# Patient Record
Sex: Male | Born: 1940 | Race: White | Hispanic: No | Marital: Married | Smoking: Current every day smoker
Health system: Southern US, Community
[De-identification: ages and names within clinical notes are randomized; demographics above are authoritative.]

## PROBLEM LIST (undated history)

## (undated) DIAGNOSIS — S2223XA Sternal manubrial dissociation, initial encounter for closed fracture: Secondary | ICD-10-CM

## (undated) DIAGNOSIS — L97519 Non-pressure chronic ulcer of other part of right foot with unspecified severity: Secondary | ICD-10-CM

## (undated) DIAGNOSIS — G629 Polyneuropathy, unspecified: Secondary | ICD-10-CM

## (undated) DIAGNOSIS — E119 Type 2 diabetes mellitus without complications: Secondary | ICD-10-CM

## (undated) DIAGNOSIS — F1020 Alcohol dependence, uncomplicated: Secondary | ICD-10-CM

## (undated) DIAGNOSIS — D709 Neutropenia, unspecified: Secondary | ICD-10-CM

## (undated) DIAGNOSIS — D649 Anemia, unspecified: Secondary | ICD-10-CM

## (undated) DIAGNOSIS — K859 Acute pancreatitis without necrosis or infection, unspecified: Secondary | ICD-10-CM

## (undated) DIAGNOSIS — I739 Peripheral vascular disease, unspecified: Secondary | ICD-10-CM

## (undated) DIAGNOSIS — E785 Hyperlipidemia, unspecified: Secondary | ICD-10-CM

## (undated) DIAGNOSIS — K219 Gastro-esophageal reflux disease without esophagitis: Secondary | ICD-10-CM

## (undated) DIAGNOSIS — K5790 Diverticulosis of intestine, part unspecified, without perforation or abscess without bleeding: Secondary | ICD-10-CM

## (undated) DIAGNOSIS — N189 Chronic kidney disease, unspecified: Secondary | ICD-10-CM

## (undated) DIAGNOSIS — Q359 Cleft palate, unspecified: Secondary | ICD-10-CM

## (undated) DIAGNOSIS — M1990 Unspecified osteoarthritis, unspecified site: Secondary | ICD-10-CM

## (undated) DIAGNOSIS — Z8719 Personal history of other diseases of the digestive system: Secondary | ICD-10-CM

## (undated) DIAGNOSIS — K279 Peptic ulcer, site unspecified, unspecified as acute or chronic, without hemorrhage or perforation: Secondary | ICD-10-CM

## (undated) DIAGNOSIS — J329 Chronic sinusitis, unspecified: Secondary | ICD-10-CM

## (undated) DIAGNOSIS — J189 Pneumonia, unspecified organism: Secondary | ICD-10-CM

## (undated) DIAGNOSIS — J449 Chronic obstructive pulmonary disease, unspecified: Secondary | ICD-10-CM

## (undated) DIAGNOSIS — I2510 Atherosclerotic heart disease of native coronary artery without angina pectoris: Secondary | ICD-10-CM

## (undated) DIAGNOSIS — I10 Essential (primary) hypertension: Secondary | ICD-10-CM

## (undated) DIAGNOSIS — I255 Ischemic cardiomyopathy: Secondary | ICD-10-CM

## (undated) HISTORY — DX: Chronic kidney disease, unspecified: N18.9

## (undated) HISTORY — PX: TYMPANOPLASTY: SHX33

## (undated) HISTORY — PX: CATARACT EXTRACTION W/ INTRAOCULAR LENS IMPLANT: SHX1309

## (undated) HISTORY — DX: Diverticulosis of intestine, part unspecified, without perforation or abscess without bleeding: K57.90

## (undated) HISTORY — PX: APPENDECTOMY: SHX54

## (undated) HISTORY — DX: Chronic sinusitis, unspecified: J32.9

## (undated) HISTORY — DX: Non-pressure chronic ulcer of other part of right foot with unspecified severity: L97.519

## (undated) HISTORY — PX: CHOLECYSTECTOMY: SHX55

## (undated) HISTORY — PX: INGUINAL HERNIA REPAIR: SUR1180

---

## 2001-11-04 HISTORY — PX: ESOPHAGOGASTRODUODENOSCOPY ENDOSCOPY: SHX5814

## 2002-01-19 ENCOUNTER — Inpatient Hospital Stay (HOSPITAL_COMMUNITY): Admission: AD | Admit: 2002-01-19 | Discharge: 2002-01-21 | Payer: Self-pay | Admitting: Cardiology

## 2002-01-19 ENCOUNTER — Encounter: Payer: Self-pay | Admitting: Cardiology

## 2002-01-21 ENCOUNTER — Encounter: Payer: Self-pay | Admitting: Cardiology

## 2002-03-24 ENCOUNTER — Ambulatory Visit (HOSPITAL_COMMUNITY): Admission: RE | Admit: 2002-03-24 | Discharge: 2002-03-24 | Payer: Self-pay | Admitting: Unknown Physician Specialty

## 2002-03-24 ENCOUNTER — Encounter: Payer: Self-pay | Admitting: Unknown Physician Specialty

## 2002-05-04 HISTORY — PX: COLONOSCOPY: SHX174

## 2002-05-12 ENCOUNTER — Ambulatory Visit (HOSPITAL_COMMUNITY): Admission: RE | Admit: 2002-05-12 | Discharge: 2002-05-12 | Payer: Self-pay | Admitting: Internal Medicine

## 2002-05-12 HISTORY — PX: COLONOSCOPY: SHX174

## 2002-05-14 ENCOUNTER — Ambulatory Visit (HOSPITAL_COMMUNITY): Admission: RE | Admit: 2002-05-14 | Discharge: 2002-05-14 | Payer: Self-pay | Admitting: Internal Medicine

## 2002-05-14 ENCOUNTER — Encounter: Payer: Self-pay | Admitting: Internal Medicine

## 2002-12-16 ENCOUNTER — Ambulatory Visit (HOSPITAL_COMMUNITY): Admission: RE | Admit: 2002-12-16 | Discharge: 2002-12-16 | Payer: Self-pay | Admitting: Unknown Physician Specialty

## 2002-12-16 ENCOUNTER — Encounter: Payer: Self-pay | Admitting: Unknown Physician Specialty

## 2003-01-10 ENCOUNTER — Emergency Department (HOSPITAL_COMMUNITY): Admission: EM | Admit: 2003-01-10 | Discharge: 2003-01-10 | Payer: Self-pay | Admitting: Emergency Medicine

## 2003-01-20 ENCOUNTER — Ambulatory Visit (HOSPITAL_COMMUNITY): Admission: RE | Admit: 2003-01-20 | Discharge: 2003-01-20 | Payer: Self-pay | Admitting: Unknown Physician Specialty

## 2003-01-20 ENCOUNTER — Encounter: Payer: Self-pay | Admitting: Unknown Physician Specialty

## 2003-01-27 ENCOUNTER — Ambulatory Visit (HOSPITAL_COMMUNITY): Admission: RE | Admit: 2003-01-27 | Discharge: 2003-01-27 | Payer: Self-pay | Admitting: Unknown Physician Specialty

## 2003-01-27 ENCOUNTER — Encounter: Payer: Self-pay | Admitting: Unknown Physician Specialty

## 2003-03-04 ENCOUNTER — Encounter: Payer: Self-pay | Admitting: Orthopaedic Surgery

## 2003-03-04 ENCOUNTER — Ambulatory Visit (HOSPITAL_COMMUNITY): Admission: RE | Admit: 2003-03-04 | Discharge: 2003-03-04 | Payer: Self-pay | Admitting: Orthopaedic Surgery

## 2003-07-08 ENCOUNTER — Emergency Department (HOSPITAL_COMMUNITY): Admission: EM | Admit: 2003-07-08 | Discharge: 2003-07-08 | Payer: Self-pay | Admitting: Emergency Medicine

## 2003-10-09 ENCOUNTER — Emergency Department (HOSPITAL_COMMUNITY): Admission: EM | Admit: 2003-10-09 | Discharge: 2003-10-09 | Payer: Self-pay | Admitting: Emergency Medicine

## 2003-10-11 ENCOUNTER — Inpatient Hospital Stay (HOSPITAL_COMMUNITY): Admission: EM | Admit: 2003-10-11 | Discharge: 2003-10-15 | Payer: Self-pay | Admitting: Emergency Medicine

## 2004-10-09 ENCOUNTER — Ambulatory Visit: Payer: Self-pay | Admitting: Family Medicine

## 2005-06-24 ENCOUNTER — Emergency Department (HOSPITAL_COMMUNITY): Admission: EM | Admit: 2005-06-24 | Discharge: 2005-06-24 | Payer: Self-pay | Admitting: Emergency Medicine

## 2005-12-16 ENCOUNTER — Ambulatory Visit: Payer: Self-pay | Admitting: Cardiology

## 2005-12-18 ENCOUNTER — Inpatient Hospital Stay (HOSPITAL_BASED_OUTPATIENT_CLINIC_OR_DEPARTMENT_OTHER): Admission: RE | Admit: 2005-12-18 | Discharge: 2005-12-18 | Payer: Self-pay | Admitting: Cardiology

## 2005-12-18 ENCOUNTER — Ambulatory Visit: Payer: Self-pay | Admitting: Cardiology

## 2005-12-25 ENCOUNTER — Ambulatory Visit (HOSPITAL_COMMUNITY): Admission: RE | Admit: 2005-12-25 | Discharge: 2005-12-26 | Payer: Self-pay | Admitting: Cardiology

## 2005-12-25 ENCOUNTER — Ambulatory Visit: Payer: Self-pay | Admitting: Cardiology

## 2006-01-01 ENCOUNTER — Ambulatory Visit: Payer: Self-pay | Admitting: Cardiology

## 2006-07-16 ENCOUNTER — Ambulatory Visit (HOSPITAL_COMMUNITY): Admission: RE | Admit: 2006-07-16 | Discharge: 2006-07-16 | Payer: Self-pay | Admitting: Ophthalmology

## 2006-09-10 ENCOUNTER — Ambulatory Visit (HOSPITAL_COMMUNITY): Admission: RE | Admit: 2006-09-10 | Discharge: 2006-09-10 | Payer: Self-pay | Admitting: Ophthalmology

## 2006-10-02 ENCOUNTER — Ambulatory Visit: Payer: Self-pay | Admitting: Cardiology

## 2006-10-30 ENCOUNTER — Ambulatory Visit: Payer: Self-pay | Admitting: Cardiology

## 2006-12-01 ENCOUNTER — Ambulatory Visit: Payer: Self-pay | Admitting: Cardiology

## 2007-01-22 ENCOUNTER — Encounter: Payer: Self-pay | Admitting: Cardiology

## 2007-05-27 ENCOUNTER — Ambulatory Visit: Payer: Self-pay | Admitting: Cardiology

## 2008-12-14 ENCOUNTER — Encounter: Payer: Self-pay | Admitting: Cardiology

## 2008-12-22 ENCOUNTER — Encounter: Payer: Self-pay | Admitting: Cardiology

## 2009-01-05 ENCOUNTER — Encounter: Payer: Self-pay | Admitting: Cardiology

## 2009-03-16 ENCOUNTER — Encounter: Payer: Self-pay | Admitting: Cardiology

## 2009-06-09 ENCOUNTER — Encounter: Payer: Self-pay | Admitting: Cardiology

## 2009-08-08 ENCOUNTER — Encounter: Payer: Self-pay | Admitting: Cardiology

## 2009-08-12 ENCOUNTER — Encounter: Payer: Self-pay | Admitting: Cardiology

## 2009-09-20 ENCOUNTER — Ambulatory Visit: Payer: Self-pay | Admitting: Cardiology

## 2009-09-21 ENCOUNTER — Inpatient Hospital Stay (HOSPITAL_COMMUNITY): Admission: EM | Admit: 2009-09-21 | Discharge: 2009-09-22 | Payer: Self-pay | Admitting: Cardiovascular Disease

## 2009-09-21 ENCOUNTER — Ambulatory Visit: Payer: Self-pay | Admitting: Cardiovascular Disease

## 2009-09-22 ENCOUNTER — Encounter: Payer: Self-pay | Admitting: Cardiovascular Disease

## 2009-10-20 ENCOUNTER — Ambulatory Visit: Payer: Self-pay | Admitting: Cardiology

## 2009-10-20 DIAGNOSIS — I714 Abdominal aortic aneurysm, without rupture, unspecified: Secondary | ICD-10-CM | POA: Insufficient documentation

## 2009-10-20 DIAGNOSIS — I718 Aortic aneurysm of unspecified site, ruptured: Secondary | ICD-10-CM

## 2009-10-20 DIAGNOSIS — Z9889 Other specified postprocedural states: Secondary | ICD-10-CM

## 2009-10-20 DIAGNOSIS — I259 Chronic ischemic heart disease, unspecified: Secondary | ICD-10-CM

## 2009-10-20 DIAGNOSIS — E1059 Type 1 diabetes mellitus with other circulatory complications: Secondary | ICD-10-CM

## 2009-10-20 DIAGNOSIS — I70219 Atherosclerosis of native arteries of extremities with intermittent claudication, unspecified extremity: Secondary | ICD-10-CM

## 2009-10-30 ENCOUNTER — Encounter: Payer: Self-pay | Admitting: Cardiology

## 2009-11-16 ENCOUNTER — Encounter: Payer: Self-pay | Admitting: Cardiology

## 2009-11-17 ENCOUNTER — Encounter (INDEPENDENT_AMBULATORY_CARE_PROVIDER_SITE_OTHER): Payer: Self-pay | Admitting: *Deleted

## 2009-12-05 ENCOUNTER — Telehealth (INDEPENDENT_AMBULATORY_CARE_PROVIDER_SITE_OTHER): Payer: Self-pay | Admitting: *Deleted

## 2009-12-12 ENCOUNTER — Encounter: Payer: Self-pay | Admitting: Cardiology

## 2010-01-18 ENCOUNTER — Encounter: Payer: Self-pay | Admitting: Cardiology

## 2010-01-18 DIAGNOSIS — E875 Hyperkalemia: Secondary | ICD-10-CM

## 2010-02-05 ENCOUNTER — Encounter: Payer: Self-pay | Admitting: Cardiology

## 2010-02-16 ENCOUNTER — Encounter (INDEPENDENT_AMBULATORY_CARE_PROVIDER_SITE_OTHER): Payer: Self-pay | Admitting: *Deleted

## 2010-04-04 HISTORY — PX: CORONARY ARTERY BYPASS GRAFT: SHX141

## 2010-04-11 ENCOUNTER — Ambulatory Visit: Payer: Self-pay | Admitting: Cardiology

## 2010-04-11 DIAGNOSIS — I739 Peripheral vascular disease, unspecified: Secondary | ICD-10-CM | POA: Insufficient documentation

## 2010-04-13 ENCOUNTER — Encounter: Payer: Self-pay | Admitting: Cardiology

## 2010-04-16 ENCOUNTER — Encounter: Payer: Self-pay | Admitting: Cardiology

## 2010-04-17 ENCOUNTER — Ambulatory Visit: Payer: Self-pay | Admitting: Cardiology

## 2010-04-17 ENCOUNTER — Inpatient Hospital Stay (HOSPITAL_COMMUNITY): Admission: EM | Admit: 2010-04-17 | Discharge: 2010-04-28 | Payer: Self-pay | Admitting: Cardiology

## 2010-04-19 ENCOUNTER — Encounter: Payer: Self-pay | Admitting: Cardiology

## 2010-04-19 ENCOUNTER — Ambulatory Visit: Payer: Self-pay | Admitting: Cardiothoracic Surgery

## 2010-04-19 ENCOUNTER — Encounter (INDEPENDENT_AMBULATORY_CARE_PROVIDER_SITE_OTHER): Payer: Self-pay | Admitting: *Deleted

## 2010-04-19 ENCOUNTER — Encounter: Payer: Self-pay | Admitting: Cardiothoracic Surgery

## 2010-04-20 ENCOUNTER — Encounter: Payer: Self-pay | Admitting: Cardiology

## 2010-04-24 ENCOUNTER — Encounter: Payer: Self-pay | Admitting: Cardiology

## 2010-05-05 ENCOUNTER — Emergency Department (HOSPITAL_COMMUNITY): Admission: EM | Admit: 2010-05-05 | Discharge: 2010-05-05 | Payer: Self-pay | Admitting: Emergency Medicine

## 2010-05-16 ENCOUNTER — Encounter: Payer: Self-pay | Admitting: Cardiology

## 2010-05-16 ENCOUNTER — Encounter: Admission: RE | Admit: 2010-05-16 | Discharge: 2010-05-16 | Payer: Self-pay | Admitting: Cardiothoracic Surgery

## 2010-05-16 ENCOUNTER — Ambulatory Visit: Payer: Self-pay | Admitting: Cardiothoracic Surgery

## 2010-05-18 ENCOUNTER — Ambulatory Visit: Payer: Self-pay | Admitting: Cardiology

## 2010-05-18 DIAGNOSIS — I2589 Other forms of chronic ischemic heart disease: Secondary | ICD-10-CM

## 2010-05-18 DIAGNOSIS — Z951 Presence of aortocoronary bypass graft: Secondary | ICD-10-CM | POA: Insufficient documentation

## 2010-05-18 DIAGNOSIS — J449 Chronic obstructive pulmonary disease, unspecified: Secondary | ICD-10-CM

## 2010-05-25 ENCOUNTER — Encounter: Payer: Self-pay | Admitting: Cardiology

## 2010-05-29 ENCOUNTER — Encounter (INDEPENDENT_AMBULATORY_CARE_PROVIDER_SITE_OTHER): Payer: Self-pay | Admitting: *Deleted

## 2010-05-30 ENCOUNTER — Ambulatory Visit: Payer: Self-pay | Admitting: Cardiothoracic Surgery

## 2010-05-30 ENCOUNTER — Encounter: Payer: Self-pay | Admitting: Cardiology

## 2010-07-04 ENCOUNTER — Telehealth (INDEPENDENT_AMBULATORY_CARE_PROVIDER_SITE_OTHER): Payer: Self-pay | Admitting: *Deleted

## 2010-08-17 ENCOUNTER — Ambulatory Visit: Payer: Self-pay | Admitting: Cardiology

## 2010-08-17 DIAGNOSIS — N62 Hypertrophy of breast: Secondary | ICD-10-CM | POA: Insufficient documentation

## 2010-08-21 ENCOUNTER — Encounter: Payer: Self-pay | Admitting: Cardiology

## 2010-12-04 NOTE — Letter (Signed)
Summary: Engineer, materials at [facility]  [address]   [place], [place] [address]   Phone: [phone]  Fax: [phone]        May 29, 2010 MRN: [id number]   Stephen [name] [address] [place], [place]  [address]   Dear [name],  Your test ordered by Selena Batten has been reviewed by your physician (or physician assistant) and was found to be normal or stable. Your physician (or physician assistant) felt no changes were needed at this time.  ____ Echocardiogram  ____ Cardiac Stress Test  __X_ Lab Work  ____ Peripheral vascular study of arms, legs or neck  ____ CT scan or X-ray  ____ Lung or Breathing test  ____ Other:   Thank you.   Hoover Brunette, LPN    Duane Boston, M.D., F.A.C.C. Thressa Sheller, M.D., F.A.C.C. Oneal Grout, M.D., F.A.C.C. Cheree Ditto, M.D., F.A.C.C. Daiva Nakayama, M.D., F.A.C.C. Kenney Houseman, M.D., F.A.C.C. Jeanne Ivan, PA-C

## 2010-12-04 NOTE — Letter (Signed)
Summary: Engineer, materials at [facility]  [address]   [place], [place] [address]   Phone: [phone]  Fax: [phone]        November 17, 2009 MRN: [id number]   KALMEN LOLLAR [address] [place], [place]  [address]   Dear Mr. Hobart,  Your test ordered by Selena Batten has been reviewed by your physician (or physician assistant) and was found to be normal or stable. Your physician (or physician assistant) felt no changes were needed at this time.  ____ Echocardiogram  ____ Cardiac Stress Test  ____ Lab Work  ____ Peripheral vascular study of arms, legs or neck  ____ CT scan or X-ray  ____ Lung or Breathing test  _X__ Other:  Abdominal Ultrasound - NO evidence of aortic aneurysm   Thank you.   Hoover Brunette, LPN    Duane Boston, M.D., F.A.C.C. Thressa Sheller, M.D., F.A.C.C. Oneal Grout, M.D., F.A.C.C. Cheree Ditto, M.D., F.A.C.C. Daiva Nakayama, M.D., F.A.C.C. Kenney Houseman, M.D., F.A.C.C. Jeanne Ivan, PA-C

## 2010-12-04 NOTE — Letter (Signed)
Summary: Generic Engineer, agricultural at [facility] [address]   [place], [place] [address]   Phone: [phone]  Fax: [phone]    04/19/2010  BRAYLYN KALTER [address] [place], [place]  [address]  Dear Mr. Bowditch,  According to our records we had you schedule for a CT scan for Thursday, April 19, 2010.  We received word from [facility] that you did not show for your appointment. We will be happy to re-schedule if you like.         Sincerely,   Zachary George Patient Care Coordinator [phone] ext [phone]

## 2010-12-04 NOTE — Progress Notes (Signed)
Summary: Breast Swelling  Phone Note Call from Patient Call back at Home Phone [phone]   Summary of Call: [name] called stating pt has swelling in his (L) breast where he had open heart surgery. [name] notified to call [facility] to discuss this problem. She verbalized understanding. Initial call taken by: Cyril Loosen, RN, BSN,  July 04, 2010 11:12 AM

## 2010-12-04 NOTE — Letter (Signed)
Summary: External Correspondence/ OFFICE VISIT [facility]  External Correspondence/ OFFICE VISIT [facility]   Imported By: Dorise Hiss 05/22/2010 15:24:28  _____________________________________________________________________  External Attachment:    Type:   Image     Comment:   External Document

## 2010-12-04 NOTE — Miscellaneous (Signed)
Summary: Orders Update - labs  Clinical Lists Changes  Problems: Added new problem of HYPERCALCEMIA (ICD-275.42) Added new problem of HYPERKALEMIA (ICD-276.7) Orders: Added new Test order of T- * Misc. Laboratory test [phone]) - Signed

## 2010-12-04 NOTE — Progress Notes (Signed)
Summary: cancelled appt. for 2/3  ---- Converted from flag ---- ---- 12/05/2009 8:40 AM, Claudette Laws wrote: PATIENT cancelled appt and refused to have lab work.York Spaniel that he does not need treatment at this time ------------------------------   I would f/u with patient and tell him severe PVD by dopplers. If he does not want to see [place] 2/3, he should still keep a f/u appointment in 6 months from now.  Lewayne Bunting, MD, [facility]  December 06, 2009 5:14 AM   Pt notified as per Dr. Earnestine Leys and verbalized understanding. Cancelled appt for tomorrow and put reminder in IDX for August. Cyril Loosen, RN, BSN  December 06, 2009 2:24 PM

## 2010-12-04 NOTE — Miscellaneous (Signed)
Summary: Orders Update  Clinical Lists Changes  Problems: Added new problem of ENCOUNTER FOR LONG-TERM USE OF OTHER MEDICATIONS (ICD-V58.69) Orders: Added new Test order of T-Creatine [phone]) - Signed Added new Test order of T-BUN [phone]) - Signed

## 2010-12-04 NOTE — Letter (Signed)
Summary: Engineer, materials at [facility]  [address]   [place], [place] [address]   Phone: [phone]  Fax: [phone]        February 16, 2010 MRN: [id number]   Stephen [name] [address] [place], [place]  [address]   Dear [name],  Your test ordered by Selena Batten has been reviewed by your physician (or physician assistant) and was found to be normal or stable. Your physician (or physician assistant) felt no changes were needed at this time.  ____ Echocardiogram  ____ Cardiac Stress Test  __X__ Lab Work  ____ Peripheral vascular study of arms, legs or neck  ____ CT scan or X-ray  ____ Lung or Breathing test  ____ Other:   Thank you.   Hoover Brunette, LPN    Duane Boston, M.D., F.A.C.C. Thressa Sheller, M.D., F.A.C.C. Oneal Grout, M.D., F.A.C.C. Cheree Ditto, M.D., F.A.C.C. Daiva Nakayama, M.D., F.A.C.C. Kenney Houseman, M.D., F.A.C.C. Jeanne Ivan, PA-C

## 2010-12-04 NOTE — Assessment & Plan Note (Signed)
Summary: 3 month fu rec reminder.   Visit Type:  Follow-up Primary Provider:  Prudy Feeler  CC:  (L) Breast pain and swelling.  History of Present Illness: the patient is a 70 year old male with a history of recent coronary bypass grafting several months ago. The patient also significant peripheral vascular disease. He has an ejection fraction of 30% with apical and inferior akinesis. He reports no recurrent chest pain. Patient also has COPD and dyslipidemia. Has claudication with ABIs of 0.59 right leg and 0.78 and the left leg. However his claudication is improved and is somewhat asymptomatic.  From cardiovascular standpoint is doing well. He denies any chest pain shortness of breath orthopnea or PND. Unfortunately he continues to smoke. He also reports painful gynecomastia in the left breast and there is associated swelling.he would like to have his Toprol switch to once a day dosing  Preventive Screening-Counseling & Management  Alcohol-Tobacco     Smoking Status: current     Packs/Day: 1.0     Year Started: 1949  Current Medications (verified): 1)  Metoprolol Succinate 50 Mg Xr24h-Tab (Metoprolol Succinate) .... Take 1 Tablet By Mouth Once A Day 2)  Nitrostat 0.4 Mg Subl (Nitroglycerin) .... Use As Directed 3)  Aspirin 325 Mg Tabs (Aspirin) .... Take 1 Tablet By Mouth Once A Day 4)  Combivent 103-18 Mcg/act Aero (Ipratropium-Albuterol) .... 2 Puffs Inhaled Two Times A Day 5)  Glucophage 1000 Mg Tabs (Metformin Hcl) .... Take 1 Tablet By Mouth Two Times A Day 6)  Glucotrol Xl 2.5 Mg Xr24h-Tab (Glipizide) .... Take 1 Tablet By Mouth Once A Day 7)  Hydrocodone-Acetaminophen 5-500 Mg Tabs (Hydrocodone-Acetaminophen) .... Take 1 Tablet By Mouth Three Times A Day As Needed 8)  Lantus 100 Unit/ml Soln (Insulin Glargine) .... Inject 30 Units Subcutaneously Once Daily As Directed 9)  Fenofibrate 160 Mg Tabs (Fenofibrate) .... Take 1 Tablet By Mouth Once A Day 10)  Lisinopril 20 Mg Tabs  (Lisinopril) .... Take 1 Tablet By Mouth Once A Day 11)  Aldactone 25 Mg Tabs (Spironolactone) .... Take 1 Tablet By Mouth Once A Day 12)  Pravachol 20 Mg Tabs (Pravastatin Sodium) .... Take 1 Tab By Mouth At Bedtime  Allergies: No Known Drug Allergies  Comments:  Nurse/Medical Assistant: The patient's medications were reviewed with the patient and were updated in the Medication List. Pt states he only takes his medications in the morning. He has only been taking metoprolol tartrate in the morning. He states it is hard to remember to take at night.  Cyril Loosen, RN, BSN (August 17, 2010 2:35 PM)  Past History:  Past Medical History: Last updated: 05/18/2010 PRIMARY FINAL DISCHARGE DIAGNOSIS:  Non-ST segment elevation myocardial infarction. Coronary bypass grafting June of 2011 LV dysfunction ejection fraction 30%   SECONDARY DIAGNOSES: 1. Chronic obstructive pulmonary disease/emphysema. 2. Ongoing tobacco use. 3. Diabetes. 4. Dyslipidemia with a total cholesterol 95, triglycerides 169, HDL     43, LDL 18 this admission. 5. Peripheral arterial disease. 6. Status post open cholecystectomy in 2004 for acute gangrenous     cholecystitis. 7. Status post percutaneous transluminal coronary angioplasty and     stent to the left anterior descending at [facility] in     1999. 8. Percutaneous transluminal coronary angioplasty and stent to the     left anterior descending  and percutaneous transluminal coronary     angioplasty and stent to the right coronary artery with high-speed     rotational atherectomy to the left  anterior descending in 2007. 9. Remote history of ethyl alcohol abuse, quit 1985. 10.Family history of coronary artery disease in his father who died at     81. Status post PCI and stent Rotablator to the LAD and distal right coronary artery. Status post PTCA with cutting balloon for in-stent restenosis in the LAD in 2 drug-eluting stent placement in the  right coronary artery. [2010]  Family History: Last updated: 10/20/2009 noncontributory  Social History: Last updated: 10/20/2009 Retired  Tobacco Use - Yes.   Risk Factors: Smoking Status: current (08/17/2010) Packs/Day: 1.0 (08/17/2010)  Social History: Packs/Day:  1.0  Review of Systems       The patient complains of shortness of breath and prolonged cough.  The patient denies fatigue, malaise, fever, weight gain/loss, vision loss, decreased hearing, hoarseness, chest pain, palpitations, wheezing, sleep apnea, coughing up blood, abdominal pain, blood in stool, nausea, vomiting, diarrhea, heartburn, incontinence, blood in urine, muscle weakness, joint pain, leg swelling, rash, skin lesions, headache, fainting, dizziness, depression, anxiety, enlarged lymph nodes, easy bruising or bleeding, and environmental allergies.    Vital Signs:  Patient profile:   70 year old male Height:      70 inches Weight:      190.50 pounds BMI:     27.43 Pulse rate:   90 / minute BP sitting:   152 / 88  (left arm) Cuff size:   regular  Vitals Entered By: Cyril Loosen, RN, BSN (August 17, 2010 2:28 PM)  Nutrition Counseling: Patient's BMI is greater than 25 and therefore counseled on weight management options. CC: (L) Breast pain and swelling Comments Follow up office visit. Pt c/o swelling and pain to his (L) breast.   Physical Exam  Additional Exam:  General: Well-developed, well-nourished in no distress head: Normocephalic and atraumatic eyes PERRLA/EOMI intact, conjunctiva and lids normal nose: No deformity or lesions mouth normal dentition, normal posterior pharynx neck: Supple, no JVD.  No masses, thyromegaly or abnormal cervical nodes lungs: Normal breath sounds bilaterally without wheezing.  Normal percussion Chest: Well-healed midline scar. heart: regular rate and rhythm with normal S1 and S2, no S3 or S4.  PMI is normal.  No pathological murmurs abdomen: Normal bowel  sounds, abdomen is soft and nontender without masses, organomegaly or hernias noted.  No hepatosplenomegaly musculoskeletal: Back normal, normal gait muscle strength and tone normal pulsus:unable to palpate pulses in the lower extremities. Extremities: No peripheral pitting edema neurologic: Alert and oriented x 3 skin: Intact without lesions or rashes cervical nodes: No significant adenopathy psychologic: Normal affect     Impression & Recommendations:  Problem # 1:  CARDIOMYOPATHY, ISCHEMIC (ICD-414.8) ejection fraction 30% but no heart failure symptoms. We will check an echocardiogram in 6 months. The following medications were removed from the medication list:    Furosemide 40 Mg Tabs (Furosemide) .[name]... Take 1 tablet by mouth two times a day as needed His updated medication list for this problem includes:    Metoprolol Succinate 50 Mg Xr24h-tab (Metoprolol succinate) .[name]... Take 1 tablet by mouth once a day    Nitrostat 0.4 Mg Subl (Nitroglycerin) ..... Use as directed    Aspirin 325 Mg Tabs (Aspirin) .[name]... Take 1 tablet by mouth once a day    Lisinopril 20 Mg Tabs (Lisinopril) .[name]... Take 1 tablet by mouth once a day    Aldactone 25 Mg Tabs (Spironolactone) .[name]... Take 1 tablet by mouth once a day  Problem # 2:  GYNECOMASTIA, UNILATERAL (ICD-611.1)  the patient has painful  gynecomastia left breast. This could be related to spironolactone and is somewhat unusual because it is unilateral. We will obtain a mammogram to make sure that there is no breast cancer.if the patient has a breast mass he will be referred for biopsy if negative then we will stop spironolactone and to eplerenone.  Orders: Misc. Referral (Misc. Ref)  Problem # 3:  CORONARY ARTERY BYPASS GRAFT, HX OF (ICD-V45.81) patient may also still have incisional chest pain from sternotomy no ischemia.  Problem # 4:  CLAUDICATION (ICD-443.9) abnormal ABIs clinical symptoms stable. Pravachol 20 mg p.o. q.h.s. will be  started.  Other Orders: EKG w/ Interpretation (93000)  Patient Instructions: 1)  Change Metoprolol to Metoprolol ER 50mg  daily 2)  Mammogram left breast - if no mass, stop Spironolactone 3)  Echo in 6 months 4)  Begin Pravachol 20mg  at bedtime  5)  Follow up in  6 months Prescriptions: PRAVACHOL 20 MG TABS (PRAVASTATIN SODIUM) Take 1 tab by mouth at bedtime  #30 x 6   Entered by:   Hoover Brunette, LPN   Authorized by:   Lewayne Bunting, MD, [facility]   Signed by:   Hoover Brunette, LPN on 16/08/9603   Method used:   Electronically to        [facility]* (retail)       [address]       [place], [place]  [address]       Ph: [phone]       Fax: [phone]   RxID:   [id number] METOPROLOL SUCCINATE 50 MG XR24H-TAB (METOPROLOL SUCCINATE) Take 1 tablet by mouth once a day  #30 x 6   Entered by:   Hoover Brunette, LPN   Authorized by:   Lewayne Bunting, MD, [facility]   Signed by:   Hoover Brunette, LPN on 08/31/2535   Method used:   Electronically to        [facility]* (retail)       [address]       [place], [place]  [address]       Ph: [phone]       Fax: [phone]   RxID:   [id number]

## 2010-12-04 NOTE — Miscellaneous (Signed)
Summary: Orders Update - bmet  Clinical Lists Changes  Orders: Added new Test order of T-Basic Metabolic Panel ([id number]) - Signed 

## 2010-12-04 NOTE — Progress Notes (Signed)
Summary: Office Visit/ TCT OFFICE VISIT  Office Visit/ TCT OFFICE VISIT   Imported By: Dorise Hiss 08/17/2010 14:13:48  _____________________________________________________________________  External Attachment:    Type:   Image     Comment:   External Document

## 2010-12-04 NOTE — Assessment & Plan Note (Signed)
Summary: EPH-POST CABBG   Visit Type:  hospital follow-up cabbg Primary Provider:  Prudy Feeler   History of Present Illness: the patient is a 70 year old male which according our disease status post recent coronary bypass grafting. The patient also has peripheral vascular disease. He has no dysfunction with an ejection fracture of 30%. There is apical and inferior akinesis. The patient presents for followup after bypass surgery. He has done well. He reports no recurrent chest pain. Of note the patient earlier this year had a cutting balloon angioplasty of in-stent restenosis in the mid LAD an attempt at percutaneous coronary intervention of a severe stenosis in the stented segment of the midright coronary artery which was unsuccessful. The patient has COPD and dyslipidemia. He also has history of claudication with ABIs of 0.59 and her right leg and .78 in the left leg. His claudication however has improved.  The patient denies orthopnea PND palpitations or syncope. He is currently not on optimal treatment for his ischemic cardiomyopathy.  Preventive Screening-Counseling & Management  Alcohol-Tobacco     Smoking Status: current     Smoking Cessation Counseling: yes     Packs/Day: 1/2 PPD  Current Medications (verified): 1)  Metoprolol Tartrate 25 Mg Tabs (Metoprolol Tartrate) .... Take 1 1/2 Tabs (37.5mg ) Two Times A Day 2)  Nitrostat 0.4 Mg Subl (Nitroglycerin) .... Use As Directed 3)  Aspirin 325 Mg Tabs (Aspirin) .... Take 1 Tablet By Mouth Once A Day 4)  Combivent 103-18 Mcg/act Aero (Ipratropium-Albuterol) .... 2 Puffs Inhaled Two Times A Day 5)  Glucophage 1000 Mg Tabs (Metformin Hcl) .... Take 1 Tablet By Mouth Two Times A Day 6)  Glucotrol Xl 2.5 Mg Xr24h-Tab (Glipizide) .... Take 1 Tablet By Mouth Once A Day 7)  Hydrocodone-Acetaminophen 7.5-750 Mg Tabs (Hydrocodone-Acetaminophen) .... Take 1 Tablet By Mouth Four Times A Day As Needed 8)  Lantus 100 Unit/ml Soln (Insulin Glargine)  .... Inject 30 Units Subcutaneously Once Daily As Directed 9)  Fenofibrate 160 Mg Tabs (Fenofibrate) .... Take 1 Tablet By Mouth Once A Day 10)  Lisinopril 20 Mg Tabs (Lisinopril) .... Take 1 Tablet By Mouth Once A Day 11)  Furosemide 40 Mg Tabs (Furosemide) .... Take 1 Tablet By Mouth Two Times A Day As Needed 12)  Aldactone 25 Mg Tabs (Spironolactone) .... Take 1 Tablet By Mouth Once A Day  Allergies (verified): No Known Drug Allergies  Comments:  Nurse/Medical Assistant: The patient's medication bottles and allergies were reviewed with the patient and were updated in the Medication and Allergy Lists.  Past History:  Past Medical History: PRIMARY FINAL DISCHARGE DIAGNOSIS:  Non-ST segment elevation myocardial infarction. Coronary bypass grafting June of 2011 LV dysfunction ejection fraction 30%   SECONDARY DIAGNOSES: 1. Chronic obstructive pulmonary disease/emphysema. 2. Ongoing tobacco use. 3. Diabetes. 4. Dyslipidemia with a total cholesterol 95, triglycerides 169, HDL     43, LDL 18 this admission. 5. Peripheral arterial disease. 6. Status post open cholecystectomy in 2004 for acute gangrenous     cholecystitis. 7. Status post percutaneous transluminal coronary angioplasty and     stent to the left anterior descending at [facility] in     1999. 8. Percutaneous transluminal coronary angioplasty and stent to the     left anterior descending  and percutaneous transluminal coronary     angioplasty and stent to the right coronary artery with high-speed     rotational atherectomy to the left anterior descending in 2007. 9. Remote history of ethyl alcohol abuse,  quit 1985. 10.Family history of coronary artery disease in his father who died at     32. Status post PCI and stent Rotablator to the LAD and distal right coronary artery. Status post PTCA with cutting balloon for in-stent restenosis in the LAD in 2 drug-eluting stent placement in the right coronary artery.  [2010]  Social History: Packs/Day:  1/2 PPD  Review of Systems       The patient complains of shortness of breath, prolonged cough, and muscle weakness.  The patient denies fatigue, malaise, fever, weight gain/loss, vision loss, decreased hearing, hoarseness, chest pain, palpitations, wheezing, sleep apnea, coughing up blood, abdominal pain, blood in stool, nausea, vomiting, diarrhea, heartburn, incontinence, blood in urine, joint pain, leg swelling, rash, skin lesions, headache, fainting, dizziness, depression, anxiety, enlarged lymph nodes, easy bruising or bleeding, and environmental allergies.    Vital Signs:  Patient profile:   70 year old male Height:      70 inches Weight:      188 pounds Pulse rate:   101 / minute BP sitting:   133 / 79  (left arm) Cuff size:   regular  Vitals Entered By: Carlye Grippe (May 18, 2010 10:13 AM)  Physical Exam  Additional Exam:  General: Well-developed, well-nourished in no distress head: Normocephalic and atraumatic eyes PERRLA/EOMI intact, conjunctiva and lids normal nose: No deformity or lesions mouth normal dentition, normal posterior pharynx neck: Supple, no JVD.  No masses, thyromegaly or abnormal cervical nodes lungs: Normal breath sounds bilaterally without wheezing.  Normal percussion Chest: Well-healed midline scar. heart: regular rate and rhythm with normal S1 and S2, no S3 or S4.  PMI is normal.  No pathological murmurs abdomen: Normal bowel sounds, abdomen is soft and nontender without masses, organomegaly or hernias noted.  No hepatosplenomegaly musculoskeletal: Back normal, normal gait muscle strength and tone normal pulsus:unable to palpate pulses in the lower extremities. Extremities: No peripheral pitting edema neurologic: Alert and oriented x 3 skin: Intact without lesions or rashes cervical nodes: No significant adenopathy psychologic: Normal affect     EKG  Procedure date:  05/18/2010  Findings:      normal  sinus rhythm. Left axis deviation cannot rule out inferior ischemia. Heart rate 93 beats per minute  Impression & Recommendations:  Problem # 1:  CLAUDICATION (ICD-443.9) no change in medical therapy will continue to follow this. The patient's claudication symptoms have actually improved.  Problem # 2:  CORONARY ARTERY BYPASS GRAFT, HX OF (ICD-V45.81) patient status post CABG. He is doing well. He has no recurrent chest pain. Continue risk factor modification Future Orders: T-Basic Metabolic Panel [id number]) ... 05/25/2010  Problem # 3:  CARDIOMYOPATHY, ISCHEMIC (ICD-414.8) the patient will be started on Aldactone 25 mg p.o. q. daily, lisinopril will be increased to 20 mg a day, Toprol needs to be increased to 37.5 mg p.o. b.i.d. and echocardiogram will also be obtained in the months for reassessment of LV function The following medications were removed from the medication list:    Plavix 75 Mg Tabs (Clopidogrel bisulfate) .[name]... Take 1 tablet by mouth once a day    Hydrochlorothiazide 25 Mg Tabs (Hydrochlorothiazide) .[name]... Take 1 tablet by mouth once daily    Imdur 30 Mg Xr24h-tab (Isosorbide mononitrate) .[name]... Take 1 tablet by mouth once a day His updated medication list for this problem includes:    Metoprolol Tartrate 25 Mg Tabs (Metoprolol tartrate) .[name]... Take 1 1/2 tabs (37.5mg ) two times a day    Nitrostat 0.4 Mg  Subl (Nitroglycerin) ..... Use as directed    Aspirin 325 Mg Tabs (Aspirin) .[name]... Take 1 tablet by mouth once a day    Lisinopril 20 Mg Tabs (Lisinopril) .[name]... Take 1 tablet by mouth once a day    Furosemide 40 Mg Tabs (Furosemide) .[name]... Take 1 tablet by mouth two times a day as needed    Aldactone 25 Mg Tabs (Spironolactone) .[name]... Take 1 tablet by mouth once a day  Problem # 4:  COPD (ICD-496) past the patient takes Combivent inhaler. His updated medication list for this problem includes:    Combivent 103-18 Mcg/act Aero (Ipratropium-albuterol) .[name]... 2 puffs inhaled  two times a day  Other Orders: EKG w/ Interpretation (93000)  Patient Instructions: 1)  Echo in 3 months before next office visit 2)  Aldactone 25mg  daily 3)  Increase Lisinopril to 20mg  daily 4)  Increase Metoprolol to 37.5mg  two times a day  5)  Need to use Combivent daily 6)  Labs in one week:  BMET 7)  Follow up in  3 months Prescriptions: ALDACTONE 25 MG TABS (SPIRONOLACTONE) Take 1 tablet by mouth once a day  #30 x 6   Entered by:   Hoover Brunette, LPN   Authorized by:   Lewayne Bunting, MD, [facility]   Signed by:   Hoover Brunette, LPN on 16/08/9603   Method used:   Electronically to        [facility]* (retail)       [address]       [place], [place]  [address]       Ph: [phone]       Fax: [phone]   RxID:   [id number] LISINOPRIL 20 MG TABS (LISINOPRIL) Take 1 tablet by mouth once a day  #30 x 6   Entered by:   Hoover Brunette, LPN   Authorized by:   Lewayne Bunting, MD, [facility]   Signed by:   Hoover Brunette, LPN on 08/31/2535   Method used:   Electronically to        [facility]* (retail)       [address]       [place], [place]  [address]       Ph: [phone]       Fax: [phone]   RxID:   [id number] METOPROLOL TARTRATE 25 MG TABS (METOPROLOL TARTRATE) take 1 1/2 tabs (37.5mg ) two times a day  #90 x 6   Entered by:   Hoover Brunette, LPN   Authorized by:   Lewayne Bunting, MD, [facility]   Signed by:   Hoover Brunette, LPN on 32/35/5732   Method used:   Electronically to        [facility]* (retail)       [address]       [place], [place]  [address]       Ph: [phone]       Fax: [phone]   RxID:   [id number]

## 2010-12-04 NOTE — Letter (Signed)
Summary: [facility] Office Visit   [facility] Office Visit   Imported By: Roderic Ovens 07/20/2010 16:15:50  _____________________________________________________________________  External Attachment:    Type:   Image     Comment:   External Document

## 2010-12-04 NOTE — Assessment & Plan Note (Signed)
Summary: 6 MONTH FU-RECV LETTER VS   Visit Type:  Follow-up Primary Provider:  Prudy Feeler   History of Present Illness: the patient is a 70 year old male with history of extensive coronary artery disease. He had a non-ST elevation microinfarction last year. He has undergone multiple prior percutaneous coronary interventions. He has diabetes mellitus and continues to smoke. The patient also severe COPD with significant wheezing on exam and he continues to smoke. He is now down to one pack a day. Of note is also that he complains of claudication in both lower extremities. Prior ABIs demonstrated an ABI of 0.59 on the right and 0.78 on the left. The patient does report occasional chest pain with a somewhat atypical although there is some exertional component. This pattern is however stable. The patient states that he cannot walk more than once a block because of significant claudication  Preventive Screening-Counseling & Management  Alcohol-Tobacco     Smoking Status: current     Packs/Day: 1.0  Comments: Decreased from 3ppd to "1 and a piece" pks per day. He smoked for about 60 yrs   Current Medications (verified): 1)  Metoprolol Succinate 50 Mg Xr24h-Tab (Metoprolol Succinate) .... Take 1 Tablet By Mouth Once A Day 2)  Nitrostat 0.4 Mg Subl (Nitroglycerin) .... Use As Directed 3)  Aspirin 325 Mg Tabs (Aspirin) .... Take 1 Tablet By Mouth Once A Day 4)  Combivent 103-18 Mcg/act Aero (Ipratropium-Albuterol) .... 2 Puffs Inhaled Two Times A Day 5)  Glucophage 1000 Mg Tabs (Metformin Hcl) .... Take 1 Tablet By Mouth Two Times A Day 6)  Glucotrol Xl 2.5 Mg Xr24h-Tab (Glipizide) .... Take 1 Tablet By Mouth Once A Day 7)  Hydrocodone-Acetaminophen 5-500 Mg Tabs (Hydrocodone-Acetaminophen) .... Take One By Mouth Three Times A Day As Needed 8)  Lantus 100 Unit/ml Soln (Insulin Glargine) .... Inject 30 Units Subcutaneously Once Daily As Directed 9)  Plavix 75 Mg Tabs (Clopidogrel Bisulfate) ....  Take 1 Tablet By Mouth Once A Day 10)  Fenofibrate 160 Mg Tabs (Fenofibrate) .... Take 1 Tablet By Mouth Once A Day 11)  Lisinopril 10 Mg Tabs (Lisinopril) .... Take 1 Tablet By Mouth Once A Day 12)  Hydrochlorothiazide 25 Mg Tabs (Hydrochlorothiazide) .... Take 1 Tablet By Mouth Once Daily 13)  Simcor 500-40 Mg Xr24h-Tab (Niacin-Simvastatin) .... Take 1 Tab By Mouth At Bedtime 14)  Imdur 30 Mg Xr24h-Tab (Isosorbide Mononitrate) .... Take 1 Tablet By Mouth Once A Day  Allergies: No Known Drug Allergies  Comments:  Nurse/Medical Assistant: The patient's medications were reviewed with the patient and were updated in the Medication List. Pt brought medication bottles to office visit.  Cyril Loosen, RN, BSN (April 11, 2010 2:39 PM)  Past History:  Family History: Last updated: 10/20/2009 noncontributory  Social History: Last updated: 10/20/2009 Retired  Tobacco Use - Yes.   Risk Factors: Smoking Status: current (04/11/2010) Packs/Day: 1.0 (04/11/2010)  Past Medical History: PRIMARY FINAL DISCHARGE DIAGNOSIS:  Non-ST segment elevation myocardial infarction.   SECONDARY DIAGNOSES: 1. Chronic obstructive pulmonary disease/emphysema. 2. Ongoing tobacco use. 3. Diabetes. 4. Dyslipidemia with a total cholesterol 95, triglycerides 169, HDL     43, LDL 18 this admission. 5. Peripheral arterial disease. 6. Status post open cholecystectomy in 2004 for acute gangrenous     cholecystitis. 7. Status post percutaneous transluminal coronary angioplasty and     stent to the left anterior descending at [facility] in     1999. 8. Percutaneous transluminal coronary angioplasty and stent  to the     left anterior descending  and percutaneous transluminal coronary     angioplasty and stent to the right coronary artery with high-speed     rotational atherectomy to the left anterior descending in 2007. 9. Remote history of ethyl alcohol abuse, quit 1985. 10.Family history of  coronary artery disease in his father who died at     50. Status post PCI and stent Rotablator to the LAD and distal right coronary artery. Status post PTCA with cutting balloon for in-stent restenosis in the LAD in 2 drug-eluting stent placement in the right coronary artery. [2010]  Social History: Packs/Day:  1.0  Review of Systems       The patient complains of fatigue, hoarseness, chest pain, shortness of breath, and prolonged cough.  The patient denies malaise, fever, weight gain/loss, vision loss, decreased hearing, palpitations, wheezing, sleep apnea, coughing up blood, abdominal pain, blood in stool, nausea, vomiting, diarrhea, heartburn, incontinence, blood in urine, muscle weakness, joint pain, leg swelling, rash, skin lesions, headache, fainting, dizziness, depression, anxiety, enlarged lymph nodes, easy bruising or bleeding, and environmental allergies.    Vital Signs:  Patient profile:   70 year old male Height:      70 inches Weight:      197.50 pounds Pulse rate:   88 / minute BP sitting:   133 / 84  (left arm) Cuff size:   regular  Vitals Entered By: Cyril Loosen, RN, BSN (April 11, 2010 2:31 PM) Comments Follow up visit. Pt states he is allergic to one of his medications. He states when he takes the meds he goes to itching. He states he ran out of some of them for about 3-4 days and didn't itch. When he restarted taking them he went back to itching. He states he used a "smoking patch" for 24 hrs about 3 wks ago. He states his chest went to hurting. He states he's been having chest pain ever since then.    Physical Exam  Additional Exam:  General: Well-developed, well-nourished in no distress head: Normocephalic and atraumatic eyes PERRLA/EOMI intact, conjunctiva and lids normal nose: No deformity or lesions mouth normal dentition, normal posterior pharynx neck: Supple, no JVD.  No masses, thyromegaly or abnormal cervical nodes lungs: Normal breath sounds bilaterally  without wheezing.  Normal percussion heart: regular rate and rhythm with normal S1 and S2, no S3 or S4.  PMI is normal.  No pathological murmurs abdomen: Normal bowel sounds, abdomen is soft and nontender without masses, organomegaly or hernias noted.  No hepatosplenomegaly musculoskeletal: Back normal, normal gait muscle strength and tone normal pulsus:unable to palpate pulses in the lower extremities. Extremities: No peripheral pitting edema neurologic: Alert and oriented x 3 skin: Intact without lesions or rashes cervical nodes: No significant adenopathy psychologic: Normal affect     EKG  Procedure date:  04/11/2010  Findings:      normal sinus rhythm. Nonspecific ST-T wave changes.  Impression & Recommendations:  Problem # 1:  CLAUDICATION (ICD-443.9) patient has known significant peripheral vascular disease by ABI. He appears to have lesions in the right femoral popliteal system as well as the tibial vessels. On the left there appears to be severe femoral popliteal disease. We will proceed with a CT angiogram of the pelvis and lower extremities to define his anatomy.  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  Problem # 3:  CORONARY ARTERY DISEASE, S/P PTCA (ICD-414.9) the patient appears have chronic stable angina. I will add Imdur 30 mg p.o. q. day to his medical regimen also a total can be increased to 50 mg p.o. q. daily. His updated medication list for this problem includes:    Metoprolol Succinate 50 Mg Xr24h-tab (Metoprolol succinate) .[name]... Take 1 tablet by mouth once a day    Nitrostat 0.4 Mg Subl (Nitroglycerin) ..... Use as directed    Aspirin 325 Mg Tabs (Aspirin) .[name]... Take 1 tablet by mouth once a day    Plavix 75 Mg Tabs (Clopidogrel bisulfate) .[name]... Take 1 tablet by mouth once a day     Lisinopril 10 Mg Tabs (Lisinopril) .[name]... Take 1 tablet by mouth once a day    Imdur 30 Mg Xr24h-tab (Isosorbide mononitrate) .[name]... Take 1 tablet by mouth once a day  Orders: EKG w/ Interpretation (93000)  Other Orders: CT Scan (CT Scan)  Patient Instructions: 1)  Imdur 30mg  daily 2)  Increase Metoprolol to 50mg  daily 3)  CTA of pelvis and lower extremities 4)  Follow up in  6 months Prescriptions: NITROSTAT 0.4 MG SUBL (NITROGLYCERIN) use as directed  #25 x 3   Entered by:   Hoover Brunette, LPN   Authorized by:   Lewayne Bunting, MD, [facility]   Signed by:   Hoover Brunette, LPN on 60/45/4098   Method used:   Electronically to        [facility]* (retail)       [address]       [place], [place]  [address]       Ph: [phone]       Fax: [phone]   RxID:   [id number] IMDUR 30 MG XR24H-TAB (ISOSORBIDE MONONITRATE) Take 1 tablet by mouth once a day  #30 x 6   Entered by:   Hoover Brunette, LPN   Authorized by:   Lewayne Bunting, MD, [facility]   Signed by:   Hoover Brunette, LPN on 53/66/4403   Method used:   Electronically to        [facility]* (retail)       [address]       [place], [place]  [address]       Ph: [phone]       Fax: [phone]   RxID:   [id number] METOPROLOL SUCCINATE 50 MG XR24H-TAB (METOPROLOL SUCCINATE) Take 1 tablet by mouth once a day  #30 x 6   Entered by:   Hoover Brunette, LPN   Authorized by:   Lewayne Bunting, MD, [facility]   Signed by:   Hoover Brunette, LPN on 73/22/0254   Method used:   Electronically to        [facility]* (retail)       [address]       [place], [place]  [address]       Ph: [phone]       Fax: [phone]   RxID:   [id number]

## 2010-12-04 NOTE — Consult Note (Signed)
[facility]: [facility]   Imported By: Roderic Ovens 05/16/2010 16:48:02  _____________________________________________________________________  External Attachment:    Type:   Image     Comment:   External Document

## 2011-01-20 LAB — URINALYSIS, ROUTINE W REFLEX MICROSCOPIC
Bilirubin Urine: NEGATIVE
Glucose, UA: NEGATIVE mg/dL
Glucose, UA: >1000 mg/dL — AB
Hgb urine dipstick: NEGATIVE
Ketones, ur: NEGATIVE mg/dL
Ketones, ur: NEGATIVE mg/dL
Leukocytes, UA: NEGATIVE
Nitrite: NEGATIVE
Protein, ur: NEGATIVE mg/dL
Specific Gravity, Urine: 1.011 (ref 1.005–1.030)
Urobilinogen, UA: 0.2 mg/dL (ref 0.0–1.0)
pH: 6.5 (ref 5.0–8.0)
pH: 7 (ref 5.0–8.0)

## 2011-01-20 LAB — CBC
HCT: 29 % — ABNORMAL LOW (ref 39.0–52.0)
HCT: 29.6 % — ABNORMAL LOW (ref 39.0–52.0)
HCT: 30 % — ABNORMAL LOW (ref 39.0–52.0)
HCT: 30.9 % — ABNORMAL LOW (ref 39.0–52.0)
HCT: 31.2 % — ABNORMAL LOW (ref 39.0–52.0)
HCT: 31.4 % — ABNORMAL LOW (ref 39.0–52.0)
HCT: 32.2 % — ABNORMAL LOW (ref 39.0–52.0)
HCT: 32.2 % — ABNORMAL LOW (ref 39.0–52.0)
HCT: 32.6 % — ABNORMAL LOW (ref 39.0–52.0)
HCT: 33.4 % — ABNORMAL LOW (ref 39.0–52.0)
Hemoglobin: 10 g/dL — ABNORMAL LOW (ref 13.0–17.0)
Hemoglobin: 10.4 g/dL — ABNORMAL LOW (ref 13.0–17.0)
Hemoglobin: 10.6 g/dL — ABNORMAL LOW (ref 13.0–17.0)
Hemoglobin: 10.8 g/dL — ABNORMAL LOW (ref 13.0–17.0)
Hemoglobin: 10.8 g/dL — ABNORMAL LOW (ref 13.0–17.0)
Hemoglobin: 10.9 g/dL — ABNORMAL LOW (ref 13.0–17.0)
Hemoglobin: 11.5 g/dL — ABNORMAL LOW (ref 13.0–17.0)
Hemoglobin: 9.9 g/dL — ABNORMAL LOW (ref 13.0–17.0)
MCHC: 33.1 g/dL (ref 30.0–36.0)
MCHC: 33.5 g/dL (ref 30.0–36.0)
MCHC: 33.7 g/dL (ref 30.0–36.0)
MCHC: 33.7 g/dL (ref 30.0–36.0)
MCHC: 33.8 g/dL (ref 30.0–36.0)
MCHC: 34.1 g/dL (ref 30.0–36.0)
MCHC: 34.2 g/dL (ref 30.0–36.0)
MCHC: 34.3 g/dL (ref 30.0–36.0)
MCHC: 34.3 g/dL (ref 30.0–36.0)
MCHC: 34.6 g/dL (ref 30.0–36.0)
MCV: 91.3 fL (ref 78.0–100.0)
MCV: 91.6 fL (ref 78.0–100.0)
MCV: 91.7 fL (ref 78.0–100.0)
MCV: 91.8 fL (ref 78.0–100.0)
MCV: 92 fL (ref 78.0–100.0)
MCV: 92.3 fL (ref 78.0–100.0)
MCV: 92.6 fL (ref 78.0–100.0)
MCV: 92.8 fL (ref 78.0–100.0)
MCV: 92.9 fL (ref 78.0–100.0)
MCV: 92.9 fL (ref 78.0–100.0)
MCV: 93.2 fL (ref 78.0–100.0)
Platelets: 176 10*3/uL (ref 150–400)
Platelets: 179 10*3/uL (ref 150–400)
Platelets: 179 10*3/uL (ref 150–400)
Platelets: 180 10*3/uL (ref 150–400)
Platelets: 202 10*3/uL (ref 150–400)
Platelets: 227 10*3/uL (ref 150–400)
Platelets: 230 10*3/uL (ref 150–400)
Platelets: 238 10*3/uL (ref 150–400)
Platelets: 238 10*3/uL (ref 150–400)
Platelets: 241 10*3/uL (ref 150–400)
RBC: 3.14 MIL/uL — ABNORMAL LOW (ref 4.22–5.81)
RBC: 3.22 MIL/uL — ABNORMAL LOW (ref 4.22–5.81)
RBC: 3.23 MIL/uL — ABNORMAL LOW (ref 4.22–5.81)
RBC: 3.37 MIL/uL — ABNORMAL LOW (ref 4.22–5.81)
RBC: 3.38 MIL/uL — ABNORMAL LOW (ref 4.22–5.81)
RBC: 3.39 MIL/uL — ABNORMAL LOW (ref 4.22–5.81)
RBC: 3.48 MIL/uL — ABNORMAL LOW (ref 4.22–5.81)
RBC: 3.5 MIL/uL — ABNORMAL LOW (ref 4.22–5.81)
RBC: 3.66 MIL/uL — ABNORMAL LOW (ref 4.22–5.81)
RDW: 14.6 % (ref 11.5–15.5)
RDW: 15.1 % (ref 11.5–15.5)
RDW: 15.3 % (ref 11.5–15.5)
RDW: 15.6 % — ABNORMAL HIGH (ref 11.5–15.5)
RDW: 15.9 % — ABNORMAL HIGH (ref 11.5–15.5)
RDW: 15.9 % — ABNORMAL HIGH (ref 11.5–15.5)
RDW: 16.1 % — ABNORMAL HIGH (ref 11.5–15.5)
RDW: 16.6 % — ABNORMAL HIGH (ref 11.5–15.5)
WBC: 10.4 10*3/uL (ref 4.0–10.5)
WBC: 4.9 10*3/uL (ref 4.0–10.5)
WBC: 5.6 10*3/uL (ref 4.0–10.5)
WBC: 6.1 10*3/uL (ref 4.0–10.5)
WBC: 6.3 10*3/uL (ref 4.0–10.5)
WBC: 6.4 10*3/uL (ref 4.0–10.5)
WBC: 6.8 10*3/uL (ref 4.0–10.5)
WBC: 7.3 10*3/uL (ref 4.0–10.5)
WBC: 8 10*3/uL (ref 4.0–10.5)
WBC: 8.1 10*3/uL (ref 4.0–10.5)
WBC: 8.4 10*3/uL (ref 4.0–10.5)
WBC: 8.5 10*3/uL (ref 4.0–10.5)

## 2011-01-20 LAB — CROSSMATCH
ABO/RH(D): A POS
Antibody Screen: NEGATIVE

## 2011-01-20 LAB — POCT I-STAT, CHEM 8
BUN: 9 mg/dL (ref 6–23)
BUN: 5 mg/dL — ABNORMAL LOW (ref 6–23)
BUN: 9 mg/dL (ref 6–23)
Calcium, Ion: 1.2 mmol/L (ref 1.12–1.32)
Calcium, Ion: 1.23 mmol/L (ref 1.12–1.32)
Chloride: 89 mEq/L — ABNORMAL LOW (ref 96–112)
Chloride: 108 mEq/L (ref 96–112)
Creatinine, Ser: 1.6 mg/dL — ABNORMAL HIGH (ref 0.4–1.5)
Creatinine, Ser: 1 mg/dL (ref 0.4–1.5)
Hemoglobin: 11.6 g/dL — ABNORMAL LOW (ref 13.0–17.0)
Potassium: 4.5 mEq/L (ref 3.5–5.1)
Sodium: 129 mEq/L — ABNORMAL LOW (ref 135–145)
Sodium: 138 mEq/L (ref 135–145)
TCO2: 23 mmol/L (ref 0–100)

## 2011-01-20 LAB — COMPREHENSIVE METABOLIC PANEL
AST: 22 U/L (ref 0–37)
Albumin: 3.7 g/dL (ref 3.5–5.2)
BUN: 12 mg/dL (ref 6–23)
Calcium: 9.7 mg/dL (ref 8.4–10.5)
Creatinine, Ser: 1.05 mg/dL (ref 0.4–1.5)
GFR calc Af Amer: >60 mL/min (ref >60)
Total Bilirubin: 0.2 mg/dL — ABNORMAL LOW (ref 0.3–1.2)
Total Protein: 6.5 g/dL (ref 6.0–8.3)

## 2011-01-20 LAB — PROTIME-INR
INR: 1.03 (ref 0.00–1.49)
INR: 1.22 (ref 0.00–1.49)
Prothrombin Time: 13.4 seconds (ref 11.6–15.2)
Prothrombin Time: 15.3 seconds — ABNORMAL HIGH (ref 11.6–15.2)

## 2011-01-20 LAB — BASIC METABOLIC PANEL
BUN: 10 mg/dL (ref 6–23)
BUN: 10 mg/dL (ref 6–23)
BUN: 11 mg/dL (ref 6–23)
BUN: 12 mg/dL (ref 6–23)
BUN: 6 mg/dL (ref 6–23)
BUN: 7 mg/dL (ref 6–23)
BUN: 7 mg/dL (ref 6–23)
CO2: 24 mEq/L (ref 19–32)
CO2: 25 mEq/L (ref 19–32)
CO2: 27 mEq/L (ref 19–32)
CO2: 29 mEq/L (ref 19–32)
CO2: 29 mEq/L (ref 19–32)
CO2: 30 mEq/L (ref 19–32)
CO2: 31 mEq/L (ref 19–32)
Calcium: 8.4 mg/dL (ref 8.4–10.5)
Calcium: 8.5 mg/dL (ref 8.4–10.5)
Calcium: 8.7 mg/dL (ref 8.4–10.5)
Calcium: 8.9 mg/dL (ref 8.4–10.5)
Calcium: 9.2 mg/dL (ref 8.4–10.5)
Calcium: 9.3 mg/dL (ref 8.4–10.5)
Chloride: 100 mEq/L (ref 96–112)
Chloride: 101 mEq/L (ref 96–112)
Chloride: 102 mEq/L (ref 96–112)
Chloride: 103 mEq/L (ref 96–112)
Chloride: 107 mEq/L (ref 96–112)
Chloride: 108 mEq/L (ref 96–112)
Chloride: 94 mEq/L — ABNORMAL LOW (ref 96–112)
Creatinine, Ser: 0.93 mg/dL (ref 0.4–1.5)
Creatinine, Ser: 1.01 mg/dL (ref 0.4–1.5)
Creatinine, Ser: 1.02 mg/dL (ref 0.4–1.5)
Creatinine, Ser: 1.02 mg/dL (ref 0.4–1.5)
Creatinine, Ser: 1.04 mg/dL (ref 0.4–1.5)
Creatinine, Ser: 1.1 mg/dL (ref 0.4–1.5)
Creatinine, Ser: 1.2 mg/dL (ref 0.4–1.5)
GFR calc Af Amer: >60 mL/min (ref >60)
GFR calc Af Amer: >60 mL/min (ref >60)
GFR calc Af Amer: >60 mL/min (ref >60)
GFR calc Af Amer: >60 mL/min (ref >60)
GFR calc Af Amer: >60 mL/min (ref >60)
GFR calc Af Amer: >60 mL/min (ref >60)
GFR calc Af Amer: >60 mL/min (ref >60)
GFR calc non Af Amer: >60 mL/min (ref >60)
GFR calc non Af Amer: >60 mL/min (ref >60)
GFR calc non Af Amer: >60 mL/min (ref >60)
GFR calc non Af Amer: >60 mL/min (ref >60)
GFR calc non Af Amer: >60 mL/min (ref >60)
GFR calc non Af Amer: >60 mL/min (ref >60)
Glucose, Bld: 100 mg/dL — ABNORMAL HIGH (ref 70–99)
Glucose, Bld: 110 mg/dL — ABNORMAL HIGH (ref 70–99)
Glucose, Bld: 114 mg/dL — ABNORMAL HIGH (ref 70–99)
Glucose, Bld: 129 mg/dL — ABNORMAL HIGH (ref 70–99)
Glucose, Bld: 130 mg/dL — ABNORMAL HIGH (ref 70–99)
Glucose, Bld: 153 mg/dL — ABNORMAL HIGH (ref 70–99)
Potassium: 3.8 mEq/L (ref 3.5–5.1)
Potassium: 3.9 mEq/L (ref 3.5–5.1)
Potassium: 3.9 mEq/L (ref 3.5–5.1)
Potassium: 4.1 mEq/L (ref 3.5–5.1)
Potassium: 4.1 mEq/L (ref 3.5–5.1)
Potassium: 4.2 mEq/L (ref 3.5–5.1)
Potassium: 4.3 mEq/L (ref 3.5–5.1)
Sodium: 134 mEq/L — ABNORMAL LOW (ref 135–145)
Sodium: 135 mEq/L (ref 135–145)
Sodium: 135 mEq/L (ref 135–145)
Sodium: 139 mEq/L (ref 135–145)
Sodium: 141 mEq/L (ref 135–145)

## 2011-01-20 LAB — BLOOD GAS, ARTERIAL
Acid-Base Excess: 2.4 mmol/L — ABNORMAL HIGH (ref 0.0–2.0)
Bicarbonate: 26.6 mEq/L — ABNORMAL HIGH (ref 20.0–24.0)
FIO2: 0.21 %
O2 Saturation: 93.1 %
Patient temperature: 98.6
TCO2: 27.9 mmol/L (ref 0–100)
pCO2 arterial: 42.2 mmHg (ref 35.0–45.0)
pH, Arterial: 7.415 (ref 7.350–7.450)
pO2, Arterial: 64.6 mmHg — ABNORMAL LOW (ref 80.0–100.0)

## 2011-01-20 LAB — POCT I-STAT 3, ART BLOOD GAS (G3+)
Acid-base deficit: 2 mmol/L (ref 0.0–2.0)
Acid-base deficit: 2 mmol/L (ref 0.0–2.0)
Acid-base deficit: 2 mmol/L (ref 0.0–2.0)
Bicarbonate: 24 mEq/L (ref 20.0–24.0)
Bicarbonate: 24.5 mEq/L — ABNORMAL HIGH (ref 20.0–24.0)
Bicarbonate: 24.6 mEq/L — ABNORMAL HIGH (ref 20.0–24.0)
O2 Saturation: 100 %
O2 Saturation: 93 %
pCO2 arterial: 43.9 mmHg (ref 35.0–45.0)
pCO2 arterial: 47.7 mmHg — ABNORMAL HIGH (ref 35.0–45.0)
pCO2 arterial: 50 mmHg — ABNORMAL HIGH (ref 35.0–45.0)
pCO2 arterial: 50.4 mmHg — ABNORMAL HIGH (ref 35.0–45.0)
pH, Arterial: 7.299 — ABNORMAL LOW (ref 7.350–7.450)
pH, Arterial: 7.312 — ABNORMAL LOW (ref 7.350–7.450)
pH, Arterial: 7.322 — ABNORMAL LOW (ref 7.350–7.450)
pO2, Arterial: 179 mmHg — ABNORMAL HIGH (ref 80.0–100.0)
pO2, Arterial: 393 mmHg — ABNORMAL HIGH (ref 80.0–100.0)
pO2, Arterial: 67 mmHg — ABNORMAL LOW (ref 80.0–100.0)
pO2, Arterial: 72 mmHg — ABNORMAL LOW (ref 80.0–100.0)

## 2011-01-20 LAB — MAGNESIUM
Magnesium: 1.8 mg/dL (ref 1.5–2.5)
Magnesium: 1.8 mg/dL (ref 1.5–2.5)
Magnesium: 2.2 mg/dL (ref 1.5–2.5)
Magnesium: 2.4 mg/dL (ref 1.5–2.5)
Magnesium: 2.8 mg/dL — ABNORMAL HIGH (ref 1.5–2.5)

## 2011-01-20 LAB — GLUCOSE, CAPILLARY
Glucose-Capillary: 115 mg/dL — ABNORMAL HIGH (ref 70–99)
Glucose-Capillary: 116 mg/dL — ABNORMAL HIGH (ref 70–99)
Glucose-Capillary: 118 mg/dL — ABNORMAL HIGH (ref 70–99)
Glucose-Capillary: 118 mg/dL — ABNORMAL HIGH (ref 70–99)
Glucose-Capillary: 119 mg/dL — ABNORMAL HIGH (ref 70–99)
Glucose-Capillary: 126 mg/dL — ABNORMAL HIGH (ref 70–99)
Glucose-Capillary: 131 mg/dL — ABNORMAL HIGH (ref 70–99)
Glucose-Capillary: 133 mg/dL — ABNORMAL HIGH (ref 70–99)
Glucose-Capillary: 135 mg/dL — ABNORMAL HIGH (ref 70–99)
Glucose-Capillary: 168 mg/dL — ABNORMAL HIGH (ref 70–99)
Glucose-Capillary: 171 mg/dL — ABNORMAL HIGH (ref 70–99)
Glucose-Capillary: 182 mg/dL — ABNORMAL HIGH (ref 70–99)
Glucose-Capillary: 186 mg/dL — ABNORMAL HIGH (ref 70–99)
Glucose-Capillary: 206 mg/dL — ABNORMAL HIGH (ref 70–99)
Glucose-Capillary: 211 mg/dL — ABNORMAL HIGH (ref 70–99)
Glucose-Capillary: 213 mg/dL — ABNORMAL HIGH (ref 70–99)
Glucose-Capillary: 252 mg/dL — ABNORMAL HIGH (ref 70–99)
Glucose-Capillary: 62 mg/dL — ABNORMAL LOW (ref 70–99)
Glucose-Capillary: 65 mg/dL — ABNORMAL LOW (ref 70–99)
Glucose-Capillary: 65 mg/dL — ABNORMAL LOW (ref 70–99)
Glucose-Capillary: 75 mg/dL (ref 70–99)
Glucose-Capillary: 79 mg/dL (ref 70–99)
Glucose-Capillary: 85 mg/dL (ref 70–99)
Glucose-Capillary: 85 mg/dL (ref 70–99)
Glucose-Capillary: 94 mg/dL (ref 70–99)
Glucose-Capillary: 98 mg/dL (ref 70–99)
Glucose-Capillary: 98 mg/dL (ref 70–99)

## 2011-01-20 LAB — CARDIAC PANEL(CRET KIN+CKTOT+MB+TROPI)
CK, MB: 10.2 ng/mL (ref 0.3–4.0)
CK, MB: 7.6 ng/mL (ref 0.3–4.0)
Relative Index: 5.5 — ABNORMAL HIGH (ref 0.0–2.5)
Relative Index: 5.9 — ABNORMAL HIGH (ref 0.0–2.5)
Total CK: 109 U/L (ref 7–232)
Total CK: 137 U/L (ref 7–232)
Troponin I: 1.06 ng/mL (ref 0.00–0.06)
Troponin I: 1.34 ng/mL (ref 0.00–0.06)

## 2011-01-20 LAB — URINE MICROSCOPIC-ADD ON

## 2011-01-20 LAB — APTT
aPTT: 35 seconds (ref 24–37)
aPTT: 99 seconds — ABNORMAL HIGH (ref 24–37)

## 2011-01-20 LAB — POCT I-STAT 4, (NA,K, GLUC, HGB,HCT)
Glucose, Bld: 118 mg/dL — ABNORMAL HIGH (ref 70–99)
Glucose, Bld: 131 mg/dL — ABNORMAL HIGH (ref 70–99)
Glucose, Bld: 135 mg/dL — ABNORMAL HIGH (ref 70–99)
Glucose, Bld: 89 mg/dL (ref 70–99)
HCT: 26 % — ABNORMAL LOW (ref 39.0–52.0)
HCT: 27 % — ABNORMAL LOW (ref 39.0–52.0)
HCT: 34 % — ABNORMAL LOW (ref 39.0–52.0)
Hemoglobin: 10.2 g/dL — ABNORMAL LOW (ref 13.0–17.0)
Hemoglobin: 11.6 g/dL — ABNORMAL LOW (ref 13.0–17.0)
Hemoglobin: 9.2 g/dL — ABNORMAL LOW (ref 13.0–17.0)
Potassium: 4 mEq/L (ref 3.5–5.1)
Potassium: 4.8 mEq/L (ref 3.5–5.1)
Sodium: 137 mEq/L (ref 135–145)
Sodium: 141 mEq/L (ref 135–145)

## 2011-01-20 LAB — LIPID PANEL
Cholesterol: 145 mg/dL (ref 0–200)
HDL: 57 mg/dL (ref >39)
LDL Cholesterol: 43 mg/dL (ref 0–99)
Total CHOL/HDL Ratio: 2.5 RATIO

## 2011-01-20 LAB — HEMOGLOBIN A1C: Mean Plasma Glucose: 180 mg/dL — ABNORMAL HIGH (ref <117)

## 2011-01-20 LAB — MRSA PCR SCREENING: MRSA by PCR: NEGATIVE

## 2011-01-20 LAB — HEPARIN LEVEL (UNFRACTIONATED)
Heparin Unfractionated: 0.14 IU/mL — ABNORMAL LOW (ref 0.30–0.70)
Heparin Unfractionated: 0.26 IU/mL — ABNORMAL LOW (ref 0.30–0.70)
Heparin Unfractionated: 0.43 IU/mL (ref 0.30–0.70)
Heparin Unfractionated: 0.43 IU/mL (ref 0.30–0.70)

## 2011-01-20 LAB — CREATININE, SERUM
Creatinine, Ser: 0.84 mg/dL (ref 0.4–1.5)
GFR calc Af Amer: >60 mL/min (ref >60)
GFR calc non Af Amer: >60 mL/min (ref >60)

## 2011-01-20 LAB — ABO/RH: ABO/RH(D): A POS

## 2011-01-20 LAB — HEMOGLOBIN AND HEMATOCRIT, BLOOD: HCT: 29 % — ABNORMAL LOW (ref 39.0–52.0)

## 2011-01-20 LAB — URINE CULTURE
Colony Count: NO GROWTH
Culture: NO GROWTH

## 2011-01-30 ENCOUNTER — Emergency Department (HOSPITAL_COMMUNITY)
Admission: EM | Admit: 2011-01-30 | Discharge: 2011-01-30 | Disposition: A | Discharge status: Home / Self Care | Payer: Medicare Other | Attending: Emergency Medicine | Admitting: Emergency Medicine

## 2011-01-30 ENCOUNTER — Emergency Department (HOSPITAL_COMMUNITY): Payer: Medicare Other

## 2011-01-30 DIAGNOSIS — J4489 Other specified chronic obstructive pulmonary disease: Secondary | ICD-10-CM | POA: Insufficient documentation

## 2011-01-30 DIAGNOSIS — Z79899 Other long term (current) drug therapy: Secondary | ICD-10-CM | POA: Insufficient documentation

## 2011-01-30 DIAGNOSIS — I10 Essential (primary) hypertension: Secondary | ICD-10-CM | POA: Insufficient documentation

## 2011-01-30 DIAGNOSIS — E119 Type 2 diabetes mellitus without complications: Secondary | ICD-10-CM | POA: Insufficient documentation

## 2011-01-30 DIAGNOSIS — R109 Unspecified abdominal pain: Secondary | ICD-10-CM | POA: Insufficient documentation

## 2011-01-30 DIAGNOSIS — K859 Acute pancreatitis without necrosis or infection, unspecified: Secondary | ICD-10-CM | POA: Insufficient documentation

## 2011-01-30 DIAGNOSIS — I2510 Atherosclerotic heart disease of native coronary artery without angina pectoris: Secondary | ICD-10-CM | POA: Insufficient documentation

## 2011-01-30 DIAGNOSIS — J449 Chronic obstructive pulmonary disease, unspecified: Secondary | ICD-10-CM | POA: Insufficient documentation

## 2011-01-30 LAB — COMPREHENSIVE METABOLIC PANEL
ALT: 10 U/L (ref 0–53)
AST: 13 U/L (ref 0–37)
Alkaline Phosphatase: 42 U/L (ref 39–117)
CO2: 25 mEq/L (ref 19–32)
Calcium: 10.3 mg/dL (ref 8.4–10.5)
GFR calc Af Amer: >60 mL/min (ref >60)
GFR calc non Af Amer: >60 mL/min (ref >60)
Glucose, Bld: 160 mg/dL — ABNORMAL HIGH (ref 70–99)
Potassium: 4.2 mEq/L (ref 3.5–5.1)
Sodium: 131 mEq/L — ABNORMAL LOW (ref 135–145)
Total Protein: 7.1 g/dL (ref 6.0–8.3)

## 2011-01-30 LAB — URINALYSIS, ROUTINE W REFLEX MICROSCOPIC
Bilirubin Urine: NEGATIVE
Glucose, UA: NEGATIVE mg/dL
Ketones, ur: NEGATIVE mg/dL
Protein, ur: 30 mg/dL — AB
pH: 5 (ref 5.0–8.0)

## 2011-01-30 LAB — DIFFERENTIAL
Basophils Absolute: 0 10*3/uL (ref 0.0–0.1)
Basophils Relative: 0 % (ref 0–1)
Lymphocytes Relative: 14 % (ref 12–46)
Monocytes Absolute: 1 10*3/uL (ref 0.1–1.0)
Neutro Abs: 7.1 10*3/uL (ref 1.7–7.7)
Neutrophils Relative %: 74 % (ref 43–77)

## 2011-01-30 LAB — CBC
HCT: 41.9 % (ref 39.0–52.0)
Hemoglobin: 14.3 g/dL (ref 13.0–17.0)
MCHC: 34.1 g/dL (ref 30.0–36.0)
WBC: 9.6 10*3/uL (ref 4.0–10.5)

## 2011-01-30 LAB — LIPASE, BLOOD: Lipase: 1089 U/L — ABNORMAL HIGH (ref 11–59)

## 2011-01-30 LAB — URINE MICROSCOPIC-ADD ON

## 2011-02-05 ENCOUNTER — Other Ambulatory Visit: Payer: Self-pay | Admitting: Cardiothoracic Surgery

## 2011-02-05 DIAGNOSIS — I2510 Atherosclerotic heart disease of native coronary artery without angina pectoris: Secondary | ICD-10-CM

## 2011-02-06 ENCOUNTER — Ambulatory Visit
Admission: RE | Admit: 2011-02-06 | Discharge: 2011-02-06 | Disposition: A | Discharge status: Home / Self Care | Payer: Medicare Other | Source: Ambulatory Visit | Attending: Cardiothoracic Surgery | Admitting: Cardiothoracic Surgery

## 2011-02-06 ENCOUNTER — Ambulatory Visit (INDEPENDENT_AMBULATORY_CARE_PROVIDER_SITE_OTHER): Payer: Medicare Other | Admitting: Cardiothoracic Surgery

## 2011-02-06 DIAGNOSIS — I2510 Atherosclerotic heart disease of native coronary artery without angina pectoris: Secondary | ICD-10-CM

## 2011-02-06 DIAGNOSIS — K439 Ventral hernia without obstruction or gangrene: Secondary | ICD-10-CM

## 2011-02-06 LAB — GLUCOSE, CAPILLARY: Glucose-Capillary: 145 mg/dL — ABNORMAL HIGH (ref 70–99)

## 2011-02-06 LAB — URINALYSIS, ROUTINE W REFLEX MICROSCOPIC
Ketones, ur: NEGATIVE mg/dL
Nitrite: NEGATIVE
Protein, ur: NEGATIVE mg/dL
Urobilinogen, UA: 0.2 mg/dL (ref 0.0–1.0)

## 2011-02-06 LAB — CBC
Hemoglobin: 10.8 g/dL — ABNORMAL LOW (ref 13.0–17.0)
MCHC: 33.8 g/dL (ref 30.0–36.0)
MCV: 91.5 fL (ref 78.0–100.0)
RDW: 13.6 % (ref 11.5–15.5)

## 2011-02-06 LAB — BASIC METABOLIC PANEL
Calcium: 9.2 mg/dL (ref 8.4–10.5)
Creatinine, Ser: 0.93 mg/dL (ref 0.4–1.5)
GFR calc Af Amer: >60 mL/min (ref >60)
GFR calc non Af Amer: >60 mL/min (ref >60)
Sodium: 135 mEq/L (ref 135–145)

## 2011-02-06 LAB — CARDIAC PANEL(CRET KIN+CKTOT+MB+TROPI)
Relative Index: INVALID (ref 0.0–2.5)
Total CK: 75 U/L (ref 7–232)

## 2011-02-06 LAB — LIPID PANEL
LDL Cholesterol: 18 mg/dL (ref 0–99)
VLDL: 34 mg/dL (ref 0–40)

## 2011-02-06 LAB — HEPARIN LEVEL (UNFRACTIONATED): Heparin Unfractionated: 0.42 IU/mL (ref 0.30–0.70)

## 2011-02-07 NOTE — Assessment & Plan Note (Addendum)
OFFICE VISIT  Stephen [name], Stephen [name] DOB:  1940/11/24                                        February 06, 2011 CHART #:  [id number]  CURRENT PROBLEMS: 1. Status post coronary artery bypass grafting x3 in June 2011, for     subendocardial myocardial infarction. 2. Diabetes. 3. Active smoking.  PRESENT ILLNESS:  The patient is a 70 year old gentleman who returns to office due to concern over a bulge in the lower aspect of his sternal incision.  He had the above multivessel bypass grafting and had a noneventful recovery in mid 2011.  He states the bulge in his lower incision does not hurt and has not progressed.  He presents for an evaluation.  CURRENT MEDICATIONS:  He is unsure on the last visit he was on multiple medications including glipizide, Glucophage, Lantus insulin, lisinopril, Neurontin, TriCor and Vytorin.  PHYSICAL EXAMINATION:  Vital Signs:  Blood pressure 130/70, pulse of 90 and regular, saturation 94%.  General:  He is alert and pleasant. Lungs:  Breath sounds are clear and equal.  The sternum is stable although.  Abdomen:  In the subxiphoid epigastrium, there is a small incisional hernia which admits almost two fingers.  It is nontender and easily reduced and not impressive.  IMAGING:  His chest x-ray shows some chronic bronchitic changes but no pleural effusions, sternal wire is intact.  The cardiac silhouette is stable.  IMPRESSION AND PLAN:  The patient has a small ventral hernia which probably came on from coughing related to his chronic obstructive pulmonary disease.  I reassured him that this should not cause any major problem and we will follow up with a followup exam in approximately 6 months.  If the area becomes painful or changes in size, he will let us know in the interim.  No prescriptions were provided this office visit. He was encouraged to completely stop smoking at this visit.  Kerin Perna, M.D. Electronically  Signed  PV/MEDQ  D:  02/06/2011  T:  02/07/2011  Job:  [id number]  cc:   Prudy Feeler, PA-C Learta Codding, MD,FACC

## 2011-03-19 NOTE — Assessment & Plan Note (Signed)
OFFICE VISIT   Stephen, [name]  DOB:  1941/08/15                                        May 16, 2010  CHART #:  [id number]   CURRENT PROBLEMS:  1. Status post coronary artery bypass graft x3, April 23, 2010, for      unstable angina and subendocardial myocardial infarction.  2. Diabetes mellitus.  3. Chronic obstructive pulmonary disease with active smoking.  4. Previous percutaneous coronary intervention with a left anterior      descending stent having high-grade stenosis.   PRESENT ILLNESS:  The patient returns to the office for his first postop  visit following multivessel bypass grafting.  He presented with unstable  angina positive for cardiac enzymes.  The cardiac catheterization  demonstrated in-stent stenosis of the LAD with three-vessel disease.  He  underwent left IMA grafting to his LAD and vein grafts to the ramus  intermediate and posterolateral branch of the right coronary which were  small diabetic vessels.  His EF was 30%.  He had an uneventful recovery  and was discharged home on aspirin, metoprolol 25 b.i.d., Combivent  inhaler, glipizide, Glucophage, Lantus, lisinopril 10 mg, Neurontin,  Symbicort, TriCor, Vytorin, and pain medication.  He has had no  recurrent angina.  The surgical incisions are healing fairly well for  his diabetes.  He has had no symptoms of CHF or swelling.  He continues  to smoke at least 5 cigarettes a day.   PHYSICAL EXAMINATION:  Blood pressure 108/60, pulse 110 and sinus tach,  respirations 18, saturation 94% on room air.  He is alert and  comfortable.  Breath sounds are diminished but clear.  His sternum is  stable and healing.  There is no evidence of cellulitis.  One of the  chest tube sites is debrided of a necrotic eschar and a Neosporin Band-  Aid dressing is applied.  His heart rhythm is regular.  There is no  murmur, rub, or gallop.  The leg incision is healing, and he has no  significant pedal  edema.   DIAGNOSTIC TESTS:  A PA and lateral chest x-ray shows COPD, no  significant pleural effusion or pulmonary edema.  The sternal wires are  intact and well aligned.   IMPRESSION AND PLAN:  The patient has done well now 3 weeks following  surgery.  He is told he could resume driving and light activity.  We  will arrange for him to be seen in the [place] office in [place].  The  patient mentioned that he is having bladder outlet obstruction problems  and urologist at [facility], Dr. Jerre Simon, is  scheduling a possible transurethral prostatectomy.  I would recommend  that the patient wait another month and have this procedure performed in  September unless it becomes an extreme urgent situation.  Because of his  tachyrhythmia, I have increased his beta-blocker to Lopressor 25 t.i.d.  I have also given a refill for Vicodin pain medication.  Plan on seeing  him back in 2 weeks to review the situation.   Kerin Perna, M.D.  Electronically Signed   PV/MEDQ  D:  05/16/2010  T:  05/17/2010  Job:  [id number]   cc:   [facility], [facility].  Ky Barban, M.D.

## 2011-03-19 NOTE — Assessment & Plan Note (Signed)
OFFICE VISIT   Stephen, [name]  DOB:  1941/01/28                                        May 30, 2010  CHART #:  [id number]   CURRENT PROBLEMS:  1. Status post coronary artery bypass graft x3, April 23, 2010, for non-      ST elevation myocardial infarction.  2. Active smoking.  3. Diabetes, insulin dependent.  4. Peripheral vascular disease with ankle-brachial indices of 0.6-0.7.   PRESENT ILLNESS:  The patient is now almost 1 month after multivessel  bypass grafting.  He returns for his final surgical visit.  He is still  having some musculoskeletal pain over his chest, relieved by Vicodin.  He is walking much better now and with less shortness of breath  following his bypass surgery.  The surgical incisions are now healing.  At the last visit, his chest tube site had a mild cellulitis and this  has responded to topical wound care and Neosporin.  Unfortunately, he  still smokes at least five cigarettes a day.   PHYSICAL EXAMINATION:  Blood pressure 120/70, pulse 90 and regular,  respirations 18, saturation 96%.  He appears to be very well today  without complaint.  Breath sounds with scattered rhonchi but equal  bilaterally.  The sternum is stable and well healed.  He has some mild  tenderness over the left anterior chest wall.  Cardiac rhythm is regular  without S3, gallop, murmur, or rub.  Leg incision is healed and there is  no peripheral edema.   IMPRESSION AND PLAN:  The patient is doing well and is told he could  carry normal activities with exception of heavy lifting more than 20  pounds, which will need to wait until mid September.  He was given  another prescription for Vicodin for musculoskeletal pain as well as a  backup for tramadol for less severe pain.  He will return to the care of  his cardiologist in [place] and return here as needed.   Kerin Perna, M.D.  Electronically Signed   PV/MEDQ  D:  05/30/2010  T:  05/31/2010  Job:   [id number]   cc:   Learta Codding, MD,FACC

## 2011-03-19 NOTE — Assessment & Plan Note (Signed)
[facility]                          [facility] OFFICE NOTE   KAVAUGHN, FAUCETT                        MRN:          [id number]  DATE:05/27/2007                            DOB:          03-14-1941    REFERRING PHYSICIAN:  Prudy Feeler, M.D.   HISTORY OF PRESENT ILLNESS:  The patient is a 70 year old male with a  history of coronary artery disease status post PCI and stent with  Rotablator therapy to the LAD and distal right coronary artery. The  patient has been doing well. He reports a recurrence of old chest pain.  He continues to wheeze, but has cut back on his smoking and he is now  down to one pack per day. He denies any shortness of breath at rest. He  has no palpitations or syncope. The patient states that he uses an  inhaler. Please note that it is unclear on which inhaler he exactly  uses. He is apparently compliant with the remainder of his medications.  He was recently hospitalized for pneumonia and also had a staph  infection of the feet. The patient did have prior arterial Dopplers on  which showed no significant abnormalities of the ABIs bilaterally.   MEDICATIONS:  1. Glipizide ER 2.5 mg daily.  2. Isosorbide mononitrate 30 mg daily.  3. Lopressor 25 mg daily.  4. Plavix 75 mg daily.  5. Phenofibrate.  6. Niaspan.  7. Neurontin.  8. Metformin.  9. Lantus insulin.   PHYSICAL EXAMINATION:  VITAL SIGNS:  Blood pressure 157/85, heart rate  80 beats-per-minute, weight 194 pounds.  NECK:  Normal carotid upstrokes. No carotid bruits.  LUNGS:  Diminished breath sound bilaterally with wheezes.  HEART:  Regular rate and rhythm, normal S1, S2, no murmurs, rubs, or  gallops.  ABDOMEN:  Soft and nontender.  EXTREMITIES:  No cyanosis, clubbing, or edema.  NEUROLOGIC:  The patient is alert, oriented, and grossly non-focal.   PROBLEM LIST:  1. Dyspnea with wheezing secondary to chronic obstructive pulmonary      disease.  2. Tobacco  abuse.  3. Coronary artery disease.      a.     Status post PCI and stent to the LAD and ostial right       coronary artery.      b.     Mild ischemia in the apex and inferior wall.  4. No recurrence of substernal chest pain.  5. Normal LV function.  6. Diabetes mellitus.  7. Rule out full neuropathy.  8. Negative ABI.   PLAN:  1. The patient continues to have wheezing and dyspnea. We will add      Advair discus to his medical regimen, but I want him to see the      pulmonologist, Dr. Orson Aloe, in follow up.  2. From a cardiovascular perspective, the patient is stable and no      further cardiovascular testing is needed. The patient had a      Cardiolite study in December 2007.     Learta Codding, MD,FACC  Electronically Signed    GED/MedQ  DD: 05/27/2007  DT: 05/28/2007  Job #: [id number]   cc:   Prudy Feeler, M.D.

## 2011-03-22 NOTE — Consult Note (Signed)
NAME:  Stephen [name], Stephen [name] NO.:  [id number]   MEDICAL RECORD NO.:  [id number]                   PATIENT TYPE:  EMS   LOCATION:  ED                                   FACILITY:  [facility]   PHYSICIAN:  Nicholas Lose, N.P.           DATE OF BIRTH:  Oct 09, 1941   DATE OF CONSULTATION:  10/11/2003  DATE OF DISCHARGE:                                   CONSULTATION   CHIEF COMPLAINT:  Pancreatitis.   HISTORY OF PRESENT ILLNESS:  Stephen [name] is a 70 year old Caucasian male  who presents to our office  with four days of persistent epigastric pain  that radiates to his back.  Stephen [name] has been followed by R. Roetta Sessions,  M.D. 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  Last visit was with Dr. Jena Gauss on August 22, 2003.  At that  point in time, Stephen [name] appeared to be doing clinically well, although he  had partaken in further alcohol.  Today, he denies any recent alcohol use.  He is also having nausea as well as emesis, including three to four episodes  last night.  He currently is not on any pain medication.  He was seen in the  emergency department on October 09, 2003.  At that point in time, he was  found to have slight leukocytosis with WBC's of 12.6, glucose in his urine  as well as hyperglycemia with glucose of 318.  LFT's, amylase and lipase  were all normal.  A two view chest x-ray was obtained which revealed  bronchitic changes, a benign gas pattern and chronic calcific pancreatitis.  Stephen [name] stopped his pancreatic enzymes with limitation in October as well.  Also, he was noted to have an elevated CA19-9 and was scheduled for EUF at  [facility].  He reportedly became frustrated due to significant wait time.  He was  to have an MRI and MRA.  However, apparently, he did not follow up with  these studies as well.   CURRENT MEDICATIONS:  1. Glucovance 5/500 mg two tablets in the a.m., one in the p.m.  2. Crestor 2 mg  daily.   ALLERGIES:  No known drug allergies.   PHYSICAL EXAMINATION:  GENERAL:  Stephen [name] is a 53 year old Caucasian  male, who appears thin, dry and slightly icteric.  Weight 158 pounds.  VITAL SIGNS:  Temperature 97.0, blood pressure 110/80, pulse 136.  HEENT:  Sclerae clear, slightly icteric.  Oropharynx pink and dry with  brownish discoloration of dorsum of tongue.  NECK:  Supple without any mass or thyromegaly.  CHEST:  Tachycardia with heart rate in 130's.  LUNGS:  Emphysematous changes throughout.  ABDOMEN:  Positive bowel sounds x4, soft.  There is a significant amount of  guarding.  No palpable hepatosplenomegaly or mass.  EXTREMITIES:  2+ pedal pulses bilaterally.  No edema.   ASSESSMENT:  Mr.  Stephen [name] is a 39 year old Caucasian male with what  appears to be exacerbation of pancreatitis; given his boring epigastric pain  as well as leukocytosis and hyperglycemia.  He is also having nausea and  vomiting which is making it difficult for him to take his oral  hyperglycemics as well.  I have discussed this case with Dr. Jena Gauss who feels  that Stephen [name] needs to be admitted to the hospital for fluid rehydration,  pain control and further workup.  Dr. Jena Gauss has asked me to contact  emergency room admitting physicians, which I have done.  Stephen [name] will be  evaluated in the emergency room and possibly admitted by a Hospitalist or we  can consult Mr. Fikes.   RECOMMENDATIONS:  1. Immediately to the emergency room.  2. Further interventions pending workup.      ___________________________________________                                            Nicholas Lose, N.P.   KC/MEDQ  D:  10/11/2003  T:  10/11/2003  Job:  [id number]   cc:   Colon Flattery, MD  [address]  [place]  [place] [address]  Fax: [phone]

## 2011-03-22 NOTE — Discharge Summary (Signed)
NAME:  Stephen [name], Stephen [name]                 ACCOUNT NO.:  [id number]   MEDICAL RECORD NO.:  [id number]          PATIENT TYPE:  OIB   LOCATION:  6527                         FACILITY:  [facility]   PHYSICIAN:  Charlies Constable, M.D. [facility] DATE OF BIRTH:  1940/12/14   DATE OF ADMISSION:  12/25/2005  DATE OF DISCHARGE:  12/26/2005                                 DISCHARGE SUMMARY   PROCEDURES PERFORMED DURING THIS HOSPITALIZATION:  Include a high-speed  rotational atherectomy plus stent to the left anterior descending and stent  to the ostium of the right coronary artery on December 25, 2005.   DISCHARGE DIAGNOSIS:  Coronary artery disease.   SECONDARY DIAGNOSES:  1.  Coronary artery disease status post stenting of left anterior descending      in 1999 with a bare-metal stent.  2.  Hypertension.  3.  Hyperlipidemia.  4.  Type 2 diabetes mellitus.  5.  Ongoing tobacco abuse.  6.  Remote alcoholism. Patient was a heavy drinker previously and quit on      January 08, 1984.   BRIEF HISTORY OF PRESENT ILLNESS:  [name] is a 70 year old Caucasian male  with a previous history of coronary artery disease who had presented for a  PCI after being seen by Dr. Andee Lineman on February 12 secondary to episodes of  left chest and shoulder burning and pressure. The patient had undergone a  cardiac catheterization on February 14 that revealed a significant LAD and  ostial RCA lesion. The patient had presented on February 21 for PCI of his  LAD and possible ostial RCA.   HOSPITAL COURSE:  The patient was admitted on December 25, 2005. Dr. Juanda Chance  performed a cardiac catheterization for PCI and stenting of his LAD,  stenting of his ostium of his right coronary artery with 2 drug alluding  stents. The patient was seen on December 26, 2005 and had no complaints of  chest pain, groin without bruit or hematoma. Dr. Juanda Chance decided patient was  stable to be discharged home, will be on life-long Plavix. The patient was  also seen  for smoking consult during his hospitalization.   Laboratories show a white blood cell count of 5.1, hemoglobin of 15.1 and  hematocrit of 44.8, a platelet count of 233. His sodium was 137, potassium  4.3, glucose of 341, BUN of 6, creatinine of 0.9. Of note, Troponin slightly  elevated at 0.21 status post jugular venous stent placement.   The patient will be discharged on the following medications:  1.  Aspirin 325 mg q.d.  2.  Zetia 10 mg q.d.  3.  Crestor 10 mg q.d.  4.  Lopressor 25 mg b.i.d.  5.  Captopril 6.25 mg q.8h.  6.  Plavix 75 mg q.d.  7.  Actos 45 mg q.d.  8.  Metformin. Patient has to wait 3 days to restart.   He will follow up with Dr. Andee Lineman on January 01, 2006 at 9 o'clock as  previously scheduled. The patient is to follow a low cholesterol, low fat  diet. Also was given a wound sheet, to call  if he has any additional  problems. The duration of DC encounter was less than 30 minutes PA and MD  time.     ______________________________  April Humphrey, NP    ______________________________  Charlies Constable, M.D. [facility]    AH/MEDQ  D:  12/26/2005  T:  12/27/2005  Job:  [id number]   cc:   Learta Codding, M.D. [facility]  [address]  [address]  [place]  [place] [address]

## 2011-03-22 NOTE — Cardiovascular Report (Signed)
NAME:  Stephen [name], Stephen [name] NO.:  [id number]   MEDICAL RECORD NO.:  [id number]          PATIENT TYPE:  OIB   LOCATION:  1966                         FACILITY:  [facility]   PHYSICIAN:  Charlies Constable, M.D. [facility] DATE OF BIRTH:  1941/06/02   DATE OF PROCEDURE:  12/18/2005  DATE OF DISCHARGE:  12/18/2005                              CARDIAC CATHETERIZATION   CLINICAL HISTORY:  Mr. Ternes is 70 years old and had stenting of the LAD at  [facility] in 1999.  He had catheterization by Dr. Riley Kill in 2003,  which showed only nonobstructive disease.  He had a Cardiolite in 2005 which  had a moderate inferior defect but was felt to be low risk and no  catheterization was done.  Over the last three weeks, he developed recurrent  exertional chest pain and was seen by Dr. Andee Lineman who arranged for him to  come in the hospital for evaluation and angiography.  His other problems  include diabetes and continued tobacco use.   PROCEDURE:  The procedure was performed via the right femoral arteries and  arterial sheath and 4 French preformed coronary catheters.  A front wall  arterial puncture was performed and Omnipaque contrast was used.  We had  difficulty selectively engaging the right coronary artery due to damping of  the catheter and tried several catheters but had to settle for a  subselective injection.  Patient tolerated the procedure well and left the  laboratory in satisfactory condition.   RESULTS:  The aortic pressure was 115/55 with a mean of 78 and a left  ventricular pressure of 115/9.   Left main coronary artery:  The left main coronary artery was free of  significant disease.   Left anterior descending coronary artery:  The left anterior descending  coronary artery gave rise to a large diagonal branch, two septal  perforators, a small diagonal branch and another small septal perforator.  There was 80 to 90% stenosis in the proximal LAD before a stent in the mid  LAD.   This lesion had moderately heavy calcification.  There was 30%  narrowing within the stent in the mid LAD which was diffuse narrowing.   Circumflex coronary artery:  The circumflex coronary artery gave rise to a  ramus branch and AV branch which terminated into two posterolateral  branches.  There was 70% narrowing in the mid circumflex artery.   Right coronary artery:  The right coronary artery is a moderately large  vessel that gave rise to two right ventricular branches, a posterior  descending branch and three posterolateral branches.  This vessel was  injected subselectively because of damping of the diagnostic catheter.  On  the subselective injection it appeared there was a 90% stenosis  just near  the ostium.  There was moderate calcification.   Left ventriculogram:  The left ventriculogram performed in the RAO  projection showed good wall motion with no areas of hypokinesis.  The  estimated ejection fraction was 70%.   CONCLUSION:  Coronary artery disease status post prior stenting of the mid  left anterior descending  with 80 to 90% stenosis in the proximal left  anterior descending, 30% narrowing within the stent in the mid left anterior  descending, 70% narrowing in the mid circumflex artery and questionable 90%  stenosis in the proximal right coronary artery with normal left ventricular  function.   RECOMMENDATIONS:  The patient has recent onset angina.  Has significant two-  vessel coronary artery disease.  Will plan to bring the patient back next  week for percutaneous coronary intervention on the LAD.  We may want to use  rotational atherectomy because of the heavy calcification.  Will take some  further diagnostic pictures of the right coronary artery and this will  probably also require intervention.           ______________________________  Charlies Constable, M.D. [facility]     BB/MEDQ  D:  12/18/2005  T:  12/18/2005  Job:  [id number]   cc:   Learta Codding, M.D. [facility]  [address]  [address]  [place]  [place] [address]   Cardiopulmonary Lab

## 2011-03-22 NOTE — Cardiovascular Report (Signed)
NAME:  Stephen [name], Stephen [name] NO.:  [id number]   MEDICAL RECORD NO.:  [id number]          PATIENT TYPE:  OIB   LOCATION:  6527                         FACILITY:  [facility]   PHYSICIAN:  Charlies Constable, M.D. [facility] DATE OF BIRTH:  1941-04-22   DATE OF PROCEDURE:  DATE OF DISCHARGE:  12/26/2005                              CARDIAC CATHETERIZATION   PAST MEDICAL HISTORY:  Mr. Lamm is 70 years old and had stenting of the LAD  at [facility] in 1999 and had a catheterization done here by Dr.  Riley Kill in 2003 which showed nonobstructive disease. He recently developed  symptoms of exertional chest pain and was seen by Dr. Andee Lineman and I performed  a catheterization in the outpatient laboratory which showed a 90% stenosis  of the proximal LAD with heavy calcification and 90% ostial stenosis in the  right coronary artery with 70% stenosis in the circumflex artery and good LV  function. We brought him back today for intervention on the LAD and right  coronary arteries.   PROCEDURE:  The procedure was performed of the right femoral artery using an  arterial sheath and 7 French Q4 guiding catheter with side holes. The  patient was given Angiomax bolus and infusion and was enrolled in a Lawrence  trial and was randomized to either have Plavix load of 600 mg or Champion  study drug. We first passed a Rotafloppy wire down the LAD without  difficulty. We then used a 1.75 bur and performed 4 runs at approximately  155,000 rpm for approximately 10 seconds each. We then deployed a 2.5 x 23  mm Cypher stent, employing this with 1 inflation at 14 atmospheres for 30  seconds. We post dilated with 2.75 x 20 mm Quantum Maverick performing 2  inflations up to 6.   We used a JR-4 #6 [place] guiding catheter with side holes and a ProWater  wire and crossed the ostial lesion of the right coronary with the wire  without difficulty. We predilated with a 2.5 x 12 mm Maverick performing 2  inflations up to  10 atmospheres for 30 seconds. We then employed a 3.0 x 8  mm Cypher stent being careful to place the proximal edge of the stent just  outside the ostium. We employed this with 1 inflation of 15 atmospheres for  30 seconds. We then post dilated with a 3.5 x 8 mm Quantum Maverick and  performed 2 inflations up to 16 atmospheres for 30 seconds. Final diagnostic  studies were then performed through the guiding catheter. The patient  tolerated the procedure well and left the laboratory in satisfactory  condition.   RESULTS:  The lesion of the left anterior descending was initially 90% and  was long and segmental and heavily calcified. Following stenting this  improved to 10%.   The lesion in the right coronary was initially 90% at the ostium of the  right coronary artery. Following stenting this improved to 10%.   CONCLUSION:  1.  Successful PCI of the calcified lesion of proximal left anterior      descending using  rotational atherectomy and a Cypher drug-eluting stent      with improvement in the central narrowing from 90% to 10%.  2.  Successful PCI of the ostial lesion in the right coronary artery using a      Cypher drug-eluting stent with improvement in the central narrowing from      90% to 10%.   DISPOSITION:  The patient returned to __________ for further observation.   I felt it was important to use drug-eluting stents in this situation because  of the high risk of recurrence with these 2 locations and with the size and  length of the lesion in the LAD. We will need to make strong efforts to keep  the patient on Plavix for at least a year.           ______________________________  Charlies Constable, M.D. [facility]     BB/MEDQ  D:  12/25/2005  T:  12/26/2005  Job:  [id number]   cc:   Learta Codding, M.D. [facility]  [address]  [address]  [place]  [place] [address]   Jonelle Sidle, M.D. [facility]  [address]   [place]  [place] [address]

## 2011-03-22 NOTE — H&P (Signed)
NAME:  Stephen [name], Stephen [name]                 ACCOUNT NO.:  [id number]   MEDICAL RECORD NO.:  [id number]          PATIENT TYPE:  OIB   LOCATION:  2899                         FACILITY:  [facility]   PHYSICIAN:  Charlies Constable, M.D. [facility] DATE OF BIRTH:  08/26/1941   DATE OF ADMISSION:  12/25/2005  DATE OF DISCHARGE:                                HISTORY & PHYSICAL   PRIMARY CARDIOLOGIST:  Dr. Lewayne Bunting in [place].   PATIENT PROFILE:  A 70 year old white male with prior history of CAD who  presents for PCI.   PROBLEM LIST:  1.  CAD.      1.  Status post stenting of the LAD in 1999 with a bare metal stent.      2.  Repeat catheterization 2003 showing moderate nonobstructive coronary          disease.      3.  Cardiolite study performed March 2005 showing moderate inferior          defect and continued on medical therapy.      4.  December 18, 2005, cardiac catheterization showing 80-90% stenosis          in the proximal LAD with a 30% stenosis in previously-placed LAD          stent; 70% stenosis in the mid left circumflex; and an ostial 90%          calcified stenosis in the RCA. EF was 70%.  2.  Hypertension.  3.  Hyperlipidemia.  4.  Type 2 diabetes mellitus.  5.  Ongoing tobacco abuse. The patient has smoked 57 years, between two and      three packs a day and is currently smoking two-and-a-half packs a day.  6.  Remote alcoholism. The patient previously was a heavy drinker, quitting      January 08, 1984.   HISTORY OF PRESENT ILLNESS:  A 69 year old white male with the above problem  list who was recently seen in clinic by Dr. Andee Lineman February 12 secondary to  episodes of left chest and shoulder burning and pressure that were worse  with exertion. Underwent cardiac catheterization February 14 revealing  significant LAD and ostial RCA disease. He represents today for PCI of the  LAD and possibly of the ostial RCA. Since his catheterization on February 14  he has had several episodes of left chest  pressure and shoulder aching  occurring at rest and resolving spontaneously or with nitroglycerin.   ALLERGIES:  No known drug allergies.   CURRENT MEDICATIONS:  1.  Aspirin 81 mg daily.  2.  Actos 45 mg daily.  3.  Zetia 10 mg daily.  4.  Crestor 10 mg daily.  5.  Metformin b.i.d. - dose unknown.  6.  Hydrochlorothiazide b.i.d. - dose not known.  7.  Plavix 75 mg daily.   FAMILY HISTORY:  His father died of an MI at age 69. Mother died of an MI at  age 96. He had seven brothers, three of which are deceased - one from liver  CA, one from cirrhosis of the liver, he is  not sure about the other one. He  has three sisters who are alive and well. There is no CAD in his siblings.   SOCIAL HISTORY:  He lives in [place] with his wife and two sons. He retired  early secondary to health concerns and previously worked as a Architect. He smoked for 57 years, anywhere between two and three  packs per day and is currently smoking two-and-a-half packs per day. He  previously used heavy alcohol but quit in March 1985.   REVIEW OF SYSTEMS:  Positive for chest pressure, polyuria and urgency. All  other systems reviewed and negative.   PHYSICAL EXAMINATION:  VITAL SIGNS:  Temperature 97.8, heart rate 92,  respirations 18, blood pressure 174/79, pulse oximetry 92% on room air. He  is 5 feet 11 inches and weighs 188 pounds.  GENERAL:  Pleasant white male in no acute distress. Awake, alert, and  oriented x3.  NECK:  Normal carotid upstrokes. No bruits or JVD.  LUNGS:  Respirations regular and unlabored with scattered rhonchi and  diminished breath sounds throughout, as well as some anterior wheezing.  CARDIAC:  Regular, S1, S2. He has distant heart sounds. There is no S3, S4,  or murmurs.  ABDOMEN:  Round, soft, nondistended, nontender. Bowel sounds present x4.  EXTREMITIES:  Warm, dry, pink. No clubbing, cyanosis, or edema. Dorsalis  pedis and posterior tibial pulses 1+ and  equal bilaterally. The right groin  site which was used for catheterization is clear of bleeding, bruising,  hematoma.   ACCESSORY CLINICAL FINDINGS:  None.   ASSESSMENT AND PLAN:  1.  Coronary artery disease. The patient presents today for PCI and stenting      of the LAD and possibly of the RCA. He will remain on aspirin, statin,      Plavix therapy.  2.  Hypertension. Blood pressure is not well controlled. He has listed that      he is on hydrochlorothiazide 100 mg b.i.d.; however, I will ask if this      is accurate as that seems like an unusually high dose. Given his      diabetes, he would likely benefit from addition of an ACE inhibitor.  3.  Hyperlipidemia. Continue Crestor and Zetia therapy.  4.  Type 2 diabetes mellitus. His oral agents are currently on hold. He will      need sliding scale insulin while he is in-house.  5.  Tobacco abuse. Smoking cessation strongly advised.      Ok Anis, NP    ______________________________  Charlies Constable, M.D. [facility]    CRB/MEDQ  D:  12/25/2005  T:  12/25/2005  Job:  [id number]

## 2011-03-22 NOTE — Op Note (Signed)
NAME:  Stephen [name], Stephen [name]                 ACCOUNT NO.:  [id number]   MEDICAL RECORD NO.:  [id number]          PATIENT TYPE:  AMB   LOCATION:  DAY                           FACILITY:  [facility]   PHYSICIAN:  Susanne Greenhouse, MD       DATE OF BIRTH:  [birth year]   DATE OF PROCEDURE:  07/16/2006  DATE OF DISCHARGE:  07/16/2006                                 OPERATIVE REPORT   PREOPERATIVE DIAGNOSIS:  Nuclear and posterior polar cataracts, right eye.   POSTOPERATIVE DIAGNOSIS:  Nuclear and posterior polar cataracts, right eye.   PROCEDURE:  Phacoemulsification, posterior chamber intraocular lens  implantation, right eye.   SURGEON:  Bonne Dolores. Alto Denver, M.D.   ANESTHESIA:  Topical with monitored anesthesia care.   DESCRIPTION OF PROCEDURE:  In the preoperative holding area, dilating drops  and 2% viscous lidocaine were placed into the right eye.  The patient was  then brought to the operating room where the eye was prepped and draped in  the usual sterile manner.   A Supersharp blade was used to make a paracentesis port at the surgeon's 2  o'clock position.  The anterior chamber was filled with 1% nonpreserved  lidocaine solution followed by Amvisc Plus.  A 2.85-mm keratome blade was  used to make a clear corneal incision at the superotemporal limbus.  A  cystotome needle and Utrata forceps were used to create a continuous tear  capsulotomy.  Hydrodissection was performed using balanced salt solution on  a fine cannula.  The lens nucleus was then removed using phacoemulsification  with a quadrant cracking technique.  Residual cortex was removed with  irrigation and aspiration.  The capsular bag and anterior chamber were  refilled with Amvisc Plus.  The posterior chamber intraocular lens was  placed into the capsular bag using a Monarch lens injecting system.  The  Amvisc Plus was then removed from the capsular bag and anterior chamber with  irrigation and aspiration.  Stromal hydration of the main  incision and  paracentesis ports were performed with balanced salt solution on a fine  cannula.  The wound was tested for leak which was negative.  No sutures were  placed.  There were no operative complications.   The patient tolerated the procedure well and was returned to the recovery  area in satisfactory condition.   PROSTHETIC DEVICE:  Alcon AcrySof posterior chamber intraocular lens, model  #SN60WF, power of 27.0, serial #[id number].          ______________________________  Susanne Greenhouse, MD    KEH/MEDQ  D:  07/18/2006  T:  07/19/2006  Job:  [id number]

## 2011-03-22 NOTE — Op Note (Signed)
NAME:  Stephen [name], Stephen [name]                           ACCOUNT NO.:  [id number]   MEDICAL RECORD NO.:  [id number]                   PATIENT TYPE:  INP   LOCATION:  A216                                 FACILITY:  [facility]   PHYSICIAN:  Barbaraann Barthel, M.D.              DATE OF BIRTH:  [birth year]   DATE OF PROCEDURE:  10/13/2003  DATE OF DISCHARGE:                                 OPERATIVE REPORT   PREOPERATIVE DIAGNOSIS:  Acute cholecystitis.   POSTOPERATIVE DIAGNOSIS:  Acute gangrenous cholecystitis.   PROCEDURE:  Attempted laparoscopic cholecystectomy with conversion to open  cholecystectomy.   SURGEON:  Marlane Hatcher, M.D.   SPECIMEN:  Gallbladder.   NOTE:  This is a 70 year old male who had long-term complaints of right  upper quadrant discomfort.  He was seen in the emergency room for these  findings.  He was found to have on sonography, changes consistent with acute  cholecystitis.  He was taken to surgery after medical consultation as this  patient had a significant history of coronary artery disease, COPD, as well  as type 2 diabetes.  After his medical status was stabilized he was taken to  surgery.  We had discussed with him and his son, preoperatively, the  complications of the surgery, not limited to but including:  Bleeding,  infection, damage to bile ducts, perforation of organs and transitory  diarrhea; as well as the possibility that this surgery may be converted to  an open cholecystectomy.  All these points were addressed and all questions  answered.  Informed consent was obtained.   GROSS OPERATIVE FINDINGS:  Those consistent with acute gangrenous  cholecystitis. The patient had a small cystic duct, very friable.  No  attempt was made at a cholangiogram due to the inflammatory process.  The  right upper quadrant, otherwise appeared to be normal except for obvious,  very marked pericholecystic inflammation.   TECHNIQUE:  The patient was placed in the supine  position and after the  adequate administration of general anesthesia by endotracheal intubation and  the placement of a Foley catheter aseptically; the patient was prepped with  Betadine solution and draped in the usual manner.  A periumbilical incision  was carried out over the superior aspect of the umbilicus and then we  grasped the fascia and elevated this and inserted a Veress needle which was  confirmed in position with a saline drop test and then insufflated the  abdomen with approximately 3.5 liters of CO2.  We then placed an 11-mm [place]  cannula using the Vis-A-Port technique in the umbilicus and then under  direct vision placed three other cannulas; an 11-mm cannula in the  epigastrium, and two 5-mm cannulas in right upper quadrant laterally.  I  attempted to take down the adhesions about the gallbladder and this was  clearly not going to be possible and after 8 minutes we converted to an open  cholecystectomy.   We then made a subcostal incision from the midline to below the subcostal  margins.  This was a generous incision to allow plenty of exposure due to  the inflammation that we knew was present.  We divided the rectus muscle  with the cautery device.  We grasped the gallbladder.  We had to take 60 cc  out of it with a puncture and then with careful dissection from the fundus  down towards the cystic duct we dissected the gallbladder free ligating the  cystic duct triply and clipping it close to the gallbladder side and  removing the gallbladder.  No attempt at a cholangiogram was made because of  the friability of the tissues.  The wound was then irrigated copiously with  normal saline solution and the liver bed was cauterized with the cautery  device and I elected to leave a Jackson-Pratt drain in the liver bed.   We then closed the peritoneum with a running #0 Polysorb suture and the  fascia with figure-of-eight interrupted sutures and closed the skin with a  stapling  device.  The other areas where the cannulas were placed were  likewise closed with the stapling device using #0 Polysorb to close the area  where the camera was placed.   All skin incisions were closed with a stapling device and we used one of the  5 mm cannula sites laterally to exit the Jackson-Pratt drain and this was  sutured in place with 3-0 Nylon.  Prior to closure, all sponge, needle, and  instrument counts were found to be correct.  Estimated blood loss was  minimal.  The patient received a liter of crystalloids intraoperatively.  There were no complications.      ___________________________________________                                            Barbaraann Barthel, M.D.   WB/MEDQ  D:  10/13/2003  T:  10/13/2003  Job:  [id number]   cc:   R. Roetta Sessions, M.D.  [address]  [place]  [place] [address]  Fax: [phone]   Vania Rea, M.D.   [facility]

## 2011-03-22 NOTE — Discharge Summary (Signed)
NAME:  Stephen [name], Stephen [name]                           ACCOUNT NO.:  [id number]   MEDICAL RECORD NO.:  [id number]                   PATIENT TYPE:  INP   LOCATION:  A216                                 FACILITY:  [facility]   PHYSICIAN:  Barbaraann Barthel, M.D.              DATE OF BIRTH:  Jul 20, 1941   DATE OF ADMISSION:  10/11/2003  DATE OF DISCHARGE:  10/15/2003                                 DISCHARGE SUMMARY   FINAL DIAGNOSIS:  1. Acute gangrenous cholecystitis.   SECONDARY DIAGNOSES:  1. Diabetes type 2.  2. History of chronic pancreatitis.  3. Coronary artery disease.  4. Chronic obstructive pulmonary disease.  5. Hypertension.   PROCEDURE:  On October 13, 2003, attempted laparoscopic cholecystectomy with  conversion to open cholecystectomy.   HISTORY:  This is a 70 year old man who had long-term complaints of right  upper quadrant discomfort.  He was seen in the emergency room and he was  found to have on sonography, changes consistent with acute cholecystitis.  He was taken to surgery after medical consultation, as this patient had a  significant history of coronary artery disease, COPD, as well as type 2  diabetes mellitus.  After his medical status was stabilized, he was taken to  surgery and we found acute gangrenous cholecystitis.  The patient had a  small cystic duct that was very friable.  No cholangiogram was performed.  The patient's right upper quadrant, otherwise, intraoperatively appeared  normal except for the obvious pericholecystic inflammation.  Surgery took  place on October 13, 2003, despite the fact that we were unable to perform a  laparoscopic procedure.  He otherwise, did well and was discharged on the  second postoperative day.  At the time of his discharge, he was tolerating  liquids and soft diet well.  His wound was clean without evidence of  infection.  His blood sugar had been controlled as well as his hypertension  and he developed no breathing problems.   At the time, his wound was clean,  he had no dysuria and no shortness of breath.   LABORATORY DATA:  Sonogram showed signs consistent with cholecystitis.  On  October 11, 2003, his liver function studies were grossly within normal  limits.  Amylase was not elevated.  Metabolic-7 was grossly within normal  limits, mild hyponatremia.  BUN 11, creatinine 1.1.  He was admitted with a  white count of 17.8 on October 11, 2003 and on October 14, 2003, his white  count was down to 9.9 with an H&H of 11.2 and 34.0.   For his status via his coronary artery disease, and hypertension, please  consult the medical consultants note.  The patient also had a CT scan with  contrast that was done on October 11, 2003 which again, showed signs  consistent with acute cholecystitis and chronic calcific pancreatitis.  His  chest x-ray showed no active lung disease and his  sonogram as mentioned  above.  His electrocardiogram showed sinus tachycardia, left anterior  fascicular block.  He developed no undue shortness of breath or chest pain  postoperatively.   DISPOSITION:  As he was doing well and his hospital course was uneventful,  he was discharged on October 15, 2003.   DISCHARGE INSTRUCTIONS:  Discharge instructions include that he was excused  from work.  He was discharged on a full liquid and soft diet.  He is  permitted to shower, walk up and down the stairs.  He is told to do no  sexual activity, no driving, no heavy lifting immediate postoperative.  He  is told to clean his wound with alcohol three times a day.   DISCHARGE MEDICATIONS:  1. Metoprolol 25 mg p.o. b.i.d.  2. Altace 2.5 mg daily.  3. Nicotine patch 21 mcg patch daily.  4. Darvocet-N 100 one tablet q.4h. for pain.  5. Cipro 500 mg one tablet q.12h.  6. Told to resume his diabetic medicines and other medications as     preoperatively.  He is told to hold his aspirin products immediate     postoperative.   FOLLOWUP:  We will follow  him postoperatively, at which time he is told to  return to his regular cardiologist for followup and follow up with Dr. Jena Gauss  who sees him apparently for his chronic pancreatitis problems.     ___________________________________________                                         Barbaraann Barthel, M.D.   WB/MEDQ  D:  11/07/2003  T:  11/07/2003  Job:  [id number]   cc:   R. Roetta Sessions, M.D.  [address]  [place]  [place] [address]  Fax: [phone]   Colon Flattery, MD  [address]  [place]  [place] [address]  Fax: [phone]   [facility]

## 2011-03-22 NOTE — Assessment & Plan Note (Signed)
[facility]                          [facility] OFFICE NOTE   Stephen [name], Stephen [name]                        MRN:          [id number]  DATE:10/02/2006                            DOB:          1940/11/06    REFERRING PHYSICIAN:  Prudy Feeler, P.A.   HISTORY OF PRESENT ILLNESS:  The patient is a 70 year old male with a  history of coronary artery disease status post PCI and stent with  Rotablator therapy to the LAD and ostial right coronary artery stent.  The patient has been doing well. He has had no recurrence of chest pain.  He is short of breath on moderate exertion and unfortunately continues  to smoke. He actually relates to me that he smokes 14 cartons of  cigarettes a month. Nevertheless, he states that he is doing quite well,  and he feels like he can still jump over a bridge. He denies any  palpitations or syncope. He has been screened for peripheral vascular  disease with ABIs which are essentially within normal limits but due  point towards some small vessel disease. Next, the patient states that  on occasion he has some wheezing.   MEDICATIONS:  1. Crestor 10 mg a day.  2. Glipizide ER 2.5 mg a day.  3. Isosorbide mononitrate 30 mg a day.  4. Lopressor 25 daily.  5. Metformin 1000 mg p.o. b.i.d.  6. Plavix 75 mg a day.  7. Fenofibrate 160 mg p.o. daily.  8. Neurontin 300 mg p.o. b.i.d.  9. Niaspan 500 two tablets every night.  10.Lantus insulin 15 units daily.   PHYSICAL EXAMINATION:  VITAL SIGNS:  Blood pressure 133/80, heart rate  77 beats per minute.  NECK:  Normal carotid upstrokes. No carotid bruits.  LUNGS:  Bilateral wheezes with prolonged experium  HEART:  Regular rate and rhythm. Normal S1 and S2.  ABDOMEN:  Soft.  EXTREMITIES:  No edema.   PROBLEM LIST:  1. Dyspnea secondary to presumed underlying lung disease.      a.     Wheezing on exam today.  2. Coronary artery disease. Status post percutaneous coronary  intervention with stents to the proximal left anterior descending      artery and ostial right coronary artery.      a.     Normal left ventricular function.      b.     History of stent to the left anterior descending artery in       1999.  3. Tobacco use.  4. Diabetes mellitus.  5. 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  6. No definite claudication with negative ABIs.   PLAN:  1. The patient can continue on aspirin and Plavix.  2. I have made no change in the patient's antianginal regimen as this      appears to be stable. We will need to do a stress test, and the      patient wants to have this delayed until after Christmas.  3. I counseled the patient extensively about his tobacco use, and he      states that he  will try to cut back down on smoking.  4. I will plan to see him back in 6 months.     Learta Codding, MD,FACC  Electronically Signed    GED/MedQ  DD: 10/02/2006  DT: 10/03/2006  Job #: [id number]   cc:   Prudy Feeler, P.A.

## 2011-03-22 NOTE — Consult Note (Signed)
NAME:  Stephen [name], Stephen [name] NO.:  [id number]   MEDICAL RECORD NO.:  [id number]                   PATIENT TYPE:  INP   LOCATION:  A216                                 FACILITY:  [facility]   PHYSICIAN:  Vania Rea, M.D.              DATE OF BIRTH:  06-16-1941   DATE OF CONSULTATION:  10/12/2003  DATE OF DISCHARGE:                                   CONSULTATION   PRIMARY CARE PHYSICIAN:  Dr. Colon Flattery.   REFERRING PHYSICIAN:  Dr. Barbaraann Barthel.   CONSULTING PHYSICIAN:  Dr. Vania Rea.   REASON FOR CONSULT:  Elderly man with acute cholecystitis and multiple  medical problems.  Hospitalist service asked to assist with management.   HISTORY OF PRESENT ILLNESS:  This is a 70 year old Caucasian gentleman with  a history of ETOH abuse and tobacco abuse who has been having abdominal pain  and nausea for the past four days, progressing to severe vomiting over the  past 24 hours.  The patient was referred to the emergency room by Dr. Jena Gauss  where a CT scan of the abdomen and an ultrasound of the abdomen confirmed  acalculous cholecystitis.  The patient is admitted with presumed date for  surgery on Thursday, October 13, 2003.   PAST MEDICAL HISTORY:  1. COPD.  2. Tobacco abuse.  3. Coronary artery disease status post stent at [facility] in 1999 and     repeat stent of the RCA at [facility] last year.  4. GERD.  5. Diabetes mellitus.   MEDICATIONS AT HOME:  1. Nexium 40 mg daily.  2. Glucovance 5/500 two tablets in the morning, one tablet in the evening.  3. Advair one puff daily.  4. Another inhaler p.r.n.  5. Crestor 20 mg daily.  6. Aspirin 81 mg daily.  7. Lactulose daily at bedtime - last used on Saturday.  8. Nitroglycerin sublingual - last used two to three months ago.   ALLERGIES:  No known drug allergies.   SOCIAL AND FAMILY HISTORY:  The patient has been married for 39 years, five  children.  He has six brothers and three  sisters.  His father died of an MI  at age 68.  His mother died of an MI at age 89.  Two sisters and one brother  have hypertension and diabetes.  One of the diabetic sisters also has  confirmed coronary artery disease.  One of his sons has bronchitis.  The  patient smokes two packs of cigarettes per day for the past 53 years and  used to be a heavy drinker of moonshine until 13 years ago when he gave it  up.  The last alcohol use one-and-a-half months ago.  He denies use of  illicit drugs.   REVIEW OF SYMPTOMS:  Significant for occasional cough productive of a white  sputum, orthopnea but no PND, an alleged ability to walk one  to five miles  without stopping and apparently without chest pain although he does get  short of breath.  The patient says he last walked one mile yesterday without  chest pain.  However, he does occasionally get chest pain going up hills  relieved by using sublingual nitroglycerin; last nitroglycerin use two to  three months ago.  Occasionally has swelling of his feet.  Has never been  diagnosed with respiratory failure requiring intubation.  Does not use  nebulization at home, uses only inhalers.  Denies fever.   Does not see the ophthalmologist regularly.  Does not see a podiatrist  regularly.  Has been diagnosed with peripheral neuropathy secondary to his  diabetes but is on no treatment for it currently.   PHYSICAL EXAMINATION:  GENERAL:  This is a thin, pleasant, elderly Caucasian  man with marked respiratory effort.  VITAL SIGNS:  Temperature 98.1, pulse 122, respirations 20, blood pressure  119/71.  HEENT:  He is pink, anicteric, mildly dehydrated.  His pupils are equal and  reactive.  CHEST:  Thin barreled chest, prolonged expiration, diffuse wheezing.  CARDIOVASCULAR:  Regular, tachycardic, no murmurs.  ABDOMEN:  Tender in the right upper quadrant but normal abdominal bowel  sounds, no masses felt.  EXTREMITIES:  There is no edema.  His dorsalis  pedis is 3+ on the right and  2+ on the left.  NEUROLOGIC:  He is alert and oriented x2.  There is no lateralizing deficit  but there is impaired sensation in the feet.   LABORATORY DATA:  His hemoglobin is 14.5, hematocrit 43.3, his white count  is 17.8 with 89% neutrophils.  Sodium is 132, his potassium 5.2 earlier  today, chloride 92, CO2 26, BUN 11, creatinine 1.1, glucose 399, calcium  9.7.  His liver function tests are normal with a total protein of 7.5 and an  albumin of 3.7.  His INR is 1.0, his PTT is 33.  Urinalysis shows glucose  over 1000, ketones 80, specific gravity 1.010 - all else negative.  Amylase  and lipase are normal.  His abdominal ultrasound shows a distended,  thickened gallbladder with area of cholecystic fluid.  Abdominal CT shows  chronic calcifications in the pancreas.  The chest x-ray shows no acute lung  disease, bronchitic changes.  The EKG shows an anterior infarct.   ASSESSMENT:  1. Chronic obstructive pulmonary disease with significant airway     obstruction.  2. Tobacco abuse.  3. Diabetes mellitus, uncontrolled.  4. History of hyperlipidemia.  5. 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  6. Chronic pancreatitis.  7. Acute cholecystitis.   RECOMMENDATIONS:  1. ABG on room air to assess for CO2 retention or significant hypoxia.  2. Nicoderm patch.  3. Agree with discontinuing metformin and starting a sliding scale insulin     for tight glucose control.  Would also get a hemoglobin A1c.  4. Consider a beta-lactamase inhibitor as an antibiotic for his     cholecystitis.  5. Agree with cardiology consult.   Thank you for allowing [place] to participate in the care of this gentleman.      ___________________________________________                                            Vania Rea, M.D.  LC/MEDQ  D:  10/12/2003  T:  10/12/2003  Job:  [id number]

## 2011-03-22 NOTE — Assessment & Plan Note (Signed)
[facility]                          [facility] OFFICE NOTE   Stephen [name], Stephen [name]                        MRN:          [id number]  DATE:12/01/2006                            DOB:          May 19, 1941    REFERRING PHYSICIAN:  Prudy Feeler, Dr.   HISTORY OF PRESENT ILLNESS:  The patient is a 70 year old male with a  history of coronary artery disease status post PCI and stent with  Rotablator therapy to the LAD and ostial right coronary artery stent.  Patient has been doing well.  He reported a single episode one month ago  after a stress test of substernal chest pain, this lasted a few seconds  when bending forward.  However, he has had no exertional chest pain or  dyspnea.  Unfortunately, he continues to smoke heavily.  A Cardiolite  stress study was performed and this showed an ejection fraction of 61%  with mild apical and inferior defects with partial reversibility,  consistent with scar and felt only to have a very mild period of peri-  infarct ischemia.  The patient denies currently any nausea or vomiting.  He has no orthopnea or PND, no palpitations or syncope.   MEDICATIONS:  1. Crestor 10 mg p.o. daily.  2. Glipizide ER 2.5 mg p.o. daily.  3. Isosorbide mononitrate 30 mg p.o. daily.  4. Lopressor 25 mg p.o. daily.  5. Increase Imdur to 60.  6. Lopressor 25 mg p.o. daily.  7. Plavix 75 mg p.o. daily.  8. Fenofibrate 160 mg p.o. daily.  9. Neurontin 300 mg p.o. b.i.d.  10.Niaspan 500 mg, two tablets q.h.s.  11.Lantus insulin.  12.Metformin 1000 mg p.o. daily.   PHYSICAL EXAMINATION:  VITAL SIGNS:  Blood pressure 158/88, heart rate  84 b.p.m.  GENERAL:  A well-nourished white male in no apparent distress.  HEENT:  Pupils, eyes equal, conjunctiva clear.  NECK:  Supple with normal carotid upstroke, no carotid bruits.  LUNGS:  Clear breath sounds bilaterally.  HEART:  Regular rate and rhythm with normal S1, S2.  No murmur, rubs or  gallops.  ABDOMEN:  Soft.  EXTREMITY:  No cyanosis, clubbing or edema.  NEURO:  The patient is alert, oriented, grossly nonfocal.   PROBLEM LIST:  1. Dyspnea but resolved wheezing.  2. Coronary artery disease.      a.     Status post percutaneous coronary intervention and stent to       the left anterior descending and ostial right coronary artery.      b.     Mild ischemia in the apex and inferior wall.  3. No recurrent chest pain.  4. Normal left ventricular function.  5. Tobacco use.  6. Diabetes mellitus.  7. Questionable peripheral neuropathy.  8. Negative ABI.   PLAN:  1. The patient had a single episode of substernal chest pain which was      very atypical.  Nevertheless, given his increasing blood pressure I      have increased his Imdur to 60 mg a day.  2. The patient will follow up with [facility] regarding his      dyslipidemia as well as diabetes.  3. At this point in time, given his further absence of symptoms I do      not think a cardiac catheterization is necessarily warranted.  Will      keep, however, a close eye on the patient and will see him back in      6 months.     Learta Codding, MD,FACC  Electronically Signed    GED/MedQ  DD: 12/01/2006  DT: 12/01/2006  Job #: [id number]   cc:   Prudy Feeler, Dr.

## 2011-03-22 NOTE — H&P (Signed)
NAME:  JAYME, CHAM NO.:  [id number]   MEDICAL RECORD NO.:  [id number]                   PATIENT TYPE:  INP   LOCATION:  A216                                 FACILITY:  [facility]   PHYSICIAN:  Barbaraann Barthel, M.D.              DATE OF BIRTH:  05/16/1941   DATE OF ADMISSION:  10/11/2003  DATE OF DISCHARGE:                                HISTORY & PHYSICAL   NOTE:  Surgery was asked to see this 70 year old white male who came to the  emergency room with abdominal pain.   CHIEF COMPLAINT:  Abdominal pain with nausea and vomiting.   HISTORY OF PRESENT MEDICAL ILLNESS:  The patient states that he has had this  pain for approximately four days.  He sought medical attention in the  emergency room three days ago and was treated with a GI cocktail without  improvement.  He later saw Dr. Jena Gauss who sent him to the emergency room and  workup proceeded today in the emergency room and he was found on CT scan to  have acute cholecystitis.  This was confirmed by sonography which did not  show the presence the stones, but clearly an inflamed gallbladder with  minimal common bile duct dilatation with no dilatation of intrahepatic  radicals or any evidence sonographically of pancreatitis.  Surgery was  consulted and responded immediately.   PHYSICAL EXAMINATION:  GENERAL:  Discloses a talkative 70 year old white  male who is uncomfortable, but appears in no acute distress.  He is 5 foot,  9 inches, weighs 153 pounds.  VITAL SIGNS:  Temperature 97.6 with a blood pressure of 131/91.  Pulse rate  124 on admission.  Respirations are 20.  HEENT:  Head:  Normocephalic.  Eyes:  Extraocular movements are intact.  Pupils are round and react to light and accommodation.  There is no  conjunctival pallor or scleral injection.  The sclerae is a normal tincture.  Nose and oral mucosa are somewhat dry.  The patient is edentulous.  NECK:  Supple and cylindrical without jugular vein  distention, thyromegaly  or tracheal deviation.  There are no bruits auscultated and there is no  cervical adenopathy.  CHEST:  Diminished breath sounds bilaterally.  There are no wheezes that I  auscultate.  HEART:  Regular rhythm.  ABDOMEN:  Minimal guarding in the right upper quadrant.  Bowel sounds are  normoactive.  RECTAL:  Smooth prostate, guaiac negative stools.  Patient has had a  previous right herniorrhaphy x2.  EXTREMITIES:  No clubbing or cyanosis or leg pain.   REVIEW OF SYSTEMS:  GI:  Right upper quadrant pain that has been  postprandial.  He states that he has had this pain as much as last year.  He  has been treated with Nexium for GERD-like symptoms.  RECTAL:  He has had no  history of bright red rectal bleeding or black tarry stools.  He did undergo  a colonoscopy with polypectomy in the past by Dr. Jena Gauss.  He has had no past  history of hepatitis.  He complains of some constipation for which he was  taking some stool softeners, again prescribed by Dr. Jena Gauss.  No weight loss.  The patient has had a history of pancreatitis in the past when he was a  drinker.  He now, no longer drinks.  GU:  The patient has no dysuria.  The  patient has had cholelithiasis in the past.  ENDOCRINE:  Patient is a type 2  diabetic.  He takes Glucovance.  He does not know what dose he takes.  No  past history of thyroid disease.  MUSCULOSKELETAL:  Grossly within normal  limits for age.  CARDIORESPIRATORY:  The patient is a heavy smoker.  Smokes  at least two or three packs a day.  He has heavy nicotine stains on his  fingers and he has been treated for chronic obstructive pulmonary disease  with inhalers in the past.  He also has had a history of a cardiac stent  placed in [place] last year and in 1999 he had a stent placed at [facility].  He has had no chest pain or unexplained chest pain or shortness  of breath recently.   SOCIOECONOMIC:  The patient is disabled.  He is cared  for in part by his son  and family and wife who are with him at present.   IMPRESSIONS:  1. Acute cholecystitis.  2. Chronic obstructive pulmonary disease.  3. Alcohol abuse.  4. Coronary artery disease.  5. History of gastroesophageal reflux disease.   LABORATORY DATA:  The patient's liver function studies are all grossly  within normal limits, with a bilirubin of 0.7, direct of 0.1, indirect 0.6.  Alkaline phosphatase 72, SGOT 15, SGPT of 9.  Albumin 3.7.  Electrolytes:  Mild hyponatremia at 132 with a glucose of 399.  White count 17.8 with an  H&H of 14.5 and 43.3, with 89% neutrophils noted.   STUDIES:  Chest x-ray shows no active disease.  Sonogram of gallbladder  shows distended gallbladder with thickening and pericholecystic fluid and no  definite evidence of stones and this was essentially seen as well on CT  scan.   PLAN:  I will admit this patient due to his multiple medical problems.  I  will have a cardiology consult and have the Hospitalist follow this patient  with me medically and we will plan for surgery during this hospitalization.  We will place him on a sliding scale to cover his blood sugar requirements  and place him on NPO and continue IV hydration and antibiotic therapy.   We have discussed surgery with the family members, with his son and I will  discuss this later with his family members.  We discussed with his son, with  the patient complications of surgery, not limited to but including bleeding,  infection, damage to bile ducts, perforation of organs and transitory  diarrhea.  We also stated that laparoscopic surgery may not be possible in  this patient because of the acute inflammation.  He may require an open  cholecystectomy with drainage and x-rays of bile ducts.  All this was  discussed in detail and informed consent was obtained and all questions were  answered.    ___________________________________________  Barbaraann Barthel, M.D.   WB/MEDQ  D:  10/11/2003  T:  10/11/2003  Job:  [id number]   cc:   Nicoletta Dress. Colon Branch, M.D.  [address] [place]  [place] [address]  Fax: [phone]   Emergency Room   Dr. Dolores Frame. Roetta Sessions, M.D.  [address]  [place]  [place] [address]  Fax: [phone]   Cardiologist in consult

## 2011-06-04 ENCOUNTER — Emergency Department (HOSPITAL_COMMUNITY): Payer: Medicare Other

## 2011-06-04 ENCOUNTER — Encounter: Payer: Self-pay | Admitting: Emergency Medicine

## 2011-06-04 ENCOUNTER — Other Ambulatory Visit: Payer: Self-pay

## 2011-06-04 ENCOUNTER — Inpatient Hospital Stay (HOSPITAL_COMMUNITY)
Admission: EM | Admit: 2011-06-04 | Discharge: 2011-06-06 | DRG: 439 | Disposition: A | Discharge status: Home / Self Care | Payer: Medicare Other | Attending: Internal Medicine | Admitting: Internal Medicine

## 2011-06-04 DIAGNOSIS — I10 Essential (primary) hypertension: Secondary | ICD-10-CM | POA: Diagnosis present

## 2011-06-04 DIAGNOSIS — I739 Peripheral vascular disease, unspecified: Secondary | ICD-10-CM | POA: Insufficient documentation

## 2011-06-04 DIAGNOSIS — F1011 Alcohol abuse, in remission: Secondary | ICD-10-CM

## 2011-06-04 DIAGNOSIS — E138 Other specified diabetes mellitus with unspecified complications: Secondary | ICD-10-CM | POA: Diagnosis present

## 2011-06-04 DIAGNOSIS — E1369 Other specified diabetes mellitus with other specified complication: Secondary | ICD-10-CM | POA: Diagnosis present

## 2011-06-04 DIAGNOSIS — I5042 Chronic combined systolic (congestive) and diastolic (congestive) heart failure: Secondary | ICD-10-CM | POA: Diagnosis present

## 2011-06-04 DIAGNOSIS — E785 Hyperlipidemia, unspecified: Secondary | ICD-10-CM | POA: Insufficient documentation

## 2011-06-04 DIAGNOSIS — I259 Chronic ischemic heart disease, unspecified: Secondary | ICD-10-CM

## 2011-06-04 DIAGNOSIS — E871 Hypo-osmolality and hyponatremia: Secondary | ICD-10-CM | POA: Diagnosis present

## 2011-06-04 DIAGNOSIS — I5022 Chronic systolic (congestive) heart failure: Secondary | ICD-10-CM | POA: Diagnosis present

## 2011-06-04 DIAGNOSIS — I509 Heart failure, unspecified: Secondary | ICD-10-CM | POA: Diagnosis present

## 2011-06-04 DIAGNOSIS — K859 Acute pancreatitis without necrosis or infection, unspecified: Principal | ICD-10-CM | POA: Diagnosis present

## 2011-06-04 HISTORY — DX: Peptic ulcer, site unspecified, unspecified as acute or chronic, without hemorrhage or perforation: K27.9

## 2011-06-04 HISTORY — DX: Peripheral vascular disease, unspecified: I73.9

## 2011-06-04 HISTORY — DX: Acute pancreatitis without necrosis or infection, unspecified: K85.90

## 2011-06-04 HISTORY — DX: Polyneuropathy, unspecified: G62.9

## 2011-06-04 HISTORY — DX: Essential (primary) hypertension: I10

## 2011-06-04 HISTORY — DX: Gastro-esophageal reflux disease without esophagitis: K21.9

## 2011-06-04 HISTORY — DX: Hyperlipidemia, unspecified: E78.5

## 2011-06-04 LAB — URINALYSIS, ROUTINE W REFLEX MICROSCOPIC
Bilirubin Urine: NEGATIVE
Ketones, ur: NEGATIVE mg/dL
Nitrite: NEGATIVE
Protein, ur: 100 mg/dL — AB
Specific Gravity, Urine: >1.03 — ABNORMAL HIGH (ref 1.005–1.030)
Urobilinogen, UA: 0.2 mg/dL (ref 0.0–1.0)

## 2011-06-04 LAB — GLUCOSE, CAPILLARY
Glucose-Capillary: 294 mg/dL — ABNORMAL HIGH (ref 70–99)
Glucose-Capillary: 139 mg/dL — ABNORMAL HIGH (ref 70–99)
Glucose-Capillary: 126 mg/dL — ABNORMAL HIGH (ref 70–99)

## 2011-06-04 LAB — COMPREHENSIVE METABOLIC PANEL
AST: 11 U/L (ref 0–37)
BUN: 16 mg/dL (ref 6–23)
CO2: 22 mEq/L (ref 19–32)
Calcium: 11.9 mg/dL — ABNORMAL HIGH (ref 8.4–10.5)
Chloride: 96 mEq/L (ref 96–112)
Creatinine, Ser: 0.95 mg/dL (ref 0.50–1.35)
GFR calc Af Amer: >60 mL/min (ref >60)
GFR calc non Af Amer: >60 mL/min (ref >60)
Glucose, Bld: 272 mg/dL — ABNORMAL HIGH (ref 70–99)
Total Bilirubin: 0.3 mg/dL (ref 0.3–1.2)

## 2011-06-04 LAB — CBC
HCT: 41.1 % (ref 39.0–52.0)
Hemoglobin: 14 g/dL (ref 13.0–17.0)
MCH: 30.8 pg (ref 26.0–34.0)
MCV: 90.3 fL (ref 78.0–100.0)
RBC: 4.55 MIL/uL (ref 4.22–5.81)
WBC: 8.1 10*3/uL (ref 4.0–10.5)

## 2011-06-04 LAB — URINE MICROSCOPIC-ADD ON

## 2011-06-04 MED ORDER — HYDROCODONE-ACETAMINOPHEN 5-325 MG PO TABS
1.0000 | ORAL_TABLET | Freq: Four times a day (QID) | ORAL | Status: DC | PRN
  Administered 2011-06-05 – 2011-06-06 (×4): 1 via ORAL
  Filled 2011-06-04 (×3): qty 1

## 2011-06-04 MED ORDER — IPRATROPIUM-ALBUTEROL 18-103 MCG/ACT IN AERO: 2.0000 | INHALATION_SPRAY | RESPIRATORY_TRACT | Status: DC | PRN

## 2011-06-04 MED ORDER — ACETAMINOPHEN 325 MG PO TABS: 650.0000 mg | ORAL_TABLET | Freq: Four times a day (QID) | ORAL | Status: DC | PRN

## 2011-06-04 MED ORDER — NICOTINE 14 MG/24HR TD PT24
14.0000 mg | MEDICATED_PATCH | Freq: Every day | TRANSDERMAL | Status: DC | PRN
  Administered 2011-06-04: 14 mg via TRANSDERMAL

## 2011-06-04 MED ORDER — FENOFIBRATE 160 MG PO TABS
160.0000 mg | ORAL_TABLET | Freq: Every day | ORAL | Status: DC
  Administered 2011-06-05 – 2011-06-06 (×2): 160 mg via ORAL
  Filled 2011-06-04 (×3): qty 1

## 2011-06-04 MED ORDER — ONDANSETRON HCL 4 MG/2ML IJ SOLN
4.0000 mg | Freq: Once | INTRAMUSCULAR | Status: AC
  Administered 2011-06-04: 4 mg via INTRAVENOUS
  Filled 2011-06-04: qty 2

## 2011-06-04 MED ORDER — LISINOPRIL 10 MG PO TABS
10.0000 mg | ORAL_TABLET | Freq: Every day | ORAL | Status: DC
  Administered 2011-06-05 – 2011-06-06 (×2): 10 mg via ORAL
  Filled 2011-06-04: qty 1

## 2011-06-04 MED ORDER — ENOXAPARIN SODIUM 40 MG/0.4ML ~~LOC~~ SOLN
40.0000 mg | SUBCUTANEOUS | Status: DC
  Administered 2011-06-04 – 2011-06-05 (×2): 40 mg via SUBCUTANEOUS
  Filled 2011-06-04: qty 0.4

## 2011-06-04 MED ORDER — SPIRONOLACTONE 25 MG PO TABS
25.0000 mg | ORAL_TABLET | Freq: Every day | ORAL | Status: DC
  Administered 2011-06-05 – 2011-06-06 (×2): 25 mg via ORAL
  Filled 2011-06-04: qty 1

## 2011-06-04 MED ORDER — SODIUM CHLORIDE 0.9 % IV SOLN
INTRAVENOUS | Status: AC
  Administered 2011-06-04: 19:00:00 via INTRAVENOUS

## 2011-06-04 MED ORDER — INSULIN ASPART 100 UNIT/ML ~~LOC~~ SOLN
0.0000 [IU] | SUBCUTANEOUS | Status: DC
  Administered 2011-06-04 (×2): 2 [IU] via SUBCUTANEOUS
  Administered 2011-06-05: 11 [IU] via SUBCUTANEOUS
  Administered 2011-06-06: 2 [IU] via SUBCUTANEOUS
  Administered 2011-06-06: 5 [IU] via SUBCUTANEOUS

## 2011-06-04 MED ORDER — METOPROLOL TARTRATE 50 MG PO TABS
50.0000 mg | ORAL_TABLET | Freq: Every day | ORAL | Status: DC
  Administered 2011-06-05 – 2011-06-06 (×2): 50 mg via ORAL
  Filled 2011-06-04: qty 1

## 2011-06-04 MED ORDER — IPRATROPIUM-ALBUTEROL 18-103 MCG/ACT IN AERO
2.0000 | INHALATION_SPRAY | Freq: Three times a day (TID) | RESPIRATORY_TRACT | Status: DC | PRN
  Administered 2011-06-04: 2 via RESPIRATORY_TRACT

## 2011-06-04 MED ORDER — ACETAMINOPHEN 650 MG RE SUPP: 650.0000 mg | Freq: Four times a day (QID) | RECTAL | Status: DC | PRN

## 2011-06-04 MED ORDER — MORPHINE SULFATE 4 MG/ML IJ SOLN
4.0000 mg | Freq: Once | INTRAMUSCULAR | Status: AC
  Administered 2011-06-04: 4 mg via INTRAVENOUS
  Filled 2011-06-04: qty 1

## 2011-06-04 MED ORDER — IOHEXOL 300 MG/ML  SOLN
100.0000 mL | Freq: Once | INTRAMUSCULAR | Status: AC | PRN
  Administered 2011-06-04: 100 mL via INTRAVENOUS

## 2011-06-04 MED ORDER — HYDROMORPHONE HCL 1 MG/ML IJ SOLN
1.0000 mg | Freq: Once | INTRAMUSCULAR | Status: AC
  Administered 2011-06-04: 1 mg via INTRAVENOUS

## 2011-06-04 MED ORDER — INSULIN GLARGINE 100 UNIT/ML ~~LOC~~ SOLN
10.0000 [IU] | Freq: Every day | SUBCUTANEOUS | Status: DC
  Administered 2011-06-04 – 2011-06-05 (×2): 10 [IU] via SUBCUTANEOUS

## 2011-06-04 MED ORDER — ONDANSETRON HCL 4 MG/2ML IJ SOLN: 4.0000 mg | Freq: Four times a day (QID) | INTRAMUSCULAR | Status: DC | PRN

## 2011-06-04 MED ORDER — PNEUMOCOCCAL VAC POLYVALENT 25 MCG/0.5ML IJ INJ
0.5000 mL | INJECTION | Freq: Once | INTRAMUSCULAR | Status: AC
  Administered 2011-06-05: 0.5 mL via INTRAMUSCULAR
  Filled 2011-06-04: qty 0.5

## 2011-06-04 MED ORDER — IPRATROPIUM-ALBUTEROL 18-103 MCG/ACT IN AERO
2.0000 | INHALATION_SPRAY | Freq: Four times a day (QID) | RESPIRATORY_TRACT | Status: DC
  Administered 2011-06-05 – 2011-06-06 (×6): 2 via RESPIRATORY_TRACT

## 2011-06-04 MED ORDER — HYDROMORPHONE HCL 1 MG/ML IJ SOLN
0.2500 mg | INTRAMUSCULAR | Status: DC | PRN
  Administered 2011-06-04 – 2011-06-05 (×2): 0.25 mg via INTRAVENOUS

## 2011-06-04 NOTE — ED Provider Notes (Signed)
History    Scribed for Stephen Nielsen, MD the patient was seen in room APA12/APA12. This chart was scribed by [name]. This patient's care was started at 11:47AM.  Chief Complaint  Patient presents with  . Abdominal Pain   HPI Patient is a 70 year old male c/o abdominal pain onset 2 days ago and persistent since with associated abdominal swelling, urinary frequency and dribbling . Denies chest pain, nausea, vomiting, fever, dysuria. States abdominal pain was significantly relieved with pain medications but is unable to specify which medication was used. Notes he felt constipated yesterday but used Epsom salts to aid him in having a BM with success (2 BMs yesterday). Patient further reports he was told previously that he has problems with his gallbladder and was referred to Dr. Jena Gauss for similar abdominal pain in March 2012 but never followed up as the pain improved on its own. Patient with h/o diabetes, hypertension, heart disease, pancreatitis, neuropathy and CABG with 4 stents placed.  Past Medical History  Diagnosis Date  . Diabetes mellitus   . Hypertension   . Heart disease   . Pancreatitis   . Neuropathy     Past Surgical History  Procedure Date  . Coronary artery bypass graft   . Hernia repair   . Cholecystectomy     History reviewed. No pertinent family history.  History  Substance Use Topics  . Smoking status: Current Everyday Smoker -- 2.0 packs/day  . Smokeless tobacco: Not on file  . Alcohol Use: No      Review of Systems  Constitutional: Negative for fever and chills.  HENT: Negative for neck pain and neck stiffness.   Eyes: Negative for pain.  Respiratory: Negative for shortness of breath.   Cardiovascular: Negative for chest pain and leg swelling.  Gastrointestinal: Positive for abdominal pain, constipation and abdominal distention. Negative for nausea and vomiting.  Genitourinary: Positive for frequency. Negative for dysuria.       Urinary dribbling    Musculoskeletal: Negative for back pain.  Skin: Negative for rash.  Neurological: Negative for headaches.  All other systems reviewed and are negative.    Physical Exam  BP 118/80  Pulse 77  Temp(Src) 97.7 F (36.5 C) (Oral)  Resp 22  Ht 5\' 10"  (1.778 m)  Wt 197 lb (89.359 kg)  BMI 28.27 kg/m2  SpO2 100%  Physical Exam  Nursing note and vitals reviewed. Constitutional: He is oriented to person, place, and time. He appears well-developed and well-nourished. No distress.       Hypertensive.   HENT:  Head: Normocephalic and atraumatic.  Eyes: Conjunctivae are normal. Pupils are equal, round, and reactive to light.  Neck: Normal range of motion. Neck supple.  Cardiovascular: Normal rate, regular rhythm and normal heart sounds.   No murmur heard. Pulmonary/Chest: Effort normal and breath sounds normal.  Abdominal: Soft. Bowel sounds are normal. There is generalized tenderness.       No RUQ tenderness. No pulsatile masses noted.  Musculoskeletal: Normal range of motion. He exhibits no edema.  Neurological: He is alert and oriented to person, place, and time. No sensory deficit.  Skin: Skin is warm and dry.  Psychiatric: He has a normal mood and affect. His behavior is normal.    ED Course  Procedures  OTHER DATA REVIEWED: Nursing notes, vital signs, and past medical records reviewed. Lab and imaging results reviewed and evaluated EKG reviewed and evaluated   DIAGNOSTIC STUDIES: Date: 06/04/2011  Rate: 75  Rhythm: normal sinus  rhythm  QRS Axis: left  Intervals: normal  ST/T Wave abnormalities: nonspecific ST changes  Conduction Disutrbances:none  Narrative Interpretation:   Old EKG Reviewed: changes noted Old INF and LAT T waves inversion normalized versus 04-24-10  LABS / RADIOLOGY: Results for orders placed during the hospital encounter of 06/04/11  GLUCOSE, CAPILLARY      Component Value Range   Glucose-Capillary 294 (*) 70 - 99 (mg/dL)  URINALYSIS, ROUTINE  W REFLEX MICROSCOPIC      Component Value Range   Color, Urine YELLOW  YELLOW    Appearance CLEAR  CLEAR    Specific Gravity, Urine >1.030 (*) 1.005 - 1.030    pH 5.5  5.0 - 8.0    Glucose, UA >1000 (*) NEGATIVE (mg/dL)   Hgb urine dipstick TRACE (*) NEGATIVE    Bilirubin Urine NEGATIVE  NEGATIVE    Ketones, ur NEGATIVE  NEGATIVE (mg/dL)   Protein, ur 454 (*) NEGATIVE (mg/dL)   Urobilinogen, UA 0.2  0.0 - 1.0 (mg/dL)   Nitrite NEGATIVE  NEGATIVE    Leukocytes, UA NEGATIVE  NEGATIVE   URINE MICROSCOPIC-ADD ON      Component Value Range   Squamous Epithelial / LPF RARE  RARE    WBC, UA 0-2  <3 (WBC/hpf)   RBC / HPF 0-2  <3 (RBC/hpf)   Casts HYALINE CASTS (*) NEGATIVE   CBC      Component Value Range   WBC 8.1  4.0 - 10.5 (K/uL)   RBC 4.55  4.22 - 5.81 (MIL/uL)   Hemoglobin 14.0  13.0 - 17.0 (g/dL)   HCT 09.8  11.9 - 14.7 (%)   MCV 90.3  78.0 - 100.0 (fL)   MCH 30.8  26.0 - 34.0 (pg)   MCHC 34.1  30.0 - 36.0 (g/dL)   RDW 82.9  56.2 - 13.0 (%)   Platelets 196  150 - 400 (K/uL)  COMPREHENSIVE METABOLIC PANEL      Component Value Range   Sodium 132 (*) 135 - 145 (mEq/L)   Potassium 4.5  3.5 - 5.1 (mEq/L)   Chloride 96  96 - 112 (mEq/L)   CO2 22  19 - 32 (mEq/L)   Glucose, Bld 272 (*) 70 - 99 (mg/dL)   BUN 16  6 - 23 (mg/dL)   Creatinine, Ser 8.65  0.50 - 1.35 (mg/dL)   Calcium 78.4 (*) 8.4 - 10.5 (mg/dL)   Total Protein 7.5  6.0 - 8.3 (g/dL)   Albumin 3.5  3.5 - 5.2 (g/dL)   AST 11  0 - 37 (U/L)   ALT 6  0 - 53 (U/L)   Alkaline Phosphatase 42  39 - 117 (U/L)   Total Bilirubin 0.3  0.3 - 1.2 (mg/dL)   GFR calc non Af Amer >60  >60 (mL/min)   GFR calc Af Amer >60  >60 (mL/min)   Ct Abdomen Pelvis W Contrast  06/04/2011  *RADIOLOGY REPORT*  Clinical Data: Abdominal pain and swelling for 2 days.  CT ABDOMEN AND PELVIS WITH CONTRAST  Technique:  Multidetector CT imaging of the abdomen and pelvis was performed following the standard protocol during bolus administration of  intravenous contrast.  Contrast: 100 ml Omnipaque 300  Comparison: 10/11/2003  Findings: Postoperative changes in the mediastinum.  Lung bases are clear.  Surgical absence of the gallbladder.  No bile duct dilatation.  Diffuse low attenuation throughout the liver consistent with fatty infiltration.  Small hepatic cysts.  Normal spleen size.  Calcifications throughout the pancreas consistent  with chronic pancreatitis.  There is a small amount of infiltration around the tail of the pancreas which could represent early changes of acute pancreatitis.  Correlation with laboratory values is recommended.  The adrenal glands, kidneys, proximal ureters, stomach, small bowel, and retroperitoneal lymph nodes are unremarkable.  There is fairly dense calcification of the abdominal aorta without aneurysm.  No free fluid or free air in the abdomen.  Pelvis:  The bladder wall is not thickened.  The prostate gland is mildly enlarged.  No free or loculated pelvic fluid collections. The appendix is normal.  Scattered diverticula in the sigmoid colon without inflammatory change.  Mild degenerative changes in the lumbar spine.  IMPRESSION: Chronic calcific pancreatitis.  There is minimal infiltration around the tail the pancreas which might represent early changes of acute pancreatitis.  Correlation with laboratory values recommended.  Diffuse fatty infiltration of the liver.  Surgically absent gallbladder.  Original Report Authenticated By: Marlon Pel, M.D.   PROCEDURES:  ED COURSE / COORDINATION OF CARE: 2:45PM- Patient resting comfortably.  2:55Pm- Physician reviewed lab and imaging results. Noted Ct-abdomen with calcified pancreatitis and additional order for Lipase place.  For acute pancreatitis and age with multiple comorobidities, MED c/s for admit    MDM: Differential Diagnosis: pamcreatitis, gerd, acs, cholecystitis/ cholelithiasis, gastritis    PLAN: admit      MEDICATIONS GIVEN IN THE  E.D.  Medications  iohexol (OMNIPAQUE) 300 MG/ML injection 100 mL (not administered)  morphine injection 4 mg (4 mg Intravenous Given 06/04/11 1256)  ondansetron (ZOFRAN) injection 4 mg (4 mg Intravenous Given 06/04/11 1256)     DISCHARGE MEDICATIONS: New Prescriptions   No medications on file      I personally performed the services described in this documentation, which was scribed in my presence. The recorded information has been reviewed and considered. Stephen Nielsen, MD    Stephen Nielsen, MD 06/04/11 [phone]

## 2011-06-04 NOTE — ED Notes (Signed)
Pt c/o abd pain for 2 days. Pt states he needs to be catherized. Pt states he only dribbles when he urinates.

## 2011-06-04 NOTE — ED Notes (Signed)
Pt resting quietly. NAD.

## 2011-06-04 NOTE — H&P (Signed)
Patient's PCP: Edilia Bo, PA  Chief Complaint:  Abdominal pain  History of Present Illness: Stephen [name] is 70 year old Caucasian male with history of tobacco use, hypertension, type 2 diabetes, hyperlipidemia, coronary disease status post CABG, peripheral vascular disease, history of pancreatitis he presents with abdominal pain. Patient reports that his symptoms started about 2 days ago where he had been having sharp pain in the right lower quadrant. He also has been having some dull achy pain in the epigastric region. He initially thought that these symptoms were similar to the pain that he has had with pancreatitis in the past as a result presented to the ER for further evaluation. In the ER the patient has had imaging that suggests pancreatitis and laboratory work indicates he has elevated lipase. Denies any fevers, chills, or vomiting. Has some nausea. Reports that he has chronic shortness of breath and some chest pain post CABG but is unchanged. Denies any abdominal pain. Denies any diarrhea. Denies any headaches or vision changes.  Meds: Scheduled Meds:    . enoxaparin  40 mg Subcutaneous Q24H  . fenofibrate  160 mg Oral Daily  . HYDROmorphone  1 mg Intravenous Once  . insulin aspart  0-15 Units Subcutaneous Q4H  . insulin glargine  10 Units Subcutaneous QHS  . lisinopril  10 mg Oral Daily  . metoprolol tartrate  50 mg Oral Daily  . morphine  4 mg Intravenous Once  . ondansetron  4 mg Intravenous Once  . pneumococcal 23 valent vaccine  0.5 mL Intramuscular Once  . spironolactone  25 mg Oral Daily   Continuous Infusions:  PRN Meds:.iohexol Allergies: Review of patient's allergies indicates no known allergies. Past Medical History  Diagnosis Date  . Diabetes mellitus   . Hypertension   . Heart disease   . Pancreatitis   . Neuropathy   . Hyperlipemia   . Peripheral vascular disease    Past Surgical History  Procedure Date  . Coronary artery bypass graft   . Hernia repair     . Cholecystectomy    Family History  Problem Relation Age of Onset  . Coronary artery disease Mother   . Coronary artery disease Father   . Diabetes Sister   . Suicidality Brother   . Cirrhosis Brother    History   Social History  . Marital Status: Married    Spouse Name: N/A    Number of Children: N/A  . Years of Education: N/A   Occupational History  . Not on file.   Social History Main Topics  . Smoking status: Current Everyday Smoker -- 2.0 packs/day  . Smokeless tobacco: Not on file  . Alcohol Use: No     Quit 1985  . Drug Use: No  . Sexually Active:    Other Topics Concern  . Not on file   Social History Narrative  . No narrative on file   Review of Systems: All systems were reviewed with the patient and was positive as per history of present illness otherwise all other systems were negative Physical Exam: Blood pressure 139/78, pulse 80, temperature 97.7 F (36.5 C), temperature source Oral, resp. rate 16, height 5\' 10"  (1.778 m), weight 89.359 kg (197 lb), SpO2 97.00%. General: Awake, Oriented x3, No acute distress. HEENT: EOMI, Moist mucous membranes Neck: Supple CV: S1 and S2 Lungs: Clear to ascultation bilaterally Abdomen: Soft, Nondistended, +bowel sounds. Has generalized abdominal pain worse with palpation in the lower right quadrant. No guarding no rebound tenderness. Negative Murphy's sign.  Ext: Good pulses. Trace edema. No clubbing or cyanosis noted. Neuro: Cranial Nerves II-XII grossly intact. Has 5/5 motor strength in upper and lower extremities. Lab results:  Basename 06/04/11 1230  NA 132*  K 4.5  CL 96  CO2 22  GLUCOSE 272*  BUN 16  CREATININE 0.95  CALCIUM 11.9*  MG --  PHOS --    Basename 06/04/11 1230  AST 11  ALT 6  ALKPHOS 42  BILITOT 0.3  PROT 7.5  ALBUMIN 3.5    Basename 06/04/11 1230  LIPASE 1313*  AMYLASE --    Basename 06/04/11 1230  WBC 8.1  NEUTROABS --  HGB 14.0  HCT 41.1  MCV 90.3  PLT 196   No  results found for this basename: CKTOTAL:3,CKMB:3,CKMBINDEX:3,TROPONINI:3 in the last 72 hours No results found for this basename: POCBNP:3 in the last 72 hours No results found for this basename: DDIMER in the last 72 hours No results found for this basename: HGBA1C:2 in the last 72 hours No results found for this basename: CHOL:2,HDL:2,LDLCALC:2,TRIG:2,CHOLHDL:2,LDLDIRECT:2 in the last 72 hours No results found for this basename: TSH,T4TOTAL,FREET3,T3FREE,THYROIDAB in the last 72 hours No results found for this basename: VITAMINB12:2,FOLATE:2,FERRITIN:2,TIBC:2,IRON:2,RETICCTPCT:2 in the last 72 hours Imaging results:  Ct Abdomen Pelvis W Contrast  06/04/2011  *RADIOLOGY REPORT*  Clinical Data: Abdominal pain and swelling for 2 days.  CT ABDOMEN AND PELVIS WITH CONTRAST  Technique:  Multidetector CT imaging of the abdomen and pelvis was performed following the standard protocol during bolus administration of intravenous contrast.  Contrast: 100 ml Omnipaque 300  Comparison: 10/11/2003  Findings: Postoperative changes in the mediastinum.  Lung bases are clear.  Surgical absence of the gallbladder.  No bile duct dilatation.  Diffuse low attenuation throughout the liver consistent with fatty infiltration.  Small hepatic cysts.  Normal spleen size.  Calcifications throughout the pancreas consistent with chronic pancreatitis.  There is a small amount of infiltration around the tail of the pancreas which could represent early changes of acute pancreatitis.  Correlation with laboratory values is recommended.  The adrenal glands, kidneys, proximal ureters, stomach, small bowel, and retroperitoneal lymph nodes are unremarkable.  There is fairly dense calcification of the abdominal aorta without aneurysm.  No free fluid or free air in the abdomen.  Pelvis:  The bladder wall is not thickened.  The prostate gland is mildly enlarged.  No free or loculated pelvic fluid collections. The appendix is normal.  Scattered  diverticula in the sigmoid colon without inflammatory change.  Mild degenerative changes in the lumbar spine.  IMPRESSION: Chronic calcific pancreatitis.  There is minimal infiltration around the tail the pancreas which might represent early changes of acute pancreatitis.  Correlation with laboratory values recommended.  Diffuse fatty infiltration of the liver.  Surgically absent gallbladder.  Original Report Authenticated By: Marlon Pel, M.D.   Other results:  Assessment & Plan by Problem: 1. Acute pancreatitis. Patient has chronic calcifications noted on imaging on the pancreas indicative of chronic pancreatitis. Patient reports that he has been on pancreatic enzymes about 12 years ago. Will consult GI in the morning to determine whether the patient needs to be placed back on pancreatic enzymes. Will gently hydrate the patient given cardiac history. We'll tend lipase. We'll continue n.p.o. and supportive care. We'll send for lipid panel in the morning to determine if the patient has hypertriglyceridemia causing pancreatitis. Has had cholecystectomy in 2005.  2. Hypercalcemia. Likely due to dehydration from pancreatitis. After gentle hydration is still hypercalcemic would consider further  workup at that time  3. Hyponatremia. Monitor for now.  4. History of coronary artery disease status post CABG continue home medications.  5. History of COPD. Stable  6. History of tobacco use. Discussed with the patient about smoking cessation. On when necessary nicotine patch.  7. Type 2 diabetes. Continue Lantus and sliding scale insulin.  8. Hypertension continue home medications.  9. Hyperlipidemia. Continue home medications.  10. History of peripheral vascular disease. Stable.  11. History of chronic systolic heart failure with an ejection fraction of 40% based on 2-D echocardiogram performed on 04/19/2010. Patient compensated at this time continue home medications. Will minimize  fluids.  12. Prophylaxis. Lovenox for DVT prophylaxis.  13. CODE STATUS full code.  Time spent on admission, talking to the patient, and coordinating care was: 60 mins.  Cassidee Deats A 06/04/2011, 5:05 PM

## 2011-06-04 NOTE — ED Notes (Signed)
Pt requested O2. O2 applied at 2L [place]. Pt states he has to take his combivent often at times and thinks he would feel better if he had O2.

## 2011-06-05 ENCOUNTER — Encounter (HOSPITAL_COMMUNITY): Payer: Self-pay | Admitting: Gastroenterology

## 2011-06-05 DIAGNOSIS — I5042 Chronic combined systolic (congestive) and diastolic (congestive) heart failure: Secondary | ICD-10-CM | POA: Diagnosis present

## 2011-06-05 DIAGNOSIS — E138 Other specified diabetes mellitus with unspecified complications: Secondary | ICD-10-CM | POA: Diagnosis present

## 2011-06-05 DIAGNOSIS — K861 Other chronic pancreatitis: Secondary | ICD-10-CM

## 2011-06-05 DIAGNOSIS — F1011 Alcohol abuse, in remission: Secondary | ICD-10-CM

## 2011-06-05 DIAGNOSIS — I10 Essential (primary) hypertension: Secondary | ICD-10-CM | POA: Diagnosis present

## 2011-06-05 LAB — LIPID PANEL
Cholesterol: 211 mg/dL — ABNORMAL HIGH (ref 0–200)
HDL: 40 mg/dL (ref >39)
LDL Cholesterol: UNDETERMINED mg/dL (ref 0–99)
Total CHOL/HDL Ratio: 5.3 RATIO
Triglycerides: 443 mg/dL — ABNORMAL HIGH (ref <150)
VLDL: UNDETERMINED mg/dL (ref 0–40)

## 2011-06-05 LAB — BASIC METABOLIC PANEL
CO2: 25 mEq/L (ref 19–32)
Chloride: 97 mEq/L (ref 96–112)
Glucose, Bld: 94 mg/dL (ref 70–99)
Potassium: 3.9 mEq/L (ref 3.5–5.1)
Sodium: 134 mEq/L — ABNORMAL LOW (ref 135–145)

## 2011-06-05 LAB — GLUCOSE, CAPILLARY
Glucose-Capillary: 112 mg/dL — ABNORMAL HIGH (ref 70–99)
Glucose-Capillary: 126 mg/dL — ABNORMAL HIGH (ref 70–99)
Glucose-Capillary: 310 mg/dL — ABNORMAL HIGH (ref 70–99)

## 2011-06-05 LAB — CBC
HCT: 40.7 % (ref 39.0–52.0)
Hemoglobin: 13.6 g/dL (ref 13.0–17.0)
MCV: 91.3 fL (ref 78.0–100.0)
RBC: 4.46 MIL/uL (ref 4.22–5.81)
RDW: 13.3 % (ref 11.5–15.5)
WBC: 5.8 10*3/uL (ref 4.0–10.5)

## 2011-06-05 LAB — LIPASE, BLOOD: Lipase: 396 U/L — ABNORMAL HIGH (ref 11–59)

## 2011-06-05 MED ORDER — SODIUM CHLORIDE 0.9 % IJ SOLN
INTRAMUSCULAR | Status: AC
  Administered 2011-06-05: 10 mL
  Filled 2011-06-05: qty 10

## 2011-06-05 MED ORDER — DEXTROSE-NACL 5-0.9 % IV SOLN
INTRAVENOUS | Status: DC
  Administered 2011-06-05: 16:00:00 via INTRAVENOUS

## 2011-06-05 MED ORDER — DEXTROSE 50 % IV SOLN: 25.0000 mL | Freq: Once | INTRAVENOUS | Status: AC | PRN

## 2011-06-05 MED ORDER — PANTOPRAZOLE SODIUM 40 MG IV SOLR
40.0000 mg | INTRAVENOUS | Status: DC
  Administered 2011-06-05 – 2011-06-06 (×2): 40 mg via INTRAVENOUS
  Filled 2011-06-05: qty 40

## 2011-06-05 NOTE — Progress Notes (Signed)
Subjective: Patient feeling somewhat better. He continues to come [place] of abdominal pain especially in the midepigastric area and associated with some nausea and vomiting.  Objective: Weight change:   Intake/Output Summary (Last 24 hours) at 06/05/11 1432 Last data filed at 06/05/11 1235  Gross per 24 hour  Intake    600 ml  Output      0 ml  Net    600 ml   BP 147/88  Pulse 88  Temp(Src) 97.5 F (36.4 C) (Oral)  Resp 14  Ht 5\' 11"  (1.803 m)  Wt 85.73 kg (189 lb)  BMI 26.36 kg/m2  SpO2 96% General appearance: alert, cooperative, appears stated age and no distress Head: Normocephalic, without obvious abnormality, atraumatic Lungs: Decreased breath sounds throughout Chest wall: no tenderness Heart: Regular rate and rhythm with a soft 2/6 systolic ejection murmur. Abdomen: Soft, obese, distended, hypoactive bowel sounds, moderate generalized tenderness most notable in the midepigastric area. Extremities: extremities normal, atraumatic, no cyanosis or edema Pulses: 2+ and symmetric Skin: Skin color, texture, turgor normal. No rashes or lesions  Lab Results:  Basename 06/05/11 0550 06/04/11 1230  NA 134* 132*  K 3.9 4.5  CL 97 96  CO2 25 22  GLUCOSE 94 272*  BUN 15 16  CREATININE 1.04 0.95  CALCIUM 10.6* 11.9*  MG -- --  PHOS -- --    Basename 06/04/11 1230  AST 11  ALT 6  ALKPHOS 42  BILITOT 0.3  PROT 7.5  ALBUMIN 3.5    Basename 06/05/11 0550 06/04/11 1230  LIPASE 396* 1313*  AMYLASE -- --    Basename 06/05/11 0550 06/04/11 1230  WBC 5.8 8.1  NEUTROABS -- --  HGB 13.6 14.0  HCT 40.7 41.1  MCV 91.3 90.3  PLT 189 196    Studies/Results: Ct Abdomen Pelvis W Contrast   IMPRESSION: Chronic calcific pancreatitis.  There is minimal infiltration around the tail the pancreas which might represent early changes of acute pancreatitis.  Correlation with laboratory values recommended.  Diffuse fatty infiltration of the liver.  Surgically absent gallbladder.   Original Report Authenticated By: Marlon Pel, M.D.   Medications: Scheduled Meds:   . albuterol-ipratropium  2 puff Inhalation QID  . enoxaparin  40 mg Subcutaneous Q24H  . fenofibrate  160 mg Oral Daily  . HYDROmorphone  1 mg Intravenous Once  . insulin aspart  0-15 Units Subcutaneous Q4H  . insulin glargine  10 Units Subcutaneous QHS  . lisinopril  10 mg Oral Daily  . metoprolol tartrate  50 mg Oral Daily  . pantoprazole (PROTONIX) IV  40 mg Intravenous Q24H  . pneumococcal 23 valent vaccine  0.5 mL Intramuscular Once  . spironolactone  25 mg Oral Daily   Continuous Infusions:   . sodium chloride 50 mL/hr at 06/04/11 2300   PRN Meds:.acetaminophen, acetaminophen, albuterol-ipratropium, HYDROcodone-acetaminophen, HYDROmorphone, nicotine, ondansetron (ZOFRAN) IV, DISCONTD: albuterol-ipratropium  Assessment/Plan: Patient Active Hospital Problem List: Pancreatitis   Plan: Likely this is acute on chronic pancreatitis. I had an extensive discussion with the patient and he tells me that a long time ago he used to drink heavily, specifically about a fifth of liquor a day every day for a period of 5-7 years. He also describes previous episodes of pancreatitis, one being serious enough to hospitalize him. I do suspect at this point he is burnt out his pancreas and this is a chronic pancreatic flare. We'll continue n.p.o. and IV fluids. Lipase slowly improving. Start diet once lipase level normalized.  Hypercalcemia    Plan: Slowly improving as pancreatitis resolves and patient is hydrated.   Hyponatremia    Plan: Improving   History of alcohol abuse    Plan: Patient sober now for many years   HTN (hypertension)    Plan: Monitor blood pressure with pain control.   Chronic systolic congestive heart failure    Plan: Gentle hydration, watch for volume overload   Secondary DM with complication   Assessment: Likely his diabetes is brought on by pancreatic insufficiency from  burnout.    Plan: Monitor CBGs.   LOS: 1 day   Darion Juhasz K 06/05/2011, 2:32 PM

## 2011-06-05 NOTE — Progress Notes (Signed)
UR Chart Review Completed  

## 2011-06-05 NOTE — Consult Note (Signed)
Referring Provider: Dr. Betti Cruz Primary Care Physician:  Edilia Bo, PA Primary Gastroenterologist:  Dr. Darrick Penna   Date of Admission: June 04, 2011  Date of Consultation: July 05, 2011  Reason for Consultation:  Pancreatitis  HPI:  Stephen [name] is a pleasant 70 year old Caucasian male who presented yesterday with mid-abdominal pain, bloating, and swelling. Onset of symptoms began Sunday, described as a "solid pain", 7/10. No nausea or vomiting. No melena or brbpr. No diarrhea, loose stools. He has a history significant for pancreatitis, and he was actually last seen by our office in December 2004. According to office records, pancreatitis was thought to be related to ETOH. He denies any current use of ETOH, stating he quit in the 1908s-1990s. However, at the time of that visit in 2004, he had admitted to drinking.  He was supposed to undergo an EUS; Stephen [name] denies proceeding with this. It was somewhat difficult to obtain a complete history from him, as he is unclear with dates and procedures.  He denies any exacerbations of pancreatitis for "years". He quit taking pancreatic enzymes many years ago, reportedly after seen by [place] in 2004. Stopped because he "felt better".   He does report intermittent reflux, with vague dysphagia for the past few years. Reportedly chokes on water occasionally. Denies nausea. No lack of appetite or wt loss. Took Nexium in the remote past, but he denies taking a PPI as outpatient prior to admission.   As noted above, no melena or brbpr. Has intermittent constipation, takes ex-lax. Has a BM every 3 days. Last colonoscopy in July 2003 by Dr. Jena Gauss: anal papilla and internal hemorrhoids, diverticulosis, hyperplastic polyp. Per this op note, pt reportedly had an EGD that same year by Dr. Leone Payor at [facility] with findings of erosive reflux esophagitis and PUD. I do not have that current report.   Admitting lipase 1313. At time of consult, reduced to 396. LFTs nl,  lipid panel pending. No anemia on CBC. CT showed chronic calcific pancreatitis with possible early changes of acute pancreatitis. Diffuse fatty liver.    Past Medical History  Diagnosis Date  . Diabetes mellitus   . Hypertension   . Heart disease   . Pancreatitis   . Neuropathy   . Hyperlipemia   . Peripheral vascular disease   . GERD (gastroesophageal reflux disease)   . NSTEMI (non-ST elevated myocardial infarction)   . S/P colonoscopy July 2003    hyperplastic polyp, diverticulosis, anal papilla and internal hemorrhoids r  . S/P endoscopy 2003    Dr. Tish Men: erosive reflux esophagitis , PUD  . PUD (peptic ulcer disease)     diagnosed via EGD by Dr. Tish Men at [facility]    Past Surgical History  Procedure Date  . 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 Right inguinal hernia repair   . Cholecystectomy     3-4 years ago  . Appendectomy     age 26  . Cataracts     Prior to Admission medications   Medication Sig Start Date End Date Taking? Authorizing Provider  albuterol-ipratropium (COMBIVENT) 18-103 MCG/ACT inhaler Inhale 2 puffs into the lungs every 8 (eight) hours as needed.     Yes Historical Provider, MD  fenofibrate 160 MG tablet Take 160 mg by mouth daily.     Yes Historical Provider, MD  glipiZIDE (GLUCOTROL XL) 2.5 MG 24 hr tablet Take 2.5 mg by mouth daily.     Yes Historical Provider, MD  HYDROcodone-acetaminophen (VICODIN) 5-500  MG per tablet Take 1 tablet by mouth 3 (three) times daily as needed. FOR LEG PAIN    Yes Historical Provider, MD  Insulin Glargine (LANTUS SOLOSTAR [place]) Inject 20 Units into the skin at bedtime.     Yes Historical Provider, MD  lisinopril (PRINIVIL,ZESTRIL) 20 MG tablet Take 20 mg by mouth daily.     Yes Historical Provider, MD  metFORMIN (GLUCOPHAGE) 1000 MG tablet Take 1,000 mg by mouth daily with breakfast.     Yes Historical Provider, MD  metoprolol tartrate (LOPRESSOR) 25 MG tablet Take 50 mg by mouth daily.     Yes  Historical Provider, MD  spironolactone (ALDACTONE) 25 MG tablet Take 25 mg by mouth daily.     Yes Historical Provider, MD    Current Facility-Administered Medications  Medication Dose Route Frequency Provider Last Rate Last Dose  . 0.9 %  sodium chloride infusion   Intravenous Continuous Srikar A Reddy 50 mL/hr at 06/04/11 2300    . acetaminophen (TYLENOL) tablet 650 mg  650 mg Oral Q6H PRN Srikar A Reddy       Or  . acetaminophen (TYLENOL) suppository 650 mg  650 mg Rectal Q6H PRN Srikar A Reddy      . albuterol-ipratropium (COMBIVENT) inhaler 2 puff  2 puff Inhalation QID Arlyce Harman, MD   2 puff at 06/05/11 1155  . albuterol-ipratropium (COMBIVENT) inhaler 2 puff  2 puff Inhalation Q2H PRN Arlyce Harman, MD      . enoxaparin (LOVENOX) injection 40 mg  40 mg Subcutaneous Q24H Srikar A Reddy   40 mg at 06/04/11 1830  . fenofibrate tablet 160 mg  160 mg Oral Daily Srikar A Reddy   160 mg at 06/05/11 1138  . HYDROcodone-acetaminophen (NORCO) 5-325 MG per tablet 1 tablet  1 tablet Oral Q6H PRN Srikar A Reddy   1 tablet at 06/05/11 1138  . HYDROmorphone (DILAUDID) injection 0.25-0.5 mg  0.25-0.5 mg Intravenous Q4H PRN Srikar A Reddy   0.25 mg at 06/05/11 0604  . HYDROmorphone (DILAUDID) injection 1 mg  1 mg Intravenous Once Sunnie Nielsen, MD   1 mg at 06/04/11 1644  . insulin aspart (novoLOG) injection 0-15 Units  0-15 Units Subcutaneous Q4H Srikar A Reddy   2 Units at 06/04/11 2014  . insulin glargine (LANTUS) injection 10 Units  10 Units Subcutaneous QHS Srikar A Reddy   10 Units at 06/04/11 2149  . iohexol (OMNIPAQUE) 300 MG/ML injection 100 mL  100 mL Intravenous Once PRN Medication Radiologist   100 mL at 06/04/11 1359  . lisinopril (PRINIVIL,ZESTRIL) tablet 10 mg  10 mg Oral Daily Srikar A Reddy   10 mg at 06/05/11 1139  . metoprolol (LOPRESSOR) tablet 50 mg  50 mg Oral Daily Srikar A Reddy   50 mg at 06/05/11 1139  . morphine injection 4 mg  4 mg Intravenous Once Sunnie Nielsen, MD   4 mg  at 06/04/11 1256  . nicotine (NICODERM CQ - dosed in mg/24 hours) patch 14 mg  14 mg Transdermal Daily PRN Srikar A Reddy   14 mg at 06/04/11 1830  . ondansetron (ZOFRAN) injection 4 mg  4 mg Intravenous Once Sunnie Nielsen, MD   4 mg at 06/04/11 1256  . ondansetron (ZOFRAN) injection 4 mg  4 mg Intravenous Q6H PRN Srikar A Reddy      . pantoprazole (PROTONIX) injection 40 mg  40 mg Intravenous Q24H Gerrit Halls, NP   40 mg at 06/05/11 1153  .  pneumococcal 23 valent vaccine (PNU-IMMUNE) injection 0.5 mL  0.5 mL Intramuscular Once Nimish C Gosrani   0.5 mL at 06/05/11 1139  . spironolactone (ALDACTONE) tablet 25 mg  25 mg Oral Daily Srikar A Reddy   25 mg at 06/05/11 1138  . DISCONTD: albuterol-ipratropium (COMBIVENT) inhaler 2 puff  2 puff Inhalation Q8H PRN Srikar A Reddy   2 puff at 06/04/11 2157    Allergies as of 06/04/2011  . (No Known Allergies)    Family History  Problem Relation Age of Onset  . Coronary artery disease Mother   . Coronary artery disease Father   . Diabetes Sister   . Suicidality Brother   . Cirrhosis Brother   . Colon cancer Neg Hx     History   Social History  . Marital Status: Married    Spouse Name: N/A    Number of Children: N/A  . Years of Education: N/A   Occupational History  . Not on file.   Social History Main Topics  . Smoking status: Current Everyday Smoker -- 2.0 packs/day for 62 years  . Smokeless tobacco: Not on file  . Alcohol Use: No     Quit 1985  . Drug Use: No  . Sexually Active: Not on file    Review of Systems: Gen: Denies fever, chills, loss of appetite, change in weight or weight loss CV: Denies heart palpitations, syncope, edema  Resp: Denies shortness of breath with rest, + cough GI: + dysphagia, no odynophagia. Denies vomiting blood, jaundice, and fecal incontinence.  GU : Denies urinary burning, urinary frequency, urinary incontinence.  MS: Denies joint pain,swelling, cramping Derm: Denies rash, itching, dry skin Psych:  Denies depression, anxiety,confusion, or memory loss Heme: Denies bruising, bleeding, and enlarged lymph nodes.  Physical Exam: Vital signs in last 24 hours: Temp:  [97.5 F (36.4 C)-97.6 F (36.4 C)] 97.5 F (36.4 C) (08/01 0736) Pulse Rate:  [64-88] 88  (08/01 0853) Resp:  [14-18] 14  (08/01 0853) BP: (118-147)/(76-88) 147/88 mmHg (08/01 0736) SpO2:  [92 %-100 %] 96 % (08/01 1158) Weight:  [189 lb (85.73 kg)] 189 lb (85.73 kg) (07/31 1731) Last BM Date: 06/04/11 General:   Alert,  Well-developed, well-nourished, pleasant and cooperative in NAD Head:  Normocephalic and atraumatic. Eyes:  Sclera clear, no icterus.   Conjunctiva pink. Ears:  Normal auditory acuity. Nose:  No deformity, discharge,  or lesions. Mouth:  No deformity or lesions, dentition normal. Neck:  Supple; no masses or thyromegaly. Lungs:  Scattered rhonchi, expiratory wheeze bilaterally posteriorly, coarse Heart:  S1, S2 present; no murmurs, clicks, rubs,  or gallops. Abdomen:  + BS, non-tender, soft. Non-distended. No HSM noted. No mass or hernias noted. No rebound or guarding.  Rectal:  Deferred Msk:  Symmetrical without gross deformities. Normal posture. Pulses:  Normal pulses noted. Extremities:  Without clubbing or edema. Neurologic:  Alert and  oriented x4;  grossly normal neurologically. Skin:  Intact without significant lesions or rashes. Cervical Nodes:  No significant cervical adenopathy. Psych:  Alert and cooperative. Normal mood and affect.  Intake/Output from previous day: 07/31 0701 - 08/01 0700 In: 600 [I.V.:600] Out: -  Intake/Output this shift:    Lab Results:  Basename 06/05/11 0550 06/04/11 1230  WBC 5.8 8.1  HGB 13.6 14.0  HCT 40.7 41.1  PLT 189 196   BMET  Basename 06/05/11 0550 06/04/11 1230  NA 134* 132*  K 3.9 4.5  CL 97 96  CO2 25 22  GLUCOSE 94  272*  BUN 15 16  CREATININE 1.04 0.95  CALCIUM 10.6* 11.9*   LFT  Basename 06/04/11 1230  PROT 7.5  ALBUMIN 3.5    AST 11  ALT 6  ALKPHOS 42  BILITOT 0.3  BILIDIR --  IBILI --   LIPASE:  7/31: 1313 8/1: 396  Studies/Results: Ct Abdomen Pelvis W Contrast  06/04/2011 Comparison: 10/11/2003  Findings: Postoperative changes in the mediastinum.  Lung bases are clear.  Surgical absence of the gallbladder.  No bile duct dilatation.  Diffuse low attenuation throughout the liver consistent with fatty infiltration.  Small hepatic cysts.  Normal spleen size.  Calcifications throughout the pancreas consistent with chronic pancreatitis.  There is a small amount of infiltration around the tail of the pancreas which could represent early changes of acute pancreatitis.  Correlation with laboratory values is recommended.  The adrenal glands, kidneys, proximal ureters, stomach, small bowel, and retroperitoneal lymph nodes are unremarkable.  There is fairly dense calcification of the abdominal aorta without aneurysm.  No free fluid or free air in the abdomen.  Pelvis:  The bladder wall is not thickened.  The prostate gland is mildly enlarged.  No free or loculated pelvic fluid collections. The appendix is normal.  Scattered diverticula in the sigmoid colon without inflammatory change.  Mild degenerative changes in the lumbar spine.  IMPRESSION: Chronic calcific pancreatitis.  There is minimal infiltration around the tail the pancreas which might represent early changes of acute pancreatitis.  Correlation with laboratory values recommended.  Diffuse fatty infiltration of the liver.  Surgically absent gallbladder.    Impression: 70 year old Caucasian male with presentation of acute on chronic pancreatitis,  thought to be related to ETOH in the past. Pt currently denies any alcohol use. He has not had an EUS for evaluation as requested in 2004; he has also not been taking pancreatic enzymes. Lipid panel pending. Vague dysphagia may likely be due to uncontrolled GERD, with other differentials including esophageal web, ring, or  stricture. No odynophagia, lack of appetite, or wt loss. Clinically improving, with lipase trending down.  At this time, Stephen [name] presents with uncomplicated acute on chronic pancreatitis with need for outpatient EUS in the future once discharged. In interim, bowel rest until abdominal pain improves, then advance to clear liquids and low fat diet as tolerated. Continue pain control, hydration. Will add PPI for GI prophylaxis. May convert to oral once diet resumed.   Plan: NPO for now, consider advancing to clear liquids this afternoon if pt continues to improve Supportive measures We will add Protonix IV daily (switch to po when diet advanced) Follow-up on lipid panel to assess for component of hypertriglyceridemia EUS as outpatient Screening colonoscopy 2013 Consider EGD as outpatient if continued dysphagia despite PPI therapy.   We will continue to follow Stephen [name] with you during this hospitalization. Thank you for the consult of this nice gentleman.     LOS: 1 day   Gerrit Halls  06/05/2011, 12:32 PM

## 2011-06-05 NOTE — Consult Note (Signed)
No nausea or vomiting. Pain about same as yesterday. Lipase is trending down. Pt requesting food.

## 2011-06-06 DIAGNOSIS — K859 Acute pancreatitis without necrosis or infection, unspecified: Secondary | ICD-10-CM

## 2011-06-06 DIAGNOSIS — K861 Other chronic pancreatitis: Secondary | ICD-10-CM

## 2011-06-06 LAB — PRO B NATRIURETIC PEPTIDE: Pro B Natriuretic peptide (BNP): 108.3 pg/mL (ref 0–125)

## 2011-06-06 LAB — GLUCOSE, CAPILLARY: Glucose-Capillary: 90 mg/dL (ref 70–99)

## 2011-06-06 MED ORDER — PANCRELIPASE (LIP-PROT-AMYL) 12000-38000 UNITS PO CPEP: 4.0000 | ORAL_CAPSULE | Freq: Three times a day (TID) | ORAL | Status: DC

## 2011-06-06 MED ORDER — SODIUM CHLORIDE 0.9 % IJ SOLN
INTRAMUSCULAR | Status: AC
  Administered 2011-06-06: 10 mL
  Filled 2011-06-06: qty 10

## 2011-06-06 MED ORDER — PANCRELIPASE (LIP-PROT-AMYL) 12000-38000 UNITS PO CPEP
2.0000 | ORAL_CAPSULE | Freq: Three times a day (TID) | ORAL | Status: DC
  Administered 2011-06-06: 2 via ORAL
  Filled 2011-06-06: qty 2

## 2011-06-06 MED ORDER — PANTOPRAZOLE SODIUM 40 MG PO TBEC: 40.0000 mg | DELAYED_RELEASE_TABLET | Freq: Every day | ORAL | Status: DC

## 2011-06-06 MED ORDER — OMEPRAZOLE MAGNESIUM 20 MG PO TBEC: 20.0000 mg | DELAYED_RELEASE_TABLET | Freq: Every day | ORAL | Status: DC

## 2011-06-06 MED ORDER — NICOTINE 14 MG/24HR TD PT24: 1.0000 | MEDICATED_PATCH | Freq: Every day | TRANSDERMAL | Status: AC | PRN

## 2011-06-06 NOTE — Progress Notes (Signed)
  Subjective: Stephen [name] is a 70 y.o. male has acute on chronic pancreatitis. Clinically feels better and wants to eat. No BM. No n/v. Denies worsening abdominal pain with clear liquids.   Objective: Vital signs in last 24 hours: Temp:  [97.5 F (36.4 C)-98.7 F (37.1 C)] 97.5 F (36.4 C) (08/02 0600) Pulse Rate:  [66-75] 73  (08/02 0600) Resp:  [20] 20  (08/02 0600) BP: (133-149)/(74-81) 133/81 mmHg (08/02 0600) SpO2:  [92 %-98 %] 96 % (08/02 1208) FiO2 (%):  [21 %] 21 % (08/01 1553) Weight:  [185 lb 13.6 oz (84.3 kg)] 185 lb 13.6 oz (84.3 kg) (08/02 0439) Last BM Date: 06/04/11 General:   Alert,  Well-developed, well-nourished, pleasant and cooperative in NAD Head:  Normocephalic and atraumatic. Eyes:  Sclera clear, no icterus.  Chest: CTA bilaterally without rales, rhonchi, crackles.    Heart:  Regular rate and rhythm; no murmurs, clicks, rubs,  or gallops. Abdomen:  Soft, minimal ruq tenderness, nondistended. No masses, hepatosplenomegaly or hernias noted. Normal bowel sounds, without guarding, and without rebound.   Extremities:  Without clubbing, deformity or edema. Neurologic:  Alert and  oriented x4;  grossly normal neurologically. Skin:  Intact without significant lesions or rashes. Psych:  Alert and cooperative. Normal mood and affect.  Intake/Output from previous day: 08/01 0701 - 08/02 0700 In: 1854 [P.O.:240; I.V.:1614] Out: 1600 [Urine:1600] Intake/Output this shift: I/O this shift: In: 520 [P.O.:520] Out: -   Lab Results: CBC  Basename 06/05/11 0550 06/04/11 1230  WBC 5.8 8.1  HGB 13.6 14.0  HCT 40.7 41.1  MCV 91.3 90.3  PLT 189 196   BMET  Basename 06/05/11 0550 06/04/11 1230  NA 134* 132*  K 3.9 4.5  CL 97 96  CO2 25 22  GLUCOSE 94 272*  BUN 15 16  CREATININE 1.04 0.95  CALCIUM 10.6* 11.9*   LFTs  Basename 06/04/11 1230  BILITOT 0.3  BILIDIR --  IBILI --  ALKPHOS 42  AST 11  ALT 6  PROT 7.5  ALBUMIN 3.5   Triglyceride level:  443  Basename 06/06/11 0540 06/05/11 0550 06/04/11 1230  LIPASE 111* 396* 1313*    Assessment: 70 year old Caucasian male with acute on chronic pancreatitis, thought to be related to ETOH in the past. Pt denies etoh use since the 1980s. Lost to f/u for several years.  Triglycerides were elevated but not likely source of pancreatitis. Lipase down today, patient ready to eat. Dysphagia, possible due to uncontrolled GERD.   Plan:  Advance to low-fat, carb modified diet. Add pancreatic enzymes. Change Protonix to PO. MRCP in couple of weeks to evaluate pancrease and CBD. Discussed with Dr. Darrick Penna. Screening colonoscopy 2013  Consider EGD as outpatient if continued dysphagia despite PPI therapy.      LOS: 2 days   Tana Coast  06/06/2011, 1:07 PM

## 2011-06-06 NOTE — Discharge Summary (Signed)
Discharge Summary  Stephen [name]  MR#: [id number]  DOB:1941-07-15  Date of Admission: 06/04/2011 Date of Discharge: 06/06/2011  Attending Physician:Gelisa Tieken K  Patient's PCP: Edilia Bo, PA  Consults: Treatment Team:  Arlyce Harman, MD, GI  Discharge Diagnoses: Acute pancreatitis  Present on Admission:   .Hyponatremia .Hypercalcemia .HTN (hypertension) .Chronic systolic congestive heart failure .Secondary DM with complication    Discharge Medications Current Discharge Medication List    START taking these medications   Details  lipase/protease/amylase (CREON-10/PANCREASE) 12000 UNITS CPEP Take 4 capsules by mouth 3 (three) times daily before meals. Qty: 270 capsule, Refills: 0   Associated Diagnoses: Pancreatitis    nicotine (NICODERM CQ - DOSED IN MG/24 HOURS) 14 mg/24hr patch Place 1 patch onto the skin daily as needed (For smoking cessation). Qty: 28 patch, Refills: 0    omeprazole (PRILOSEC OTC) 20 MG tablet Take 1 tablet (20 mg total) by mouth daily. Qty: 28 tablet, Refills: 1      CONTINUE these medications which have NOT CHANGED   Details  albuterol-ipratropium (COMBIVENT) 18-103 MCG/ACT inhaler Inhale 2 puffs into the lungs every 8 (eight) hours as needed.      fenofibrate 160 MG tablet Take 160 mg by mouth daily.      glipiZIDE (GLUCOTROL XL) 2.5 MG 24 hr tablet Take 2.5 mg by mouth daily.      HYDROcodone-acetaminophen (VICODIN) 5-500 MG per tablet Take 1 tablet by mouth 3 (three) times daily as needed. FOR LEG PAIN     Insulin Glargine (LANTUS SOLOSTAR [place]) Inject 20 Units into the skin at bedtime.      lisinopril (PRINIVIL,ZESTRIL) 20 MG tablet Take 20 mg by mouth daily.      metFORMIN (GLUCOPHAGE) 1000 MG tablet Take 1,000 mg by mouth daily with breakfast.      metoprolol tartrate (LOPRESSOR) 25 MG tablet Take 50 mg by mouth daily.      spironolactone (ALDACTONE) 25 MG tablet Take 25 mg by mouth daily.          Hospital  Course: Acute pancreatitis: Patient presented with mid epigastric pain as well as nausea vomiting. He was found to have markedly elevated lipase level above 1100. Patient was made n.p.o. put on IV fluids CT examination confirmed pancreatitis but also signs of calcifications within the pancreas suggesting a chronic burned-out pancreas from years ago. It's curious as to why he was having some flareup of pancreatitis as he's not had one in some time. She is counseled as well the next several days patient's lipase continued to decline. By 06/06/2011 his level was down to 111. He asked for food and tolerated clear liquids in the morning. He tolerated solid food by lunchtime and felt to be medically ready for discharge. Dr. Darrick Penna and evaluation felt concerned that limited findings on his CT could not confirm or deny a small pancreatic mass in the head and plan is for an MRCP as an outpatient in the next week or 2. GI will set this up. In discussion with the patient many years ago he used to drink quite heavily, about one fifth of liquor every morning which went on for several years. 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  Present on Admission:   .Hyponatremia-resolved secondary to dehydration  .Hypercalcemia: Results of pancreatitis resolved.  Marland KitchenHTN (hypertension): Stable during this admission  .Chronic systolic congestive heart failure: Stable during this admission minimal IV fluids given  .Secondary DM  with complication: CABG started to increase this patient's by mouth was resumed he's going home on his home medications.  Day of Discharge BP 133/81  Pulse 73  Temp(Src) 97.5 F (36.4 C) (Oral)  Resp 20  Ht 5\' 11"  (1.803 m)  Wt 84.3 kg (185 lb 13.6 oz)  BMI 25.92 kg/m2  SpO2 96%  Physical Exam: General appearance: alert, cooperative, appears stated age and no distress  Head: Normocephalic, without obvious abnormality, atraumatic   Lungs: Decreased breath sounds throughout  Chest wall: no tenderness  Heart: Regular rate and rhythm with a soft 2/6 systolic ejection murmur.  Abdomen: Soft, obese, distended, hypoactive bowel sounds, tenderness resolved Extremities: extremities normal, atraumatic, no cyanosis or edema  Pulses: 2+ and symmetric  Skin: Skin color, texture, turgor normal. No rashes or lesions     Disposition: Improved   Diet: Carb modified, low fat and heart healthy.  Activity: Slow to increase   Follow-up Appts: Discharge Orders    Future Appointments: Provider: Department: Dept Phone: Center:   08/07/2011 10:00 AM Mikey Bussing, MD [facility] [phone] [facility]     Future Orders Please Complete By Expires   Diet - low sodium heart healthy      Increase activity slowly        patient will follow up with Dr. Andrey [facility] in the next 1-2 weeks  Patient will see Dr. Edilia Bo, PCP in about a month  TESTS THAT NEED FOLLOW-UP Outpatient MRCP to be set up by GI  Signed: Hollice Espy 06/06/2011, 2:48 PM

## 2011-06-06 NOTE — Progress Notes (Signed)
Reviewed d/c instructions & medications with client, client d/c to home with family.

## 2011-06-06 NOTE — Progress Notes (Signed)
agree

## 2011-06-07 ENCOUNTER — Telehealth: Payer: Self-pay | Admitting: Gastroenterology

## 2011-06-07 DIAGNOSIS — K859 Acute pancreatitis without necrosis or infection, unspecified: Secondary | ICD-10-CM

## 2011-06-07 NOTE — Telephone Encounter (Signed)
Please schedule patient for MRCP in 3-4 weeks. Reason, acute on chronic pancreatitis. R/O pancreatic tumor and evaluate common bile duct.  Patient needs OV with [place] a couple days after MRCP done.

## 2011-06-10 NOTE — Telephone Encounter (Signed)
Please schedule

## 2011-06-10 NOTE — Telephone Encounter (Signed)
Pt is scheduled for office visit on 06/20/2011 with Despina Arias.  He is also scheduled for his MRCP 06/18/2011 to arrive at 8:30am.  I called and lmom for pt to call back to confirm these appts.

## 2011-06-18 ENCOUNTER — Ambulatory Visit (HOSPITAL_COMMUNITY)
Admission: RE | Admit: 2011-06-18 | Discharge: 2011-06-18 | Disposition: A | Discharge status: Home / Self Care | Payer: Medicare Other | Source: Ambulatory Visit | Attending: Gastroenterology | Admitting: Gastroenterology

## 2011-06-18 ENCOUNTER — Other Ambulatory Visit: Payer: Self-pay | Admitting: Gastroenterology

## 2011-06-18 DIAGNOSIS — R932 Abnormal findings on diagnostic imaging of liver and biliary tract: Secondary | ICD-10-CM | POA: Insufficient documentation

## 2011-06-18 DIAGNOSIS — R109 Unspecified abdominal pain: Secondary | ICD-10-CM | POA: Insufficient documentation

## 2011-06-18 DIAGNOSIS — K859 Acute pancreatitis without necrosis or infection, unspecified: Secondary | ICD-10-CM

## 2011-06-18 DIAGNOSIS — K5710 Diverticulosis of small intestine without perforation or abscess without bleeding: Secondary | ICD-10-CM | POA: Insufficient documentation

## 2011-06-18 DIAGNOSIS — K861 Other chronic pancreatitis: Secondary | ICD-10-CM | POA: Insufficient documentation

## 2011-06-18 MED ORDER — GADOBENATE DIMEGLUMINE 529 MG/ML IV SOLN
19.0000 mL | Freq: Once | INTRAVENOUS | Status: AC | PRN
  Administered 2011-06-18: 19 mL via INTRAVENOUS

## 2011-06-20 ENCOUNTER — Ambulatory Visit (INDEPENDENT_AMBULATORY_CARE_PROVIDER_SITE_OTHER): Payer: Medicare Other | Admitting: Gastroenterology

## 2011-06-20 ENCOUNTER — Encounter: Payer: Self-pay | Admitting: Gastroenterology

## 2011-06-20 VITALS — BP 194/89 | HR 92 | Temp 98.1°F | Ht 70.0 in | Wt 190.6 lb

## 2011-06-20 DIAGNOSIS — K861 Other chronic pancreatitis: Secondary | ICD-10-CM | POA: Insufficient documentation

## 2011-06-20 DIAGNOSIS — K859 Acute pancreatitis without necrosis or infection, unspecified: Secondary | ICD-10-CM

## 2011-06-20 MED ORDER — PANCRELIPASE (LIP-PROT-AMYL) 24000-76000 UNITS PO CPEP: 3.0000 | ORAL_CAPSULE | Freq: Three times a day (TID) | ORAL | Status: DC

## 2011-06-20 MED ORDER — OMEPRAZOLE MAGNESIUM 20 MG PO TBEC: 20.0000 mg | DELAYED_RELEASE_TABLET | Freq: Every day | ORAL | Status: DC

## 2011-06-20 NOTE — Progress Notes (Signed)
Referring Provider: Edilia Bo, PA Primary Care Physician:  Edilia Bo, PA  Chief Complaint  Patient presents with  . Follow-up    HPI:   Stephen [name] is a 70 year old Caucasian male who presents today in hospital follow-up. Has hx of pancreatitis, likely r/t ETOH use in the past. Was hospitalized recently due to acute on chronic pancreatitis. He was reportedly supposed to have an EUS in the remote past, but he never followed up with this. He had also quit taking his pancreatic supplements. During his hospitalization, he reported vague dysphagia. He was started on a PPI. He had an MRCP prior to this office visit, which showed chronic pancreatitis, NO pancreatic mass or acute pancreatitis. Mild dilatation of CBD, which is thought to be r/t prior cholecystectomy. No choledocholithiasis or intrahepatic ductal dilatation.   Returns today doing quite well. No nausea or vomiting. No abdominal pain. Denies reflux. No dysphagia. Having a BM one to two times per day. No melena or brbpr. Good appetite. No fever or chills. He is taking his Creon as prescribed as well as Prilosec. He continues to abstain from ETOH.   Past Medical History  Diagnosis Date  . Diabetes mellitus   . Hypertension   . Heart disease   . Pancreatitis   . Neuropathy   . Hyperlipemia   . Peripheral vascular disease   . GERD (gastroesophageal reflux disease)   . NSTEMI (non-ST elevated myocardial infarction)   . S/P colonoscopy July 2003    hyperplastic polyp, diverticulosis, anal papilla and internal hemorrhoids r  . S/P endoscopy 2003    Dr. Tish Men: erosive reflux esophagitis , PUD  . PUD (peptic ulcer disease)     diagnosed via EGD by Dr. Tish Men at [facility]    Past Surgical History  Procedure Date  . 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 Right inguinal hernia repair   . Cholecystectomy     3-4 years ago  . Appendectomy     age 72  . Cataracts     Current Outpatient Prescriptions    Medication Sig Dispense Refill  . albuterol-ipratropium (COMBIVENT) 18-103 MCG/ACT inhaler Inhale 2 puffs into the lungs every 8 (eight) hours as needed.        . fenofibrate 160 MG tablet Take 160 mg by mouth daily.        [name] glipiZIDE (GLUCOTROL XL) 2.5 MG 24 hr tablet Take 2.5 mg by mouth daily.        [name] HYDROcodone-acetaminophen (VICODIN) 5-500 MG per tablet Take 1 tablet by mouth 3 (three) times daily as needed. FOR LEG PAIN       . Insulin Glargine (LANTUS SOLOSTAR [place]) Inject 20 Units into the skin at bedtime.        . lipase/protease/amylase (CREON-10/PANCREASE) 12000 UNITS CPEP Take 4 capsules by mouth 3 (three) times daily before meals.  270 capsule  0  . lisinopril (PRINIVIL,ZESTRIL) 20 MG tablet Take 20 mg by mouth daily.        . metFORMIN (GLUCOPHAGE) 1000 MG tablet Take 1,000 mg by mouth daily with breakfast.        . metoprolol tartrate (LOPRESSOR) 25 MG tablet Take 50 mg by mouth daily.        . nicotine (NICODERM CQ - DOSED IN MG/24 HOURS) 14 mg/24hr patch Place 1 patch onto the skin daily as needed (For smoking cessation).  28 patch  0  . omeprazole (PRILOSEC OTC) 20 MG tablet Take 1 tablet (20  mg total) by mouth daily.  28 tablet  1  . spironolactone (ALDACTONE) 25 MG tablet Take 25 mg by mouth daily.          Allergies as of 06/20/2011  . (No Known Allergies)    Family History  Problem Relation Age of Onset  . Coronary artery disease Mother   . Coronary artery disease Father   . Diabetes Sister   . Suicidality Brother   . Cirrhosis Brother   . Colon cancer Neg Hx     History   Social History  . Marital Status: Married    Spouse Name: N/A    Number of Children: N/A  . Years of Education: N/A   Social History Main Topics  . Smoking status: Current Everyday Smoker -- 1.0 packs/day for 62 years    Types: Cigarettes  . Smokeless tobacco: None   Comment: on last pack now then will quit  . Alcohol Use: No     Quit 1985  . Drug Use: No     Review of  Systems: Gen: Denies fever, chills, anorexia. Denies fatigue, weakness, weight loss.  CV: Denies chest pain, palpitations, syncope, peripheral edema, and claudication. Resp: Denies dyspnea at rest, cough, wheezing, coughing up blood, and pleurisy. GI: Denies vomiting blood, jaundice, and fecal incontinence.   Denies dysphagia or odynophagia. Derm: Denies rash, itching, dry skin Psych: Denies depression, anxiety, memory loss, confusion. No homicidal or suicidal ideation.  Heme: Denies bruising, bleeding, and enlarged lymph nodes.  Physical Exam: BP 194/89  Pulse 92  Temp(Src) 98.1 F (36.7 C) (Temporal)  Ht 5\' 10"  (1.778 m)  Wt 190 lb 9.6 oz (86.456 kg)  BMI 27.35 kg/m2 General:   Alert and oriented. No distress noted. Pleasant and cooperative.  Head:  Normocephalic and atraumatic. Eyes:  Conjuctiva clear without scleral icterus. Mouth:  Oral mucosa pink and moist. Poor dentition, no lesions Neck:  Supple, without mass or thyromegaly. Heart:  S1, S2 present without murmurs, rubs, or gallops. Regular rate and rhythm. Abdomen:  +BS, soft, non-tender and non-distended. Somewhat protuberant. Small ventral hernia below xiphoid process.  No rebound or guarding. No HSM or masses noted. Msk:  Symmetrical without gross deformities. Normal posture. Extremities:  RLE ankle with trace more edema than left. Chronic.  Neurologic:  Alert and  oriented x4;  grossly normal neurologically. Skin:  Intact without significant lesions or rashes. Cervical Nodes:  No significant cervical adenopathy. Psych:  Alert and cooperative. Normal mood and affect.

## 2011-06-20 NOTE — Progress Notes (Signed)
Cc to PCP 

## 2011-06-20 NOTE — Assessment & Plan Note (Signed)
70 year old Caucasian male with recent acute on chronic pancreatitis requiring admission to [facility]. Here for follow-up, doing excellent. Completely asymptomatic, and he continues to take Creon and Prilosec as prescribed. Dysphagia has resolved as well, reflux well-controlled. MRCP without any pancreatic mass, findings consistent with chronic pancreatitis, mild CBD dilatation likely secondary to cholecystectomy. LFTs were normal while inpatient. Hx pancreatitis thought to be secondary to ETOH. Will d/w Dr. Darrick Penna any further work-up, although MRCP is reassuring.  Continue Creon  Continue omeprazole Follow low-fat diet Return in 6 months or sooner if needed

## 2011-06-20 NOTE — Patient Instructions (Signed)
Continue taking the pancreas enzymes as prescribed. We have sent a new prescription to your pharmacy.   Continue taking omeprazole daily, 30 minutes before breakfast.   We will see you back in 6 months.   Please contact us if any further pain, nausea or vomiting.

## 2011-06-24 NOTE — Progress Notes (Signed)
Quick Note:  FYI, patient did not tolerate all sequences of MRCP due to claustrophobia. Patient subsequently seen by AS in follow-up. Will address with [facility]/SLF regarding any need for further w/u. ______

## 2011-07-01 NOTE — Progress Notes (Signed)
MRCP w/o evidence for CBD or PD stone. Pt has chronic calcific pancreatitis. No additional imaging necessary. NEXT MRI OPEN DUE TO CLAUSTROPHOBIA.

## 2011-07-04 NOTE — Progress Notes (Signed)
Quick Note:  Noted. Addressed in epic. ______

## 2011-07-04 NOTE — Progress Notes (Signed)
Encounter addended by: Clarene Critchley on: 07/04/2011  8:17 AM<BR>     Documentation filed: Flowsheet VN

## 2011-07-30 ENCOUNTER — Other Ambulatory Visit: Payer: Self-pay | Admitting: Cardiothoracic Surgery

## 2011-07-30 DIAGNOSIS — I2510 Atherosclerotic heart disease of native coronary artery without angina pectoris: Secondary | ICD-10-CM

## 2011-08-07 ENCOUNTER — Encounter: Payer: Self-pay | Admitting: Cardiothoracic Surgery

## 2011-08-07 ENCOUNTER — Ambulatory Visit: Payer: Medicare Other | Admitting: Cardiothoracic Surgery

## 2011-08-07 ENCOUNTER — Ambulatory Visit (INDEPENDENT_AMBULATORY_CARE_PROVIDER_SITE_OTHER): Payer: Medicare Other | Admitting: Cardiothoracic Surgery

## 2011-08-07 VITALS — BP 192/94 | HR 104 | Resp 18 | Ht 71.0 in | Wt 190.0 lb

## 2011-08-07 DIAGNOSIS — K439 Ventral hernia without obstruction or gangrene: Secondary | ICD-10-CM

## 2011-08-07 DIAGNOSIS — I2510 Atherosclerotic heart disease of native coronary artery without angina pectoris: Secondary | ICD-10-CM

## 2011-08-07 NOTE — Patient Instructions (Signed)
Avoid heavy lifting,stop smoking,lose weight and return if the hernia becomes more symptomatic.

## 2011-08-07 NOTE — Progress Notes (Signed)
HPI Patient returns for 6 month followup of a small incisional ventral hernia approximately 2 finger breaths in diameter just below the lower aspect of the sternal incision. The patient continues to smoke. Small hernia has been asymptomatic.   Current Outpatient Prescriptions  Medication Sig Dispense Refill  . albuterol-ipratropium (COMBIVENT) 18-103 MCG/ACT inhaler Inhale 2 puffs into the lungs every 8 (eight) hours as needed.        . fenofibrate 160 MG tablet Take 160 mg by mouth daily.        [name] glipiZIDE (GLUCOTROL XL) 2.5 MG 24 hr tablet Take 2.5 mg by mouth daily.        [name] HYDROcodone-acetaminophen (VICODIN) 5-500 MG per tablet Take 1 tablet by mouth 3 (three) times daily as needed. FOR LEG PAIN       . Insulin Glargine (LANTUS SOLOSTAR [place]) Inject 20 Units into the skin at bedtime.        [name] lisinopril (PRINIVIL,ZESTRIL) 20 MG tablet Take 20 mg by mouth daily.        . metFORMIN (GLUCOPHAGE) 1000 MG tablet Take 1,000 mg by mouth daily with breakfast.        . metoprolol tartrate (LOPRESSOR) 25 MG tablet Take 50 mg by mouth daily.        [name] omeprazole (PRILOSEC OTC) 20 MG tablet Take 1 tablet (20 mg total) by mouth daily. Take 30 minutes before breakfast daily.  31 tablet  11  . Pancrelipase, Lip-Prot-Amyl, 24000 UNITS CPEP Take 3 capsules (72,000 Units total) by mouth 3 (three) times daily with meals. Take 2 with snacks.  280 capsule  3  . spironolactone (ALDACTONE) 25 MG tablet Take 25 mg by mouth daily.           Review of Systems: No fever or night sweats. No weight loss or weight gain. He denies angina.  Physical Exam Blood pressure 170/88 pulse 90 saturation room air 92% General appearance is that of a 70 year old Caucasian male appears older than his stated age. He is in no distress. Breath sounds are scattered rhonchi. The sternal incision is stable well-healed. Cardiac rhythm is regular without murmur. A stable 2 finger width incisional hernia at the lower sternal incision is  palpable but not tender. No peripheral edema on exam.  Diagnostic Tests: No chest x-ray for comparison today    IMPRESSION:    . Stable small incisional hernia which represents no risk for strangulation or incarceration. The patient was counseled to stop smoking, was weight, and to avoid lifting heavy objects. He return here as needed.

## 2011-09-17 ENCOUNTER — Telehealth: Payer: Self-pay

## 2011-09-17 NOTE — Telephone Encounter (Signed)
It was a Hyperplastic polyp approximately 3 x 3 x 2 mm

## 2011-09-17 NOTE — Telephone Encounter (Signed)
Pt is on the recall list for January. His last colonoscopy was July 09,2003. There was internal hemorrhoids and a single anal papilla and also three 5-mm diminutive polyps that was removed and biopsied.  There was nothing written on his path or operative report. When would you like to have he rescheduled? I have these reports.

## 2011-09-17 NOTE — Telephone Encounter (Signed)
What kind of polyp was removed previously

## 2011-09-18 NOTE — Telephone Encounter (Signed)
He will be due for routine screening 2013

## 2011-09-18 NOTE — Telephone Encounter (Signed)
Will send he a letter to call our office in January.

## 2011-09-20 ENCOUNTER — Encounter: Payer: Self-pay | Admitting: Internal Medicine

## 2011-10-04 ENCOUNTER — Other Ambulatory Visit: Payer: Self-pay

## 2011-10-04 MED ORDER — PANCRELIPASE (LIP-PROT-AMYL) 24000-76000 UNITS PO CPEP: 3.0000 | ORAL_CAPSULE | Freq: Three times a day (TID) | ORAL | Status: DC

## 2011-11-11 ENCOUNTER — Encounter: Payer: Self-pay | Admitting: Gastroenterology

## 2011-11-11 ENCOUNTER — Ambulatory Visit (INDEPENDENT_AMBULATORY_CARE_PROVIDER_SITE_OTHER): Payer: Medicare Other | Admitting: Gastroenterology

## 2011-11-11 VITALS — BP 110/70 | HR 75 | Temp 97.4°F | Ht 70.0 in | Wt 193.2 lb

## 2011-11-11 DIAGNOSIS — Z1211 Encounter for screening for malignant neoplasm of colon: Secondary | ICD-10-CM

## 2011-11-11 DIAGNOSIS — K861 Other chronic pancreatitis: Secondary | ICD-10-CM

## 2011-11-11 NOTE — Progress Notes (Signed)
Referring Provider: Wille Celeste, * Primary Care Physician:  Aniceto Boss, PA Primary Gastroenterologist: Dr. Darrick Penna   Chief Complaint  Patient presents with  . Follow-up    HPI:   Stephen [name] is a 71 year old male with a hx of chronic pancreatitis, thought to be related to ETOH use in the past. He had an MRCP in August 2012 with findings of chronic pancreatitis, no mass or acute pancreatitis. Mild dilatation of CBD was thought to be related to prior cholecystectomy. No further imaging required per Dr. Darrick Penna, noted in epic.  He returns today in routine follow-up; he is actually due for routine screening colonoscopy this year. He returns today stating he is doing well. Reports intermittent abdominal discomfort if he doesn't take his capsules. Denies nausea and vomiting. Denies dysphagia. No hematochezia.   Past Medical History  Diagnosis Date  . Diabetes mellitus   . Hypertension   . Heart disease   . Pancreatitis   . Neuropathy   . Hyperlipemia   . Peripheral vascular disease   . GERD (gastroesophageal reflux disease)   . NSTEMI (non-ST elevated myocardial infarction)   . S/P colonoscopy July 2003    hyperplastic polyp, diverticulosis, anal papilla and internal hemorrhoids r  . S/P endoscopy 2003    Dr. Tish Men: erosive reflux esophagitis , PUD  . PUD (peptic ulcer disease)     diagnosed via EGD by Dr. Tish Men at [facility]    Past Surgical History  Procedure Date  . 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 Right inguinal hernia repair   . Cholecystectomy     3-4 years ago  . Appendectomy     age 76  . Cataracts     Current Outpatient Prescriptions  Medication Sig Dispense Refill  . albuterol-ipratropium (COMBIVENT) 18-103 MCG/ACT inhaler Inhale 2 puffs into the lungs every 8 (eight) hours as needed.        . fenofibrate 160 MG tablet Take 160 mg by mouth daily.        [name] glipiZIDE (GLUCOTROL XL) 2.5 MG 24 hr tablet Take 2.5 mg by mouth daily.         [name] HYDROcodone-acetaminophen (VICODIN) 5-500 MG per tablet Take 1 tablet by mouth 3 (three) times daily as needed. FOR LEG PAIN       . Insulin Glargine (LANTUS SOLOSTAR [place]) Inject 20 Units into the skin at bedtime.        [name] lisinopril (PRINIVIL,ZESTRIL) 20 MG tablet Take 20 mg by mouth daily.        . metFORMIN (GLUCOPHAGE) 1000 MG tablet Take 1,000 mg by mouth daily with breakfast.        . metoprolol tartrate (LOPRESSOR) 25 MG tablet Take 50 mg by mouth daily.        [name] omeprazole (PRILOSEC OTC) 20 MG tablet Take 1 tablet (20 mg total) by mouth daily. Take 30 minutes before breakfast daily.  31 tablet  11  . Pancrelipase, Lip-Prot-Amyl, 24000 UNITS CPEP Take 3 capsules (72,000 Units total) by mouth 3 (three) times daily with meals. Take 2 with snacks.  400 capsule  3  . spironolactone (ALDACTONE) 25 MG tablet Take 25 mg by mouth daily.          Allergies as of 11/11/2011  . (No Known Allergies)    Family History  Problem Relation Age of Onset  . Coronary artery disease Mother   . Coronary artery disease Father   . Diabetes Sister   .  Suicidality Brother   . Cirrhosis Brother   . Colon cancer Neg Hx     History   Social History  . Marital Status: Married    Spouse Name: N/A    Number of Children: N/A  . Years of Education: N/A   Social History Main Topics  . Smoking status: Current Everyday Smoker -- 0.5 packs/day for 62 years    Types: Cigarettes  . Smokeless tobacco: None   Comment: on last pack now then will quit  . Alcohol Use: No     Quit 1985  . Drug Use: No  . Sexually Active: None   Other Topics Concern  . None   Social History Narrative  . None    Review of Systems: Gen: Denies fever, chills, anorexia. Denies fatigue, weakness, weight loss.  CV: Denies chest pain, palpitations, syncope, peripheral edema, and claudication. Resp: Denies dyspnea at rest, cough, wheezing, coughing up blood, and pleurisy. GI: Denies vomiting blood, jaundice, and fecal  incontinence.   Denies dysphagia or odynophagia. Derm: Denies rash, itching, dry skin Psych: Denies depression, anxiety, memory loss, confusion. No homicidal or suicidal ideation.  Heme: Denies bruising, bleeding, and enlarged lymph nodes.  Physical Exam: BP 110/70  Pulse 75  Temp(Src) 97.4 F (36.3 C) (Temporal)  Ht 5\' 10"  (1.778 m)  Wt 193 lb 3.2 oz (87.635 kg)  BMI 27.72 kg/m2 General:   Alert and oriented. No distress noted. Pleasant and cooperative.  Head:  Normocephalic and atraumatic. Eyes:  Conjuctiva clear without scleral icterus. Mouth:  Oral mucosa pink and moist. Poor dentition Heart:  S1, S2 present without murmurs, rubs, or gallops. Regular rate and rhythm. Lungs: inspiratory and expiratory wheeze noted bilaterally Abdomen:  +BS, soft, non-tender and non-distended. No rebound or guarding. No HSM or masses noted. Msk:  Symmetrical without gross deformities. Normal posture. Extremities:  Without edema. Neurologic:  Alert and  oriented x4;  grossly normal neurologically. Skin:  Intact without significant lesions or rashes. Psych:  Alert and cooperative. Normal mood and affect.

## 2011-11-11 NOTE — Assessment & Plan Note (Signed)
Last colonoscopy in 2003 with hyperplastic polyp. Due for repeat in June/July 2013. Will come back in June for office visit prior to setting up.

## 2011-11-11 NOTE — Assessment & Plan Note (Signed)
71 year old male with hx of pancreatitis likely secondary to ETOH abuse in past. Continues to do well on pancreatic enzymes and PPI therapy. MRCP with findings as in HPI, no need for further imaging. He will return in June to set up routine screening colonoscopy.

## 2011-11-11 NOTE — Patient Instructions (Signed)
Continue to take the pancreatic enzymes and Prilosec daily.  We will see you back in June to set up a colonoscopy.   Please contact us if you have any questions!

## 2011-11-12 NOTE — Progress Notes (Signed)
Cc to PCP 

## 2011-11-15 ENCOUNTER — Encounter (HOSPITAL_COMMUNITY): Payer: Self-pay

## 2011-11-15 ENCOUNTER — Emergency Department (HOSPITAL_COMMUNITY)
Admission: EM | Admit: 2011-11-15 | Discharge: 2011-11-15 | Discharge status: Against Medical Advice | Payer: Medicare Other | Attending: Emergency Medicine | Admitting: Emergency Medicine

## 2011-11-15 DIAGNOSIS — L723 Sebaceous cyst: Secondary | ICD-10-CM | POA: Insufficient documentation

## 2011-11-15 NOTE — ED Notes (Signed)
Notified by front desk that pt LWBS and pt refused to sign AMA form.

## 2011-11-15 NOTE — ED Notes (Signed)
Pt presents with "knot" to right arm x 3 weeks.

## 2011-11-19 ENCOUNTER — Encounter: Payer: Self-pay | Admitting: Internal Medicine

## 2011-12-24 ENCOUNTER — Telehealth: Payer: Self-pay | Admitting: Cardiology

## 2011-12-24 NOTE — Telephone Encounter (Signed)
06/05/11 & 10/02/11  mailed reminder to schedule f/u 

## 2012-01-28 ENCOUNTER — Other Ambulatory Visit: Payer: Self-pay

## 2012-01-28 MED ORDER — PANCRELIPASE (LIP-PROT-AMYL) 24000-76000 UNITS PO CPEP: 3.0000 | ORAL_CAPSULE | Freq: Three times a day (TID) | ORAL | Status: DC

## 2012-03-24 ENCOUNTER — Ambulatory Visit (INDEPENDENT_AMBULATORY_CARE_PROVIDER_SITE_OTHER): Payer: Medicare Other | Admitting: Internal Medicine

## 2012-03-24 ENCOUNTER — Encounter: Payer: Self-pay | Admitting: Internal Medicine

## 2012-03-24 DIAGNOSIS — Z1211 Encounter for screening for malignant neoplasm of colon: Secondary | ICD-10-CM

## 2012-03-24 DIAGNOSIS — R6881 Early satiety: Secondary | ICD-10-CM

## 2012-03-24 DIAGNOSIS — R1310 Dysphagia, unspecified: Secondary | ICD-10-CM | POA: Insufficient documentation

## 2012-03-24 NOTE — Patient Instructions (Addendum)
EGD w possible ED - dysphagia and early satiey  Plus screening colonoscopy  Decrease lantus insulin to 18 units night before procedure.

## 2012-03-24 NOTE — Progress Notes (Signed)
Primary Care Physician:  Lajean Saver, NT, Nursing/ Tech I Primary Gastroenterologist:  Dr.   Pre-Procedure History & Physical: HPI:  Stephen [name] is a 71 y.o. male here for consideration screening colonoscopy. Continue since he last had one. Hyperplastic polyps and diverticulosis at that time. No bowel symptoms these days. Has history of acute on chronic pancreatitis. Prior EtOH abuser. MRCP previously demonstrated a mildly dilated the bile duct and changes of chronic pancreatitis.  He was seen by our  group in the hospital last July. He was scheduled to have an EUS but never followed up. He also reported dysphagia; he continues to have some esophageal dysphagia to solids and early satiety. Peptic ulcer disease demonstrated at EGD by Dr. Cleotis Nipper  some 10 years ago.  Denies weight loss, melena or hematochezia. He denies history of GI malignancy.  Long history of alcohol and tobacco exposure.  Is not really having any reflux symptoms (on omeprazole and pancreatic enzyme supplements).  Past Medical History  Diagnosis Date  . Diabetes mellitus   . Hypertension   . Heart disease   . Pancreatitis   . Neuropathy   . Hyperlipemia   . Peripheral vascular disease   . GERD (gastroesophageal reflux disease)   . NSTEMI (non-ST elevated myocardial infarction)   . S/P colonoscopy July 2003    hyperplastic polyp, diverticulosis, anal papilla and internal hemorrhoids r  . S/P endoscopy 2003    Dr. Tish Men: erosive reflux esophagitis , PUD  . PUD (peptic ulcer disease)     diagnosed via EGD by Dr. Tish Men at [place]  . Diverticulosis   . Hemorrhoids     Past Surgical History  Procedure Date  . 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 Right inguinal hernia repair   . Cholecystectomy     3-4 years ago  . Appendectomy     age 65  . Cataracts   . Colonoscopy 05/12/2002    Dr. Jena Gauss- diverticulosis, anal papilla, internal hemorrhoids, rectal polyps    Prior to Admission  medications   Medication Sig Start Date End Date Taking? Authorizing Provider  albuterol-ipratropium (COMBIVENT) 18-103 MCG/ACT inhaler Inhale 2 puffs into the lungs every 8 (eight) hours as needed.     Yes Historical Provider, MD  fenofibrate 160 MG tablet Take 160 mg by mouth daily.     Yes Historical Provider, MD  glipiZIDE (GLUCOTROL XL) 2.5 MG 24 hr tablet Take 2.5 mg by mouth daily.     Yes Historical Provider, MD  HYDROcodone-acetaminophen (VICODIN) 5-500 MG per tablet Take 1 tablet by mouth 3 (three) times daily as needed. FOR LEG PAIN    Yes Historical Provider, MD  Insulin Glargine (LANTUS SOLOSTAR [place]) Inject 25 Units into the skin at bedtime.    Yes Historical Provider, MD  lisinopril (PRINIVIL,ZESTRIL) 20 MG tablet Take 20 mg by mouth daily.     Yes Historical Provider, MD  metFORMIN (GLUCOPHAGE) 1000 MG tablet Take 1,000 mg by mouth daily with breakfast.     Yes Historical Provider, MD  metoprolol tartrate (LOPRESSOR) 25 MG tablet Take 50 mg by mouth daily.     Yes Historical Provider, MD  omeprazole (PRILOSEC OTC) 20 MG tablet Take 1 tablet (20 mg total) by mouth daily. Take 30 minutes before breakfast daily. 06/20/11 06/19/12 Yes Nira Retort, NP  Pancrelipase, Lip-Prot-Amyl, 24000 UNITS CPEP Take 3 capsules (72,000 Units total) by mouth 3 (three) times daily with meals. Take 2 with snacks. 01/28/12  Yes Joselyn Arrow, NP  spironolactone (ALDACTONE) 25 MG tablet Take 25 mg by mouth daily.     Yes Historical Provider, MD    Allergies as of 03/24/2012  . (No Known Allergies)    Family History  Problem Relation Age of Onset  . Coronary artery disease Mother   . Coronary artery disease Father   . Diabetes Sister   . Suicidality Brother   . Cirrhosis Brother   . Colon cancer Neg Hx     History   Social History  . Marital Status: Married    Spouse Name: N/A    Number of Children: N/A  . Years of Education: N/A   Occupational History  . Not on file.   Social History Main  Topics  . Smoking status: Current Everyday Smoker -- 0.5 packs/day for 62 years    Types: Cigarettes  . Smokeless tobacco: Not on file   Comment: on last pack now then will quit  . Alcohol Use: No     Quit 1985  . Drug Use: No  . Sexually Active: Not on file   Other Topics Concern  . Not on file   Social History Narrative  . No narrative on file    Review of Systems: See HPI, otherwise negative ROS  Physical Exam: BP 112/65  Pulse 97  Temp(Src) 98.1 F (36.7 C) (Temporal)  Ht 5\' 10"  (1.778 m)  Wt 181 lb 6.4 oz (82.283 kg)  BMI 26.03 kg/m2 General:   Alert,  Well-developed, well-nourished, pleasant and cooperative in NAD  edentulous Skin:  Intact without significant lesions or rashes. Eyes:  Sclera clear, no icterus.   Conjunctiva pink. Ears:  Normal auditory acuity. Nose:  No deformity, discharge,  or lesions. Mouth:  No deformity or lesions. Neck:  Supple; no masses or thyromegaly. No significant cervical adenopathy. Lungs:  Clear throughout to auscultation.   No wheezes, crackles, or rhonchi. No acute distress. Heart:  Regular rate and rhythm; no murmurs, clicks, rubs,  or gallops. Abdomen: Non-distended, normal bowel sounds.  Soft and nontender without appreciable mass or hepatosplenomegaly.  Pulses:  Normal pulses noted. Extremities:  Without clubbing or edema.

## 2012-03-24 NOTE — Progress Notes (Signed)
Primary Care Physician:  Lajean Saver, NT, Nursing/ Tech I Primary Gastroenterologist:  Dr.   Pre-Procedure History & Physical: HPI:  Stephen [name] is a 71 y.o. male here for   Past Medical History  Diagnosis Date  . Diabetes mellitus   . Hypertension   . Heart disease   . Pancreatitis   . Neuropathy   . Hyperlipemia   . Peripheral vascular disease   . GERD (gastroesophageal reflux disease)   . NSTEMI (non-ST elevated myocardial infarction)   . S/P colonoscopy July 2003    hyperplastic polyp, diverticulosis, anal papilla and internal hemorrhoids r  . S/P endoscopy 2003    Dr. Tish Men: erosive reflux esophagitis , PUD  . PUD (peptic ulcer disease)     diagnosed via EGD by Dr. Tish Men at [facility]  . Diverticulosis   . Hemorrhoids     Past Surgical History  Procedure Date  . 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 Right inguinal hernia repair   . Cholecystectomy     3-4 years ago  . Appendectomy     age 89  . Cataracts   . Colonoscopy 05/12/2002    Dr. Jena Gauss- diverticulosis, anal papilla, internal hemorrhoids, rectal polyps    Prior to Admission medications   Medication Sig Start Date End Date Taking? Authorizing Provider  albuterol-ipratropium (COMBIVENT) 18-103 MCG/ACT inhaler Inhale 2 puffs into the lungs every 8 (eight) hours as needed.     Yes Historical Provider, MD  fenofibrate 160 MG tablet Take 160 mg by mouth daily.     Yes Historical Provider, MD  glipiZIDE (GLUCOTROL XL) 2.5 MG 24 hr tablet Take 2.5 mg by mouth daily.     Yes Historical Provider, MD  HYDROcodone-acetaminophen (VICODIN) 5-500 MG per tablet Take 1 tablet by mouth 3 (three) times daily as needed. FOR LEG PAIN    Yes Historical Provider, MD  Insulin Glargine (LANTUS SOLOSTAR [place]) Inject 25 Units into the skin at bedtime.    Yes Historical Provider, MD  lisinopril (PRINIVIL,ZESTRIL) 20 MG tablet Take 20 mg by mouth daily.     Yes Historical Provider, MD  metFORMIN (GLUCOPHAGE) 1000  MG tablet Take 1,000 mg by mouth daily with breakfast.     Yes Historical Provider, MD  metoprolol tartrate (LOPRESSOR) 25 MG tablet Take 50 mg by mouth daily.     Yes Historical Provider, MD  omeprazole (PRILOSEC OTC) 20 MG tablet Take 1 tablet (20 mg total) by mouth daily. Take 30 minutes before breakfast daily. 06/20/11 06/19/12 Yes Nira Retort, NP  Pancrelipase, Lip-Prot-Amyl, 24000 UNITS CPEP Take 3 capsules (72,000 Units total) by mouth 3 (three) times daily with meals. Take 2 with snacks. 01/28/12  Yes Joselyn Arrow, NP  spironolactone (ALDACTONE) 25 MG tablet Take 25 mg by mouth daily.     Yes Historical Provider, MD    Allergies as of 03/24/2012  . (No Known Allergies)    Family History  Problem Relation Age of Onset  . Coronary artery disease Mother   . Coronary artery disease Father   . Diabetes Sister   . Suicidality Brother   . Cirrhosis Brother   . Colon cancer Neg Hx     History   Social History  . Marital Status: Married    Spouse Name: N/A    Number of Children: N/A  . Years of Education: N/A   Occupational History  . Not on file.   Social History Main Topics  .  Smoking status: Current Everyday Smoker -- 0.5 packs/day for 62 years    Types: Cigarettes  . Smokeless tobacco: Not on file   Comment: on last pack now then will quit  . Alcohol Use: No     Quit 1985  . Drug Use: No  . Sexually Active: Not on file   Other Topics Concern  . Not on file   Social History Narrative  . No narrative on file    Review of Systems: See HPI, otherwise negative ROS  Physical Exam: BP 112/65  Pulse 97  Temp(Src) 98.1 F (36.7 C) (Temporal)  Ht 5\' 10"  (1.778 m)  Wt 181 lb 6.4 oz (82.283 kg)  BMI 26.03 kg/m2 General:   Alert,  Well-developed, well-nourished, pleasant and cooperative in NAD Skin:  Intact without significant lesions or rashes. Eyes:  Sclera clear, no icterus.   Conjunctiva pink. Ears:  Normal auditory acuity. Nose:  No deformity, discharge,  or  lesions. Mouth:  No deformity or lesions. Neck:  Supple; no masses or thyromegaly. No significant cervical adenopathy. Lungs:  Clear throughout to auscultation.   No wheezes, crackles, or rhonchi. No acute distress. Heart:  Regular rate and rhythm; no murmurs, clicks, rubs,  or gallops. Abdomen: Non-distended, normal bowel sounds.  Soft and nontender without appreciable mass or hepatosplenomegaly.  Pulses:  Normal pulses noted. Extremities:  Without clubbing or edema.

## 2012-04-10 ENCOUNTER — Encounter (HOSPITAL_COMMUNITY): Payer: Self-pay | Admitting: Pharmacy Technician

## 2012-04-15 MED ORDER — SODIUM CHLORIDE 0.45 % IV SOLN
Freq: Once | INTRAVENOUS | Status: AC
  Administered 2012-04-16: 11:00:00 via INTRAVENOUS

## 2012-04-16 ENCOUNTER — Ambulatory Visit (HOSPITAL_COMMUNITY)
Admission: RE | Admit: 2012-04-16 | Discharge: 2012-04-16 | Disposition: A | Discharge status: Home / Self Care | Payer: Medicare Other | Source: Ambulatory Visit | Attending: Internal Medicine | Admitting: Internal Medicine

## 2012-04-16 ENCOUNTER — Encounter (HOSPITAL_COMMUNITY)
Admission: RE | Disposition: A | Discharge status: Home / Self Care | Payer: Self-pay | Source: Ambulatory Visit | Attending: Internal Medicine

## 2012-04-16 ENCOUNTER — Encounter (HOSPITAL_COMMUNITY): Payer: Self-pay | Admitting: *Deleted

## 2012-04-16 DIAGNOSIS — K222 Esophageal obstruction: Secondary | ICD-10-CM | POA: Insufficient documentation

## 2012-04-16 DIAGNOSIS — Z794 Long term (current) use of insulin: Secondary | ICD-10-CM | POA: Insufficient documentation

## 2012-04-16 DIAGNOSIS — Z01812 Encounter for preprocedural laboratory examination: Secondary | ICD-10-CM | POA: Insufficient documentation

## 2012-04-16 DIAGNOSIS — I10 Essential (primary) hypertension: Secondary | ICD-10-CM | POA: Insufficient documentation

## 2012-04-16 DIAGNOSIS — D126 Benign neoplasm of colon, unspecified: Secondary | ICD-10-CM | POA: Insufficient documentation

## 2012-04-16 DIAGNOSIS — I8500 Esophageal varices without bleeding: Secondary | ICD-10-CM

## 2012-04-16 DIAGNOSIS — R6881 Early satiety: Secondary | ICD-10-CM

## 2012-04-16 DIAGNOSIS — E119 Type 2 diabetes mellitus without complications: Secondary | ICD-10-CM | POA: Insufficient documentation

## 2012-04-16 DIAGNOSIS — R1310 Dysphagia, unspecified: Secondary | ICD-10-CM | POA: Insufficient documentation

## 2012-04-16 DIAGNOSIS — Z79899 Other long term (current) drug therapy: Secondary | ICD-10-CM | POA: Insufficient documentation

## 2012-04-16 DIAGNOSIS — Z1211 Encounter for screening for malignant neoplasm of colon: Secondary | ICD-10-CM | POA: Insufficient documentation

## 2012-04-16 DIAGNOSIS — K449 Diaphragmatic hernia without obstruction or gangrene: Secondary | ICD-10-CM | POA: Insufficient documentation

## 2012-04-16 DIAGNOSIS — K5730 Diverticulosis of large intestine without perforation or abscess without bleeding: Secondary | ICD-10-CM

## 2012-04-16 HISTORY — PX: SAVORY DILATION: SHX5439

## 2012-04-16 HISTORY — PX: MALONEY DILATION: SHX5535

## 2012-04-16 LAB — GLUCOSE, CAPILLARY: Glucose-Capillary: 321 mg/dL — ABNORMAL HIGH (ref 70–99)

## 2012-04-16 SURGERY — COLONOSCOPY WITH ESOPHAGOGASTRODUODENOSCOPY (EGD)
Anesthesia: Moderate Sedation

## 2012-04-16 MED ORDER — MIDAZOLAM HCL 5 MG/5ML IJ SOLN
INTRAMUSCULAR | Status: DC | PRN
  Administered 2012-04-16: 1 mg via INTRAVENOUS
  Administered 2012-04-16: 2 mg via INTRAVENOUS
  Administered 2012-04-16: 1 mg via INTRAVENOUS

## 2012-04-16 MED ORDER — INSULIN ASPART 100 UNIT/ML ~~LOC~~ SOLN
10.0000 [IU] | Freq: Once | SUBCUTANEOUS | Status: AC
  Administered 2012-04-16: 10 [IU] via INTRAVENOUS
  Filled 2012-04-16: qty 0.1

## 2012-04-16 MED ORDER — MEPERIDINE HCL 100 MG/ML IJ SOLN
INTRAMUSCULAR | Status: AC
  Filled 2012-04-16: qty 2

## 2012-04-16 MED ORDER — MEPERIDINE HCL 100 MG/ML IJ SOLN
INTRAMUSCULAR | Status: DC | PRN
  Administered 2012-04-16: 50 mg via INTRAVENOUS
  Administered 2012-04-16: 25 mg via INTRAVENOUS

## 2012-04-16 MED ORDER — MIDAZOLAM HCL 5 MG/5ML IJ SOLN
INTRAMUSCULAR | Status: AC
  Filled 2012-04-16: qty 10

## 2012-04-16 MED ORDER — BUTAMBEN-TETRACAINE-BENZOCAINE 2-2-14 % EX AERO
INHALATION_SPRAY | CUTANEOUS | Status: DC | PRN
  Administered 2012-04-16: 2 via TOPICAL

## 2012-04-16 MED ORDER — STERILE WATER FOR IRRIGATION IR SOLN
Status: DC | PRN
  Administered 2012-04-16: 11:00:00

## 2012-04-16 NOTE — Op Note (Signed)
[facility] [address] [place], [place]  [address]  ENDOSCOPY PROCEDURE REPORT  PATIENT:  Stephen [name], Stephen [name]  MR#:  [id number] BIRTHDATE:  [birth year], 71 yrs. old  GENDER:  male  ENDOSCOPIST:  R. Roetta Sessions, MD FACP [facility] Referred by:  Prudy Feeler, PA-C  PROCEDURE DATE:  04/16/2012 PROCEDURE:  EGD with [name] dilation  INDICATIONS:   esophageal dysphagia  INFORMED CONSENT:   The risks, benefits, limitations, alternatives and imponderables have been discussed.  The potential for biopsy, esophogeal dilation, etc. have also been reviewed.  Questions have been answered.  All parties agreeable.  Please see the history and physical in the medical record for more information.  MEDICATIONS:     Versed 3 mg IV and Demerol 50 mg IV in divided doses. Cetacaine spray.  DESCRIPTION OF PROCEDURE:   The EG-2990i ([id number]) endoscope was introduced through the mouth and advanced to the second portion of the duodenum without difficulty or limitations.  The mucosal surfaces were surveyed very carefully during advancement of the scope and upon withdrawal.  Retroflexion view of the proximal stomach and esophagogastric junction was performed.  <<PROCEDUREIMAGES>>  FINDINGS:  Schatzki's ring present. No esophagitis. 3 columns of grade 1 esophageal varices also present. Stomach empty. Small hiatal hernia;              otherwise, gastric mucosa appears normal. Patent pylorus. Normal first and second portion of the duodenum  THERAPEUTIC / DIAGNOSTIC MANEUVERS PERFORMED:  A 56 French Maloney dilator was passed to  full insertion easily. A look back revealed the ring remained intact. Subsequently, a 62 French Maloney dilator was passed with moderate resistance upon full insertion. A look back revealed the ring remained intact. Subsequently, utilizing the biopsy forceps, three quarters bites of the ring were taken to disrupt it;this was done without difficulty or apparent complication.  This maneuver appeared effective. Minimal bleeding.  COMPLICATIONS:   None  IMPRESSION:   Schatzki's ring-status post dilation and disruption as described above. Grade 1 esophageal varices. Small hiatal hernia.  RECOMMENDATIONS:   See colonoscopy report.  ______________________________ R. Roetta Sessions, MD Caleen Essex  CC:  n. eSIGNED:   R. Roetta Sessions at 04/16/2012 11:38 AM  Kathrynn Humble, [phone]

## 2012-04-16 NOTE — Interval H&P Note (Signed)
History and Physical Interval Note:  04/16/2012 10:51 AM Impression: Patient with long-standing GERD now presents with esophageal dysphagia to solids. A Schatzki's ring/stricture or much less likely occult neoplasm remain in the differential. He needs further evaluation. We talked about the pros and cons of the barium contrast study versus going directly to him endoscopic evaluation. Patient wishes to have an endoscopic evaluation. As a separate issue, he is due for average risk colorectal cancer screening. We'll offer him a colonoscopy at this time as well for screening purposes. Stephen [name]  has presented today for surgery, with the diagnosis of screening CRC and dysphagia  The various methods of treatment have been discussed with the patient and family. After consideration of risks, benefits and other options for treatment, the patient has consented to  Procedure(s) (LRB): COLONOSCOPY WITH ESOPHAGOGASTRODUODENOSCOPY (EGD) (N/A) [name] DILATION (N/A) MALONEY DILATION (N/A) as a surgical intervention .  The patients' history has been reviewed, patient examined, no change in status, stable for surgery.  I have reviewed the patients' chart and labs.  Questions were answered to the patient's satisfaction.     Eula Listen

## 2012-04-16 NOTE — Discharge Instructions (Addendum)
Colonoscopy Discharge Instructions  Read the instructions outlined below and refer to this sheet in the next few weeks. These discharge instructions provide you with general information on caring for yourself after you leave the hospital. Your doctor may also give you specific instructions. While your treatment has been planned according to the most current medical practices available, unavoidable complications occasionally occur. If you have any problems or questions after discharge, call Dr. [name] at [phone]. ACTIVITY  You may resume your regular activity, but move at a slower pace for the next 24 hours.   Take frequent rest periods for the next 24 hours.   Walking will help get rid of the air and reduce the bloated feeling in your belly (abdomen).   No driving for 24 hours (because of the medicine (anesthesia) used during the test).    Do not sign any important legal documents or operate any machinery for 24 hours (because of the anesthesia used during the test).  NUTRITION  Drink plenty of fluids.   You may resume your normal diet as instructed by your doctor.   Begin with a light meal and progress to your normal diet. Heavy or fried foods are harder to digest and may make you feel sick to your stomach (nauseated).   Avoid alcoholic beverages for 24 hours or as instructed.  MEDICATIONS  You may resume your normal medications unless your doctor tells you otherwise.  WHAT YOU CAN EXPECT TODAY  Some feelings of bloating in the abdomen.   Passage of more gas than usual.   Spotting of blood in your stool or on the toilet paper.  IF YOU HAD POLYPS REMOVED DURING THE COLONOSCOPY:  No aspirin products for 7 days or as instructed.   No alcohol for 7 days or as instructed.   Eat a soft diet for the next 24 hours.  FINDING OUT THE RESULTS OF YOUR TEST Not all test results are available during your visit. If your test results are not back during the visit, make an appointment  with your caregiver to find out the results. Do not assume everything is normal if you have not heard from your caregiver or the medical facility. It is important for you to follow up on all of your test results.  SEEK IMMEDIATE MEDICAL ATTENTION IF:  You have more than a spotting of blood in your stool.   Your belly is swollen (abdominal distention).   You are nauseated or vomiting.   You have a temperature over 101.   You have abdominal pain or discomfort that is severe or gets worse throughout the day.   EGD Discharge instructions Please read the instructions outlined below and refer to this sheet in the next few weeks. These discharge instructions provide you with general information on caring for yourself after you leave the hospital. Your doctor may also give you specific instructions. While your treatment has been planned according to the most current medical practices available, unavoidable complications occasionally occur. If you have any problems or questions after discharge, please call your doctor. ACTIVITY  You may resume your regular activity but move at a slower pace for the next 24 hours.   Take frequent rest periods for the next 24 hours.   Walking will help expel (get rid of) the air and reduce the bloated feeling in your abdomen.   No driving for 24 hours (because of the anesthesia (medicine) used during the test).   You may shower.   Do not sign any  important legal documents or operate any machinery for 24 hours (because of the anesthesia used during the test).  NUTRITION  Drink plenty of fluids.   You may resume your normal diet.   Begin with a light meal and progress to your normal diet.   Avoid alcoholic beverages for 24 hours or as instructed by your caregiver.  MEDICATIONS  You may resume your normal medications unless your caregiver tells you otherwise.  WHAT YOU CAN EXPECT TODAY  You may experience abdominal discomfort such as a feeling of  fullness or "gas" pains.  FOLLOW-UP  Your doctor will discuss the results of your test with you.  SEEK IMMEDIATE MEDICAL ATTENTION IF ANY OF THE FOLLOWING OCCUR:  Excessive nausea (feeling sick to your stomach) and/or vomiting.   Severe abdominal pain and distention (swelling).   Trouble swallowing.   Temperature over 101 F (37.8 C).   Rectal bleeding or vomiting of blood.     Polyp and diverticulosis information provided  No aspirin or NSAID drugs for the next 10 days  Further recommendations to follow pending review of pathology report.  Colon Polyps A polyp is extra tissue that grows inside your body. Colon polyps grow in the large intestine. The large intestine, also called the colon, is part of your digestive system. It is a long, hollow tube at the end of your digestive tract where your body makes and stores stool. Most polyps are not dangerous. They are benign. This means they are not cancerous. But over time, some types of polyps can turn into cancer. Polyps that are smaller than a pea are usually not harmful. But larger polyps could someday become or may already be cancerous. To be safe, doctors remove all polyps and test them.  WHO GETS POLYPS? Anyone can get polyps, but certain people are more likely than others. You may have a greater chance of getting polyps if:  You are over 50.   You have had polyps before.   Someone in your family has had polyps.   Someone in your family has had cancer of the large intestine.   Find out if someone in your family has had polyps. You may also be more likely to get polyps if you:   Eat a lot of fatty foods.   Smoke.   Drink alcohol.   Do not exercise.   Eat too much.  SYMPTOMS  Most small polyps do not cause symptoms. People often do not know they have one until their caregiver finds it during a regular checkup or while testing them for something else. Some people do have symptoms like these:  Bleeding from the  anus. You might notice blood on your underwear or on toilet paper after you have had a bowel movement.   Constipation or diarrhea that lasts more than a week.   Blood in the stool. Blood can make stool look black or it can show up as red streaks in the stool.  If you have any of these symptoms, see your caregiver. HOW DOES THE DOCTOR TEST FOR POLYPS? The doctor can use four tests to check for polyps:  Digital rectal exam. The caregiver wears gloves and checks your rectum (the last part of the large intestine) to see if it feels normal. This test would find polyps only in the rectum. Your caregiver may need to do one of the other tests listed below to find polyps higher up in the intestine.   Barium enema. The caregiver puts a liquid called  barium into your rectum before taking x-rays of your large intestine. Barium makes your intestine look white in the pictures. Polyps are dark, so they are easy to see.   Sigmoidoscopy. With this test, the caregiver can see inside your large intestine. A thin flexible tube is placed into your rectum. The device is called a sigmoidoscope, which has a light and a tiny video camera in it. The caregiver uses the sigmoidoscope to look at the last third of your large intestine.   Colonoscopy. This test is like sigmoidoscopy, but the caregiver looks at all of the large intestine. It usually requires sedation. This is the most common method for finding and removing polyps.  TREATMENT   The caregiver will remove the polyp during sigmoidoscopy or colonoscopy. The polyp is then tested for cancer.   If you have had polyps, your caregiver may want you to get tested regularly in the future.  PREVENTION  There is not one sure way to prevent polyps. You might be able to lower your risk of getting them if you:  Eat more fruits and vegetables and less fatty food.   Do not smoke.   Avoid alcohol.   Exercise every day.   Lose weight if you are overweight.   Eating  more calcium and folate can also lower your risk of getting polyps. Some foods that are rich in calcium are milk, cheese, and broccoli. Some foods that are rich in folate are chickpeas, kidney beans, and spinach.   Aspirin might help prevent polyps. Studies are under way.   Diverticulosis Diverticulosis is a common condition that develops when small pouches (diverticula) form in the wall of the colon. The risk of diverticulosis increases with age. It happens more often in people who eat a low-fiber diet. Most individuals with diverticulosis have no symptoms. Those individuals with symptoms usually experience abdominal pain, constipation, or loose stools (diarrhea). HOME CARE INSTRUCTIONS   Increase the amount of fiber in your diet as directed by your caregiver or dietician. This may reduce symptoms of diverticulosis.   Your caregiver may recommend taking a dietary fiber supplement.   Drink at least 6 to 8 glasses of water each day to prevent constipation.   Try not to strain when you have a bowel movement.   Your caregiver may recommend avoiding nuts and seeds to prevent complications, although this is still an uncertain benefit.   Only take over-the-counter or prescription medicines for pain, discomfort, or fever as directed by your caregiver.  FOODS WITH HIGH FIBER CONTENT INCLUDE:  Fruits. Apple, peach, pear, tangerine, raisins, prunes.   Vegetables. Brussels sprouts, asparagus, broccoli, cabbage, carrot, cauliflower, romaine lettuce, spinach, summer squash, tomato, winter squash, zucchini.   Starchy Vegetables. Baked beans, kidney beans, lima beans, split peas, lentils, potatoes (with skin).   Grains. Whole wheat bread, brown rice, bran flake cereal, plain oatmeal, white rice, shredded wheat, bran muffins.  SEEK IMMEDIATE MEDICAL CARE IF:   You develop increasing pain or severe bloating.   You have an oral temperature above 102 F (38.9 C), not controlled by medicine.   You  develop vomiting or bowel movements that are bloody or black.  Document Released: 07/18/2004 Document Revised: 10/10/2011 Document Reviewed: 03/21/2010 [facility] Patient Information 2012 [place], [place].

## 2012-04-16 NOTE — Op Note (Signed)
[facility] [address] [place], [place]  [address]  COLONOSCOPY PROCEDURE REPORT  PATIENT:  Stephen, [name]  MR#:  [id number] BIRTHDATE:  [birth year], 71 yrs. old  GENDER:  male ENDOSCOPIST:  R. Roetta Sessions, MD FACP [facility] REF. BY:  Prudy Feeler, PA-C PROCEDURE DATE:  04/16/2012 PROCEDURE:  Colonoscopy with snare polypectomy and biopsy  INDICATIONS:  Colorectal cancer screening  INFORMED CONSENT:  The risks, benefits, alternatives and imponderables including but not limited to bleeding, perforation as well as the possibility of a missed lesion have been reviewed. The potential for biopsy, lesion removal, etc. have also been discussed.  Questions have been answered.  All parties agreeable. Please see the history and physical in the medical record for more information.  MEDICATIONS:  Versed 4 mg IV Demerol 75 mg IV in divided doses (see EGD report)  DESCRIPTION OF PROCEDURE:  After a digital rectal exam was performed, the EG-[id number] ([id number]) and [id number] ([id number]) colonoscope was advanced from the anus through the rectum and colon to the area of the cecum, ileocecal valve and appendiceal orifice.  The cecum was deeply intubated.  These structures were well-seen and photographed for the record.  From the level of the cecum and ileocecal valve, the scope was slowly and cautiously withdrawn.  The mucosal surfaces were carefully surveyed utilizing scope tip deflection to facilitate fold flattening as needed.  The scope was pulled down into the rectum where a thorough examination including retroflexion was performed. <<PROCEDUREIMAGES>>  FINDINGS: marginal prep compromised the examination. Anal papilla and internal hemorrhoids; otherwise normal rectum. Left-sided /transverse diverticula. Somewhat elongated and redundant colon. Quite a bit of ongoing spasm during the procedure. Patient had multiple ascending colon polyps -  the largest being 5 mm by approximately 1.5 cm on  a fold requiring piecemeal polypectomy. We took out over 20 polyps in the ascending  segment. These were all done with  hot snare technique. There was a single diminutive polyp in the base of cecum which was cold biopsy removed. The relatively poor prep on the right side along with exuberant peristalsis made the exam much more difficult. I do believe there were one or more 5 mm polyps that were seen but could not be approached because of the above-mentioned technical difficulties.  THERAPEUTIC / DIAGNOSTIC MANEUVERS PERFORMED: See above  COMPLICATIONS:  None  CECAL WITHDRAWAL TIME: 38 minutes  IMPRESSION: Colonic diverticulosis. Multiple colonic polyps resected as described above  RECOMMENDATIONS: No aspirin or arthritis medications for 10 days. Depending on pathology, patient will require early followup colonoscopy i.e. in 3 months. Consider scheduling extra time and provide him with an extra prep; may utilize glucagon to facilitate followup surveillance of his colon.  ______________________________ R. Roetta Sessions, MD FACP [facility]  CC:  Prudy Feeler, PA-C  n. eSIGNED:   R. Roetta Sessions at 04/16/2012 12:54 PM  Kathrynn Humble, [phone]

## 2012-04-16 NOTE — H&P (View-Only) (Signed)
Primary Care Physician:  JONES, ANGIE L, NT, Nursing/ Tech I Primary Gastroenterologist:  Dr.   Pre-Procedure History & Physical: HPI:  Stephen [name] is a 71 y.o. male here for consideration screening colonoscopy. Continue since he last had one. Hyperplastic polyps and diverticulosis at that time. No bowel symptoms these days. Has history of acute on chronic pancreatitis. Prior EtOH abuser. MRCP previously demonstrated a mildly dilated the bile duct and changes of chronic pancreatitis.  He was seen by our  group in the hospital last July. He was scheduled to have an EUS but never followed up. He also reported dysphagia; he continues to have some esophageal dysphagia to solids and early satiety. Peptic ulcer disease demonstrated at EGD by Dr. Fleishman  some 10 years ago.  Denies weight loss, melena or hematochezia. He denies history of GI malignancy.  Long history of alcohol and tobacco exposure.  Is not really having any reflux symptoms (on omeprazole and pancreatic enzyme supplements).  Past Medical History  Diagnosis Date  . Diabetes mellitus   . Hypertension   . Heart disease   . Pancreatitis   . Neuropathy   . Hyperlipemia   . Peripheral vascular disease   . GERD (gastroesophageal reflux disease)   . NSTEMI (non-ST elevated myocardial infarction)   . S/P colonoscopy July 2003    hyperplastic polyp, diverticulosis, anal papilla and internal hemorrhoids r  . S/P endoscopy 2003    Dr. Fleischmann: erosive reflux esophagitis , PUD  . PUD (peptic ulcer disease)     diagnosed via EGD by Dr. Fleischmann at [facility]  . Diverticulosis   . Hemorrhoids     Past Surgical History  Procedure Date  . 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 Right inguinal hernia repair   . Cholecystectomy     3-4 years ago  . Appendectomy     age 20  . Cataracts   . Colonoscopy 05/12/2002    Dr. Delila Kuklinski- diverticulosis, anal papilla, internal hemorrhoids, rectal polyps    Prior to Admission  medications   Medication Sig Start Date End Date Taking? Authorizing Provider  albuterol-ipratropium (COMBIVENT) 18-103 MCG/ACT inhaler Inhale 2 puffs into the lungs every 8 (eight) hours as needed.     Yes Historical Provider, MD  fenofibrate 160 MG tablet Take 160 mg by mouth daily.     Yes Historical Provider, MD  glipiZIDE (GLUCOTROL XL) 2.5 MG 24 hr tablet Take 2.5 mg by mouth daily.     Yes Historical Provider, MD  HYDROcodone-acetaminophen (VICODIN) 5-500 MG per tablet Take 1 tablet by mouth 3 (three) times daily as needed. FOR LEG PAIN    Yes Historical Provider, MD  Insulin Glargine (LANTUS SOLOSTAR [place]) Inject 25 Units into the skin at bedtime.    Yes Historical Provider, MD  lisinopril (PRINIVIL,ZESTRIL) 20 MG tablet Take 20 mg by mouth daily.     Yes Historical Provider, MD  metFORMIN (GLUCOPHAGE) 1000 MG tablet Take 1,000 mg by mouth daily with breakfast.     Yes Historical Provider, MD  metoprolol tartrate (LOPRESSOR) 25 MG tablet Take 50 mg by mouth daily.     Yes Historical Provider, MD  omeprazole (PRILOSEC OTC) 20 MG tablet Take 1 tablet (20 mg total) by mouth daily. Take 30 minutes before breakfast daily. 06/20/11 06/19/12 Yes Anna W Sams, NP  Pancrelipase, Lip-Prot-Amyl, 24000 UNITS CPEP Take 3 capsules (72,000 Units total) by mouth 3 (three) times daily with meals. Take 2 with snacks. 01/28/12    Yes Kandice L Jones, NP  spironolactone (ALDACTONE) 25 MG tablet Take 25 mg by mouth daily.     Yes Historical Provider, MD    Allergies as of 03/24/2012  . (No Known Allergies)    Family History  Problem Relation Age of Onset  . Coronary artery disease Mother   . Coronary artery disease Father   . Diabetes Sister   . Suicidality Brother   . Cirrhosis Brother   . Colon cancer Neg Hx     History   Social History  . Marital Status: Married    Spouse Name: N/A    Number of Children: N/A  . Years of Education: N/A   Occupational History  . Not on file.   Social History Main  Topics  . Smoking status: Current Everyday Smoker -- 0.5 packs/day for 62 years    Types: Cigarettes  . Smokeless tobacco: Not on file   Comment: on last pack now then will quit  . Alcohol Use: No     Quit 1985  . Drug Use: No  . Sexually Active: Not on file   Other Topics Concern  . Not on file   Social History Narrative  . No narrative on file    Review of Systems: See HPI, otherwise negative ROS  Physical Exam: BP 112/65  Pulse 97  Temp(Src) 98.1 F (36.7 C) (Temporal)  Ht 5' 10" (1.778 m)  Wt 181 lb 6.4 oz (82.283 kg)  BMI 26.03 kg/m2 General:   Alert,  Well-developed, well-nourished, pleasant and cooperative in NAD  edentulous Skin:  Intact without significant lesions or rashes. Eyes:  Sclera clear, no icterus.   Conjunctiva pink. Ears:  Normal auditory acuity. Nose:  No deformity, discharge,  or lesions. Mouth:  No deformity or lesions. Neck:  Supple; no masses or thyromegaly. No significant cervical adenopathy. Lungs:  Clear throughout to auscultation.   No wheezes, crackles, or rhonchi. No acute distress. Heart:  Regular rate and rhythm; no murmurs, clicks, rubs,  or gallops. Abdomen: Non-distended, normal bowel sounds.  Soft and nontender without appreciable mass or hepatosplenomegaly.  Pulses:  Normal pulses noted. Extremities:  Without clubbing or edema.  

## 2012-04-17 ENCOUNTER — Encounter: Payer: Self-pay | Admitting: Internal Medicine

## 2012-04-20 NOTE — Progress Notes (Unsigned)
Letter from: Stephen [name] Reason for Letter: Results Review Send letter to patient.  Send copy of letter with path to referring provider and PCP.  Patient needs repeat colonoscopy in 3 months. Needs an extra 2 L of GoLYTELY plus an extra half a day on clear liquids. Please alert short stay that I want to have glucagon on hand to give this patient as needed to help with the procedure

## 2012-04-20 NOTE — Progress Notes (Signed)
Reminder in epic to repeat tcs in 3 months

## 2012-04-20 NOTE — Progress Notes (Unsigned)
Letter mailed to pt, cc'd pcp. Stephen [name]- please nic appt.  [name]- [place] Childes

## 2012-04-21 ENCOUNTER — Encounter (HOSPITAL_COMMUNITY): Payer: Self-pay | Admitting: Internal Medicine

## 2012-04-23 ENCOUNTER — Encounter: Payer: Self-pay | Admitting: Internal Medicine

## 2012-05-15 ENCOUNTER — Telehealth: Payer: Self-pay

## 2012-05-15 NOTE — Telephone Encounter (Signed)
Pt called- he woke up this morning and after his bowel movement he noticed some bright red blood with his stool and he has some soreness that he feels is coming from his hemorrhoids. Pt denies abd pain, n/v.  Pt is requesting something called in to [facility] in [place] for hemorrhoids. Please advise.

## 2012-05-15 NOTE — Telephone Encounter (Signed)
pts wife is aware. 

## 2012-05-15 NOTE — Telephone Encounter (Signed)
Colonoscopy one month ago multiple polyps. Colonic diverticulosis. No significant hemorrhoids reported. He is having some anorectal pain. Is make sure he is taking fiber i.e. Benefiber 2 tablespoons daily. Add Anusol suppositories one per rectum twice a day x7 days. Obviously, if rectal bleeding does not settle down in the next 24 hours he needs to call in

## 2012-06-22 ENCOUNTER — Other Ambulatory Visit: Payer: Self-pay | Admitting: Internal Medicine

## 2012-07-13 ENCOUNTER — Encounter: Payer: Self-pay | Admitting: Internal Medicine

## 2012-07-14 ENCOUNTER — Encounter: Payer: Self-pay | Admitting: Internal Medicine

## 2012-07-14 ENCOUNTER — Ambulatory Visit (INDEPENDENT_AMBULATORY_CARE_PROVIDER_SITE_OTHER): Payer: Medicare Other | Admitting: Internal Medicine

## 2012-07-14 VITALS — BP 173/83 | HR 85 | Temp 98.1°F | Ht 70.0 in | Wt 179.6 lb

## 2012-07-14 DIAGNOSIS — Z86010 Personal history of colonic polyps: Secondary | ICD-10-CM

## 2012-07-14 MED ORDER — PEG 3350-KCL-NA BICARB-NACL 420 G PO SOLR: 4000.0000 mL | ORAL | Status: AC

## 2012-07-14 NOTE — Patient Instructions (Addendum)
Schedule a colonoscopy to check on polyps  Will need extra 2 liters of prep  Schedule extra time  Glucagon on call in endoscopy  Do not take evening dose of metformin night before procedure

## 2012-07-14 NOTE — Progress Notes (Signed)
Primary Care Physician:  Lajean Saver, NT Primary Gastroenterologist:  Dr.   Pre-Procedure History & Physical: HPI:  Stephen [name] is a 71 y.o. male here for here to set up a early surveillance colonoscopy. 3 months ago the patient underwent a colonoscopy where multiple ascending colon polyps were removed, some in piecemeal fashion. Prep was poor. There is quite a bit of colonic spasm during the procedure. It was recommended that the patient return for followup exam now. All polyps may not have been removed. Patient did have some dark blood per rectum which was self limiting phenomenon about 2 weeks after the procedure.  Past Medical History  Diagnosis Date  . Diabetes mellitus   . Hypertension   . Heart disease   . Pancreatitis   . Neuropathy   . Hyperlipemia   . Peripheral vascular disease   . GERD (gastroesophageal reflux disease)   . NSTEMI (non-ST elevated myocardial infarction)   . S/P colonoscopy July 2003    hyperplastic polyp, diverticulosis, anal papilla and internal hemorrhoids r  . S/P endoscopy 2003    Dr. Tish Men: erosive reflux esophagitis , PUD  . PUD (peptic ulcer disease)     diagnosed via EGD by Dr. Tish Men at [facility]  . Diverticulosis   . Hemorrhoids     Past Surgical History  Procedure Date  . 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 Right inguinal hernia repair   . Cholecystectomy     3-4 years ago  . Appendectomy     age 79  . Cataracts   . Colonoscopy 05/12/2002    Dr. Jena Gauss- diverticulosis, anal papilla, internal hemorrhoids, rectal polyps  . [name] dilation 04/16/2012    Schatzki's ring-status post dilation and disruption/ as described above. Grade 1 esophageal varices. Small hiatal hernia.  [name] dilation 04/16/2012    Procedure: [name] DILATION;  Surgeon: Corbin Ade, MD;  Location: [facility];  Service: Endoscopy;  Laterality: N/A;    Prior to Admission medications   Medication Sig Start Date End Date Taking?  Authorizing Provider  albuterol-ipratropium (COMBIVENT) 18-103 MCG/ACT inhaler Inhale 2 puffs into the lungs every 8 (eight) hours as needed.     Yes Historical Provider, MD  aspirin EC 81 MG tablet Take 81 mg by mouth daily.   Yes Historical Provider, MD  fenofibrate 160 MG tablet Take 160 mg by mouth daily.     Yes Historical Provider, MD  glipiZIDE (GLUCOTROL XL) 2.5 MG 24 hr tablet Take 2.5 mg by mouth daily.     Yes Historical Provider, MD  HYDROcodone-acetaminophen (VICODIN) 5-500 MG per tablet Take 1 tablet by mouth 3 (three) times daily as needed. FOR LEG PAIN    Yes Historical Provider, MD  Insulin Glargine (LANTUS SOLOSTAR [place]) Inject 30 Units into the skin daily.    Yes Historical Provider, MD  lisinopril (PRINIVIL,ZESTRIL) 20 MG tablet Take 20 mg by mouth daily.     Yes Historical Provider, MD  metFORMIN (GLUCOPHAGE) 1000 MG tablet Take 1,000 mg by mouth 2 (two) times daily.    Yes Historical Provider, MD  metoprolol tartrate (LOPRESSOR) 25 MG tablet Take 25 mg by mouth daily.    Yes Historical Provider, MD  nitroGLYCERIN (NITROSTAT) 0.4 MG SL tablet Place 0.4 mg under the tongue every 5 (five) minutes x 3 doses as needed.   Yes Historical Provider, MD  omeprazole (PRILOSEC OTC) 20 MG tablet Take 20 mg by mouth daily. Take 30 minutes before  breakfast daily.   Yes Nira Retort, NP  Pancrelipase, Lip-Prot-Amyl, 24000 UNITS CPEP Take 3 capsules (72,000 Units total) by mouth 3 (three) times daily with meals. Take 2 with snacks. 01/28/12  Yes Joselyn Arrow, NP  spironolactone (ALDACTONE) 25 MG tablet Take 25 mg by mouth daily.     Yes Historical Provider, MD  SYMBICORT 160-4.5 MCG/ACT inhaler Inhale 2 puffs into the lungs 2 (two) times daily.  07/04/12  Yes Historical Provider, MD    Allergies as of 07/14/2012  . (No Known Allergies)    Family History  Problem Relation Age of Onset  . Coronary artery disease Mother   . Coronary artery disease Father   . Diabetes Sister   . Suicidality  Brother   . Cirrhosis Brother   . Colon cancer Neg Hx     History   Social History  . Marital Status: Married    Spouse Name: N/A    Number of Children: N/A  . Years of Education: N/A   Occupational History  . Not on file.   Social History Main Topics  . Smoking status: Current Everyday Smoker -- 0.5 packs/day for 62 years    Types: Cigarettes  . Smokeless tobacco: Not on file   Comment: on last pack now then will quit  . Alcohol Use: No     Quit 1985  . Drug Use: No  . Sexually Active: Not on file   Other Topics Concern  . Not on file   Social History Narrative  . No narrative on file    Review of Systems: See HPI, otherwise negative ROS  Physical Exam: BP 173/83  Pulse 85  Temp 98.1 F (36.7 C) (Temporal)  Ht 5\' 10"  (1.778 m)  Wt 179 lb 9.6 oz (81.466 kg)  BMI 25.77 kg/m2 General:  Frail somewhat elderly gentleman pleasant and cooperative in NAD Skin:  Intact without significant lesions or rashes. Eyes:  Sclera clear, no icterus.   Conjunctiva pink. Ears:  Normal auditory acuity. Nose:  No deformity, discharge,  or lesions. Mouth:  No deformity or lesions. Neck:  Supple; no masses or thyromegaly. No significant cervical adenopathy. Lungs:  Clear throughout to auscultation.   No wheezes, crackles, or rhonchi. No acute distress. Heart:  Regular rate and rhythm; no murmurs, clicks, rubs,  or gallops. Abdomen: Non-distended, normal bowel sounds.  Soft and nontender without appreciable mass or hepatosplenomegaly.  Pulses:  Normal pulses noted. Extremities:  Without clubbing or edema.  Impression/Plan:  Multiple ascending colon adenomas, some removed in piecemeal fashion 3 months ago. He needs early followup exam now to make sure all polyp tissue was removed. The reasoning  behind this approach has been explained to the patient.The risks, benefits, limitations, alternatives and imponderables have been reviewed with the patient. Questions have been answered. All  parties are agreeable. Will give him an extended prep. Will schedule additional 30 minutes for the procedure. I will plan to give him glucagon because of spasm in his colon previously.  We'll hold the nighttime dose of metformin prior to the procedure.

## 2012-08-03 ENCOUNTER — Other Ambulatory Visit: Payer: Self-pay

## 2012-08-04 MED ORDER — PANCRELIPASE (LIP-PROT-AMYL) 24000-76000 UNITS PO CPEP: 3.0000 | ORAL_CAPSULE | Freq: Three times a day (TID) | ORAL | Status: DC

## 2012-08-05 ENCOUNTER — Encounter (HOSPITAL_COMMUNITY): Payer: Self-pay | Admitting: Pharmacy Technician

## 2012-08-06 ENCOUNTER — Telehealth: Payer: Self-pay | Admitting: Cardiology

## 2012-08-06 NOTE — Telephone Encounter (Signed)
Requested that pharmacy send refill request to PCP due to patient not being seen in office in over 3 years.

## 2012-08-10 ENCOUNTER — Encounter (HOSPITAL_COMMUNITY): Payer: Self-pay | Admitting: *Deleted

## 2012-08-10 ENCOUNTER — Ambulatory Visit (HOSPITAL_COMMUNITY)
Admission: RE | Admit: 2012-08-10 | Discharge: 2012-08-10 | Disposition: A | Discharge status: Home / Self Care | Payer: Medicare Other | Source: Ambulatory Visit | Attending: Internal Medicine | Admitting: Internal Medicine

## 2012-08-10 ENCOUNTER — Encounter (HOSPITAL_COMMUNITY)
Admission: RE | Disposition: A | Discharge status: Home / Self Care | Payer: Self-pay | Source: Ambulatory Visit | Attending: Internal Medicine

## 2012-08-10 DIAGNOSIS — Z79899 Other long term (current) drug therapy: Secondary | ICD-10-CM | POA: Insufficient documentation

## 2012-08-10 DIAGNOSIS — D126 Benign neoplasm of colon, unspecified: Secondary | ICD-10-CM | POA: Insufficient documentation

## 2012-08-10 DIAGNOSIS — I10 Essential (primary) hypertension: Secondary | ICD-10-CM | POA: Insufficient documentation

## 2012-08-10 DIAGNOSIS — Z01812 Encounter for preprocedural laboratory examination: Secondary | ICD-10-CM | POA: Insufficient documentation

## 2012-08-10 DIAGNOSIS — K648 Other hemorrhoids: Secondary | ICD-10-CM | POA: Insufficient documentation

## 2012-08-10 DIAGNOSIS — Z860100 Personal history of colon polyps, unspecified: Secondary | ICD-10-CM | POA: Insufficient documentation

## 2012-08-10 DIAGNOSIS — K5730 Diverticulosis of large intestine without perforation or abscess without bleeding: Secondary | ICD-10-CM | POA: Insufficient documentation

## 2012-08-10 DIAGNOSIS — E119 Type 2 diabetes mellitus without complications: Secondary | ICD-10-CM | POA: Insufficient documentation

## 2012-08-10 DIAGNOSIS — Z794 Long term (current) use of insulin: Secondary | ICD-10-CM | POA: Insufficient documentation

## 2012-08-10 DIAGNOSIS — E785 Hyperlipidemia, unspecified: Secondary | ICD-10-CM | POA: Insufficient documentation

## 2012-08-10 HISTORY — PX: COLONOSCOPY: SHX5424

## 2012-08-10 LAB — GLUCOSE, CAPILLARY: Glucose-Capillary: 244 mg/dL — ABNORMAL HIGH (ref 70–99)

## 2012-08-10 SURGERY — COLONOSCOPY
Anesthesia: Moderate Sedation

## 2012-08-10 MED ORDER — GLUCAGON HCL (RDNA) 1 MG IJ SOLR
INTRAMUSCULAR | Status: DC | PRN
  Administered 2012-08-10 (×2): .5 mg via INTRAVENOUS

## 2012-08-10 MED ORDER — STERILE WATER FOR IRRIGATION IR SOLN
Status: DC | PRN
  Administered 2012-08-10: 10:00:00

## 2012-08-10 MED ORDER — MIDAZOLAM HCL 5 MG/5ML IJ SOLN
INTRAMUSCULAR | Status: DC | PRN
  Administered 2012-08-10: 1 mg via INTRAVENOUS
  Administered 2012-08-10 (×2): 2 mg via INTRAVENOUS

## 2012-08-10 MED ORDER — MIDAZOLAM HCL 5 MG/5ML IJ SOLN
INTRAMUSCULAR | Status: AC
  Filled 2012-08-10: qty 10

## 2012-08-10 MED ORDER — MEPERIDINE HCL 100 MG/ML IJ SOLN
INTRAMUSCULAR | Status: DC | PRN
  Administered 2012-08-10: 25 mg via INTRAVENOUS
  Administered 2012-08-10: 50 mg via INTRAVENOUS

## 2012-08-10 MED ORDER — MEPERIDINE HCL 100 MG/ML IJ SOLN
INTRAMUSCULAR | Status: AC
  Filled 2012-08-10: qty 1

## 2012-08-10 MED ORDER — INSULIN REGULAR HUMAN 100 UNIT/ML IJ SOLN
8.0000 [IU] | Freq: Once | INTRAMUSCULAR | Status: AC
  Administered 2012-08-10: 8 [IU] via SUBCUTANEOUS

## 2012-08-10 MED ORDER — SODIUM CHLORIDE 0.45 % IV SOLN
INTRAVENOUS | Status: DC
  Administered 2012-08-10: 1000 mL via INTRAVENOUS

## 2012-08-10 NOTE — H&P (View-Only) (Signed)
Primary Care Physician:  JONES, ANGIE L, NT Primary Gastroenterologist:  Dr.   Pre-Procedure History & Physical: HPI:  Stephen [name] is a 71 y.o. male here for here to set up a early surveillance colonoscopy. 3 months ago the patient underwent a colonoscopy where multiple ascending colon polyps were removed, some in piecemeal fashion. Prep was poor. There is quite a bit of colonic spasm during the procedure. It was recommended that the patient return for followup exam now. All polyps may not have been removed. Patient did have some dark blood per rectum which was self limiting phenomenon about 2 weeks after the procedure.  Past Medical History  Diagnosis Date  . Diabetes mellitus   . Hypertension   . Heart disease   . Pancreatitis   . Neuropathy   . Hyperlipemia   . Peripheral vascular disease   . GERD (gastroesophageal reflux disease)   . NSTEMI (non-ST elevated myocardial infarction)   . S/P colonoscopy July 2003    hyperplastic polyp, diverticulosis, anal papilla and internal hemorrhoids r  . S/P endoscopy 2003    Dr. Fleischmann: erosive reflux esophagitis , PUD  . PUD (peptic ulcer disease)     diagnosed via EGD by Dr. Fleischmann at [facility]  . Diverticulosis   . Hemorrhoids     Past Surgical History  Procedure Date  . 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 Right inguinal hernia repair   . Cholecystectomy     3-4 years ago  . Appendectomy     age 20  . Cataracts   . Colonoscopy 05/12/2002    Dr. Dodd Schmid- diverticulosis, anal papilla, internal hemorrhoids, rectal polyps  . [name] dilation 04/16/2012    Schatzki's ring-status post dilation and disruption/ as described above. Grade 1 esophageal varices. Small hiatal hernia.  . Maloney dilation 04/16/2012    Procedure: MALONEY DILATION;  Surgeon: Avana Kreiser M Talmage Teaster, MD;  Location: [facility];  Service: Endoscopy;  Laterality: N/A;    Prior to Admission medications   Medication Sig Start Date End Date Taking?  Authorizing Provider  albuterol-ipratropium (COMBIVENT) 18-103 MCG/ACT inhaler Inhale 2 puffs into the lungs every 8 (eight) hours as needed.     Yes Historical Provider, MD  aspirin EC 81 MG tablet Take 81 mg by mouth daily.   Yes Historical Provider, MD  fenofibrate 160 MG tablet Take 160 mg by mouth daily.     Yes Historical Provider, MD  glipiZIDE (GLUCOTROL XL) 2.5 MG 24 hr tablet Take 2.5 mg by mouth daily.     Yes Historical Provider, MD  HYDROcodone-acetaminophen (VICODIN) 5-500 MG per tablet Take 1 tablet by mouth 3 (three) times daily as needed. FOR LEG PAIN    Yes Historical Provider, MD  Insulin Glargine (LANTUS SOLOSTAR [place]) Inject 30 Units into the skin daily.    Yes Historical Provider, MD  lisinopril (PRINIVIL,ZESTRIL) 20 MG tablet Take 20 mg by mouth daily.     Yes Historical Provider, MD  metFORMIN (GLUCOPHAGE) 1000 MG tablet Take 1,000 mg by mouth 2 (two) times daily.    Yes Historical Provider, MD  metoprolol tartrate (LOPRESSOR) 25 MG tablet Take 25 mg by mouth daily.    Yes Historical Provider, MD  nitroGLYCERIN (NITROSTAT) 0.4 MG SL tablet Place 0.4 mg under the tongue every 5 (five) minutes x 3 doses as needed.   Yes Historical Provider, MD  omeprazole (PRILOSEC OTC) 20 MG tablet Take 20 mg by mouth daily. Take 30 minutes before   breakfast daily.   Yes Anna W Sams, NP  Pancrelipase, Lip-Prot-Amyl, 24000 UNITS CPEP Take 3 capsules (72,000 Units total) by mouth 3 (three) times daily with meals. Take 2 with snacks. 01/28/12  Yes Kandice L Jones, NP  spironolactone (ALDACTONE) 25 MG tablet Take 25 mg by mouth daily.     Yes Historical Provider, MD  SYMBICORT 160-4.5 MCG/ACT inhaler Inhale 2 puffs into the lungs 2 (two) times daily.  07/04/12  Yes Historical Provider, MD    Allergies as of 07/14/2012  . (No Known Allergies)    Family History  Problem Relation Age of Onset  . Coronary artery disease Mother   . Coronary artery disease Father   . Diabetes Sister   . Suicidality  Brother   . Cirrhosis Brother   . Colon cancer Neg Hx     History   Social History  . Marital Status: Married    Spouse Name: N/A    Number of Children: N/A  . Years of Education: N/A   Occupational History  . Not on file.   Social History Main Topics  . Smoking status: Current Everyday Smoker -- 0.5 packs/day for 62 years    Types: Cigarettes  . Smokeless tobacco: Not on file   Comment: on last pack now then will quit  . Alcohol Use: No     Quit 1985  . Drug Use: No  . Sexually Active: Not on file   Other Topics Concern  . Not on file   Social History Narrative  . No narrative on file    Review of Systems: See HPI, otherwise negative ROS  Physical Exam: BP 173/83  Pulse 85  Temp 98.1 F (36.7 C) (Temporal)  Ht 5' 10" (1.778 m)  Wt 179 lb 9.6 oz (81.466 kg)  BMI 25.77 kg/m2 General:  Frail somewhat elderly gentleman pleasant and cooperative in NAD Skin:  Intact without significant lesions or rashes. Eyes:  Sclera clear, no icterus.   Conjunctiva pink. Ears:  Normal auditory acuity. Nose:  No deformity, discharge,  or lesions. Mouth:  No deformity or lesions. Neck:  Supple; no masses or thyromegaly. No significant cervical adenopathy. Lungs:  Clear throughout to auscultation.   No wheezes, crackles, or rhonchi. No acute distress. Heart:  Regular rate and rhythm; no murmurs, clicks, rubs,  or gallops. Abdomen: Non-distended, normal bowel sounds.  Soft and nontender without appreciable mass or hepatosplenomegaly.  Pulses:  Normal pulses noted. Extremities:  Without clubbing or edema.  Impression/Plan:  Multiple ascending colon adenomas, some removed in piecemeal fashion 3 months ago. He needs early followup exam now to make sure all polyp tissue was removed. The reasoning  behind this approach has been explained to the patient.The risks, benefits, limitations, alternatives and imponderables have been reviewed with the patient. Questions have been answered. All  parties are agreeable. Will give him an extended prep. Will schedule additional 30 minutes for the procedure. I will plan to give him glucagon because of spasm in his colon previously.  We'll hold the nighttime dose of metformin prior to the procedure. 

## 2012-08-10 NOTE — Interval H&P Note (Signed)
History and Physical Interval Note:  08/10/2012 9:34 AM  Alex Gardener  has presented today for surgery, with the diagnosis of history of colon polyps  The various methods of treatment have been discussed with the patient and family. After consideration of risks, benefits and other options for treatment, the patient has consented to  Procedure(s) (LRB) with comments: COLONOSCOPY (N/A) - 9:45 needs 30 mins extra /have Glucagon on hand as a surgical intervention .  The patient's history has been reviewed, patient examined, no change in status, stable for surgery.  I have reviewed the patient's chart and labs.  Questions were answered to the patient's satisfaction.     Eula Listen

## 2012-08-10 NOTE — Op Note (Signed)
[facility] [address] [place], [address]   COLONOSCOPY PROCEDURE REPORT  PATIENT: Stephen [name], Stephen [name]  MR#:         [id number] BIRTHDATE: Sep 05, 1941 , 71  yrs. old GENDER: Male ENDOSCOPIST: R.  Roetta Sessions, MD [facility] REFERRED BY:  Prudy Feeler, PA-C PROCEDURE DATE:  08/10/2012 PROCEDURE:     colonoscopy with multiple snare polypectomies  INDICATIONS: history of colonic adenoma in the setting of a poor prep earlier in year.Not all polyps removed.  INFORMED CONSENT:  The risks, benefits, alternatives and imponderables including but not limited to bleeding, perforation as well as the possibility of a missed lesion have been reviewed.  The potential for biopsy, lesion removal, etc. have also been discussed.  Questions have been answered.  All parties agreeable. Please see the history and physical in the medical record for more information.  MEDICATIONS: Versed 5 mg IV and femoral 75 mg IV in divided doses. Glucagon 1 mg IV in divided doses.  DESCRIPTION OF PROCEDURE:  After a digital rectal exam was performed, the EC-[id number] ([id number])  colonoscope was advanced from the anus through the rectum and colon to the area of the cecum, ileocecal valve and appendiceal orifice.  The cecum was deeply intubated.  These structures were well-seen and photographed for the record.  From the level of the cecum and ileocecal valve, the scope was slowly and cautiously withdrawn.  The mucosal surfaces were carefully surveyed utilizing scope tip deflection to facilitate fold flattening as needed.  The scope was pulled down into the rectum where a thorough examination including retroflexion was performed.    FINDINGS:  Suboptimal preparation but much better than seen previously. Internal hemorrhoids and anal papilla; otherwise, normal rectum. Scattered pancolonic diverticula. (1) 4 x 8 mm sessile polyp in the base the cecum. Patient had similar size polyp in the mid ascending  segment along with 2 other 6 mm polyps in the same segment. The remainder of colonic mucosa appeared normal  THERAPEUTIC / DIAGNOSTIC MANEUVERS PERFORMED:  multiple hot snare polypectomies were performed removing all polyps seen in the cecum and ascending segment.  COMPLICATIONS: none  CECAL WITHDRAWAL TIME:  27 minutes  IMPRESSION:  colonic diverticulosis. Multiple colonic polyps-removed as described above  RECOMMENDATIONS: Followup on pathology.   _______________________________ eSigned:  R. Roetta Sessions, MD FACP [facility] 08/10/2012 10:31 AM   CC:    PATIENT NAME:  Stephen [name], Stephen [name] MR#: [id number]

## 2012-08-12 ENCOUNTER — Encounter: Payer: Self-pay | Admitting: Internal Medicine

## 2012-08-13 ENCOUNTER — Encounter: Payer: Self-pay | Admitting: *Deleted

## 2012-08-17 ENCOUNTER — Encounter (HOSPITAL_COMMUNITY): Payer: Self-pay | Admitting: Internal Medicine

## 2012-08-26 ENCOUNTER — Telehealth: Payer: Self-pay | Admitting: Internal Medicine

## 2012-08-26 ENCOUNTER — Telehealth: Payer: Self-pay

## 2012-08-26 NOTE — Telephone Encounter (Signed)
I do not recommend Epsom salts for constipation. I do recommend MiraLax 17 g orally twice daily until good results-then been back off to once daily as needed

## 2012-08-26 NOTE — Telephone Encounter (Signed)
Open in error

## 2012-08-26 NOTE — Telephone Encounter (Signed)
Pt called- he has not had a BM since Sunday (08-23-12) pt wants to know if he can take epsom salts when he gets constipated like this. He is not having any pain, no N/V, no fever. He has not had any problems with BM's until this week. Pt has not tried any other type of stool softeners or laxatives.  Pt stated he had epsom salts and would like to use those if its ok. Please advise.

## 2012-08-26 NOTE — Telephone Encounter (Signed)
Tried to call pt- LMOM 

## 2012-08-26 NOTE — Telephone Encounter (Signed)
Spoke with pt- he stated he had a BM about an hour after he called this morning. I advised him to get some miralax anyway. He stated he would.

## 2013-03-22 ENCOUNTER — Other Ambulatory Visit (HOSPITAL_COMMUNITY): Payer: Self-pay | Admitting: Nephrology

## 2013-03-22 DIAGNOSIS — N289 Disorder of kidney and ureter, unspecified: Secondary | ICD-10-CM

## 2013-04-15 ENCOUNTER — Ambulatory Visit (HOSPITAL_COMMUNITY)
Admission: RE | Admit: 2013-04-15 | Discharge: 2013-04-15 | Disposition: A | Discharge status: Home / Self Care | Payer: Medicare Other | Source: Ambulatory Visit | Attending: Nephrology | Admitting: Nephrology

## 2013-04-15 DIAGNOSIS — N400 Enlarged prostate without lower urinary tract symptoms: Secondary | ICD-10-CM | POA: Insufficient documentation

## 2013-04-15 DIAGNOSIS — N289 Disorder of kidney and ureter, unspecified: Secondary | ICD-10-CM

## 2013-08-05 ENCOUNTER — Other Ambulatory Visit: Payer: Self-pay

## 2013-08-06 MED ORDER — PANCRELIPASE (LIP-PROT-AMYL) 24000-76000 UNITS PO CPEP: 3.0000 | ORAL_CAPSULE | Freq: Three times a day (TID) | ORAL | Status: DC

## 2013-10-27 ENCOUNTER — Inpatient Hospital Stay (HOSPITAL_COMMUNITY)
Admission: AD | Admit: 2013-10-27 | Discharge: 2013-11-02 | DRG: 190 | Disposition: A | Discharge status: Home / Self Care | Payer: Medicare Other | Source: Other Acute Inpatient Hospital | Attending: Internal Medicine | Admitting: Internal Medicine

## 2013-10-27 ENCOUNTER — Inpatient Hospital Stay (HOSPITAL_COMMUNITY): Payer: Medicare Other

## 2013-10-27 DIAGNOSIS — IMO0002 Reserved for concepts with insufficient information to code with codable children: Secondary | ICD-10-CM | POA: Diagnosis present

## 2013-10-27 DIAGNOSIS — I10 Essential (primary) hypertension: Secondary | ICD-10-CM | POA: Diagnosis present

## 2013-10-27 DIAGNOSIS — K2100 Gastro-esophageal reflux disease with esophagitis, without bleeding: Secondary | ICD-10-CM | POA: Diagnosis present

## 2013-10-27 DIAGNOSIS — I259 Chronic ischemic heart disease, unspecified: Secondary | ICD-10-CM

## 2013-10-27 DIAGNOSIS — D709 Neutropenia, unspecified: Secondary | ICD-10-CM | POA: Diagnosis present

## 2013-10-27 DIAGNOSIS — Z8249 Family history of ischemic heart disease and other diseases of the circulatory system: Secondary | ICD-10-CM

## 2013-10-27 DIAGNOSIS — E138 Other specified diabetes mellitus with unspecified complications: Secondary | ICD-10-CM

## 2013-10-27 DIAGNOSIS — I714 Abdominal aortic aneurysm, without rupture: Secondary | ICD-10-CM

## 2013-10-27 DIAGNOSIS — I509 Heart failure, unspecified: Secondary | ICD-10-CM | POA: Diagnosis present

## 2013-10-27 DIAGNOSIS — Z9889 Other specified postprocedural states: Secondary | ICD-10-CM

## 2013-10-27 DIAGNOSIS — I70219 Atherosclerosis of native arteries of extremities with intermittent claudication, unspecified extremity: Secondary | ICD-10-CM

## 2013-10-27 DIAGNOSIS — E785 Hyperlipidemia, unspecified: Secondary | ICD-10-CM | POA: Diagnosis present

## 2013-10-27 DIAGNOSIS — I739 Peripheral vascular disease, unspecified: Secondary | ICD-10-CM | POA: Diagnosis present

## 2013-10-27 DIAGNOSIS — I718 Aortic aneurysm of unspecified site, ruptured: Secondary | ICD-10-CM

## 2013-10-27 DIAGNOSIS — Z8711 Personal history of peptic ulcer disease: Secondary | ICD-10-CM

## 2013-10-27 DIAGNOSIS — G589 Mononeuropathy, unspecified: Secondary | ICD-10-CM | POA: Diagnosis present

## 2013-10-27 DIAGNOSIS — Z951 Presence of aortocoronary bypass graft: Secondary | ICD-10-CM

## 2013-10-27 DIAGNOSIS — J189 Pneumonia, unspecified organism: Secondary | ICD-10-CM | POA: Diagnosis present

## 2013-10-27 DIAGNOSIS — J449 Chronic obstructive pulmonary disease, unspecified: Secondary | ICD-10-CM | POA: Diagnosis present

## 2013-10-27 DIAGNOSIS — Z7982 Long term (current) use of aspirin: Secondary | ICD-10-CM

## 2013-10-27 DIAGNOSIS — I214 Non-ST elevation (NSTEMI) myocardial infarction: Secondary | ICD-10-CM | POA: Diagnosis present

## 2013-10-27 DIAGNOSIS — E871 Hypo-osmolality and hyponatremia: Secondary | ICD-10-CM | POA: Diagnosis present

## 2013-10-27 DIAGNOSIS — I5022 Chronic systolic (congestive) heart failure: Secondary | ICD-10-CM | POA: Diagnosis present

## 2013-10-27 DIAGNOSIS — I2510 Atherosclerotic heart disease of native coronary artery without angina pectoris: Secondary | ICD-10-CM | POA: Diagnosis present

## 2013-10-27 DIAGNOSIS — Z794 Long term (current) use of insulin: Secondary | ICD-10-CM

## 2013-10-27 DIAGNOSIS — I252 Old myocardial infarction: Secondary | ICD-10-CM

## 2013-10-27 DIAGNOSIS — I2589 Other forms of chronic ischemic heart disease: Secondary | ICD-10-CM | POA: Diagnosis present

## 2013-10-27 DIAGNOSIS — I5042 Chronic combined systolic (congestive) and diastolic (congestive) heart failure: Secondary | ICD-10-CM | POA: Diagnosis present

## 2013-10-27 DIAGNOSIS — E1165 Type 2 diabetes mellitus with hyperglycemia: Secondary | ICD-10-CM | POA: Diagnosis present

## 2013-10-27 DIAGNOSIS — J441 Chronic obstructive pulmonary disease with (acute) exacerbation: Principal | ICD-10-CM | POA: Diagnosis present

## 2013-10-27 DIAGNOSIS — F17200 Nicotine dependence, unspecified, uncomplicated: Secondary | ICD-10-CM | POA: Diagnosis present

## 2013-10-27 HISTORY — DX: Ischemic cardiomyopathy: I25.5

## 2013-10-27 HISTORY — DX: Atherosclerotic heart disease of native coronary artery without angina pectoris: I25.10

## 2013-10-27 HISTORY — DX: Alcohol dependence, uncomplicated: F10.20

## 2013-10-27 HISTORY — DX: Unspecified osteoarthritis, unspecified site: M19.90

## 2013-10-27 HISTORY — DX: Chronic obstructive pulmonary disease, unspecified: J44.9

## 2013-10-27 HISTORY — DX: Sternal manubrial dissociation, initial encounter for closed fracture: S22.23XA

## 2013-10-27 HISTORY — DX: Cleft palate, unspecified: Q35.9

## 2013-10-27 HISTORY — DX: Neutropenia, unspecified: D70.9

## 2013-10-27 LAB — GLUCOSE, CAPILLARY: Glucose-Capillary: 367 mg/dL — ABNORMAL HIGH (ref 70–99)

## 2013-10-27 LAB — HEPARIN LEVEL (UNFRACTIONATED): Heparin Unfractionated: 0.34 IU/mL (ref 0.30–0.70)

## 2013-10-27 LAB — HEMOGLOBIN A1C
Hgb A1c MFr Bld: 10.9 % — ABNORMAL HIGH (ref <5.7)
Mean Plasma Glucose: 266 mg/dL — ABNORMAL HIGH (ref <117)

## 2013-10-27 MED ORDER — ZOLPIDEM TARTRATE 5 MG PO TABS: 5.0000 mg | ORAL_TABLET | Freq: Every evening | ORAL | Status: DC | PRN

## 2013-10-27 MED ORDER — PANTOPRAZOLE SODIUM 40 MG PO TBEC
40.0000 mg | DELAYED_RELEASE_TABLET | Freq: Every day | ORAL | Status: DC
  Administered 2013-10-28 – 2013-11-02 (×6): 40 mg via ORAL
  Filled 2013-10-27 (×5): qty 1

## 2013-10-27 MED ORDER — FENOFIBRATE 160 MG PO TABS
160.0000 mg | ORAL_TABLET | Freq: Every day | ORAL | Status: DC
  Administered 2013-10-27 – 2013-11-02 (×7): 160 mg via ORAL
  Filled 2013-10-27 (×7): qty 1

## 2013-10-27 MED ORDER — SPIRONOLACTONE 25 MG PO TABS
25.0000 mg | ORAL_TABLET | Freq: Every day | ORAL | Status: DC
  Administered 2013-10-27 – 2013-11-02 (×7): 25 mg via ORAL
  Filled 2013-10-27 (×7): qty 1

## 2013-10-27 MED ORDER — ALPRAZOLAM 0.25 MG PO TABS: 0.2500 mg | ORAL_TABLET | Freq: Two times a day (BID) | ORAL | Status: DC | PRN

## 2013-10-27 MED ORDER — SODIUM CHLORIDE 0.9 % IV SOLN: 250.0000 mL | INTRAVENOUS | Status: DC | PRN

## 2013-10-27 MED ORDER — SODIUM CHLORIDE 0.9 % IJ SOLN: 3.0000 mL | INTRAMUSCULAR | Status: DC | PRN

## 2013-10-27 MED ORDER — NITROGLYCERIN 0.4 MG SL SUBL: 0.4000 mg | SUBLINGUAL_TABLET | SUBLINGUAL | Status: DC | PRN

## 2013-10-27 MED ORDER — PANCRELIPASE (LIP-PROT-AMYL) 12000-38000 UNITS PO CPEP
2.0000 | ORAL_CAPSULE | Freq: Three times a day (TID) | ORAL | Status: DC
  Administered 2013-10-28 – 2013-10-30 (×7): 3 via ORAL
  Administered 2013-10-30: 2 via ORAL
  Administered 2013-10-31 (×3): 3 via ORAL
  Administered 2013-11-01: 2 via ORAL
  Administered 2013-11-01 – 2013-11-02 (×4): 3 via ORAL
  Filled 2013-10-27 (×19): qty 3

## 2013-10-27 MED ORDER — SODIUM CHLORIDE 0.9 % IV SOLN
1.0000 mL/kg/h | INTRAVENOUS | Status: DC
  Administered 2013-10-29: 1 mL/kg/h via INTRAVENOUS

## 2013-10-27 MED ORDER — ONDANSETRON HCL 4 MG/2ML IJ SOLN: 4.0000 mg | Freq: Four times a day (QID) | INTRAMUSCULAR | Status: DC | PRN

## 2013-10-27 MED ORDER — SODIUM CHLORIDE 0.9 % IJ SOLN
3.0000 mL | Freq: Two times a day (BID) | INTRAMUSCULAR | Status: DC
  Administered 2013-10-29 – 2013-10-30 (×3): 3 mL via INTRAVENOUS

## 2013-10-27 MED ORDER — GLIPIZIDE ER 2.5 MG PO TB24
2.5000 mg | ORAL_TABLET | Freq: Two times a day (BID) | ORAL | Status: DC
  Administered 2013-10-27 – 2013-11-02 (×9): 2.5 mg via ORAL
  Filled 2013-10-27 (×14): qty 1

## 2013-10-27 MED ORDER — SODIUM CHLORIDE 0.9 % IV SOLN: 1.0000 mL/kg/h | INTRAVENOUS | Status: DC

## 2013-10-27 MED ORDER — ASPIRIN EC 325 MG PO TBEC
325.0000 mg | DELAYED_RELEASE_TABLET | Freq: Every day | ORAL | Status: DC
  Administered 2013-10-28: 325 mg via ORAL
  Filled 2013-10-27: qty 1

## 2013-10-27 MED ORDER — HYDROCODONE-ACETAMINOPHEN 5-325 MG PO TABS
1.0000 | ORAL_TABLET | ORAL | Status: DC | PRN
  Administered 2013-10-28 – 2013-10-30 (×3): 1 via ORAL
  Filled 2013-10-27: qty 1
  Filled 2013-10-27: qty 2
  Filled 2013-10-27: qty 1

## 2013-10-27 MED ORDER — ASPIRIN EC 81 MG PO TBEC
81.0000 mg | DELAYED_RELEASE_TABLET | Freq: Every day | ORAL | Status: DC
  Filled 2013-10-27: qty 1

## 2013-10-27 MED ORDER — ASPIRIN 81 MG PO CHEW
81.0000 mg | CHEWABLE_TABLET | ORAL | Status: AC
  Administered 2013-10-29: 81 mg via ORAL
  Filled 2013-10-27: qty 1

## 2013-10-27 MED ORDER — INSULIN ASPART 100 UNIT/ML ~~LOC~~ SOLN
0.0000 [IU] | Freq: Three times a day (TID) | SUBCUTANEOUS | Status: DC
  Administered 2013-10-28: 3 [IU] via SUBCUTANEOUS
  Administered 2013-10-28: 5 [IU] via SUBCUTANEOUS
  Administered 2013-10-29: 3 [IU] via SUBCUTANEOUS
  Administered 2013-10-30: 8 [IU] via SUBCUTANEOUS
  Administered 2013-10-30: 11 [IU] via SUBCUTANEOUS

## 2013-10-27 MED ORDER — SODIUM CHLORIDE 0.9 % IJ SOLN: 3.0000 mL | Freq: Two times a day (BID) | INTRAMUSCULAR | Status: DC

## 2013-10-27 MED ORDER — ACETAMINOPHEN 325 MG PO TABS: 650.0000 mg | ORAL_TABLET | ORAL | Status: DC | PRN

## 2013-10-27 MED ORDER — METOPROLOL TARTRATE 25 MG PO TABS
25.0000 mg | ORAL_TABLET | Freq: Two times a day (BID) | ORAL | Status: DC
  Filled 2013-10-27: qty 1

## 2013-10-27 MED ORDER — BUDESONIDE-FORMOTEROL FUMARATE 160-4.5 MCG/ACT IN AERO
2.0000 | INHALATION_SPRAY | Freq: Two times a day (BID) | RESPIRATORY_TRACT | Status: DC
  Administered 2013-10-27 – 2013-10-29 (×4): 2 via RESPIRATORY_TRACT
  Filled 2013-10-27: qty 6

## 2013-10-27 MED ORDER — ATORVASTATIN CALCIUM 40 MG PO TABS
40.0000 mg | ORAL_TABLET | Freq: Every day | ORAL | Status: DC
  Administered 2013-10-28 – 2013-11-01 (×5): 40 mg via ORAL
  Filled 2013-10-27 (×6): qty 1

## 2013-10-27 MED ORDER — IPRATROPIUM BROMIDE 0.02 % IN SOLN
0.5000 mg | RESPIRATORY_TRACT | Status: DC | PRN
  Administered 2013-10-27: 0.5 mg via RESPIRATORY_TRACT
  Filled 2013-10-27: qty 2.5

## 2013-10-27 MED ORDER — LISINOPRIL 20 MG PO TABS
20.0000 mg | ORAL_TABLET | Freq: Every day | ORAL | Status: DC
  Administered 2013-10-27 – 2013-11-02 (×7): 20 mg via ORAL
  Filled 2013-10-27 (×8): qty 1

## 2013-10-27 MED ORDER — ALBUTEROL SULFATE (2.5 MG/3ML) 0.083% IN NEBU
2.5000 mg | INHALATION_SOLUTION | RESPIRATORY_TRACT | Status: DC | PRN
  Administered 2013-10-27 – 2013-10-29 (×3): 2.5 mg via RESPIRATORY_TRACT
  Filled 2013-10-27 (×2): qty 0.5

## 2013-10-27 MED ORDER — INSULIN GLARGINE 100 UNIT/ML ~~LOC~~ SOLN
30.0000 [IU] | Freq: Every day | SUBCUTANEOUS | Status: DC
  Administered 2013-10-27 – 2013-11-01 (×6): 30 [IU] via SUBCUTANEOUS
  Filled 2013-10-27 (×7): qty 0.3

## 2013-10-27 MED ORDER — IPRATROPIUM-ALBUTEROL 18-103 MCG/ACT IN AERO
2.0000 | INHALATION_SPRAY | Freq: Three times a day (TID) | RESPIRATORY_TRACT | Status: DC | PRN
  Filled 2013-10-27: qty 14.7

## 2013-10-27 NOTE — Consult Note (Addendum)
.   History & Physical Note  Cardiologist: Degent previously (none currently)  HPI:  Stephen [name] is a 72 y/o male with multiple medical problems including CAD s/p MV stenting followed by CABG 2011, COPD with ongoing tobacco use, DM2 and PAD. Transferred from [facility] for NSTEMI.   He is s/p stenting LAD and RCA in 2007. In 2011 presented with [place]. Cath with in-stent stenosis of the LAD with three-vessel disease. He underwent left IMA grafting to his LAD and vein grafts to the ramus intermediate and posterolateral branch of the right coronary which were small diabetic vessels. His EF was 30%.  He has a limited education and is an extremely poor historian. Says that since his CABG he has had chronic pain and clicking in his chest as well as an incisional hernia at the base of his scar. It is unclear how much this has troubled him.   According to the records from [place] he presented to the ER with URI symptoms several days ago and was sent home. He presented to ER again on 12/23 with worsening dyspnea and CP. Admitted for presumptive COPD flare. Treated with levofloxacin, ceftriaxone, steroids and nebs. However troponin bumped from 0.03->0.04->0.61 so was started on heparin and transferred here for further evaluation. ECG with iLBBB without acute ST-T changes.  On ROS, endorses significant claudication. No bleeding. Continues to smoke 1.5 ppd. Has been depressed recently as his wife went to live with her daughter due to illness.    Review of Systems:     Cardiac Review of Systems: {Y] = yes [ ]  = no  Chest Pain [ y   ]  Resting SOB [ y  ] Exertional SOB  [  ]  Orthopnea [  ]   Pedal Edema [   ]    Palpitations [  ] Syncope  [  ]   Presyncope [   ]  General Review of Systems: [Y] = yes [  ]=no Constitional: recent weight change [  ]; anorexia [  ]; fatigue [ y ]; nausea [  ]; night sweats [  ]; fever [  ]; or chills [  ];                                                                                                                                           Dental: poor dentition[  y]  Eye : blurred vision [  ]; diplopia [   ]; vision changes [  ];  Amaurosis fugax[  ]; Resp: cough Cove.Etienne  ];  wheezing[ y ];  hemoptysis[  ]; shortness of breath[ y ]; paroxysmal nocturnal dyspnea[  ]; dyspnea on exertion[  ]; or orthopnea[  ];  GI:  gallstones[  ], vomiting[  ];  dysphagia[  ]; melena[  ];  hematochezia [  ]; heartburn[  ];   GU: kidney stones [  ];  hematuria[  ];   dysuria [  ];  nocturia[  ];  history of     obstruction [  ];                 Skin: rash, swelling[  ];, hair loss[  y];  peripheral edema[  ];  or itching[  ]; Musculosketetal: myalgias[  ];  joint swelling[  ];  joint erythema[  ];  joint pain[ y ];  back pain[  ];  Heme/Lymph: bruising[  ];  bleeding[  ];  anemia[  ];  Neuro: TIA[  ];  headaches[  ];  stroke[  ];  vertigo[  ];  seizures[  ];   paresthesias[  ];  difficulty walking[  y];  Psych:depression[ y ]; anxiety[  ];  Endocrine: diabetes[  y];  thyroid dysfunction[  ];   Past Medical History  Diagnosis Date  . Diabetes mellitus   . Hypertension   . Heart disease   . Pancreatitis   . Neuropathy   . Hyperlipemia   . Peripheral vascular disease   . GERD (gastroesophageal reflux disease)   . NSTEMI (non-ST elevated myocardial infarction)   . S/P colonoscopy July 2003    hyperplastic polyp, diverticulosis, anal papilla and internal hemorrhoids r  . S/P endoscopy 2003    Dr. Tish Men: erosive reflux esophagitis , PUD  . PUD (peptic ulcer disease)     diagnosed via EGD by Dr. Tish Men at [place]  . Diverticulosis   . Hemorrhoids     Medications Prior to Admission  Medication Sig Dispense Refill  . albuterol-ipratropium (COMBIVENT) 18-103 MCG/ACT inhaler Inhale 2 puffs into the lungs every 8 (eight) hours as needed.        [name] aspirin EC 325 MG tablet Take 325 mg by mouth daily. Takes 2 tabs every other day      . glipiZIDE (GLUCOTROL XL) 2.5 MG 24 hr  tablet Take 2.5 mg by mouth 2 (two) times daily.       [name] HYDROcodone-acetaminophen (VICODIN) 5-500 MG per tablet Take 1 tablet by mouth 3 (three) times daily as needed. FOR LEG PAIN       . Insulin Glargine (LANTUS SOLOSTAR [place]) Inject 30 Units into the skin daily.       . metFORMIN (GLUCOPHAGE) 1000 MG tablet Take 1,000 mg by mouth daily with breakfast.      . Pancrelipase, Lip-Prot-Amyl, 24000 UNITS CPEP Take 3 capsules (72,000 Units total) by mouth 3 (three) times daily with meals. Take 2 with snacks.  400 capsule  11  . SYMBICORT 160-4.5 MCG/ACT inhaler Inhale 2 puffs into the lungs 2 (two) times daily.       [name] lisinopril (PRINIVIL,ZESTRIL) 20 MG tablet Take 20 mg by mouth daily.        . metoprolol tartrate (LOPRESSOR) 25 MG tablet Take 25 mg by mouth daily.       . nitroGLYCERIN (NITROSTAT) 0.4 MG SL tablet Place 0.4 mg under the tongue every 5 (five) minutes x 3 doses as needed.      [name] omeprazole (PRILOSEC) 20 MG capsule Take 20 mg by mouth daily as needed. Acid reflux      . spironolactone (ALDACTONE) 25 MG tablet Take 25 mg by mouth daily.           No Known Allergies  History   Social History  . Marital Status: Married    Spouse Name: N/A    Number of Children: N/A  . Years of Education:  N/A   Occupational History  . Not on file.   Social History Main Topics  . Smoking status: Current Every Day Smoker -- 0.50 packs/day for 62 years    Types: Cigarettes  . Smokeless tobacco: Not on file     Comment: on last pack now then will quit  . Alcohol Use: No     Comment: Quit 1985  . Drug Use: No  . Sexual Activity: Not on file   Other Topics Concern  . Not on file   Social History Narrative  . No narrative on file    Family History  Problem Relation Age of Onset  . Coronary artery disease Mother   . Coronary artery disease Father   . Diabetes Sister   . Suicidality Brother   . Cirrhosis Brother   . Colon cancer Neg Hx     PHYSICAL EXAM: Filed Vitals:   10/27/13  1707  BP: 171/62  Pulse: 81  Temp: 98.4 F (36.9 C)  Resp: 15   General:  Fatigue appearing. No respiratory difficulty HEENT: normal x for edentulous Neck: supple. no JVD. Carotids 2+ bilat; no bruits. No lymphadenopathy or thryomegaly appreciated. Cor: PMI nondisplaced. Regular rate & rhythm. No rubs, gallops or murmurs. Lungs: mild rhonchi with mild end exp wheezing Abdomen: soft, nontender, nondistended. No hepatosplenomegaly. No bruits or masses. Good bowel sounds. Extremities: no cyanosis, clubbing, rash, edema. Loss of hair from knee down. Fem pulses 1+ bilaterally with bilateral bruits. DPs non-palpable Neuro: alert & oriented x 3, cranial nerves grossly intact. moves all 4 extremities w/o difficulty. Affect pleasant.  ECG: NSR with iLBBB. No signifcant ST-T abnormalities  Labs from [facility]:    Na 129 K 4.0 BUN 17 Cr 1.23 Glu 249  WBC 9.4 Hgb 12.6 Plt 179  Trop 0.03->0.04->0.61 BNP 203  ASSESSMENT: 1. NSTEMI 2. COPD with possible flare 3. CAD s/p CABG 2011  4. iCM EF 30% in 2011 5. PAD with intermittent claudication 6. DM2 7. COPD with ongoing tobacco use. 8. Hyponatremia    PLAN/DISCUSSION:  Patient is difficult historian but overall situation concerning for small NSTEMI in setting of URI/COPD flare. Respiratory status now improved. Currently no infectious symptoms or CP.   With previous CABG, DM2 and ongoing tobacco use, I feel he needs cardiac cath and will schedule for Friday. Will also check echo. Treat with ASA, heparin, statin. No b-blocker due to COPD with active wheezing.  Cover DM2 with SSI. Check HGBa1c. Will use nebs for COPD. No need for steroids or abx currently  Exam very concerning for PAD. Check ab u/s and ABIs.  Truman Hayward 6:46 PM

## 2013-10-27 NOTE — H&P (Signed)
.   History & Physical Note  Cardiologist: Degent previously (none currently)  HPI:  Stephen [name] is a 72 y/o male with multiple medical problems including CAD s/p MV stenting followed by CABG 2011, COPD with ongoing tobacco use, DM2 and PAD. Transferred from [facility] for NSTEMI.   He is s/p stenting LAD and RCA in 2007. In 2011 presented with [place]. Cath with in-stent stenosis of the LAD with three-vessel disease. He underwent left IMA grafting to his LAD and vein grafts to the ramus intermediate and posterolateral branch of the right coronary which were small diabetic vessels. His EF was 30%.  He has a limited education and is an extremely poor historian. Says that since his CABG he has had chronic pain and clicking in his chest as well as an incisional hernia at the base of his scar. It is unclear how much this has troubled him.   According to the records from [place] he presented to the ER with URI symptoms several days ago and was sent home. He presented to ER again on 12/23 with worsening dyspnea and CP. Admitted for presumptive COPD flare. Treated with levofloxacin, ceftriaxone, steroids and nebs. However troponin bumped from 0.03->0.04->0.61 so was started on heparin and transferred here for further evaluation. ECG with iLBBB without acute ST-T changes.  On ROS, endorses significant claudication. No bleeding. Continues to smoke 1.5 ppd. Has been depressed recently as his wife went to live with her daughter due to illness.    Review of Systems:     Cardiac Review of Systems: {Y] = yes [ ]  = no  Chest Pain [ y   ]  Resting SOB [ y  ] Exertional SOB  [  ]  Orthopnea [  ]   Pedal Edema [   ]    Palpitations [  ] Syncope  [  ]   Presyncope [   ]  General Review of Systems: [Y] = yes [  ]=no Constitional: recent weight change [  ]; anorexia [  ]; fatigue [ y ]; nausea [  ]; night sweats [  ]; fever [  ]; or chills [  ];                                                                                                                                           Dental: poor dentition[  y]  Eye : blurred vision [  ]; diplopia [   ]; vision changes [  ];  Amaurosis fugax[  ]; Resp: cough Cove.Etienne  ];  wheezing[ y ];  hemoptysis[  ]; shortness of breath[ y ]; paroxysmal nocturnal dyspnea[  ]; dyspnea on exertion[  ]; or orthopnea[  ];  GI:  gallstones[  ], vomiting[  ];  dysphagia[  ]; melena[  ];  hematochezia [  ]; heartburn[  ];   GU: kidney stones [  ];  hematuria[  ];   dysuria [  ];  nocturia[  ];  history of     obstruction [  ];                 Skin: rash, swelling[  ];, hair loss[  y];  peripheral edema[  ];  or itching[  ]; Musculosketetal: myalgias[  ];  joint swelling[  ];  joint erythema[  ];  joint pain[ y ];  back pain[  ];  Heme/Lymph: bruising[  ];  bleeding[  ];  anemia[  ];  Neuro: TIA[  ];  headaches[  ];  stroke[  ];  vertigo[  ];  seizures[  ];   paresthesias[  ];  difficulty walking[  y];  Psych:depression[ y ]; anxiety[  ];  Endocrine: diabetes[  y];  thyroid dysfunction[  ];   Past Medical History  Diagnosis Date  . Diabetes mellitus   . Hypertension   . Heart disease   . Pancreatitis   . Neuropathy   . Hyperlipemia   . Peripheral vascular disease   . GERD (gastroesophageal reflux disease)   . NSTEMI (non-ST elevated myocardial infarction)   . S/P colonoscopy July 2003    hyperplastic polyp, diverticulosis, anal papilla and internal hemorrhoids r  . S/P endoscopy 2003    Dr. Tish Men: erosive reflux esophagitis , PUD  . PUD (peptic ulcer disease)     diagnosed via EGD by Dr. Tish Men at [place]  . Diverticulosis   . Hemorrhoids     Medications Prior to Admission  Medication Sig Dispense Refill  . albuterol-ipratropium (COMBIVENT) 18-103 MCG/ACT inhaler Inhale 2 puffs into the lungs every 8 (eight) hours as needed.        [name] aspirin EC 325 MG tablet Take 325 mg by mouth daily. Takes 2 tabs every other day      . glipiZIDE (GLUCOTROL XL) 2.5 MG 24 hr  tablet Take 2.5 mg by mouth 2 (two) times daily.       [name] HYDROcodone-acetaminophen (VICODIN) 5-500 MG per tablet Take 1 tablet by mouth 3 (three) times daily as needed. FOR LEG PAIN       . Insulin Glargine (LANTUS SOLOSTAR ) Inject 30 Units into the skin daily.       . metFORMIN (GLUCOPHAGE) 1000 MG tablet Take 1,000 mg by mouth daily with breakfast.      . Pancrelipase, Lip-Prot-Amyl, 24000 UNITS CPEP Take 3 capsules (72,000 Units total) by mouth 3 (three) times daily with meals. Take 2 with snacks.  400 capsule  11  . SYMBICORT 160-4.5 MCG/ACT inhaler Inhale 2 puffs into the lungs 2 (two) times daily.       [name] lisinopril (PRINIVIL,ZESTRIL) 20 MG tablet Take 20 mg by mouth daily.        . metoprolol tartrate (LOPRESSOR) 25 MG tablet Take 25 mg by mouth daily.       . nitroGLYCERIN (NITROSTAT) 0.4 MG SL tablet Place 0.4 mg under the tongue every 5 (five) minutes x 3 doses as needed.      [name] omeprazole (PRILOSEC) 20 MG capsule Take 20 mg by mouth daily as needed. Acid reflux      . spironolactone (ALDACTONE) 25 MG tablet Take 25 mg by mouth daily.           No Known Allergies  History   Social History  . Marital Status: Married    Spouse Name: N/A    Number of Children: N/A  . Years of Education:  N/A   Occupational History  . Not on file.   Social History Main Topics  . Smoking status: Current Every Day Smoker -- 0.50 packs/day for 62 years    Types: Cigarettes  . Smokeless tobacco: Not on file     Comment: on last pack now then will quit  . Alcohol Use: No     Comment: Quit 1985  . Drug Use: No  . Sexual Activity: Not on file   Other Topics Concern  . Not on file   Social History Narrative  . No narrative on file    Family History  Problem Relation Age of Onset  . Coronary artery disease Mother   . Coronary artery disease Father   . Diabetes Sister   . Suicidality Brother   . Cirrhosis Brother   . Colon cancer Neg Hx     PHYSICAL EXAM: Filed Vitals:   10/27/13  1707  BP: 171/62  Pulse: 81  Temp: 98.4 F (36.9 C)  Resp: 15   General:  Fatigue appearing. No respiratory difficulty HEENT: normal x for edentulous Neck: supple. no JVD. Carotids 2+ bilat; no bruits. No lymphadenopathy or thryomegaly appreciated. Cor: PMI nondisplaced. Regular rate & rhythm. No rubs, gallops or murmurs. Lungs: mild rhonchi with mild end exp wheezing Abdomen: soft, nontender, nondistended. No hepatosplenomegaly. No bruits or masses. Good bowel sounds. Extremities: no cyanosis, clubbing, rash, edema. Loss of hair from knee down. Fem pulses 1+ bilaterally with bilateral bruits. DPs non-palpable Neuro: alert & oriented x 3, cranial nerves grossly intact. moves all 4 extremities w/o difficulty. Affect pleasant.  ECG: NSR with iLBBB. No signifcant ST-T abnormalities  Labs from [facility]:    Na 129 K 4.0 BUN 17 Cr 1.23 Glu 249  WBC 9.4 Hgb 12.6 Plt 179  Trop 0.03->0.04->0.61 BNP 203  ASSESSMENT: 1. NSTEMI 2. COPD with possible flare 3. CAD s/p CABG 2011  4. iCM EF 30% in 2011 5. PAD with intermittent claudication 6. DM2 7. COPD with ongoing tobacco use. 8. Hyponatremia    PLAN/DISCUSSION:  Patient is difficult historian but overall situation concerning for small NSTEMI in setting of URI/COPD flare. Respiratory status now improved. Currently no infectious symptoms or CP.   With previous CABG, DM2 and ongoing tobacco use, I feel he needs cardiac cath and will schedule for Friday. Will also check echo. Treat with ASA, heparin, statin. No b-blocker due to COPD with active wheezing.  Cover DM2 with SSI. Check HGBa1c. Will use nebs for COPD. No need for steroids or abx currently  Exam very concerning for PAD. Check ab u/s and ABIs.  Truman Hayward 6:47 PM

## 2013-10-27 NOTE — Progress Notes (Signed)
ANTICOAGULATION CONSULT NOTE - Initial Consult  Pharmacy Consult for Heparin Indication: chest pain/ACS  No Known Allergies  Patient Measurements: Height: 5\' 10"  (177.8 cm) Weight: 177 lb (80.287 kg) (from admit 2013) IBW/kg (Calculated) : 73   Vital Signs: Temp: 98.4 F (36.9 C) (12/24 1707) Temp src: Oral (12/24 1707) BP: 171/62 mmHg (12/24 1707) Pulse Rate: 81 (12/24 1707)  Labs:  Recent Labs  10/27/13 1830  HEPARINUNFRC 0.34  TROPONINI 1.02*    Estimated Creatinine Clearance: 68.2 ml/min (by C-G formula based on Cr of 1.04).   Medical History: Past Medical History  Diagnosis Date  . Diabetes mellitus   . Hypertension   . Heart disease   . Pancreatitis   . Neuropathy   . Hyperlipemia   . Peripheral vascular disease   . GERD (gastroesophageal reflux disease)   . NSTEMI (non-ST elevated myocardial infarction)   . S/P colonoscopy July 2003    hyperplastic polyp, diverticulosis, anal papilla and internal hemorrhoids r  . S/P endoscopy 2003    Dr. Tish Men: erosive reflux esophagitis , PUD  . PUD (peptic ulcer disease)     diagnosed via EGD by Dr. Tish Men at [place]  . Diverticulosis   . Hemorrhoids       Assessment: 72yom seen at [facility] ED for SOB - COPD exaserbation 3 days ago came back with same SOB but also noted to have Tp elevated and transferred to [facility].  Heparin drip started at [facility] hospit[facility]  1200 uts/hr HL 0.34.       Goal of Therapy:  Heparin level 0.3-0.7 units/ml Monitor platelets by anticoagulation protocol: Yes   Plan:  Continue Heparin drip 1200 uts/hr Daily Heparin Level, CBC  Leota Sauers Pharm.D. CPP, BCPS Clinical Pharmacist [phone] 10/27/2013 9:00 PM

## 2013-10-28 ENCOUNTER — Encounter (HOSPITAL_COMMUNITY): Payer: Self-pay | Admitting: *Deleted

## 2013-10-28 ENCOUNTER — Inpatient Hospital Stay (HOSPITAL_COMMUNITY): Payer: Medicare Other

## 2013-10-28 DIAGNOSIS — I70219 Atherosclerosis of native arteries of extremities with intermittent claudication, unspecified extremity: Secondary | ICD-10-CM

## 2013-10-28 DIAGNOSIS — I718 Aortic aneurysm of unspecified site, ruptured: Secondary | ICD-10-CM

## 2013-10-28 DIAGNOSIS — I714 Abdominal aortic aneurysm, without rupture: Secondary | ICD-10-CM

## 2013-10-28 DIAGNOSIS — I10 Essential (primary) hypertension: Secondary | ICD-10-CM

## 2013-10-28 DIAGNOSIS — Z9889 Other specified postprocedural states: Secondary | ICD-10-CM

## 2013-10-28 DIAGNOSIS — I059 Rheumatic mitral valve disease, unspecified: Secondary | ICD-10-CM

## 2013-10-28 DIAGNOSIS — Z951 Presence of aortocoronary bypass graft: Secondary | ICD-10-CM

## 2013-10-28 DIAGNOSIS — E138 Other specified diabetes mellitus with unspecified complications: Secondary | ICD-10-CM

## 2013-10-28 DIAGNOSIS — I5022 Chronic systolic (congestive) heart failure: Secondary | ICD-10-CM

## 2013-10-28 DIAGNOSIS — I259 Chronic ischemic heart disease, unspecified: Secondary | ICD-10-CM

## 2013-10-28 DIAGNOSIS — J449 Chronic obstructive pulmonary disease, unspecified: Secondary | ICD-10-CM

## 2013-10-28 DIAGNOSIS — M79609 Pain in unspecified limb: Secondary | ICD-10-CM

## 2013-10-28 DIAGNOSIS — I509 Heart failure, unspecified: Secondary | ICD-10-CM

## 2013-10-28 DIAGNOSIS — E785 Hyperlipidemia, unspecified: Secondary | ICD-10-CM

## 2013-10-28 LAB — CBC
HCT: 33.8 % — ABNORMAL LOW (ref 39.0–52.0)
Hemoglobin: 11.3 g/dL — ABNORMAL LOW (ref 13.0–17.0)
MCH: 29.8 pg (ref 26.0–34.0)
MCHC: 33.4 g/dL (ref 30.0–36.0)
MCV: 89.2 fL (ref 78.0–100.0)

## 2013-10-28 LAB — LIPID PANEL
HDL: 53 mg/dL (ref >39)
LDL Cholesterol: 76 mg/dL (ref 0–99)

## 2013-10-28 LAB — COMPREHENSIVE METABOLIC PANEL
ALT: 9 U/L (ref 0–53)
AST: 17 U/L (ref 0–37)
Alkaline Phosphatase: 56 U/L (ref 39–117)
CO2: 29 mEq/L (ref 19–32)
Chloride: 99 mEq/L (ref 96–112)
Creatinine, Ser: 1.11 mg/dL (ref 0.50–1.35)
GFR calc Af Amer: 76 mL/min — ABNORMAL LOW (ref >90)
GFR calc non Af Amer: 65 mL/min — ABNORMAL LOW (ref >90)
Glucose, Bld: 206 mg/dL — ABNORMAL HIGH (ref 70–99)
Potassium: 3.7 mEq/L (ref 3.5–5.1)
Sodium: 137 mEq/L (ref 135–145)
Total Bilirubin: 0.1 mg/dL — ABNORMAL LOW (ref 0.3–1.2)

## 2013-10-28 LAB — GLUCOSE, CAPILLARY
Glucose-Capillary: 161 mg/dL — ABNORMAL HIGH (ref 70–99)
Glucose-Capillary: 46 mg/dL — ABNORMAL LOW (ref 70–99)
Glucose-Capillary: 149 mg/dL — ABNORMAL HIGH (ref 70–99)
Glucose-Capillary: 236 mg/dL — ABNORMAL HIGH (ref 70–99)

## 2013-10-28 LAB — ACETAMINOPHEN LEVEL: Acetaminophen (Tylenol), Serum: <15 ug/mL (ref 10–30)

## 2013-10-28 LAB — HEPARIN LEVEL (UNFRACTIONATED): Heparin Unfractionated: 0.39 IU/mL (ref 0.30–0.70)

## 2013-10-28 LAB — TROPONIN I: Troponin I: 0.74 ng/mL (ref <0.30)

## 2013-10-28 MED ORDER — BIOTENE DRY MOUTH MT LIQD
15.0000 mL | Freq: Two times a day (BID) | OROMUCOSAL | Status: DC
  Administered 2013-10-28 – 2013-11-02 (×9): 15 mL via OROMUCOSAL

## 2013-10-28 MED ORDER — IPRATROPIUM-ALBUTEROL 20-100 MCG/ACT IN AERS
2.0000 | INHALATION_SPRAY | Freq: Four times a day (QID) | RESPIRATORY_TRACT | Status: DC | PRN
  Administered 2013-10-28: 2 via RESPIRATORY_TRACT
  Filled 2013-10-28: qty 4

## 2013-10-28 MED ORDER — HEPARIN (PORCINE) IN NACL 100-0.45 UNIT/ML-% IJ SOLN
1400.0000 [IU]/h | INTRAMUSCULAR | Status: DC
  Administered 2013-10-29: 1400 [IU]/h via INTRAVENOUS
  Filled 2013-10-28 (×4): qty 250

## 2013-10-28 NOTE — Progress Notes (Signed)
VASCULAR LAB PRELIMINARY  ARTERIAL  ABI completed: Bilateral severe arterial disease.    RIGHT    LEFT    PRESSURE WAVEFORM  PRESSURE WAVEFORM  BRACHIAL 165 Tri BRACHIAL 153 Tri  DP   DP    AT 80 Mono  AT absent   PT 79 Mono  PT 54 Damp mono   PER   PER absent   GREAT TOE  NA GREAT TOE  NA    RIGHT LEFT  ABI 0.48 0.33     Farrel Demark, RDMS, RVT  10/28/2013, 9:23 AM

## 2013-10-28 NOTE — Progress Notes (Signed)
pts CBG 46; pt has been NPO all morning for abd [place]. CBG this AM 161 and received 2 units novolog; pt asymptomatic, currently up eating lunch tray; CBG recheck is 149; Joni Reining made aware. No new orders. Will continue to monitor

## 2013-10-28 NOTE — Consult Note (Signed)
The patient was seen and examined, and I agree with the assessment and plan as documented above, with modifications as noted below. Continue current medical therapy with ASA, statin, and heparin. Ischemic cardiomyopathy, EF 45-50%. Currently not in heart failure, and denies chest pain. Plan for cath tomorrow.

## 2013-10-28 NOTE — Progress Notes (Signed)
  Echocardiogram 2D Echocardiogram has been performed.  Stephen [name] 10/28/2013, 9:02 AM

## 2013-10-28 NOTE — Consult Note (Signed)
Consulting cardiologist: Bensimhon  Subjective:    Still complains of coughing, chest discomfort it better.  Objective:   Temp:  [98.1 F (36.7 C)-98.4 F (36.9 C)] 98.2 F (36.8 C) (12/25 0434) Pulse Rate:  [76-84] 76 (12/25 0434) Resp:  [15-18] 18 (12/25 0434) BP: (141-171)/(58-68) 150/68 mmHg (12/25 0434) SpO2:  [98 %-100 %] 100 % (12/25 0434) Weight:  [167 lb 8 oz (75.978 kg)-177 lb (80.287 kg)] 167 lb 8 oz (75.978 kg) (12/25 0434) Last BM Date: 10/26/13  Filed Weights   10/27/13 1708 10/28/13 0434  Weight: 177 lb (80.287 kg) 167 lb 8 oz (75.978 kg)    Intake/Output Summary (Last 24 hours) at 10/28/13 0740 Last data filed at 10/28/13 0520  Gross per 24 hour  Intake      0 ml  Output    675 ml  Net   -675 ml     Exam:  General: No acute distress.  HEENT: Conjunctiva and lids normal, oropharynx clear.  Lungs: Coarse breath sounds with rhonchi inspiratory and expiratory, with prolonged expiratory phase.   Cardiac: No elevated JVP or bruits. RRR, distant, easily obscured by breath sounds.   Abdomen: Normoactive bowel sounds, nontender, nondistended.  Extremities: No pitting edema, distal pulses full. Clubbing is noted.  Neuropsychiatric: Alert and oriented x3, affect appropriate.   Lab Results:  Basic Metabolic Panel:  Recent Labs Lab 10/28/13 0609  NA 137  K 3.7  CL 99  CO2 29  GLUCOSE 206*  BUN 18  CREATININE 1.11  CALCIUM 8.7    Liver Function Tests:  Recent Labs Lab 10/28/13 0609  AST 17  ALT 9  ALKPHOS 56  BILITOT 0.1*  PROT 5.8*  ALBUMIN 2.0*    CBC:  Recent Labs Lab 10/28/13 0609  WBC 6.4  HGB 11.3*  HCT 33.8*  MCV 89.2  PLT 199    Cardiac Enzymes:  Recent Labs Lab 10/27/13 1830 10/27/13 2206 10/28/13 0609  TROPONINI 1.02* 0.79* 0.74*    Radiology: Dg Chest 2 View  10/27/2013   CLINICAL DATA:  Chest discomfort.  Shortness of breath.  EXAM: CHEST  2 VIEW  COMPARISON:  PA and lateral chest 10/26/2013  and 02/06/2011.  FINDINGS: The lungs appear emphysematous. Patchy basilar airspace disease seen on the prior study persists without marked change. Heart size is normal. The patient is status post CABG. No pneumothorax or pleural effusion.  IMPRESSION: No change in patchy basilar airspace disease since yesterday's examination which could be due to atelectasis or pneumonia.  Emphysema.   Electronically Signed   By: Drusilla Kanner M.D.   On: 10/27/2013 21:27     ECG: SR with frequent PVC's.    Medications:   Scheduled Medications: . [START ON 10/29/2013] aspirin  81 mg Oral Pre-Cath  . aspirin EC  325 mg Oral Daily  . atorvastatin  40 mg Oral q1800  . budesonide-formoterol  2 puff Inhalation BID  . fenofibrate  160 mg Oral Daily  . glipiZIDE  2.5 mg Oral BID  . insulin aspart  0-15 Units Subcutaneous TID WC  . insulin glargine  30 Units Subcutaneous QHS  . lipase/protease/amylase  2-3 capsule Oral TID AC  . lisinopril  20 mg Oral Daily  . pantoprazole  40 mg Oral Daily  . sodium chloride  3 mL Intravenous Q12H  . sodium chloride  3 mL Intravenous Q12H  . spironolactone  25 mg Oral Daily     Infusions: . [START ON 10/29/2013] sodium chloride  PRN Medications:  sodium chloride, sodium chloride, acetaminophen, albuterol, ALPRAZolam, HYDROcodone-acetaminophen, ipratropium, nitroGLYCERIN, ondansetron (ZOFRAN) IV, sodium chloride, sodium chloride, zolpidem   Assessment and Plan:  1. NSTEMI: Elevated cardiac markers with associated chest pressure and dyspnea. He has history of CAD. Trending downward. Plans for cardiac cath in am of 10/29/2013. Chest discomfort is improved. Remains on heparin gtt.   2. CAD: S/P stenting LAD and RCA in 2007. In 2011 presented with [place]. Cath with in-stent stenosis of the LAD with three-vessel disease. He underwent left IMA grafting to his LAD and vein grafts to the ramus intermediate and posterolateral branch of the right coronary which were small  diabetic vessels. His EF was 30%.  Continue ACE and ASA. Not on BB due to lung disease.  3. COPD: Chronic dyspnea and coughing. On inhaled steroids.   4. Diabetes: Slightly elevated. Continues on management per PTH. Hgb A1C 10.9. Abdominal ultrasound planned today due to elevated pancreatic enzymes.     Bettey Mare. Lyman Bishop NP [facility] 10/28/2013, 7:40 AM

## 2013-10-28 NOTE — Progress Notes (Addendum)
ANTICOAGULATION CONSULT NOTE - Initial Consult  Pharmacy Consult for Heparin Indication: chest pain/ACS  No Known Allergies  Patient Measurements: Height: 5\' 10"  (177.8 cm) Weight: 167 lb 8 oz (75.978 kg) IBW/kg (Calculated) : 73   Vital Signs: Temp: 98.2 F (36.8 C) (12/25 0434) Temp src: Oral (12/25 0434) BP: 150/68 mmHg (12/25 0434) Pulse Rate: 76 (12/25 0434)  Labs:  Recent Labs  10/27/13 1830 10/27/13 2206 10/28/13 0609  HGB  --   --  11.3*  HCT  --   --  33.8*  PLT  --   --  199  HEPARINUNFRC 0.34  --  0.21*  CREATININE  --   --  1.11  TROPONINI 1.02* 0.79* 0.74*    Estimated Creatinine Clearance: 63.9 ml/min (by C-G formula based on Cr of 1.11).   Medical History: Past Medical History  Diagnosis Date  . Diabetes mellitus   . Hypertension   . Heart disease   . Pancreatitis   . Neuropathy   . Hyperlipemia   . Peripheral vascular disease   . GERD (gastroesophageal reflux disease)   . NSTEMI (non-ST elevated myocardial infarction)   . S/P colonoscopy July 2003    hyperplastic polyp, diverticulosis, anal papilla and internal hemorrhoids r  . S/P endoscopy 2003    Dr. Tish Men: erosive reflux esophagitis , PUD  . PUD (peptic ulcer disease)     diagnosed via EGD by Dr. Tish Men at [place]  . Diverticulosis   . Hemorrhoids     Assessment: 72yom seen at [facility] ED for SOB - COPD exacerbation 3 days ago came back with same SOB but also noted to have Tp elevated and transferred to [facility].  Heparin drip started at [facility] 1200 uts/hr.   HL now slightly subtherapeutic on 1200 units/hr. Though order not entered, heparin continued overnight at 1200 units/hr. No signs of bleeding noted.  Will enter order to give bolus and adjust rate now.     Goal of Therapy:  Heparin level 0.3-0.7 units/ml Monitor platelets by anticoagulation protocol: Yes   Plan:  Increase heparin rate to 1400 units/hr  Check 6 hour Heparin level Daily heparin  levels, CBC  Thank you, Piedad Climes, PharmD Clinical Pharmacist - Resident Pager: [phone] Pharmacy: [phone] 10/28/2013 7:53 AM   Addendum:  6 hour heparin level is therapeutic at 0.39. Continue heparin at 1400 units/hr and follow up heparin level in the morning. Plan for cath tomorrow.  Louie Casa, PharmD, BCPS 10/28/2013, 3:09 PM

## 2013-10-29 ENCOUNTER — Encounter (HOSPITAL_COMMUNITY)
Admission: AD | Disposition: A | Discharge status: Home / Self Care | Payer: Self-pay | Source: Other Acute Inpatient Hospital | Attending: Internal Medicine

## 2013-10-29 ENCOUNTER — Inpatient Hospital Stay (HOSPITAL_COMMUNITY): Payer: Medicare Other

## 2013-10-29 DIAGNOSIS — I2510 Atherosclerotic heart disease of native coronary artery without angina pectoris: Secondary | ICD-10-CM

## 2013-10-29 DIAGNOSIS — J189 Pneumonia, unspecified organism: Secondary | ICD-10-CM | POA: Diagnosis present

## 2013-10-29 DIAGNOSIS — J441 Chronic obstructive pulmonary disease with (acute) exacerbation: Secondary | ICD-10-CM | POA: Diagnosis present

## 2013-10-29 HISTORY — PX: LEFT HEART CATHETERIZATION WITH CORONARY/GRAFT ANGIOGRAM: SHX5450

## 2013-10-29 LAB — POCT ACTIVATED CLOTTING TIME: Activated Clotting Time: 133 seconds

## 2013-10-29 LAB — GLUCOSE, CAPILLARY
Glucose-Capillary: 48 mg/dL — ABNORMAL LOW (ref 70–99)
Glucose-Capillary: 142 mg/dL — ABNORMAL HIGH (ref 70–99)
Glucose-Capillary: 111 mg/dL — ABNORMAL HIGH (ref 70–99)
Glucose-Capillary: 86 mg/dL (ref 70–99)
Glucose-Capillary: 319 mg/dL — ABNORMAL HIGH (ref 70–99)

## 2013-10-29 LAB — CBC
HCT: 31.1 % — ABNORMAL LOW (ref 39.0–52.0)
MCH: 29.1 pg (ref 26.0–34.0)
MCHC: 32.8 g/dL (ref 30.0–36.0)
MCV: 88.9 fL (ref 78.0–100.0)
RBC: 3.5 MIL/uL — ABNORMAL LOW (ref 4.22–5.81)
RDW: 15.5 % (ref 11.5–15.5)
WBC: 3.3 10*3/uL — ABNORMAL LOW (ref 4.0–10.5)

## 2013-10-29 LAB — PROTIME-INR: INR: 1.04 (ref 0.00–1.49)

## 2013-10-29 LAB — STREP PNEUMONIAE URINARY ANTIGEN: Strep Pneumo Urinary Antigen: NEGATIVE

## 2013-10-29 LAB — PROCALCITONIN: Procalcitonin: <0.1 ng/mL

## 2013-10-29 SURGERY — LEFT HEART CATHETERIZATION WITH CORONARY/GRAFT ANGIOGRAM
Anesthesia: LOCAL

## 2013-10-29 MED ORDER — LEVOFLOXACIN IN D5W 750 MG/150ML IV SOLN
750.0000 mg | INTRAVENOUS | Status: DC
  Administered 2013-10-29: 750 mg via INTRAVENOUS
  Filled 2013-10-29 (×2): qty 150

## 2013-10-29 MED ORDER — FENTANYL CITRATE 0.05 MG/ML IJ SOLN
INTRAMUSCULAR | Status: AC
  Filled 2013-10-29: qty 2

## 2013-10-29 MED ORDER — SODIUM CHLORIDE 0.9 % IV SOLN: INTRAVENOUS | Status: AC

## 2013-10-29 MED ORDER — ASPIRIN 81 MG PO CHEW
81.0000 mg | CHEWABLE_TABLET | Freq: Every day | ORAL | Status: DC
  Administered 2013-10-30 – 2013-11-02 (×4): 81 mg via ORAL
  Filled 2013-10-29 (×4): qty 1

## 2013-10-29 MED ORDER — NITROGLYCERIN 0.2 MG/ML ON CALL CATH LAB
INTRAVENOUS | Status: AC
  Filled 2013-10-29: qty 1

## 2013-10-29 MED ORDER — MIDAZOLAM HCL 2 MG/2ML IJ SOLN
INTRAMUSCULAR | Status: AC
  Filled 2013-10-29: qty 2

## 2013-10-29 MED ORDER — LIDOCAINE HCL (PF) 1 % IJ SOLN
INTRAMUSCULAR | Status: AC
  Filled 2013-10-29: qty 30

## 2013-10-29 MED ORDER — CLOPIDOGREL BISULFATE 75 MG PO TABS
75.0000 mg | ORAL_TABLET | Freq: Every day | ORAL | Status: DC
  Administered 2013-10-29 – 2013-11-02 (×5): 75 mg via ORAL
  Filled 2013-10-29 (×4): qty 1

## 2013-10-29 MED ORDER — OSELTAMIVIR PHOSPHATE 75 MG PO CAPS
75.0000 mg | ORAL_CAPSULE | Freq: Two times a day (BID) | ORAL | Status: DC
  Administered 2013-10-29 – 2013-11-01 (×8): 75 mg via ORAL
  Filled 2013-10-29 (×10): qty 1

## 2013-10-29 MED ORDER — HEPARIN (PORCINE) IN NACL 2-0.9 UNIT/ML-% IJ SOLN
INTRAMUSCULAR | Status: AC
  Filled 2013-10-29: qty 1000

## 2013-10-29 MED ORDER — METHYLPREDNISOLONE SODIUM SUCC 40 MG IJ SOLR
40.0000 mg | Freq: Two times a day (BID) | INTRAMUSCULAR | Status: DC
  Administered 2013-10-29 – 2013-11-02 (×9): 40 mg via INTRAVENOUS
  Filled 2013-10-29 (×10): qty 1

## 2013-10-29 MED ORDER — HYDRALAZINE HCL 20 MG/ML IJ SOLN
INTRAMUSCULAR | Status: AC
  Filled 2013-10-29: qty 1

## 2013-10-29 MED ORDER — ALBUTEROL SULFATE (5 MG/ML) 0.5% IN NEBU
INHALATION_SOLUTION | RESPIRATORY_TRACT | Status: AC
  Filled 2013-10-29: qty 0.5

## 2013-10-29 MED ORDER — IPRATROPIUM BROMIDE 0.02 % IN SOLN
0.5000 mg | Freq: Four times a day (QID) | RESPIRATORY_TRACT | Status: DC
  Administered 2013-10-29 – 2013-11-02 (×15): 0.5 mg via RESPIRATORY_TRACT
  Filled 2013-10-29 (×16): qty 2.5

## 2013-10-29 MED ORDER — ALBUTEROL SULFATE (2.5 MG/3ML) 0.083% IN NEBU
2.5000 mg | INHALATION_SOLUTION | Freq: Four times a day (QID) | RESPIRATORY_TRACT | Status: DC
  Administered 2013-10-29 – 2013-11-02 (×15): 2.5 mg via RESPIRATORY_TRACT
  Filled 2013-10-29 (×2): qty 0.5
  Filled 2013-10-29 (×2): qty 3
  Filled 2013-10-29 (×5): qty 0.5
  Filled 2013-10-29: qty 3
  Filled 2013-10-29: qty 0.5
  Filled 2013-10-29: qty 3
  Filled 2013-10-29: qty 0.5
  Filled 2013-10-29: qty 3
  Filled 2013-10-29 (×4): qty 0.5
  Filled 2013-10-29: qty 3

## 2013-10-29 MED FILL — Heparin Sodium (Porcine) 100 Unt/ML in Sodium Chloride 0.45%: INTRAMUSCULAR | Qty: 250 | Status: AC

## 2013-10-29 NOTE — CV Procedure (Addendum)
     Cardiac Catheterization Operative Report  Stephen [name] [id number] 12/26/20148:19 AM Lajean Saver, NT  Procedure Performed:  1. Left Heart Catheterization 2. Selective Coronary Angiography 3. Left ventricular angiogram 4. SVG angiography 5. LIMA graft angiography  Operator: Verne Carrow, MD  Indication:  72 yo male with history of CAD s/p CABG, COPD, tobacco abuse, DM, PAD admitted with COPD exacerbation at [facility], had mild troponin bump and transferred to [facility] for cath. Recently has had worsened dyspnea with chest pain. Peak troponin 1.0.                                      Procedure Details: The risks, benefits, complications, treatment options, and expected outcomes were discussed with the patient. The patient and/or family concurred with the proposed plan, giving informed consent. The patient was brought to the cath lab after IV hydration was begun and oral premedication was given. The patient was further sedated with Versed and Fentanyl. The right groin was prepped and draped in the usual manner. Using the modified Seldinger access technique, a 5 French sheath was placed in the right femoral artery. Standard diagnostic catheters were used to perform selective coronary angiography. An LCB catheter was used to engage the SVG to the intermediate branch. The JR4 was used to engage the LIMA graft and the SVG to the RCA. A pigtail catheter was used to perform a left ventricular angiogram.  There were no immediate complications. The patient was taken to the recovery area in stable condition.   Hemodynamic Findings: Central aortic pressure: 198/60 Left ventricular pressure: 199/3/20  Angiographic Findings:  Left main: 99% proximal stenosis. The mid vessel is calcified with diffuse 30% stenosis.   Left Anterior Descending Artery: Large caliber vessel that courses to the apex. The proximal vessel has diffuse 30% stenosis. The mid vessel is stented. There is moderate  restenosis in the stents. There is competitive flow from the patent IMA graft. Several diagonal branches are seen to fill from the graft.   Circumflex Artery: Intermediate branch is large in caliber with 50% proximal stenosis, fills from the patent vein graft and antegrade. The AV groove Circumflex is small in caliber with one small caliber obtuse marginal branch, fills from native antegrade flow and retrograde flow from the intermediate branch/vein graft. This Circumflex system is protected by the patent vein graft even though the left main has severe disease.   Right Coronary Artery: Large dominant vessel with 100% proximal stenosis. The distal vessel fills from the patent vein graft. His native vessel has severe disease proximal to the graft insertion.   Graft Anatomy:  SVG to PDA is patent.  SVG to intermediate is patent LIMA to mid LAD is patent.   Left Ventricular Angiogram: LVEF=40%. Global hypokinesis.   Impression: 1. Severe triple vessel CAD s/p CABG with 3/3 patent bypass grafts.  2. Moderate LV systolic dysfunction 3. NSTEMI secondary to demand ischemia in setting of COPD exacerbation with severe underlying CAD  Recommendations: Continue medical management of CAD. Aggressive risk factor reduction including smoking cessation, BP control. He will need continued treatment for his COPD exacerbation. I will ask Pulmonary to see him today to help with management of his COPD exacerbation.         Complications:  None. The patient tolerated the procedure well.

## 2013-10-29 NOTE — Progress Notes (Addendum)
Hypoglycemic Event  CBG:48  Treatment: 15 GM carbohydrate snack  Symptoms: None  Follow-up CBG: Time: CBG Result  Possible Reasons for Event: Unknown  Comments/MD notified: Pt CBG 48 this am. Spoke to [place] in cath lab, ok to give juice. Will recheck CBG prior to sending pt down.    Gaspar Skeeters Alexandra  Remember to initiate Hypoglycemia Order Set & complete   Dr. Clifton James notified of patient's CBG and OK with patient having juice before his cath.  Will continue to monitor. [place], Mitzi Hansen

## 2013-10-29 NOTE — Progress Notes (Signed)
Inpatient Diabetes Program Recommendations  AACE/ADA: New Consensus Statement on Inpatient Glycemic Control (2013)  Target Ranges:  Prepandial:   less than 140 mg/dL      Peak postprandial:   less than 180 mg/dL (1-2 hours)      Critically ill patients:  140 - 180 mg/dL     Results for Stephen [name], Stephen [name] (MRN [id number]) as of 10/29/2013 16:18  Ref. Range 10/28/2013 07:35 10/28/2013 11:55 10/28/2013 12:53 10/28/2013 16:51 10/28/2013 20:33  Glucose-Capillary Latest Range: 70-99 mg/dL 409 (H) 46 (L) 811 (H) 236 (H) 102 (H)    Results for Stephen [name], Stephen [name] (MRN [id number]) as of 10/29/2013 16:18  Ref. Range 10/29/2013 07:04 10/29/2013 07:36 10/29/2013 08:20 10/29/2013 08:52 10/29/2013 11:33  Glucose-Capillary Latest Range: 70-99 mg/dL 48 (L) 46 (L) 213 (H) 086 (H) 86    **Patient with hypoglycemia this morning after receiving 30 units Lantus the night before.  Noted patient started on IV steroids today.  Expect CBGs to rise with the addition of steroids.   **MD- If patient continues to have AM hypoglycemia, recommend the following:  1. Decrease Lantus to 25 units QHS 2. D/C Glipizide until patient ready to d/c   Will follow. Ambrose Finland RN, MSN, CDE Diabetes Coordinator Inpatient Diabetes Program Team Pager: [phone] (8a-10p)

## 2013-10-29 NOTE — Progress Notes (Signed)
Patient ambulated in room and hallway after bedrest was up.  Pressure dressing removed and band aid applied to right groin, level 0.  Will continue to monitor. [place], Mitzi Hansen

## 2013-10-29 NOTE — Progress Notes (Signed)
Utilization review completed.  

## 2013-10-29 NOTE — Progress Notes (Signed)
Pt transferred from [facility] with COPD exacerbation, mild bump in troponin. Cath this am with 3/3 patent bypass grafts. He will be started on Plavix 75 mg po Qdaily and will low dose ASA. He is not on a beta blocker due to his lung disease. I have asked Pulmonary to see him this am to assist with management of his COPD. Have ordered repeat PA/lateral CXR this am. 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 I have discussed with CT surgery this am. No need for intervention unless it becomes painful.   Moni Rothrock 8:43 AM 10/29/2013

## 2013-10-29 NOTE — Interval H&P Note (Signed)
History and Physical Interval Note:  10/29/2013 7:32 AM  Stephen [name]  has presented today for cardiac cath with the diagnosis of NSTEMI/known CAD.  The various methods of treatment have been discussed with the patient and family. After consideration of risks, benefits and other options for treatment, the patient has consented to  Procedure(s): LEFT HEART CATHETERIZATION WITH CORONARY/GRAFT ANGIOGRAM (N/A) as a surgical intervention .  The patient's history has been reviewed, patient examined, no change in status, stable for surgery.  I have reviewed the patient's chart and labs.  Questions were answered to the patient's satisfaction.    Cath Lab Visit (complete for each Cath Lab visit)  Clinical Evaluation Leading to the Procedure:   ACS: yes  Non-ACS:    Anginal Classification: CCS III  Anti-ischemic medical therapy: Minimal Therapy (1 class of medications)  Non-Invasive Test Results: No non-invasive testing performed  Prior CABG: Previous CABG         Stephen [name]

## 2013-10-29 NOTE — Consult Note (Signed)
Name: Stephen [name] MRN: [id number] DOB: 1941-10-25    ADMISSION DATE:  10/27/2013 CONSULTATION DATE:  12/26  REFERRING MD :  Clifton James  PRIMARY SERVICE:  Cardiology   CHIEF COMPLAINT:  eval for COPD   BRIEF PATIENT DESCRIPTION:  72 year old male active smoker w/ known CAD and prior CABG, transported to [facility] on 12/24 for cardiac cath after troponin bump after admission to [facility] for presentation c/w URI/ AECOPD. Underwent left heart cath 12/26 showing patent bypass vessels. Felt trop rise was due to demand ischemia. PCCM asked to see for COPD.   SIGNIFICANT EVENTS / STUDIES:  12/26 left heart cath: . Severe triple vessel CAD s/p CABG with 3/3 patent bypass grafts. 2. Moderate LV systolic dysfunction 3. NSTEMI secondary to demand ischemia in setting of COPD exacerbation with severe underlying    LINES / TUBES: PIV  CULTURES: Urine strep 12/26>> PCT 12/26>>> resp viral study 12/26>>>   ANTIBIOTICS: levaquin 12/26>>> tamiflu 12/26>>>  HISTORY OF PRESENT ILLNESS:    72 y/o male with multiple medical problems including CAD s/p MV stenting followed by CABG 2011, COPD with ongoing tobacco use, DM2 and PAD. Transferred from [facility] for NSTEMI. He is s/p stenting LAD and RCA in 2007. In 2011 presented with [place]. Cath with in-stent stenosis of the LAD with three-vessel disease. He underwent left IMA grafting to his LAD and vein grafts to the ramus intermediate and posterolateral branch of the right coronary which were small diabetic vessels. His EF was 30%. Reported since his  CABG he has had chronic pain and clicking in his chest as well as an incisional hernia at the base of his scar. It is unclear how much this has troubled him.   According to the records from [place] he presented to the ER with URI symptoms several days ago and was sent home. He presented to ER again on 12/23 with worsening dyspnea and CP. Admitted for presumptive COPD flare. Treated with levofloxacin,  ceftriaxone, steroids and nebs. However troponin bumped from 0.03->0.04->0.61 so was started on heparin and transferred here for further evaluation. ECG with iLBBB without acute ST-T changes.  On ROS, endorses significant claudication. No bleeding. Continues to smoke 1.5 ppd. Has been depressed recently as his wife went to live with her daughter due to illness. Had cardiac cath the am of 12/26 which showed: all if his bypass grafts to be patent, and moderate LV dysfunction and there for the elevated troponin ct was felt to be explained by demand ischemia. PCCM was asked to see in consult s/p cath to assist with management of his COPD.    PAST MEDICAL HISTORY :  Past Medical History  Diagnosis Date  . Diabetes mellitus   . Hypertension   . Heart disease   . Pancreatitis   . Neuropathy   . Hyperlipemia   . Peripheral vascular disease   . GERD (gastroesophageal reflux disease)   . NSTEMI (non-ST elevated myocardial infarction)   . S/P colonoscopy July 2003    hyperplastic polyp, diverticulosis, anal papilla and internal hemorrhoids r  . S/P endoscopy 2003    Dr. Tish Men: erosive reflux esophagitis , PUD  . PUD (peptic ulcer disease)     diagnosed via EGD by Dr. Tish Men at [place]  . Diverticulosis   . Hemorrhoids   . Coronary artery disease   . Anginal pain   . COPD (chronic obstructive pulmonary disease)   . Arthritis    Past Surgical History  Procedure Laterality Date  .  Coronary artery bypass graft  June 2011    X3  . Right inguinal hernia repair    . Cholecystectomy      3-4 years ago  . Appendectomy      age 35  . Cataracts    . Colonoscopy  05/12/2002    Dr. Jena Gauss- diverticulosis, anal papilla, internal hemorrhoids, rectal polyps  . [name] dilation  04/16/2012    Schatzki's ring-status post dilation and disruption/ as described above. Grade 1 esophageal varices. Small hiatal hernia.  [name] dilation  04/16/2012    Procedure: [name] DILATION;  Surgeon: Corbin Ade, MD;  Location: [facility];  Service: Endoscopy;  Laterality: N/A;  . Colonoscopy  08/10/2012    Procedure: COLONOSCOPY;  Surgeon: Corbin Ade, MD;  Location: [facility] END[facility];  Service: Endoscopy;  Laterality: N/A;  9:45 needs 30 mins extra /have Glucagon on hand   Prior to Admission medications   Medication Sig Start Date End Date Taking? Authorizing Provider  albuterol-ipratropium (COMBIVENT) 18-103 MCG/ACT inhaler Inhale 2 puffs into the lungs every 8 (eight) hours as needed.     Yes Historical Provider, MD  aspirin EC 325 MG tablet Take 325 mg by mouth daily. Takes 2 tabs every other day   Yes Historical Provider, MD  fenofibrate 160 MG tablet Take 160 mg by mouth daily.   Yes Historical Provider, MD  glipiZIDE (GLUCOTROL XL) 2.5 MG 24 hr tablet Take 2.5 mg by mouth 2 (two) times daily.    Yes Historical Provider, MD  HYDROcodone-acetaminophen (NORCO/VICODIN) 5-325 MG per tablet Take 1 tablet by mouth 4 (four) times daily as needed for moderate pain.   Yes Historical Provider, MD  Hydrocodone-Acetaminophen (VICODIN) 5-300 MG TABS Take 1 tablet by mouth 4 (four) times daily as needed (for pain).   Yes Historical Provider, MD  Insulin Glargine (LANTUS SOLOSTAR [place]) Inject 30 Units into the skin daily.    Yes Historical Provider, MD  lisinopril (PRINIVIL,ZESTRIL) 20 MG tablet Take 20 mg by mouth daily.     Yes Historical Provider, MD  metFORMIN (GLUCOPHAGE) 1000 MG tablet Take 1,000 mg by mouth daily with breakfast.   Yes Historical Provider, MD  metoprolol tartrate (LOPRESSOR) 25 MG tablet Take 25 mg by mouth 2 (two) times daily.   Yes Historical Provider, MD  omeprazole (PRILOSEC) 20 MG capsule Take 20 mg by mouth daily.   Yes Historical Provider, MD  Pancrelipase, Lip-Prot-Amyl, 24000 UNITS CPEP Take 3 capsules (72,000 Units total) by mouth 3 (three) times daily with meals. Take 2 with snacks. 08/05/13  Yes Tiffany Kocher, PA-C  spironolactone (ALDACTONE) 25 MG tablet Take 25 mg by mouth  daily.     Yes Historical Provider, MD  SYMBICORT 160-4.5 MCG/ACT inhaler Inhale 2 puffs into the lungs 2 (two) times daily.  07/04/12  Yes Historical Provider, MD  nitroGLYCERIN (NITROSTAT) 0.4 MG SL tablet Place 0.4 mg under the tongue every 5 (five) minutes x 3 doses as needed.    Historical Provider, MD   No Known Allergies  FAMILY HISTORY:  Family History  Problem Relation Age of Onset  . Coronary artery disease Mother   . Coronary artery disease Father   . Diabetes Sister   . Suicidality Brother   . Cirrhosis Brother   . Colon cancer Neg Hx    SOCIAL HISTORY:  reports that he has been smoking Cigarettes.  He has a 94.5 pack-year smoking history. He uses smokeless tobacco. He reports that he does not  drink alcohol or use illicit drugs.  Review of Systems:   Bolds are positive  Constitutional: weight loss, gain, night sweats, Fevers, chills, fatigue, these have all improved .  HEENT: headaches, Sore throat, sneezing, nasal congestion, post nasal drip, Difficulty swallowing, Tooth/dental problems, visual complaints visual changes, ear ache CV:  chest pain this is chronic since his bypass, got worse w/ his URI symptoms and now improved again, radiates: ,Orthopnea, PND, swelling in lower extremities, dizziness, palpitations, syncope.  GI  heartburn, indigestion, abdominal pain, nausea, vomiting, diarrhea, change in bowel habits, loss of appetite, bloody stools.  Resp: cough, productive:brown sputum , hemoptysis, dyspnea at baseline. Usually fatigue is limiting factor in activity , chest pain, pleuritic.  Skin: rash or itching or icterus GU: dysuria, change in color of urine, urgency or frequency. flank pain, hematuria  MS: joint pain or swelling. decreased range of motion  Psych: change in mood or affect. depression or anxiety.  Neuro: difficulty with speech, weakness, numbness, ataxia    SUBJECTIVE:  Feeling better  Has a non prod cough  VITAL SIGNS: Temp:  [98.1 F (36.7  C)-99.2 F (37.3 C)] 98.6 F (37 C) (12/26 0520) Pulse Rate:  [64-89] 85 (12/26 1015) Resp:  [16-18] 16 (12/26 0900) BP: (138-166)/(53-63) 138/60 mmHg (12/26 1015) SpO2:  [94 %-100 %] 98 % (12/26 0900) Weight:  [73.256 kg (161 lb 8 oz)] 73.256 kg (161 lb 8 oz) (12/26 0500)  PHYSICAL EXAMINATION: General:  Chronically ill appearing male  Neuro:  Awake, oriented no focal def  HEENT:  Chronic cleft palate  Cardiovascular:  rrr Lungs:  Scattered wheeze and rhonchi. Improve w/ cough. Some element of upper airway noises. Crackles in bases Abdomen:  Soft, non-tender  Musculoskeletal:  Intact  Skin:  Intact    Recent Labs Lab 10/28/13 0609  NA 137  K 3.7  CL 99  CO2 29  BUN 18  CREATININE 1.11  GLUCOSE 206*    Recent Labs Lab 10/28/13 0609 10/29/13 0540  HGB 11.3* 10.2*  HCT 33.8* 31.1*  WBC 6.4 3.3*  PLT 199 171   Dg Chest 2 View  10/27/2013   CLINICAL DATA:  Chest discomfort.  Shortness of breath.  EXAM: CHEST  2 VIEW  COMPARISON:  PA and lateral chest 10/26/2013 and 02/06/2011.  FINDINGS: The lungs appear emphysematous. Patchy basilar airspace disease seen on the prior study persists without marked change. Heart size is normal. The patient is status post CABG. No pneumothorax or pleural effusion.  IMPRESSION: No change in patchy basilar airspace disease since yesterday's examination which could be due to atelectasis or pneumonia.  Emphysema.   Electronically Signed   By: Drusilla Kanner M.D.   On: 10/27/2013 21:27   US Aorta  10/28/2013   CLINICAL DATA:  History of peripheral vascular disease. Evaluate for possible aortic aneurysm.  EXAM: ULTRASOUND OF ABDOMINAL AORTA  TECHNIQUE: Ultrasound examination of the abdominal aorta was performed to evaluate for abdominal aortic aneurysm.  COMPARISON:  None.  FINDINGS: Abdominal Aorta  No aneurysm identified. There is diffuse atherosclerotic plaque with vascular calcifications. No significant stenosis is evident.  Maximum AP   Diameter:  2.6 cm  Maximum TRV  Diameter: 2.3 cm  No dilation of the common iliac arteries.  IMPRESSION: Normal caliber abdominal aorta.   Electronically Signed   By: Amie Portland M.D.   On: 10/28/2013 11:49    ASSESSMENT / PLAN: Acute on chronic dyspnea. In setting of patchy bilateral pulmonary infiltrates and probable AECOPD (no PFTs  but active smoker and cxr suggestive). His presentation could be c/w CAP or influenza. He also has chronic cleft palate from a traumatic injury as a child. Wonder about aspiration risk in this setting. Alternatively infiltrates could simply be edema.   Plan: Send resp viral panel Send urine strep Send PCT  Continue CAP coverage Will change symbicort to scheduled NEB for now Add systemic steroid taper Repeat CXR PFTs as out-pt Consider SLP eval  Flutter valve Smoking cessation  Demand ischemia:  >due to above Plan Cont medical rx  Chronic chest pain Plan: Supportive rx   Caryl Bis  [phone]  Cell  [phone]  If no response or cell goes to voicemail, call beeper [phone]  Pulmonary and Critical Care Medicine [facility] Pager: [phone]  10/29/2013, 11:03 AM

## 2013-10-30 ENCOUNTER — Inpatient Hospital Stay (HOSPITAL_COMMUNITY): Payer: Medicare Other

## 2013-10-30 DIAGNOSIS — J441 Chronic obstructive pulmonary disease with (acute) exacerbation: Principal | ICD-10-CM

## 2013-10-30 DIAGNOSIS — D709 Neutropenia, unspecified: Secondary | ICD-10-CM

## 2013-10-30 DIAGNOSIS — J189 Pneumonia, unspecified organism: Secondary | ICD-10-CM

## 2013-10-30 LAB — GLUCOSE, CAPILLARY
Glucose-Capillary: 461 mg/dL — ABNORMAL HIGH (ref 70–99)
Glucose-Capillary: 360 mg/dL — ABNORMAL HIGH (ref 70–99)

## 2013-10-30 LAB — CBC
MCH: 29.2 pg (ref 26.0–34.0)
MCHC: 32.9 g/dL (ref 30.0–36.0)
Platelets: 185 10*3/uL (ref 150–400)
RDW: 15.4 % (ref 11.5–15.5)

## 2013-10-30 MED ORDER — GUAIFENESIN ER 600 MG PO TB12
1200.0000 mg | ORAL_TABLET | Freq: Two times a day (BID) | ORAL | Status: DC
  Administered 2013-10-30 – 2013-11-02 (×7): 1200 mg via ORAL
  Filled 2013-10-30 (×8): qty 2

## 2013-10-30 MED ORDER — BENZONATATE 100 MG PO CAPS
100.0000 mg | ORAL_CAPSULE | Freq: Three times a day (TID) | ORAL | Status: DC | PRN
  Administered 2013-10-30 – 2013-10-31 (×3): 100 mg via ORAL
  Filled 2013-10-30 (×3): qty 1

## 2013-10-30 MED ORDER — LEVOFLOXACIN 750 MG PO TABS
750.0000 mg | ORAL_TABLET | Freq: Every day | ORAL | Status: DC
  Administered 2013-10-30 – 2013-11-02 (×4): 750 mg via ORAL
  Filled 2013-10-30 (×4): qty 1

## 2013-10-30 MED ORDER — INSULIN ASPART 100 UNIT/ML ~~LOC~~ SOLN
4.0000 [IU] | Freq: Three times a day (TID) | SUBCUTANEOUS | Status: DC
  Administered 2013-10-30 – 2013-11-02 (×8): 4 [IU] via SUBCUTANEOUS

## 2013-10-30 MED ORDER — INSULIN ASPART 100 UNIT/ML ~~LOC~~ SOLN
0.0000 [IU] | Freq: Three times a day (TID) | SUBCUTANEOUS | Status: DC
  Administered 2013-10-30: 20 [IU] via SUBCUTANEOUS
  Administered 2013-10-31: 3 [IU] via SUBCUTANEOUS
  Administered 2013-10-31: 7 [IU] via SUBCUTANEOUS
  Administered 2013-10-31: 15 [IU] via SUBCUTANEOUS
  Administered 2013-11-01 (×2): 7 [IU] via SUBCUTANEOUS
  Administered 2013-11-01 – 2013-11-02 (×2): 4 [IU] via SUBCUTANEOUS

## 2013-10-30 NOTE — Consult Note (Signed)
PULMONARY PROGRESS NOTE   Name: Stephen [name] MRN: [id number] DOB: [birth year]    ADMISSION DATE:  10/27/2013 CONSULTATION DATE:  12/26  REFERRING MD :  Clifton James  PRIMARY SERVICE:  Cardiology   CHIEF COMPLAINT:  eval for COPD   BRIEF PATIENT DESCRIPTION:  72 year old male active smoker w/ known CAD and prior CABG, transported to [facility] on 12/24 for cardiac cath after troponin bump after admission to [facility] for presentation c/w URI/ AECOPD. Underwent left heart cath 12/26 showing patent bypass vessels. Felt trop rise was due to demand ischemia. PCCM asked to see for COPD.   SIGNIFICANT EVENTS / STUDIES:  12/26 left heart cath: . Severe triple vessel CAD s/p CABG with 3/3 patent bypass grafts. 2. Moderate LV systolic dysfunction 3. NSTEMI secondary to demand ischemia in setting of COPD exacerbation with severe underlying   LINES / TUBES: PIV  CULTURES: Urine strep 12/26>> PCT 12/26>>> resp viral study 12/26>>>  ANTIBIOTICS: levaquin 12/26>>> tamiflu 12/26>>>   HISTORY OF PRESENT ILLNESS:    72 y/o male with multiple medical problems including CAD s/p MV stenting followed by CABG 2011, COPD with ongoing tobacco use, DM2 and PAD. Transferred from [facility] for NSTEMI. He is s/p stenting LAD and RCA in 2007. In 2011 presented with [place]. Cath with in-stent stenosis of the LAD with three-vessel disease. He underwent left IMA grafting to his LAD and vein grafts to the ramus intermediate and posterolateral branch of the right coronary which were small diabetic vessels. His EF was 30%. Reported since his  CABG he has had chronic pain and clicking in his chest as well as an incisional hernia at the base of his scar. It is unclear how much this has troubled him.   According to the records from [place] he presented to the ER with URI symptoms several days ago and was sent home. He presented to ER again on 12/23 with worsening dyspnea and CP. Admitted for presumptive COPD flare. Treated  with levofloxacin, ceftriaxone, steroids and nebs. However troponin bumped from 0.03->0.04->0.61 so was started on heparin and transferred here for further evaluation. ECG with iLBBB without acute ST-T changes.  On ROS, endorses significant claudication. No bleeding. Continues to smoke 1.5 ppd. Has been depressed recently as his wife went to live with her daughter due to illness. Had cardiac cath the am of 12/26 which showed: all if his bypass grafts to be patent, and moderate LV dysfunction and there for the elevated troponin ct was felt to be explained by demand ischemia. PCCM was asked to see in consult s/p cath to assist with management of his COPD.   Scheduled MEDS:  . albuterol  2.5 mg Nebulization QID  . antiseptic oral rinse  15 mL Mouth Rinse BID  . aspirin  81 mg Oral Daily  . atorvastatin  40 mg Oral q1800  . clopidogrel  75 mg Oral Q breakfast  . fenofibrate  160 mg Oral Daily  . glipiZIDE  2.5 mg Oral BID  . insulin aspart  0-15 Units Subcutaneous TID WC  . insulin glargine  30 Units Subcutaneous QHS  . ipratropium  0.5 mg Nebulization QID  . levofloxacin (LEVAQUIN) IV  750 mg Intravenous Q24H  . lipase/protease/amylase  2-3 capsule Oral TID AC  . lisinopril  20 mg Oral Daily  . methylPREDNISolone (SOLU-MEDROL) injection  40 mg Intravenous Q12H  . oseltamivir  75 mg Oral BID  . pantoprazole  40 mg Oral Daily  . sodium chloride  3 mL Intravenous Q12H  . spironolactone  25 mg Oral Daily   No Known Allergies   SUBJECTIVE:  Feeling better  Has a non prod cough  VITAL SIGNS: Temp:  [97.6 F (36.4 C)-97.8 F (36.6 C)] 97.7 F (36.5 C) (12/27 0541) Pulse Rate:  [70-93] 70 (12/27 0541) Resp:  [16-18] 18 (12/27 0541) BP: (108-171)/(39-68) 161/68 mmHg (12/27 0541) SpO2:  [93 %-98 %] 93 % (12/27 0541) Weight:  [75.524 kg (166 lb 8 oz)] 75.524 kg (166 lb 8 oz) (12/27 0541)  PHYSICAL EXAMINATION: General:  Chronically ill appearing male  Neuro:  Awake, oriented no focal def   HEENT:  Chronic cleft palate  Cardiovascular:  rrr Lungs:  Scattered wheeze and rhonchi. Improve w/ cough. Some element of upper airway noises. Crackles in bases Abdomen:  Soft, non-tender  Musculoskeletal:  Intact  Skin:  Intact    Recent Labs Lab 10/28/13 0609  NA 137  K 3.7  CL 99  CO2 29  BUN 18  CREATININE 1.11  GLUCOSE 206*    Recent Labs Lab 10/28/13 0609 10/29/13 0540 10/30/13 0515  HGB 11.3* 10.2* 11.1*  HCT 33.8* 31.1* 33.7*  WBC 6.4 3.3* 1.6*  PLT 199 171 185   US Aorta  10/28/2013   CLINICAL DATA:  History of peripheral vascular disease. Evaluate for possible aortic aneurysm.  EXAM: ULTRASOUND OF ABDOMINAL AORTA  TECHNIQUE: Ultrasound examination of the abdominal aorta was performed to evaluate for abdominal aortic aneurysm.  COMPARISON:  None.  FINDINGS: Abdominal Aorta  No aneurysm identified. There is diffuse atherosclerotic plaque with vascular calcifications. No significant stenosis is evident.  Maximum AP  Diameter:  2.6 cm  Maximum TRV  Diameter: 2.3 cm  No dilation of the common iliac arteries.  IMPRESSION: Normal caliber abdominal aorta.   Electronically Signed   By: Amie Portland M.D.   On: 10/28/2013 11:49   Dg Chest Port 1 View  10/29/2013   CLINICAL DATA:  72 year old male with weakness. Initial encounter.  EXAM: PORTABLE CHEST - 1 VIEW  COMPARISON:  10/27/2013 and earlier.  FINDINGS: Portable AP semi upright view at 1216 hrs. No significant change in patchy right middle lobe opacity, but interval mildly increased patchy left lung base opacity. No pneumothorax or pulmonary edema. No pleural effusion. Stable cardiac size and mediastinal contours. Sequelae of CABG.  IMPRESSION: Stable patchy opacity at the right lung base with mildly increased left base opacity. Favor acute bilateral respiratory infection.   Electronically Signed   By: Augusto Gamble M.D.   On: 10/29/2013 12:24   CXR 12/27:  COPD w/ patchy incr markings, NAD...    ASSESSMENT /  PLAN:  Acute on chronic dyspnea. In setting of patchy bilateral pulmonary infiltrates and probable AECOPD (no PFTs but active smoker-95 pack yr hx, and cxr suggestive). His presentation could be c/w CAP or influenza. He also has chronic cleft palate from a traumatic injury as a child. Wonder about aspiration risk in this setting. Alternatively infiltrates could simply be edema.   Plan: Send resp viral panel => in process Send urine strep => neg Send PCT => <0.10 Continue CAP coverage Will change Symbicort to scheduled NEB for now Add systemic steroid taper => watch BS Repeat CXR => no new infiltrates PFTs as out-pt Consider SLP eval  Flutter valve Smoking cessation 12/27- add mucinex   Demand ischemia:  >due to above Plan Cont medical rx   Chronic chest pain Plan: Supportive rx    Neutropenia:  WBC is  down 6.4K to 3.3K to 1.6K Check diff, may need Heme...   Protein-Calorie Malnutrition: Serum Alb= 2.0   NADEL,SCOTT MMD Pulmonary and Critical Care Medicine [facility] Pager: [phone] 10/30/2013, 7:58 AM

## 2013-10-30 NOTE — Progress Notes (Signed)
Patient's dinnertime CBG was 461.  Ward Givens, NP notified.  New orders received.  Will continue to monitor. Davenport, Mitzi Hansen

## 2013-10-30 NOTE — Progress Notes (Addendum)
Subjective:  In bed, no new complaints. URI, COPD.  Cath reviewed --med mgt. CABG patent.   Objective:  Vital Signs in the last 24 hours: Temp:  [97.6 F (36.4 C)-97.8 F (36.6 C)] 97.7 F (36.5 C) (12/27 0541) Pulse Rate:  [70-93] 79 (12/27 1027) Resp:  [16-18] 18 (12/27 0541) BP: (141-171)/(51-68) 142/62 mmHg (12/27 1027) SpO2:  [93 %-98 %] 96 % (12/27 0944) Weight:  [166 lb 8 oz (75.524 kg)] 166 lb 8 oz (75.524 kg) (12/27 0541)  Intake/Output from previous day: 12/26 0701 - 12/27 0700 In: 630 [P.O.:480; IV Piggyback:150] Out: 3425 [Urine:3425]   Physical Exam: General: Mildly ill appearing, in no acute distress. Head:  Normocephalic and atraumatic. Cleft palate Lungs: Scattered wheeze B. Heart: Normal S1 and S2.  No murmur, rubs or gallops.  Abdomen: soft, non-tender, positive bowel sounds. Extremities: No clubbing or cyanosis. No edema. Neurologic: Alert and oriented x 3.    Lab Results:  Recent Labs  10/29/13 0540 10/30/13 0515  WBC 3.3* 1.6*  HGB 10.2* 11.1*  PLT 171 185    Recent Labs  10/28/13 0609  NA 137  K 3.7  CL 99  CO2 29  GLUCOSE 206*  BUN 18  CREATININE 1.11    Recent Labs  10/27/13 2206 10/28/13 0609  TROPONINI 0.79* 0.74*   Hepatic Function Panel  Recent Labs  10/28/13 0609  PROT 5.8*  ALBUMIN 2.0*  AST 17  ALT 9  ALKPHOS 56  BILITOT 0.1*    Recent Labs  10/28/13 0609  CHOL 169   No results found for this basename: PROTIME,  in the last 72 hours  Imaging: Dg Chest 2 View  10/30/2013   CLINICAL DATA:  COPD exacerbation  EXAM: CHEST  2 VIEW  COMPARISON:  October 29, 2013  FINDINGS: There is underlying emphysema. There is a small area of patchy infiltrate in the right base. Elsewhere lungs are clear. The heart size is normal. Pulmonary vascularity reflects underlying emphysema. No adenopathy. Patient is status post coronary artery bypass grafting. There is extensive native coronary artery calcification. No bone  lesions.  IMPRESSION: Underlying emphysema. Small area of infiltrate right base. Elsewhere lungs clear.   Electronically Signed   By: Bretta Bang M.D.   On: 10/30/2013 08:02   US Aorta  10/28/2013   CLINICAL DATA:  History of peripheral vascular disease. Evaluate for possible aortic aneurysm.  EXAM: ULTRASOUND OF ABDOMINAL AORTA  TECHNIQUE: Ultrasound examination of the abdominal aorta was performed to evaluate for abdominal aortic aneurysm.  COMPARISON:  None.  FINDINGS: Abdominal Aorta  No aneurysm identified. There is diffuse atherosclerotic plaque with vascular calcifications. No significant stenosis is evident.  Maximum AP  Diameter:  2.6 cm  Maximum TRV  Diameter: 2.3 cm  No dilation of the common iliac arteries.  IMPRESSION: Normal caliber abdominal aorta.   Electronically Signed   By: Amie Portland M.D.   On: 10/28/2013 11:49   Dg Chest Port 1 View  10/29/2013   CLINICAL DATA:  72 year old male with weakness. Initial encounter.  EXAM: PORTABLE CHEST - 1 VIEW  COMPARISON:  10/27/2013 and earlier.  FINDINGS: Portable AP semi upright view at 1216 hrs. No significant change in patchy right middle lobe opacity, but interval mildly increased patchy left lung base opacity. No pneumothorax or pulmonary edema. No pleural effusion. Stable cardiac size and mediastinal contours. Sequelae of CABG.  IMPRESSION: Stable patchy opacity at the right lung base with mildly increased left base opacity. Favor  acute bilateral respiratory infection.   Electronically Signed   By: Augusto Gamble M.D.   On: 10/29/2013 12:24   Personally viewed.   Telemetry: No adverse rhythms Personally viewed.    Cardiac Studies:  Cath patent CABG ECHO: - Left ventricle: The cavity size was mildly dilated. Systolic function was mildly reduced. The estimated ejection fraction was in the range of 45% to 50%. There is akinesis of the basalinferior myocardium. Doppler parameters are consistent with abnormal left  ventricular relaxation (grade 1 diastolic dysfunction). - Mitral valve: Mild regurgitation. - Left atrium: The atrium was mildly dilated.    Assessment/Plan:  Principal Problem:   CAP (community acquired pneumonia) Active Problems:   CARDIOMYOPATHY, ISCHEMIC   CLAUDICATION   COPD   HTN (hypertension)   Chronic systolic congestive heart failure   Secondary DM with complication   NSTEMI (non-ST elevated myocardial infarction)   COPD exacerbation   1) NSTEMI - demand ischemia, Type 2. Cath with patent grafts. Med mgt. Trop trending down. EF 50%. Aldactone, ACE-I.   2) COPD - CAP or influenza. Reviewed pulmonary note. Viral panel pending. Droplet.   3) Tob use - cessation.   4) CAD - no change on cath. Stable.  5) Neutopenia - WBC now 1.6 from 3.3. Concerning.   Appreciate pulm assistance.   SKAINS, MARK 10/30/2013, 11:26 AM

## 2013-10-31 LAB — CBC WITH DIFFERENTIAL/PLATELET
Basophils Absolute: 0 10*3/uL (ref 0.0–0.1)
Basophils Relative: 0 % (ref 0–1)
Eosinophils Absolute: 0 10*3/uL (ref 0.0–0.7)
HCT: 33.7 % — ABNORMAL LOW (ref 39.0–52.0)
Hemoglobin: 11 g/dL — ABNORMAL LOW (ref 13.0–17.0)
Lymphocytes Relative: 12 % (ref 12–46)
MCV: 88.2 fL (ref 78.0–100.0)
Monocytes Relative: 11 % (ref 3–12)
Neutro Abs: 2.5 10*3/uL (ref 1.7–7.7)
RBC: 3.82 MIL/uL — ABNORMAL LOW (ref 4.22–5.81)
RDW: 15.3 % (ref 11.5–15.5)
WBC: 3.3 10*3/uL — ABNORMAL LOW (ref 4.0–10.5)

## 2013-10-31 LAB — RESPIRATORY VIRUS PANEL
Influenza A H1: NOT DETECTED
Influenza A H3: NOT DETECTED
Influenza A: NOT DETECTED
Influenza B: NOT DETECTED
Respiratory Syncytial Virus A: NOT DETECTED
Respiratory Syncytial Virus B: NOT DETECTED

## 2013-10-31 LAB — GLUCOSE, CAPILLARY
Glucose-Capillary: 301 mg/dL — ABNORMAL HIGH (ref 70–99)
Glucose-Capillary: 140 mg/dL — ABNORMAL HIGH (ref 70–99)
Glucose-Capillary: 196 mg/dL — ABNORMAL HIGH (ref 70–99)

## 2013-10-31 LAB — BASIC METABOLIC PANEL
BUN: 22 mg/dL (ref 6–23)
Calcium: 8.4 mg/dL (ref 8.4–10.5)
Chloride: 95 mEq/L — ABNORMAL LOW (ref 96–112)
GFR calc Af Amer: 80 mL/min — ABNORMAL LOW (ref >90)
GFR calc non Af Amer: 69 mL/min — ABNORMAL LOW (ref >90)
Potassium: 4.1 mEq/L (ref 3.5–5.1)
Sodium: 132 mEq/L — ABNORMAL LOW (ref 135–145)

## 2013-10-31 LAB — PROCALCITONIN: Procalcitonin: <0.1 ng/mL

## 2013-10-31 MED ORDER — METOPROLOL TARTRATE 25 MG PO TABS
25.0000 mg | ORAL_TABLET | Freq: Two times a day (BID) | ORAL | Status: DC
  Administered 2013-10-31 – 2013-11-02 (×5): 25 mg via ORAL
  Filled 2013-10-31 (×6): qty 1

## 2013-10-31 NOTE — Progress Notes (Signed)
Subjective:  In bed, no new complaints. URI, COPD.  Cath reviewed --med mgt. CABG patent.   Objective:  Vital Signs in the last 24 hours: Temp:  [98 F (36.7 C)-98.2 F (36.8 C)] 98 F (36.7 C) (12/28 0500) Pulse Rate:  [70-86] 70 (12/28 0500) Resp:  [18-20] 18 (12/28 0500) BP: (143-168)/(63-77) 168/77 mmHg (12/28 0500) SpO2:  [95 %-97 %] 97 % (12/28 0732) FiO2 (%):  [21 %] 21 % (12/28 0732) Weight:  [169 lb 1.6 oz (76.703 kg)] 169 lb 1.6 oz (76.703 kg) (12/28 0500)  Intake/Output from previous day: 12/27 0701 - 12/28 0700 In: 1320 [P.O.:1320] Out: 3825 [Urine:3825]   Physical Exam: General: Mildly ill appearing, in no acute distress. Head:  Normocephalic and atraumatic. Cleft palate Lungs: Scattered wheeze B. Heart: Normal S1 and S2.  No murmur, rubs or gallops.  Abdomen: soft, non-tender, positive bowel sounds. Extremities: No clubbing or cyanosis. No edema. Neurologic: Alert and oriented x 3.    Lab Results:  Recent Labs  10/30/13 0515 10/31/13 0320  WBC 1.6* 3.3*  HGB 11.1* 11.0*  PLT 185 193    Recent Labs  10/31/13 0320  NA 132*  K 4.1  CL 95*  CO2 30  GLUCOSE 373*  BUN 22  CREATININE 1.06   No results found for this basename: TROPONINI, CK, MB,  in the last 72 hours Hepatic Function Panel No results found for this basename: PROT, ALBUMIN, AST, ALT, ALKPHOS, BILITOT, BILIDIR, IBILI,  in the last 72 hours No results found for this basename: CHOL,  in the last 72 hours No results found for this basename: PROTIME,  in the last 72 hours  Imaging: Dg Chest 2 View  10/30/2013   CLINICAL DATA:  COPD exacerbation  EXAM: CHEST  2 VIEW  COMPARISON:  October 29, 2013  FINDINGS: There is underlying emphysema. There is a small area of patchy infiltrate in the right base. Elsewhere lungs are clear. The heart size is normal. Pulmonary vascularity reflects underlying emphysema. No adenopathy. Patient is status post coronary artery bypass grafting. There is  extensive native coronary artery calcification. No bone lesions.  IMPRESSION: Underlying emphysema. Small area of infiltrate right base. Elsewhere lungs clear.   Electronically Signed   By: Bretta Bang M.D.   On: 10/30/2013 08:02   Dg Chest Port 1 View  10/29/2013   CLINICAL DATA:  72 year old male with weakness. Initial encounter.  EXAM: PORTABLE CHEST - 1 VIEW  COMPARISON:  10/27/2013 and earlier.  FINDINGS: Portable AP semi upright view at 1216 hrs. No significant change in patchy right middle lobe opacity, but interval mildly increased patchy left lung base opacity. No pneumothorax or pulmonary edema. No pleural effusion. Stable cardiac size and mediastinal contours. Sequelae of CABG.  IMPRESSION: Stable patchy opacity at the right lung base with mildly increased left base opacity. Favor acute bilateral respiratory infection.   Electronically Signed   By: Augusto Gamble M.D.   On: 10/29/2013 12:24   Personally viewed.   Telemetry: No adverse rhythms Personally viewed.    Cardiac Studies:  Cath patent CABG ECHO: - Left ventricle: The cavity size was mildly dilated. Systolic function was mildly reduced. The estimated ejection fraction was in the range of 45% to 50%. There is akinesis of the basalinferior myocardium. Doppler parameters are consistent with abnormal left ventricular relaxation (grade 1 diastolic dysfunction). - Mitral valve: Mild regurgitation. - Left atrium: The atrium was mildly dilated.    Assessment/Plan:  Principal Problem:  CAP (community acquired pneumonia) Active Problems:   CARDIOMYOPATHY, ISCHEMIC   CLAUDICATION   COPD   HTN (hypertension)   Chronic systolic congestive heart failure   Secondary DM with complication   NSTEMI (non-ST elevated myocardial infarction)   COPD exacerbation   1) NSTEMI - demand ischemia, Type 2. Cath with patent grafts. Med mgt. Trop trending down. EF 50%. Aldactone, ACE-I.   2) COPD - CAP or influenza. Reviewed pulmonary  note. Viral panel pending. Droplet.   3) Tob use - cessation.   4) CAD - no change on cath. Stable.  5) Neutopenia - WBC now 1.6 from 3.3. Concerning.   6. HTN  ;  He refused some / all of his meds this am.  He was on metoprolol at home.  Will add back      Appreciate pulm assistance.   Elyn Aquas. 10/31/2013, 10:59 AM

## 2013-10-31 NOTE — Progress Notes (Signed)
Pt refusing to take any of his medicine. Pt states that he does not have the flu and that he does not want any medicine and that he wants to leave the hospital. Ward Givens, NP paged and made aware.

## 2013-11-01 LAB — GLUCOSE, CAPILLARY
Glucose-Capillary: 170 mg/dL — ABNORMAL HIGH (ref 70–99)
Glucose-Capillary: 208 mg/dL — ABNORMAL HIGH (ref 70–99)

## 2013-11-01 NOTE — Progress Notes (Signed)
Subjective:  In bed, no new complaints. COPD exacerbation.  Cath reviewed --med mgt. CABG patent.   [name] albuterol  2.5 mg Nebulization QID  . antiseptic oral rinse  15 mL Mouth Rinse BID  . aspirin  81 mg Oral Daily  . atorvastatin  40 mg Oral q1800  . clopidogrel  75 mg Oral Q breakfast  . fenofibrate  160 mg Oral Daily  . glipiZIDE  2.5 mg Oral BID  . guaiFENesin  1,200 mg Oral BID  . insulin aspart  0-20 Units Subcutaneous TID WC  . insulin aspart  4 Units Subcutaneous TID WC  . insulin glargine  30 Units Subcutaneous QHS  . ipratropium  0.5 mg Nebulization QID  . levofloxacin  750 mg Oral Daily  . lipase/protease/amylase  2-3 capsule Oral TID AC  . lisinopril  20 mg Oral Daily  . methylPREDNISolone (SOLU-MEDROL) injection  40 mg Intravenous Q12H  . metoprolol tartrate  25 mg Oral BID  . oseltamivir  75 mg Oral BID  . pantoprazole  40 mg Oral Daily  . sodium chloride  3 mL Intravenous Q12H  . spironolactone  25 mg Oral Daily   Objective:  Vital Signs in the last 24 hours: Temp:  [97.7 F (36.5 C)-98.5 F (36.9 C)] 97.9 F (36.6 C) (12/29 0620) Pulse Rate:  [62-80] 62 (12/29 0620) Resp:  [18-20] 18 (12/29 0620) BP: (149-182)/(65-87) 173/65 mmHg (12/29 0620) SpO2:  [97 %-100 %] 98 % (12/29 0620) FiO2 (%):  [21 %] 21 % (12/28 1531) Weight:  [168 lb 10.4 oz (76.5 kg)] 168 lb 10.4 oz (76.5 kg) (12/29 0620)  Intake/Output from previous day: 12/28 0701 - 12/29 0700 In: 1080 [P.O.:1080] Out: 3775 [Urine:3775]  Physical Exam: General: Mildly ill appearing, in no acute distress. Head:  Normocephalic and atraumatic. Cleft palate Lungs: Wheezes B/L. Heart: Normal S1 and S2.  No murmur, rubs or gallops.  Abdomen: soft, non-tender, positive bowel sounds. Extremities: No clubbing or cyanosis. No edema. Neurologic: Alert and oriented x 3.   Lab Results:  Recent Labs  10/30/13 0515 10/31/13 0320  WBC 1.6* 3.3*  HGB 11.1* 11.0*  PLT 185 193    Recent Labs  10/31/13 0320  NA 132*  K 4.1  CL 95*  CO2 30  GLUCOSE 373*  BUN 22  CREATININE 1.06   Telemetry: No adverse rhythms Personally viewed.   Cardiac Studies:  Cath patent CABG ECHO: - Left ventricle: The cavity size was mildly dilated. Systolic function was mildly reduced. The estimated ejection fraction was in the range of 45% to 50%. There is akinesis of the basalinferior myocardium. Doppler parameters are consistent with abnormal left ventricular relaxation (grade 1 diastolic dysfunction). - Mitral valve: Mild regurgitation. - Left atrium: The atrium was mildly dilated.  Left cardiac cath 10/29/2013 Left Ventricular Angiogram: LVEF=40%. Global hypokinesis.  Impression:  1. Severe triple vessel CAD s/p CABG with 3/3 patent bypass grafts.  2. Moderate LV systolic dysfunction  3. NSTEMI secondary to demand ischemia in setting of COPD exacerbation with severe underlying CAD  Recommendations: Continue medical management of CAD. Aggressive risk factor reduction including smoking cessation, BP control. He will need continued treatment for his COPD exacerbation. I will ask Pulmonary to see him today to help with management of his COPD exacerbation.    Assessment/Plan:   Principal Problem:   CAP (community acquired pneumonia) Active Problems:   CARDIOMYOPATHY, ISCHEMIC   CLAUDICATION   COPD   HTN (hypertension)   Chronic systolic  congestive heart failure   Secondary DM with complication   NSTEMI (non-ST elevated myocardial infarction)   COPD exacerbation  1. NSTEMI - demand ischemia. Cath with patent grafts. Med mgt. Trop trending down. EF 50%. Aldactone, ACE-I.   2. COPD - CAP or influenza. Reviewed pulmonary note. Viral panel negative.   3. Tob use - cessation.   4. Neutropenia - WBC now 1.6 from 3.3. Concerning.   6. HTN - He refused some / all of his meds yesterday, today hypertensive despite taking his meds. We will add amlodipine 2.5 mg PO daily.   The patient is  requesting to go home, however he is still actively wheezing and is hypertensive. We will keep him at least till tomorrow.    Tobias Alexander, H 11/01/2013, 10:56 AM

## 2013-11-01 NOTE — Progress Notes (Signed)
He would not let me see or examine him. He felt that he 'did not need a lung doctor.' He seemed upset that his discharge had been delayed. I note bronchospasm on cards exam today. Suggest -change to PO prednisone & out pt FU if he is interested. Smoking cessation paramount. Other recommendations as prior PCCM to sign off   Stephen [name],Stephen [name].  [phone]

## 2013-11-01 NOTE — Progress Notes (Signed)
Utilization review completed.  

## 2013-11-01 NOTE — Progress Notes (Signed)
Inpatient Diabetes Program Recommendations  AACE/ADA: New Consensus Statement on Inpatient Glycemic Control (2013)  Target Ranges:  Prepandial:   less than 140 mg/dL      Peak postprandial:   less than 180 mg/dL (1-2 hours)      Critically ill patients:  140 - 180 mg/dL   High fasting glucose levels-hyperglycemia throughout the day.  Inpatient Diabetes Program Recommendations Insulin - Basal: Please increase lantus to 40 units while on solumedrol (pt may need a higher dose while on high dose steroid therapy)  Thank you, Lenor Coffin, RN, CNS, Diabetes Coordinator [phone])

## 2013-11-02 ENCOUNTER — Encounter (HOSPITAL_COMMUNITY): Payer: Self-pay | Admitting: Physician Assistant

## 2013-11-02 LAB — GLUCOSE, CAPILLARY
Glucose-Capillary: 42 mg/dL — CL (ref 70–99)
Glucose-Capillary: 189 mg/dL — ABNORMAL HIGH (ref 70–99)

## 2013-11-02 MED ORDER — IPRATROPIUM BROMIDE 0.02 % IN SOLN: 0.5000 mg | RESPIRATORY_TRACT | Status: DC | PRN

## 2013-11-02 MED ORDER — PANTOPRAZOLE SODIUM 40 MG PO TBEC: 40.0000 mg | DELAYED_RELEASE_TABLET | Freq: Every day | ORAL | Status: DC

## 2013-11-02 MED ORDER — ASPIRIN EC 81 MG PO TBEC: 81.0000 mg | DELAYED_RELEASE_TABLET | Freq: Every day | ORAL | Status: DC

## 2013-11-02 MED ORDER — LEVOFLOXACIN 750 MG PO TABS: 750.0000 mg | ORAL_TABLET | Freq: Every day | ORAL | Status: DC

## 2013-11-02 MED ORDER — ALBUTEROL SULFATE (2.5 MG/3ML) 0.083% IN NEBU: 2.5000 mg | INHALATION_SOLUTION | RESPIRATORY_TRACT | Status: DC | PRN

## 2013-11-02 MED ORDER — IPRATROPIUM-ALBUTEROL 18-103 MCG/ACT IN AERO: 1.0000 | INHALATION_SPRAY | Freq: Four times a day (QID) | RESPIRATORY_TRACT | Status: AC | PRN

## 2013-11-02 MED ORDER — PREDNISONE 10 MG PO TABS: ORAL_TABLET | ORAL | Status: DC

## 2013-11-02 MED ORDER — GUAIFENESIN ER 600 MG PO TB12: 1200.0000 mg | ORAL_TABLET | Freq: Two times a day (BID) | ORAL | Status: DC

## 2013-11-02 MED ORDER — CLOPIDOGREL BISULFATE 75 MG PO TABS: 75.0000 mg | ORAL_TABLET | Freq: Every day | ORAL | Status: DC

## 2013-11-02 MED ORDER — ATORVASTATIN CALCIUM 40 MG PO TABS: 40.0000 mg | ORAL_TABLET | Freq: Every evening | ORAL | Status: DC

## 2013-11-02 NOTE — Discharge Summary (Signed)
Discharge Summary   Patient ID: Stephen [name] MRN: [id number], DOB/AGE: 1941/04/11 72 y.o. Admit date: 10/27/2013 D/C date:     11/02/2013  Primary Care Provider: Lajean Saver, NT Primary Cardiologist: Jonita Albee, will be seeing Dr. Diona Browner in followup  Primary Discharge Diagnoses:  1. CAD/NSTEMI - NSTEMI this admission secondary to demand ischemia in setting of COPD exacerbation with severe underlying CAD - cath with 3/3 patent grafts, for medical therapy - prior history: a. BMS to LAD 1999. b. HSRA to LAD and stent to RCA 2007. c. NSTEMI/CABG x 3 in 04/2010. 2. Community acquired pneumonia vs AECOPD 3. COPD with ongoing tobacco abuse 4. Neutropenia 5. HTN 6. Diabetes mellitus type 2 uncontrolled  - A1c 10.9 - with hyper/hypoglycemia this admission 7. PAD - severely abnormal ABIs, for OP evaluation 8. Sternal pain - nonunion of sternum, chronic, no need for intervention unless becomes painful 9. ICM  - EF 45-50% by echo this admission - previously EF 30% in 2011  Secondary Discharge Diagnoses:  1. H/o pancreatitis 2. Neuropathy 3. Hyperlipdemia 4. GERD 5. S/p colonoscopy hyperplastic polyp, diverticulosis, anal papilla and internal hemorrhoids r July 2003 6. S/p endoscopy Dr. Tish Men: erosive reflux esophagitis , PUD  7. PUD diagnosed via EGD by Dr. Tish Men at [facility]  8. Diverticulosis 9. Hemorrhoids 10. Remote alcoholism 11. 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  Hospital Course: Stephen [name] is a 25 y/o M with history of CAD s/p stenting and later CABG 2011 (EF previously 30%), COPD with ongoing tobacco use, DM2, PAD who presented initally to [facility] on 10/26/13 with complaints of dyspnea. He has a limited education and was an extremely poor historian. He said that since his CABG he has had chronic pain and clicking in his chest as well as an incisional hernia at the base of his scar. It is unclear how much this has troubled him.  According to the records from [place] he presented to the ER with URI symptoms several days ago and was sent home. He presented to ER again on 12/23 with worsening dyspnea and CP. Admitted for presumptive COPD flare. Treated with levofloxacin, ceftriaxone, steroids and nebs. However troponin bumped from 0.03->0.04->0.61 so was started on heparin and transferred here for further evaluation. ECG with iLBBB without acute ST-T changes. He was admitted here for NSTEMI in the setting of URI. He was treated with ASA, heparin, and statin. BB was held temporarily due to COPD with active wheezing. Troponin here peaked at 1.02. 2D echo was obtained showing EF 45-50%, grade 1 d/d, mild MR. Cardiac cath 10/29/13 showed 1. Severe triple vessel CAD s/p CABG with 3/3 patent bypass grafts.  2. Moderate LV systolic dysfunction EF 40% global HK 3. NSTEMI secondary to demand ischemia in setting of COPD exacerbation with severe underlying CAD Medical management was recommended. He was started on Plavix. Prilosec was changed to Protonix due to this. In regards to his sternal pain, Dr. Clifton James noted the patient to have non-union of the sternum bt physical exam which has been chronic. He discussed this with CT surgery and they advised no need for intervention unless it becomes painful.   Pulmonology was consulted for optimization of pulmonary regimen. Strep pneumo was negative, resp viral panel was negative including for flu. PCT <0.10. He was covered for CAP and placed on systemic steroid taper. Supportive rx was recommended along with outpatient PFTs. They followed along during this admission. However, on the 28th the patient began  intermittently refusing medications. On 11/01/13, he would not let Dr. Vassie Loll with PCCM see or examine him. He told Dr. Vassie Loll he did not need a lung doctor. He was changed to oral prednisone and Dr. Vassie Loll recommended outpatient pulm followup if he is interested, with smoking cessation. The patient was  advised to contact their office if he does decide he wants to follow up with them. He will complete two more days of Levaquin. Today he is feeling better. Dr. Eden Emms has seen and examined the patient today and feels he is stable for discharge.  Additional issues for followup:  - CARDIOLOGY: Regarding abnormal ABIs: He did complain of claudication this admission as well. Abd [place] was normal with normal caliber aorta. ABIs were markedly abnormal with 0.48 R and 0.33 on L. Per d/w Dr. Eden Emms, the patient was scheduled to see Dr. Kirke Corin as an outpatient. It was clearly outlined on DC paperwork that this is in the Echo office. - CARDIOLOGY: He was started on statin this adm. Consider outpt f/u labs. - Regarding diabetes: A1C 10.9. CBG fluctuated greatly this admission with both hyper and hypoglycemia this admission. He was continued on home glipizide and Lantus at discharge. CBG was low this AM thus we decided not to resume metformin just yet (40s, asymptomatic, came up with eating). Pt advised to monitor blood sugar regularly throughout the day and contact PCP today to discuss further plan for managing. - Neutopenia noted on labs - advised to f/u PCP.  Discharge Vitals: Blood pressure 156/73, pulse 67, temperature 97.8 F (36.6 C), temperature source Oral, resp. rate 18, height 5\' 10"  (1.778 m), weight 168 lb 3.4 oz (76.3 kg), SpO2 100.00%.  Labs: Lab Results  Component Value Date   WBC 3.3* 10/31/2013   HGB 11.0* 10/31/2013   HCT 33.7* 10/31/2013   MCV 88.2 10/31/2013   PLT 193 10/31/2013     Recent Labs Lab 10/28/13 0609 10/31/13 0320  NA 137 132*  K 3.7 4.1  CL 99 95*  CO2 29 30  BUN 18 22  CREATININE 1.11 1.06  CALCIUM 8.7 8.4  PROT 5.8*  --   BILITOT 0.1*  --   ALKPHOS 56  --   ALT 9  --   AST 17  --   GLUCOSE 206* 373*    Lab Results  Component Value Date   CHOL 169 10/28/2013   HDL 53 10/28/2013   LDLCALC 76 10/28/2013   TRIG 202* 10/28/2013     Diagnostic  Studies/Procedures   ABIs this adm - see above  Cardiac catheterization this admission, please see full report and above for summary.  2D Echo 10/28/13 - Left ventricle: The cavity size was mildly dilated. Systolic function was mildly reduced. The estimated ejection fraction was in the range of 45% to 50%. There is akinesis of the basalinferior myocardium. Doppler parameters are consistent with abnormal left ventricular relaxation (grade 1 diastolic dysfunction). - Mitral valve: Mild regurgitation. - Left atrium: The atrium was mildly dilated.  Dg Chest 2 View 10/30/2013   CLINICAL DATA:  COPD exacerbation  EXAM: CHEST  2 VIEW  COMPARISON:  October 29, 2013  FINDINGS: There is underlying emphysema. There is a small area of patchy infiltrate in the right base. Elsewhere lungs are clear. The heart size is normal. Pulmonary vascularity reflects underlying emphysema. No adenopathy. Patient is status post coronary artery bypass grafting. There is extensive native coronary artery calcification. No bone lesions.  IMPRESSION: Underlying emphysema. Small area of infiltrate right  base. Elsewhere lungs clear.   Electronically Signed   By: Bretta Bang M.D.   On: 10/30/2013 08:02   Dg Chest 2 View 10/27/2013   CLINICAL DATA:  Chest discomfort.  Shortness of breath.  EXAM: CHEST  2 VIEW  COMPARISON:  PA and lateral chest 10/26/2013 and 02/06/2011.  FINDINGS: The lungs appear emphysematous. Patchy basilar airspace disease seen on the prior study persists without marked change. Heart size is normal. The patient is status post CABG. No pneumothorax or pleural effusion.  IMPRESSION: No change in patchy basilar airspace disease since yesterday's examination which could be due to atelectasis or pneumonia.  Emphysema.   Electronically Signed   By: Drusilla Kanner M.D.   On: 10/27/2013 21:27   US Aorta 10/28/2013   CLINICAL DATA:  History of peripheral vascular disease. Evaluate for possible aortic  aneurysm.  EXAM: ULTRASOUND OF ABDOMINAL AORTA  TECHNIQUE: Ultrasound examination of the abdominal aorta was performed to evaluate for abdominal aortic aneurysm.  COMPARISON:  None.  FINDINGS: Abdominal Aorta  No aneurysm identified. There is diffuse atherosclerotic plaque with vascular calcifications. No significant stenosis is evident.  Maximum AP  Diameter:  2.6 cm  Maximum TRV  Diameter: 2.3 cm  No dilation of the common iliac arteries.  IMPRESSION: Normal caliber abdominal aorta.   Electronically Signed   By: Amie Portland M.D.   On: 10/28/2013 11:49   Dg Chest Port 1 View 10/29/2013   CLINICAL DATA:  72 year old male with weakness. Initial encounter.  EXAM: PORTABLE CHEST - 1 VIEW  COMPARISON:  10/27/2013 and earlier.  FINDINGS: Portable AP semi upright view at 1216 hrs. No significant change in patchy right middle lobe opacity, but interval mildly increased patchy left lung base opacity. No pneumothorax or pulmonary edema. No pleural effusion. Stable cardiac size and mediastinal contours. Sequelae of CABG.  IMPRESSION: Stable patchy opacity at the right lung base with mildly increased left base opacity. Favor acute bilateral respiratory infection.   Electronically Signed   By: Augusto Gamble M.D.   On: 10/29/2013 12:24    Discharge Medications     Medication List    STOP taking these medications       metFORMIN 1000 MG tablet  Commonly known as:  GLUCOPHAGE     omeprazole 20 MG capsule  Commonly known as:  PRILOSEC  Replaced by:  pantoprazole 40 MG tablet      TAKE these medications       albuterol-ipratropium 18-103 MCG/ACT inhaler  Commonly known as:  COMBIVENT  Inhale 1-2 puffs into the lungs every 6 (six) hours as needed for wheezing or shortness of breath.     aspirin EC 81 MG tablet  Take 1 tablet (81 mg total) by mouth daily.     atorvastatin 40 MG tablet  Commonly known as:  LIPITOR  Take 1 tablet (40 mg total) by mouth every evening.     clopidogrel 75 MG tablet    Commonly known as:  PLAVIX  Take 1 tablet (75 mg total) by mouth daily.     fenofibrate 160 MG tablet  Take 160 mg by mouth daily.     glipiZIDE 2.5 MG 24 hr tablet  Commonly known as:  GLUCOTROL XL  Take 2.5 mg by mouth 2 (two) times daily.     guaiFENesin 600 MG 12 hr tablet  Commonly known as:  MUCINEX  Take 2 tablets (1,200 mg total) by mouth 2 (two) times daily.     LANTUS SOLOSTAR [place]  Inject 30 Units into the skin daily.     levofloxacin 750 MG tablet  Commonly known as:  LEVAQUIN  Take 1 tablet (750 mg total) by mouth daily.     lisinopril 20 MG tablet  Commonly known as:  PRINIVIL,ZESTRIL  Take 20 mg by mouth daily.     metoprolol tartrate 25 MG tablet  Commonly known as:  LOPRESSOR  Take 25 mg by mouth 2 (two) times daily.     nitroGLYCERIN 0.4 MG SL tablet  Commonly known as:  NITROSTAT  Place 0.4 mg under the tongue every 5 (five) minutes x 3 doses as needed.     Pancrelipase (Lip-Prot-Amyl) 24000 UNITS Cpep  Take 3 capsules (72,000 Units total) by mouth 3 (three) times daily with meals. Take 2 with snacks.     pantoprazole 40 MG tablet  Commonly known as:  PROTONIX  Take 1 tablet (40 mg total) by mouth daily.     predniSONE 10 MG tablet  Commonly known as:  DELTASONE  By mouth: 4 tablets daily for 2 days, then 3 tablets daily for 2 days, then 2 tablets daily for 2 days, then 1 tablet daily for 2 days, then 1/2 tablet daily for 2 days.     spironolactone 25 MG tablet  Commonly known as:  ALDACTONE  Take 25 mg by mouth daily.     SYMBICORT 160-4.5 MCG/ACT inhaler  Generic drug:  budesonide-formoterol  Inhale 2 puffs into the lungs 2 (two) times daily.     VICODIN 5-300 MG Tabs  Generic drug:  Hydrocodone-Acetaminophen  Take 1 tablet by mouth 4 (four) times daily as needed (for pain).        Disposition   The patient will be discharged in stable condition to home. Discharge Orders   Future Appointments Provider Department Dept Phone    11/11/2013 2:40 PM Jonelle Sidle, MD [facility] [phone]   11/16/2013 9:00 AM Iran Ouch, MD [facility] [phone]   Future Orders Complete By Expires   Diet - low sodium heart healthy  As directed    Scheduling Instructions:     Diabetic Diet   Increase activity slowly  As directed    Comments:     No driving for 3 days. No lifting over 10 lbs for 1 week. No sexual activity for 1 week. Keep procedure site clean & dry. If you notice increased pain, swelling, bleeding or pus, call/return!  You may shower, but no soaking baths/hot tubs/pools for 1 week.   Your blood sugars were sometimes too high and too low while in the hospital. Call you primary care doctor today to discuss a plan for monitoring your sugars. Please make sure you are checking them regularly throughout the day. Call your doctor if they are too low or too high.  Your Prilosec (omeprazole) was stopped because it can interact with Plavix - we started you on Protonix instead.     Follow-up Information   Follow up with Nona Dell, MD On 11/11/2013. (at 2:40pm - [place] office)    Specialty:  Cardiology   Contact information:   [address] [place] [address] [phone]       Follow up with Primary Care Provider. (Contact your primary care doctor TODAY to discuss a plan for monitoring your diabetes. Your white blood cell count was also low and needs further workup.)       Follow up with Lorine Bears,  MD On 11/16/2013. (At 9am - to evaluate your peripheral vascular disease/leg pain - [place] office)    Specialty:  Cardiology   Contact information:   [facility]. [address] [place] [address] [phone]       Follow up with Shan Levans, MD. (It has been recommended that you see a lung doctor. If you agree to this, please call their office for an appointment. They also want you to schedule pulmonary function tests to test  how well your lungs work.)    Specialty:  Pulmonary Disease   Contact information:   [address] [place] [address] [phone]         Duration of Discharge Encounter: Greater than 30 minutes including physician and PA time.  Signed, Dayna Dunn PA-C 11/02/2013, 11:11 AM

## 2013-11-02 NOTE — Progress Notes (Signed)
Patients assessment unchanged from this am. D/c'd with  Son via wheelchair to private vehicle in stable condition

## 2013-11-02 NOTE — Progress Notes (Signed)
Patient ID: Stephen [name], male   DOB: 07-10-1941, 72 y.o.   MRN: [id number]    Subjective:  Wants to go home Breathing better   . albuterol  2.5 mg Nebulization QID  . antiseptic oral rinse  15 mL Mouth Rinse BID  . aspirin  81 mg Oral Daily  . atorvastatin  40 mg Oral q1800  . clopidogrel  75 mg Oral Q breakfast  . fenofibrate  160 mg Oral Daily  . glipiZIDE  2.5 mg Oral BID  . guaiFENesin  1,200 mg Oral BID  . insulin aspart  0-20 Units Subcutaneous TID WC  . insulin aspart  4 Units Subcutaneous TID WC  . insulin glargine  30 Units Subcutaneous QHS  . ipratropium  0.5 mg Nebulization QID  . levofloxacin  750 mg Oral Daily  . lipase/protease/amylase  2-3 capsule Oral TID AC  . lisinopril  20 mg Oral Daily  . methylPREDNISolone (SOLU-MEDROL) injection  40 mg Intravenous Q12H  . metoprolol tartrate  25 mg Oral BID  . oseltamivir  75 mg Oral BID  . pantoprazole  40 mg Oral Daily  . sodium chloride  3 mL Intravenous Q12H  . spironolactone  25 mg Oral Daily   Objective:  Vital Signs in the last 24 hours: Temp:  [97.1 F (36.2 C)-98.1 F (36.7 C)] 97.8 F (36.6 C) (12/30 0514) Pulse Rate:  [67-93] 67 (12/30 0514) Resp:  [18-20] 18 (12/30 0514) BP: (141-170)/(57-77) 156/73 mmHg (12/30 0514) SpO2:  [96 %-100 %] 100 % (12/30 0514) Weight:  [168 lb 3.4 oz (76.3 kg)] 168 lb 3.4 oz (76.3 kg) (12/30 0514)  Intake/Output from previous day: 12/29 0701 - 12/30 0700 In: -  Out: 3650 [Urine:3650]  Physical Exam: General: Mildly ill appearing, in no acute distress. Head:  Normocephalic and atraumatic. Cleft palate Lungs: Wheezes B/L. Heart: Normal S1 and S2.  No murmur, rubs or gallops.  Abdomen: soft, non-tender, positive bowel sounds. Extremities: No clubbing or cyanosis. No edema. Neurologic: Alert and oriented x 3.   Lab Results:  Recent Labs  10/31/13 0320  WBC 3.3*  HGB 11.0*  PLT 193    Recent Labs  10/31/13 0320  NA 132*  K 4.1  CL 95*  CO2 30  GLUCOSE  373*  BUN 22  CREATININE 1.06   Telemetry: No adverse rhythms Personally viewed.   Cardiac Studies:  Cath patent CABG ECHO: - Left ventricle: The cavity size was mildly dilated. Systolic function was mildly reduced. The estimated ejection fraction was in the range of 45% to 50%. There is akinesis of the basalinferior myocardium. Doppler parameters are consistent with abnormal left ventricular relaxation (grade 1 diastolic dysfunction). - Mitral valve: Mild regurgitation. - Left atrium: The atrium was mildly dilated.  Left cardiac cath 10/29/2013 Left Ventricular Angiogram: LVEF=40%. Global hypokinesis.  Impression:  1. Severe triple vessel CAD s/p CABG with 3/3 patent bypass grafts.  2. Moderate LV systolic dysfunction  3. NSTEMI secondary to demand ischemia in setting of COPD exacerbation with severe underlying CAD  Recommendations: Continue medical management of CAD. Aggressive risk factor reduction including smoking cessation, BP control. He will need continued treatment for his COPD exacerbation. I will ask Pulmonary to see him today to help with management of his COPD exacerbation.    Assessment/Plan:   Principal Problem:   CAP (community acquired pneumonia) Active Problems:   CARDIOMYOPATHY, ISCHEMIC   CLAUDICATION   COPD   HTN (hypertension)   Chronic systolic congestive heart failure  Secondary DM with complication   NSTEMI (non-ST elevated myocardial infarction)   COPD exacerbation  1. NSTEMI - demand ischemia. Cath with patent grafts. Med mgt. . EF 50%. Aldactone, ACE-I.   2. COPD - CAP or influenza. Reviewed pulmonary note. Viral panel negative.   3. Tob use - cessation.   4. Neutropenia - WBC 3.3  Outpatient f/u primary   6. HTN - Improved with medical compliance  D/C home today Outpatient f/u in [facility] 11/02/2013, 7:50 AM

## 2013-11-05 ENCOUNTER — Encounter: Payer: Medicare Other | Admitting: Cardiology

## 2013-11-10 ENCOUNTER — Telehealth: Payer: Self-pay | Admitting: *Deleted

## 2013-11-10 NOTE — Telephone Encounter (Signed)
Called patient to remind him of his appointment tomorrow and he stated " I ain't coming back down there no more and cancel my appointment". Patient informed nurse that he requested pain meds in the past for his chest pain and the doctor wouldn't give him any. Nurse advised patient that he's never seen the cardiologist he's supposed to see on tomorrow in the past. Patient informed nurse that he saw [facility]. Nurse advised patient that he is no longer with our practice. Patient said "well, I still ain't coming so cancel my appointment".

## 2013-11-10 NOTE — Telephone Encounter (Signed)
Noted. I will inform PCP.

## 2013-11-11 ENCOUNTER — Encounter: Payer: Medicare Other | Admitting: Cardiology

## 2013-11-16 ENCOUNTER — Institutional Professional Consult (permissible substitution): Payer: Medicare Other | Admitting: Cardiovascular Disease

## 2014-03-13 ENCOUNTER — Encounter (HOSPITAL_COMMUNITY): Payer: Self-pay | Admitting: Emergency Medicine

## 2014-03-13 ENCOUNTER — Emergency Department (HOSPITAL_COMMUNITY): Payer: Medicare Other

## 2014-03-13 ENCOUNTER — Emergency Department (HOSPITAL_COMMUNITY)
Admission: EM | Admit: 2014-03-13 | Discharge: 2014-03-13 | Disposition: A | Discharge status: Home / Self Care | Payer: Medicare Other | Attending: Emergency Medicine | Admitting: Emergency Medicine

## 2014-03-13 DIAGNOSIS — K219 Gastro-esophageal reflux disease without esophagitis: Secondary | ICD-10-CM | POA: Insufficient documentation

## 2014-03-13 DIAGNOSIS — G8929 Other chronic pain: Secondary | ICD-10-CM | POA: Insufficient documentation

## 2014-03-13 DIAGNOSIS — Z7902 Long term (current) use of antithrombotics/antiplatelets: Secondary | ICD-10-CM | POA: Insufficient documentation

## 2014-03-13 DIAGNOSIS — J4489 Other specified chronic obstructive pulmonary disease: Secondary | ICD-10-CM | POA: Insufficient documentation

## 2014-03-13 DIAGNOSIS — W19XXXA Unspecified fall, initial encounter: Secondary | ICD-10-CM

## 2014-03-13 DIAGNOSIS — Y9389 Activity, other specified: Secondary | ICD-10-CM | POA: Insufficient documentation

## 2014-03-13 DIAGNOSIS — G608 Other hereditary and idiopathic neuropathies: Secondary | ICD-10-CM | POA: Insufficient documentation

## 2014-03-13 DIAGNOSIS — E119 Type 2 diabetes mellitus without complications: Secondary | ICD-10-CM | POA: Insufficient documentation

## 2014-03-13 DIAGNOSIS — I739 Peripheral vascular disease, unspecified: Secondary | ICD-10-CM | POA: Insufficient documentation

## 2014-03-13 DIAGNOSIS — E785 Hyperlipidemia, unspecified: Secondary | ICD-10-CM | POA: Insufficient documentation

## 2014-03-13 DIAGNOSIS — Y929 Unspecified place or not applicable: Secondary | ICD-10-CM | POA: Insufficient documentation

## 2014-03-13 DIAGNOSIS — I10 Essential (primary) hypertension: Secondary | ICD-10-CM | POA: Insufficient documentation

## 2014-03-13 DIAGNOSIS — R296 Repeated falls: Secondary | ICD-10-CM | POA: Insufficient documentation

## 2014-03-13 DIAGNOSIS — F1021 Alcohol dependence, in remission: Secondary | ICD-10-CM | POA: Insufficient documentation

## 2014-03-13 DIAGNOSIS — Z79899 Other long term (current) drug therapy: Secondary | ICD-10-CM | POA: Insufficient documentation

## 2014-03-13 DIAGNOSIS — I2589 Other forms of chronic ischemic heart disease: Secondary | ICD-10-CM | POA: Insufficient documentation

## 2014-03-13 DIAGNOSIS — F17200 Nicotine dependence, unspecified, uncomplicated: Secondary | ICD-10-CM | POA: Insufficient documentation

## 2014-03-13 DIAGNOSIS — IMO0002 Reserved for concepts with insufficient information to code with codable children: Secondary | ICD-10-CM | POA: Insufficient documentation

## 2014-03-13 DIAGNOSIS — M549 Dorsalgia, unspecified: Secondary | ICD-10-CM

## 2014-03-13 DIAGNOSIS — Z7982 Long term (current) use of aspirin: Secondary | ICD-10-CM | POA: Insufficient documentation

## 2014-03-13 DIAGNOSIS — J449 Chronic obstructive pulmonary disease, unspecified: Secondary | ICD-10-CM | POA: Insufficient documentation

## 2014-03-13 DIAGNOSIS — Z87828 Personal history of other (healed) physical injury and trauma: Secondary | ICD-10-CM | POA: Insufficient documentation

## 2014-03-13 LAB — CBC WITH DIFFERENTIAL/PLATELET
Basophils Absolute: 0 10*3/uL (ref 0.0–0.1)
Basophils Relative: 0 % (ref 0–1)
Eosinophils Absolute: 0.1 10*3/uL (ref 0.0–0.7)
Eosinophils Relative: 2 % (ref 0–5)
HCT: 33.7 % — ABNORMAL LOW (ref 39.0–52.0)
HEMOGLOBIN: 11.2 g/dL — AB (ref 13.0–17.0)
Lymphocytes Relative: 20 % (ref 12–46)
Lymphs Abs: 1.2 10*3/uL (ref 0.7–4.0)
MCH: 29.9 pg (ref 26.0–34.0)
MCHC: 33.2 g/dL (ref 30.0–36.0)
MCV: 90.1 fL (ref 78.0–100.0)
MONO ABS: 0.6 10*3/uL (ref 0.1–1.0)
MONOS PCT: 10 % (ref 3–12)
NEUTROS ABS: 4 10*3/uL (ref 1.7–7.7)
Neutrophils Relative %: 68 % (ref 43–77)
Platelets: 198 10*3/uL (ref 150–400)
RBC: 3.74 MIL/uL — ABNORMAL LOW (ref 4.22–5.81)
RDW: 14.1 % (ref 11.5–15.5)
WBC: 5.9 10*3/uL (ref 4.0–10.5)

## 2014-03-13 LAB — COMPREHENSIVE METABOLIC PANEL
ALBUMIN: 2.7 g/dL — AB (ref 3.5–5.2)
ALT: 8 U/L (ref 0–53)
AST: 17 U/L (ref 0–37)
Alkaline Phosphatase: 37 U/L — ABNORMAL LOW (ref 39–117)
BUN: 15 mg/dL (ref 6–23)
CALCIUM: 9 mg/dL (ref 8.4–10.5)
CHLORIDE: 99 meq/L (ref 96–112)
CO2: 27 mEq/L (ref 19–32)
CREATININE: 1.15 mg/dL (ref 0.50–1.35)
GFR calc Af Amer: 72 mL/min — ABNORMAL LOW (ref >90)
GFR, EST NON AFRICAN AMERICAN: 62 mL/min — AB (ref >90)
Glucose, Bld: 415 mg/dL — ABNORMAL HIGH (ref 70–99)
Potassium: 4.2 mEq/L (ref 3.7–5.3)
Sodium: 137 mEq/L (ref 137–147)
Total Protein: 5.8 g/dL — ABNORMAL LOW (ref 6.0–8.3)

## 2014-03-13 LAB — URINALYSIS, ROUTINE W REFLEX MICROSCOPIC
Bilirubin Urine: NEGATIVE
Glucose, UA: >1000 mg/dL — AB
Ketones, ur: NEGATIVE mg/dL
LEUKOCYTES UA: NEGATIVE
Nitrite: NEGATIVE
Protein, ur: 100 mg/dL — AB
Specific Gravity, Urine: 1.025 (ref 1.005–1.030)
UROBILINOGEN UA: 0.2 mg/dL (ref 0.0–1.0)
pH: 6 (ref 5.0–8.0)

## 2014-03-13 LAB — URINE MICROSCOPIC-ADD ON

## 2014-03-13 LAB — TROPONIN I

## 2014-03-13 MED ORDER — TRAMADOL HCL 50 MG PO TABS: 50.0000 mg | ORAL_TABLET | Freq: Four times a day (QID) | ORAL | Status: DC | PRN

## 2014-03-13 NOTE — ED Notes (Signed)
Pt reporting mild chronic pain in back.  Denies any additional concerns at this time.  Denies nausea.

## 2014-03-13 NOTE — Discharge Instructions (Signed)
Follow up with your md this week. °

## 2014-03-13 NOTE — ED Provider Notes (Signed)
CSN: [id number]     Arrival date & time 03/13/14  1515 History   First MD Initiated Contact with Patient 03/13/14 1533     Chief Complaint  Patient presents with  . Fall     (Consider location/radiation/quality/duration/timing/severity/associated sxs/prior Treatment) Patient is a 73 y.o. male presenting with fall. The history is provided by the patient (the pt states he fell and has back pain).  Fall This is a new problem. The current episode started yesterday. The problem occurs every several days. The problem has not changed since onset.Pertinent negatives include no chest pain, no abdominal pain and no headaches. Nothing aggravates the symptoms. Nothing relieves the symptoms.    Past Medical History  Diagnosis Date  . Diabetes mellitus   . Hypertension   . Pancreatitis   . Neuropathy   . Hyperlipemia   . Peripheral vascular disease     a. 10/2013: severely abnormal ABIs, for OP evaluation.  [name] GERD (gastroesophageal reflux disease)   . S/P colonoscopy July 2003    hyperplastic polyp, diverticulosis, anal papilla and internal hemorrhoids r  . S/P endoscopy 2003    Dr. Braulio Bosch: erosive reflux esophagitis , PUD  . PUD (peptic ulcer disease)     diagnosed via EGD by Dr. Braulio Bosch at [place]  . Diverticulosis   . Hemorrhoids   . Coronary artery disease      a. BMS to LAD 1999. b. HSRA to LAD and stent to RCA 2007. c. NSTEMI/CABG x 3 in 04/2010. d. NSTEMI 10/2013 - secondary to demand ischemia in setting of COPD exacerbation with severe underlying CAD - cath with 3/3 patent grafts, for medical therapy.   [name] COPD (chronic obstructive pulmonary disease)     a. Ongoing tobacco abuse.  . Arthritis   . Neutropenia     a. Dx 10/2013 on labs - instructed to f/u PCP.  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   . Ischemic cardiomyopathy     a. previously EF 30% in 2011. b. 10/2013: EF 45-50% by echo.  .  Alcoholism     a. Remote alcoholism  . 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   Past Surgical History  Procedure Laterality Date  . Coronary artery bypass graft  June 2011    X3  . Right inguinal hernia repair    . Cholecystectomy      3-4 years ago  . Appendectomy      age 73  . Cataracts    . Colonoscopy  05/12/2002    Dr. [name]- diverticulosis, anal papilla, internal hemorrhoids, rectal polyps  . [name] dilation  04/16/2012    Schatzki's ring-status post dilation and disruption/ as described above. Grade 1 esophageal varices. Small hiatal hernia.  [name] dilation  04/16/2012    Procedure: [name] DILATION;  Surgeon: Daneil Dolin, MD;  [facility];  Service: Endoscopy;  Laterality: N/A;  . Colonoscopy  08/10/2012    Procedure: COLONOSCOPY;  Surgeon: Daneil Dolin, MD;  [facility]: [facility];  Service: Endoscopy;  Laterality: N/A;  9:45 needs 30 mins extra /have Glucagon on hand   Family History  Problem Relation Age of Onset  . Coronary artery disease Mother   . Coronary artery disease Father   . Diabetes Sister   . Suicidality Brother   . Cirrhosis Brother   . Colon cancer Neg Hx  History  Substance Use Topics  . Smoking status: Current Every Day Smoker -- 1.50 packs/day for 63 years    Types: Cigarettes  . Smokeless tobacco: Current User     Comment: on last pack now then will quit  . Alcohol Use: No     Comment: Quit 1985    Review of Systems  Constitutional: Negative for appetite change and fatigue.  HENT: Negative for congestion, ear discharge and sinus pressure.   Eyes: Negative for discharge.  Respiratory: Negative for cough.   Cardiovascular: Negative for chest pain.  Gastrointestinal: Negative for abdominal pain and diarrhea.  Genitourinary: Negative for frequency and hematuria.  Musculoskeletal: Positive for back pain.  Skin: Negative for rash.  Neurological: Negative for seizures and headaches.   Psychiatric/Behavioral: Negative for hallucinations.      Allergies  Review of patient's allergies indicates no known allergies.  Home Medications   Prior to Admission medications   Medication Sig Start Date End Date Taking? Authorizing Provider  Insulin Glargine (LANTUS SOLOSTAR [place]) Inject 30 Units into the skin daily.    Yes Historical Provider, MD  albuterol-ipratropium (COMBIVENT) 18-103 MCG/ACT inhaler Inhale 1-2 puffs into the lungs every 6 (six) hours as needed for wheezing or shortness of breath. 11/02/13   Dayna N Dunn, PA-C  aspirin EC 81 MG tablet Take 1 tablet (81 mg total) by mouth daily. 11/02/13   Dayna N Dunn, PA-C  atorvastatin (LIPITOR) 40 MG tablet Take 1 tablet (40 mg total) by mouth every evening. 11/02/13   Dayna N Dunn, PA-C  clopidogrel (PLAVIX) 75 MG tablet Take 1 tablet (75 mg total) by mouth daily. 11/02/13   Dayna N Dunn, PA-C  fenofibrate 160 MG tablet Take 160 mg by mouth daily.    Historical Provider, MD  glipiZIDE (GLUCOTROL XL) 2.5 MG 24 hr tablet Take 2.5 mg by mouth 2 (two) times daily.     Historical Provider, MD  lisinopril (PRINIVIL,ZESTRIL) 20 MG tablet Take 20 mg by mouth daily.      Historical Provider, MD  metoprolol tartrate (LOPRESSOR) 25 MG tablet Take 25 mg by mouth 2 (two) times daily.    Historical Provider, MD  nitroGLYCERIN (NITROSTAT) 0.4 MG SL tablet Place 0.4 mg under the tongue every 5 (five) minutes x 3 doses as needed.    Historical Provider, MD  Pancrelipase, Lip-Prot-Amyl, 24000 UNITS CPEP Take 3 capsules (72,000 Units total) by mouth 3 (three) times daily with meals. Take 2 with snacks. 08/05/13   Mahala Menghini, PA-C  pantoprazole (PROTONIX) 40 MG tablet Take 1 tablet (40 mg total) by mouth daily. 11/02/13   Dayna N Dunn, PA-C  predniSONE (DELTASONE) 10 MG tablet By mouth: 4 tablets daily for 2 days, then 3 tablets daily for 2 days, then 2 tablets daily for 2 days, then 1 tablet daily for 2 days, then 1/2 tablet daily for 2 days.  11/02/13   Dayna N Dunn, PA-C  spironolactone (ALDACTONE) 25 MG tablet Take 25 mg by mouth daily.      Historical Provider, MD  SYMBICORT 160-4.5 MCG/ACT inhaler Inhale 2 puffs into the lungs 2 (two) times daily.  07/04/12   Historical Provider, MD  traMADol (ULTRAM) 50 MG tablet Take 1 tablet (50 mg total) by mouth every 6 (six) hours as needed. 03/13/14   Maudry Diego, MD   BP 143/67  Pulse 78  Temp(Src) 98.5 F (36.9 C) (Oral)  Resp 20  Ht 5\' 11"  (1.803 m)  Wt 167 lb (75.751  kg)  BMI 23.30 kg/m2  SpO2 94% Physical Exam  Constitutional: He is oriented to person, place, and time. He appears well-developed.  HENT:  Head: Normocephalic.  Eyes: Conjunctivae and EOM are normal. No scleral icterus.  Neck: Neck supple. No thyromegaly present.  Cardiovascular: Normal rate and regular rhythm.  Exam reveals no gallop and no friction rub.   No murmur heard. Pulmonary/Chest: No stridor. He has no wheezes. He has no rales. He exhibits no tenderness.  Abdominal: He exhibits no distension. There is no tenderness. There is no rebound.  Musculoskeletal: Normal range of motion. He exhibits no edema.  Tender mild lumbar spine  Lymphadenopathy:    He has no cervical adenopathy.  Neurological: He is oriented to person, place, and time. He exhibits normal muscle tone. Coordination normal.  Skin: No rash noted. No erythema.  Psychiatric: He has a normal mood and affect. His behavior is normal.    ED Course  Procedures (including critical care time) Labs Review Labs Reviewed  CBC WITH DIFFERENTIAL - Abnormal; Notable for the following:    RBC 3.74 (*)    Hemoglobin 11.2 (*)    HCT 33.7 (*)    All other components within normal limits  COMPREHENSIVE METABOLIC PANEL - Abnormal; Notable for the following:    Glucose, Bld 415 (*)    Total Protein 5.8 (*)    Albumin 2.7 (*)    Alkaline Phosphatase 37 (*)    Total Bilirubin <0.2 (*)    GFR calc non Af Amer 62 (*)    GFR calc Af Amer 72 (*)     All other components within normal limits  URINALYSIS, ROUTINE W REFLEX MICROSCOPIC - Abnormal; Notable for the following:    Glucose, UA >1000 (*)    Hgb urine dipstick MODERATE (*)    Protein, ur 100 (*)    All other components within normal limits  TROPONIN I  URINE MICROSCOPIC-ADD ON    Imaging Review Ct Head Wo Contrast  03/13/2014   CLINICAL DATA:  Recurrent falls.  Syncope.  EXAM: CT HEAD WITHOUT CONTRAST  TECHNIQUE: Contiguous axial images were obtained from the base of the skull through the vertex without intravenous contrast.  COMPARISON:  Head CT scan 04/19/2010.  FINDINGS: Mild cortical atrophy is identified. There is no evidence of acute intracranial abnormality including infarct, hemorrhage, mass lesion, mass effect, midline shift or abnormal extra-axial fluid collection. No hydrocephalus or pneumocephalus. Sclerosis right mastoid air cells is unchanged. The calvarium is intact.  IMPRESSION: No acute finding.  Stable compared to prior exam.   Electronically Signed   By: Inge Rise M.D.   On: 03/13/2014 17:15   Ct Lumbar Spine Wo Contrast  03/13/2014   CLINICAL DATA:  Multiple falls over the last few days. Low back pain radiating to bilateral lower extremities.  EXAM: CT LUMBAR SPINE WITHOUT CONTRAST  TECHNIQUE: Multidetector CT imaging of the lumbar spine was performed without intravenous contrast administration. Multiplanar CT image reconstructions were also generated.  COMPARISON:  None.  FINDINGS: Vertebral body alignment, heights and disc space heights are within normal. There is no compression fracture or subluxation. There is very minimal spondylosis.  Axial images at the L1-2 and L2-3 levels demonstrate no disc herniation, canal stenosis or neural foraminal narrowing. The L3-4 level demonstrates a broad-based disc bulge without definite focal disc herniation. There is mild canal stenosis due to the disc disease, facet arthropathy and ligamentum flavum hypertrophy. There is  mild bilateral neural foraminal narrowing.  The  L4-5 level demonstrates a moderate broad-based disc bulge without definite focal disc herniation. There is borderline to mild canal stenosis at this level which is multifactorial. Facet arthropathy is present. There is mild bilateral neural foraminal narrowing.  The L5-S1 level demonstrates a mild broad-based disc bulge with possible small left paracentral focal disc herniation extending superiorly. There is no canal stenosis or significant neural foraminal narrowing. Facet arthropathy is present.  There is moderate calcified plaque over the abdominal aorta and iliac arteries.  IMPRESSION: No evidence of acute fracture or subluxation.  Minimal spondylosis of the lumbar spine with multilevel disc disease from the L3-4 level to the L5-S1 level. Possible small left paracentral disc herniation at the L5-S1 level extending superiorly. Canal stenosis at the L3-4 level greater than the L4-5 level which is multifactorial as described.   Electronically Signed   By: Marin Olp M.D.   On: 03/13/2014 17:20     EKG Interpretation None    pt ambulating without problems  MDM   Final diagnoses:  Fall  Back pain        Maudry Diego, MD 03/13/14 1821

## 2014-03-13 NOTE — ED Notes (Signed)
Pt c/o 5 different falls within 4 weeks time frame due to "losing his balance", presents to er today c/o lower back pain from the falls

## 2014-04-29 ENCOUNTER — Other Ambulatory Visit (HOSPITAL_COMMUNITY): Payer: Self-pay | Admitting: Physician Assistant

## 2014-06-28 ENCOUNTER — Encounter (HOSPITAL_COMMUNITY): Payer: Self-pay | Admitting: Emergency Medicine

## 2014-06-28 ENCOUNTER — Emergency Department (HOSPITAL_COMMUNITY)
Admission: EM | Admit: 2014-06-28 | Discharge: 2014-06-28 | Disposition: A | Discharge status: Home / Self Care | Payer: Medicare Other | Attending: Emergency Medicine | Admitting: Emergency Medicine

## 2014-06-28 ENCOUNTER — Emergency Department (HOSPITAL_COMMUNITY): Payer: Medicare Other

## 2014-06-28 DIAGNOSIS — I10 Essential (primary) hypertension: Secondary | ICD-10-CM | POA: Diagnosis not present

## 2014-06-28 DIAGNOSIS — M79609 Pain in unspecified limb: Secondary | ICD-10-CM | POA: Insufficient documentation

## 2014-06-28 DIAGNOSIS — Z951 Presence of aortocoronary bypass graft: Secondary | ICD-10-CM | POA: Insufficient documentation

## 2014-06-28 DIAGNOSIS — M129 Arthropathy, unspecified: Secondary | ICD-10-CM | POA: Insufficient documentation

## 2014-06-28 DIAGNOSIS — J441 Chronic obstructive pulmonary disease with (acute) exacerbation: Secondary | ICD-10-CM | POA: Diagnosis not present

## 2014-06-28 DIAGNOSIS — E119 Type 2 diabetes mellitus without complications: Secondary | ICD-10-CM | POA: Diagnosis not present

## 2014-06-28 DIAGNOSIS — IMO0002 Reserved for concepts with insufficient information to code with codable children: Secondary | ICD-10-CM | POA: Diagnosis not present

## 2014-06-28 DIAGNOSIS — I739 Peripheral vascular disease, unspecified: Secondary | ICD-10-CM | POA: Insufficient documentation

## 2014-06-28 DIAGNOSIS — G589 Mononeuropathy, unspecified: Secondary | ICD-10-CM | POA: Diagnosis not present

## 2014-06-28 DIAGNOSIS — E785 Hyperlipidemia, unspecified: Secondary | ICD-10-CM | POA: Insufficient documentation

## 2014-06-28 DIAGNOSIS — Z7982 Long term (current) use of aspirin: Secondary | ICD-10-CM | POA: Diagnosis not present

## 2014-06-28 DIAGNOSIS — Z7902 Long term (current) use of antithrombotics/antiplatelets: Secondary | ICD-10-CM | POA: Diagnosis not present

## 2014-06-28 DIAGNOSIS — I2510 Atherosclerotic heart disease of native coronary artery without angina pectoris: Secondary | ICD-10-CM | POA: Diagnosis not present

## 2014-06-28 DIAGNOSIS — Z87828 Personal history of other (healed) physical injury and trauma: Secondary | ICD-10-CM | POA: Diagnosis not present

## 2014-06-28 DIAGNOSIS — Z79899 Other long term (current) drug therapy: Secondary | ICD-10-CM | POA: Insufficient documentation

## 2014-06-28 DIAGNOSIS — Z794 Long term (current) use of insulin: Secondary | ICD-10-CM | POA: Diagnosis not present

## 2014-06-28 DIAGNOSIS — K859 Acute pancreatitis without necrosis or infection, unspecified: Secondary | ICD-10-CM | POA: Diagnosis not present

## 2014-06-28 DIAGNOSIS — K219 Gastro-esophageal reflux disease without esophagitis: Secondary | ICD-10-CM | POA: Insufficient documentation

## 2014-06-28 DIAGNOSIS — I509 Heart failure, unspecified: Secondary | ICD-10-CM | POA: Diagnosis not present

## 2014-06-28 DIAGNOSIS — Z8711 Personal history of peptic ulcer disease: Secondary | ICD-10-CM | POA: Diagnosis not present

## 2014-06-28 LAB — CBC WITH DIFFERENTIAL/PLATELET
Basophils Absolute: 0 10*3/uL (ref 0.0–0.1)
Basophils Relative: 1 % (ref 0–1)
Eosinophils Absolute: 0.2 10*3/uL (ref 0.0–0.7)
Eosinophils Relative: 3 % (ref 0–5)
HCT: 33.2 % — ABNORMAL LOW (ref 39.0–52.0)
HEMOGLOBIN: 10.7 g/dL — AB (ref 13.0–17.0)
LYMPHS ABS: 1.5 10*3/uL (ref 0.7–4.0)
Lymphocytes Relative: 23 % (ref 12–46)
MCH: 30.1 pg (ref 26.0–34.0)
MCHC: 32.2 g/dL (ref 30.0–36.0)
MCV: 93.5 fL (ref 78.0–100.0)
MONOS PCT: 7 % (ref 3–12)
Monocytes Absolute: 0.5 10*3/uL (ref 0.1–1.0)
Neutro Abs: 4.6 10*3/uL (ref 1.7–7.7)
Neutrophils Relative %: 66 % (ref 43–77)
Platelets: 288 10*3/uL (ref 150–400)
RBC: 3.55 MIL/uL — AB (ref 4.22–5.81)
RDW: 15.1 % (ref 11.5–15.5)
WBC: 6.8 10*3/uL (ref 4.0–10.5)

## 2014-06-28 LAB — COMPREHENSIVE METABOLIC PANEL
ALBUMIN: 2.8 g/dL — AB (ref 3.5–5.2)
ALK PHOS: 47 U/L (ref 39–117)
ALT: 9 U/L (ref 0–53)
ANION GAP: 9 (ref 5–15)
AST: 21 U/L (ref 0–37)
BUN: 20 mg/dL (ref 6–23)
CO2: 32 mEq/L (ref 19–32)
CREATININE: 1.17 mg/dL (ref 0.50–1.35)
Calcium: 9.3 mg/dL (ref 8.4–10.5)
Chloride: 101 mEq/L (ref 96–112)
GFR calc non Af Amer: 61 mL/min — ABNORMAL LOW (ref >90)
GFR, EST AFRICAN AMERICAN: 71 mL/min — AB (ref >90)
GLUCOSE: 165 mg/dL — AB (ref 70–99)
POTASSIUM: 4.7 meq/L (ref 3.7–5.3)
Sodium: 142 mEq/L (ref 137–147)
Total Bilirubin: <0.2 mg/dL — ABNORMAL LOW (ref 0.3–1.2)
Total Protein: 6.6 g/dL (ref 6.0–8.3)

## 2014-06-28 LAB — PRO B NATRIURETIC PEPTIDE: Pro B Natriuretic peptide (BNP): 1600 pg/mL — ABNORMAL HIGH (ref 0–125)

## 2014-06-28 LAB — TROPONIN I

## 2014-06-28 MED ORDER — FUROSEMIDE 10 MG/ML IJ SOLN
40.0000 mg | Freq: Once | INTRAMUSCULAR | Status: AC
  Administered 2014-06-28: 40 mg via INTRAVENOUS
  Filled 2014-06-28: qty 4

## 2014-06-28 MED ORDER — FUROSEMIDE 20 MG PO TABS: 40.0000 mg | ORAL_TABLET | Freq: Every day | ORAL | Status: DC

## 2014-06-28 NOTE — ED Notes (Signed)
Pt reports pain in left leg from diabetic ulcers.  Pt presents with redness, swelling and abcesses. Pt was put on antibiotics for 10 days for leg infection.  Pt reports fevers and chills.

## 2014-06-28 NOTE — ED Provider Notes (Signed)
CSN: [id number]     Arrival date & time 06/28/14  1537 History  This chart was scribed for Stephen Diego, MD by Lowella Petties, ED Scribe. The patient was seen in room [facility]. Patient's care was started at 4:55 PM.   Chief Complaint  Patient presents with  . Leg Pain   Patient is a 73 y.o. male presenting with leg pain. The history is provided by the patient. No language interpreter was used.  Leg Pain Location:  Leg Injury: no   Leg location:  L leg and R leg Pain details:    Quality:  Shooting and dull   Radiates to:  Does not radiate   Severity:  Moderate   Onset quality:  Gradual   Timing:  Constant   Progression:  Worsening Chronicity:  Recurrent Dislocation: no   Foreign body present:  No foreign bodies Prior injury to area:  No Worsened by:  Nothing tried Ineffective treatments:  None tried Associated symptoms: swelling   Associated symptoms: no back pain and no fatigue    HPI Comments: Stephen [name] is a 73 y.o. male who presents to the Emergency Department complaining of swelling and shooting pain that runs up and down his legs. He reports associated difficulty walking. He reports occasional episodes of SOB and denies SOB currently. He reports that his throat was scarred by an injury when he was a child. He reports smoking 1.5 PPD.   PCP: Reynold Bowen, NT  Past Medical History  Diagnosis Date  . Diabetes mellitus   . Hypertension   . Pancreatitis   . Neuropathy   . Hyperlipemia   . Peripheral vascular disease     a. 10/2013: severely abnormal ABIs, for OP evaluation.  [name] GERD (gastroesophageal reflux disease)   . S/P colonoscopy July 2003    hyperplastic polyp, diverticulosis, anal papilla and internal hemorrhoids r  . S/P endoscopy 2003    Dr. Braulio Bosch: erosive reflux esophagitis , PUD  . PUD (peptic ulcer disease)     diagnosed via EGD by Dr. Braulio Bosch at [place]  . Diverticulosis   . Hemorrhoids   . Coronary artery disease      a. BMS to  LAD 1999. b. HSRA to LAD and stent to RCA 2007. c. NSTEMI/CABG x 3 in 04/2010. d. NSTEMI 10/2013 - secondary to demand ischemia in setting of COPD exacerbation with severe underlying CAD - cath with 3/3 patent grafts, for medical therapy.   [name] COPD (chronic obstructive pulmonary disease)     a. Ongoing tobacco abuse.  . Arthritis   . Neutropenia     a. Dx 10/2013 on labs - instructed to f/u PCP.  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   . Ischemic cardiomyopathy     a. previously EF 30% in 2011. b. 10/2013: EF 45-50% by echo.  . Alcoholism     a. Remote alcoholism  . 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   Past Surgical History  Procedure Laterality Date  . Coronary artery bypass graft  June 2011    X3  . Right inguinal hernia repair    . Cholecystectomy      3-4 years ago  . Appendectomy      age 56  . Cataracts    . Colonoscopy  05/12/2002    Dr. [name]- diverticulosis, anal papilla, internal hemorrhoids,  rectal polyps  . [name] dilation  04/16/2012    Schatzki's ring-status post dilation and disruption/ as described above. Grade 1 esophageal varices. Small hiatal hernia.  [name] dilation  04/16/2012    Procedure: [name] DILATION;  Surgeon: Daneil Dolin, MD;  [facility];  Service: Endoscopy;  Laterality: N/A;  . Colonoscopy  08/10/2012    Procedure: COLONOSCOPY;  Surgeon: Daneil Dolin, MD;  [facility];  Service: Endoscopy;  Laterality: N/A;  9:45 needs 30 mins extra /have Glucagon on hand   Family History  Problem Relation Age of Onset  . Coronary artery disease Mother   . Coronary artery disease Father   . Diabetes Sister   . Suicidality Brother   . Cirrhosis Brother   . Colon cancer Neg Hx    History  Substance Use Topics  . Smoking status: Current Every Day Smoker -- 1.50 packs/day for 63 years    Types: Cigarettes  .  Smokeless tobacco: Current User     Comment: on last pack now then will quit  . Alcohol Use: No     Comment: Quit 1985    Review of Systems  Constitutional: Negative for appetite change and fatigue.  HENT: Negative for congestion, ear discharge and sinus pressure.   Eyes: Negative for discharge.  Respiratory: Positive for shortness of breath. Negative for cough.   Cardiovascular: Positive for leg swelling. Negative for chest pain.  Gastrointestinal: Negative for abdominal pain and diarrhea.  Genitourinary: Negative for frequency and hematuria.  Musculoskeletal: Positive for myalgias (Leg pain bilaterally). Negative for back pain.  Skin: Negative for rash.  Neurological: Negative for seizures and headaches.  Psychiatric/Behavioral: Negative for hallucinations.   Allergies  Levaquin  Home Medications   Prior to Admission medications   Medication Sig Start Date End Date Taking? Authorizing Provider  albuterol-ipratropium (COMBIVENT) 18-103 MCG/ACT inhaler Inhale 1-2 puffs into the lungs every 6 (six) hours as needed for wheezing or shortness of breath. 11/02/13  Yes Dayna N Dunn, PA-C  aspirin EC 325 MG tablet Take 325 mg by mouth every morning.   Yes Historical Provider, MD  aspirin EC 81 MG tablet Take 81 mg by mouth every morning. 11/02/13 06/28/14 Yes Dayna N Dunn, PA-C  atorvastatin (LIPITOR) 40 MG tablet Take 40 mg by mouth daily.   Yes Historical Provider, MD  clopidogrel (PLAVIX) 75 MG tablet Take 75 mg by mouth daily.   Yes Historical Provider, MD  Insulin Glargine (LANTUS SOLOSTAR [place]) Inject 30 Units into the skin daily.    Yes Historical Provider, MD  levofloxacin (LEVAQUIN) 500 MG tablet Take 500 mg by mouth daily. 06/22/14  Yes Historical Provider, MD  SYMBICORT 160-4.5 MCG/ACT inhaler Inhale 2 puffs into the lungs 2 (two) times daily.  07/04/12  Yes Historical Provider, MD  traMADol (ULTRAM) 50 MG tablet Take by mouth every 6 (six) hours as needed.   Yes Historical Provider,  MD  fenofibrate 160 MG tablet Take 160 mg by mouth daily.    Historical Provider, MD  gabapentin (NEURONTIN) 100 MG capsule Take 100 mg by mouth 3 (three) times daily. 06/08/14   Historical Provider, MD  glipiZIDE (GLUCOTROL XL) 2.5 MG 24 hr tablet Take 2.5 mg by mouth 2 (two) times daily.     Historical Provider, MD  lisinopril (PRINIVIL,ZESTRIL) 20 MG tablet Take 20 mg by mouth daily.      Historical Provider, MD  metoprolol tartrate (LOPRESSOR) 25 MG tablet Take 25 mg by mouth 2 (  two) times daily.    Historical Provider, MD  nitroGLYCERIN (NITROSTAT) 0.4 MG SL tablet Place 0.4 mg under the tongue every 5 (five) minutes x 3 doses as needed.    Historical Provider, MD  Pancrelipase, Lip-Prot-Amyl, 24000 UNITS CPEP Take 3 capsules (72,000 Units total) by mouth 3 (three) times daily with meals. Take 2 with snacks. 08/05/13   Mahala Menghini, PA-C  pantoprazole (PROTONIX) 40 MG tablet Take 1 tablet (40 mg total) by mouth daily. 11/02/13   Dayna N Dunn, PA-C  predniSONE (DELTASONE) 10 MG tablet By mouth: 4 tablets daily for 2 days, then 3 tablets daily for 2 days, then 2 tablets daily for 2 days, then 1 tablet daily for 2 days, then 1/2 tablet daily for 2 days. 11/02/13   Dayna N Dunn, PA-C  spironolactone (ALDACTONE) 25 MG tablet Take 25 mg by mouth daily.      Historical Provider, MD   Triage Vitals: BP 185/77  Pulse 92  Temp(Src) 98.5 F (36.9 C) (Oral)  Resp 18  Ht 5\' 6"  (1.676 m)  Wt 170 lb (77.111 kg)  BMI 27.45 kg/m2  SpO2 98% Physical Exam  Constitutional: He is oriented to person, place, and time. He appears well-developed.  HENT:  Head: Normocephalic.  Eyes: Conjunctivae and EOM are normal. No scleral icterus.  Neck: Neck supple. No thyromegaly present.  Cardiovascular: Normal rate and regular rhythm.  Exam reveals no gallop and no friction rub.   No murmur heard. Pulmonary/Chest: No stridor. He has wheezes. He has no rales. He exhibits no tenderness.  Moderate wheezing bialterally.   Abdominal: He exhibits no distension. There is no tenderness. There is no rebound.  Musculoskeletal: Normal range of motion. He exhibits edema (2+, bilaterallly).  Lymphadenopathy:    He has no cervical adenopathy.  Neurological: He is oriented to person, place, and time. He exhibits normal muscle tone. Coordination normal.  Skin: No rash noted. There is erythema.  Multiple ulcers on BLE.  Psychiatric: He has a normal mood and affect. His behavior is normal.    ED Course  Procedures (including critical care time) DIAGNOSTIC STUDIES: Oxygen Saturation is 98% on room air, normal by my interpretation.    COORDINATION OF CARE: 5:00 PM-Discussed treatment plan with pt at bedside and pt agreed to plan.   Labs Review Labs Reviewed - No data to display  Imaging Review No results found.   EKG Interpretation   Date/Time:  Tuesday June 28 2014 17:23:52 EDT Ventricular Rate:  85 PR Interval:  171 QRS Duration: 128 QT Interval:  385 QTC Calculation: 458 R Axis:   -43 Text Interpretation:  Sinus rhythm Left bundle branch block Confirmed by  Tadd Holtmeyer  MD, Emilie Carp [phone]) on 06/28/2014 7:17:40 PM      MDM   Final diagnoses:  None    Mild chf,  tx with lasix and follow up with cards  The chart was scribed for me under my direct supervision.  I personally performed the history, physical, and medical decision making and all procedures in the evaluation of this patient.Stephen Diego, MD 06/28/14 [phone]

## 2014-07-09 ENCOUNTER — Emergency Department (HOSPITAL_COMMUNITY): Payer: Medicare Other

## 2014-07-09 ENCOUNTER — Inpatient Hospital Stay (HOSPITAL_COMMUNITY)
Admission: EM | Admit: 2014-07-09 | Discharge: 2014-07-14 | DRG: 299 | Disposition: A | Discharge status: Home Health | Payer: Medicare Other | Attending: Internal Medicine | Admitting: Internal Medicine

## 2014-07-09 ENCOUNTER — Encounter (HOSPITAL_COMMUNITY): Payer: Self-pay | Admitting: Emergency Medicine

## 2014-07-09 DIAGNOSIS — I7092 Chronic total occlusion of artery of the extremities: Secondary | ICD-10-CM | POA: Diagnosis present

## 2014-07-09 DIAGNOSIS — I7090 Unspecified atherosclerosis: Secondary | ICD-10-CM

## 2014-07-09 DIAGNOSIS — I5022 Chronic systolic (congestive) heart failure: Secondary | ICD-10-CM

## 2014-07-09 DIAGNOSIS — L97909 Non-pressure chronic ulcer of unspecified part of unspecified lower leg with unspecified severity: Secondary | ICD-10-CM | POA: Diagnosis present

## 2014-07-09 DIAGNOSIS — I718 Aortic aneurysm of unspecified site, ruptured: Secondary | ICD-10-CM

## 2014-07-09 DIAGNOSIS — I248 Other forms of acute ischemic heart disease: Secondary | ICD-10-CM

## 2014-07-09 DIAGNOSIS — I798 Other disorders of arteries, arterioles and capillaries in diseases classified elsewhere: Secondary | ICD-10-CM | POA: Diagnosis present

## 2014-07-09 DIAGNOSIS — R7989 Other specified abnormal findings of blood chemistry: Secondary | ICD-10-CM

## 2014-07-09 DIAGNOSIS — K861 Other chronic pancreatitis: Secondary | ICD-10-CM

## 2014-07-09 DIAGNOSIS — L97921 Non-pressure chronic ulcer of unspecified part of left lower leg limited to breakdown of skin: Secondary | ICD-10-CM

## 2014-07-09 DIAGNOSIS — L98499 Non-pressure chronic ulcer of skin of other sites with unspecified severity: Principal | ICD-10-CM | POA: Diagnosis present

## 2014-07-09 DIAGNOSIS — E1059 Type 1 diabetes mellitus with other circulatory complications: Secondary | ICD-10-CM | POA: Diagnosis present

## 2014-07-09 DIAGNOSIS — Z951 Presence of aortocoronary bypass graft: Secondary | ICD-10-CM

## 2014-07-09 DIAGNOSIS — K219 Gastro-esophageal reflux disease without esophagitis: Secondary | ICD-10-CM | POA: Diagnosis present

## 2014-07-09 DIAGNOSIS — E1159 Type 2 diabetes mellitus with other circulatory complications: Secondary | ICD-10-CM | POA: Diagnosis present

## 2014-07-09 DIAGNOSIS — M79609 Pain in unspecified limb: Secondary | ICD-10-CM | POA: Diagnosis not present

## 2014-07-09 DIAGNOSIS — E138 Other specified diabetes mellitus with unspecified complications: Secondary | ICD-10-CM | POA: Diagnosis present

## 2014-07-09 DIAGNOSIS — Z794 Long term (current) use of insulin: Secondary | ICD-10-CM

## 2014-07-09 DIAGNOSIS — L97929 Non-pressure chronic ulcer of unspecified part of left lower leg with unspecified severity: Secondary | ICD-10-CM

## 2014-07-09 DIAGNOSIS — I252 Old myocardial infarction: Secondary | ICD-10-CM

## 2014-07-09 DIAGNOSIS — E8770 Fluid overload, unspecified: Secondary | ICD-10-CM

## 2014-07-09 DIAGNOSIS — R778 Other specified abnormalities of plasma proteins: Secondary | ICD-10-CM | POA: Diagnosis present

## 2014-07-09 DIAGNOSIS — Z7902 Long term (current) use of antithrombotics/antiplatelets: Secondary | ICD-10-CM

## 2014-07-09 DIAGNOSIS — I2489 Other forms of acute ischemic heart disease: Secondary | ICD-10-CM | POA: Diagnosis present

## 2014-07-09 DIAGNOSIS — I2589 Other forms of chronic ischemic heart disease: Secondary | ICD-10-CM

## 2014-07-09 DIAGNOSIS — I214 Non-ST elevation (NSTEMI) myocardial infarction: Secondary | ICD-10-CM

## 2014-07-09 DIAGNOSIS — F17200 Nicotine dependence, unspecified, uncomplicated: Secondary | ICD-10-CM | POA: Diagnosis present

## 2014-07-09 DIAGNOSIS — I70219 Atherosclerosis of native arteries of extremities with intermittent claudication, unspecified extremity: Secondary | ICD-10-CM | POA: Diagnosis present

## 2014-07-09 DIAGNOSIS — I259 Chronic ischemic heart disease, unspecified: Secondary | ICD-10-CM

## 2014-07-09 DIAGNOSIS — I5023 Acute on chronic systolic (congestive) heart failure: Secondary | ICD-10-CM | POA: Diagnosis present

## 2014-07-09 DIAGNOSIS — E1149 Type 2 diabetes mellitus with other diabetic neurological complication: Secondary | ICD-10-CM | POA: Diagnosis present

## 2014-07-09 DIAGNOSIS — I714 Abdominal aortic aneurysm, without rupture, unspecified: Secondary | ICD-10-CM | POA: Diagnosis present

## 2014-07-09 DIAGNOSIS — E1142 Type 2 diabetes mellitus with diabetic polyneuropathy: Secondary | ICD-10-CM | POA: Diagnosis present

## 2014-07-09 DIAGNOSIS — J189 Pneumonia, unspecified organism: Secondary | ICD-10-CM

## 2014-07-09 DIAGNOSIS — J9601 Acute respiratory failure with hypoxia: Secondary | ICD-10-CM | POA: Diagnosis present

## 2014-07-09 DIAGNOSIS — I739 Peripheral vascular disease, unspecified: Principal | ICD-10-CM | POA: Diagnosis present

## 2014-07-09 DIAGNOSIS — E875 Hyperkalemia: Secondary | ICD-10-CM

## 2014-07-09 DIAGNOSIS — E785 Hyperlipidemia, unspecified: Secondary | ICD-10-CM | POA: Diagnosis present

## 2014-07-09 DIAGNOSIS — I8500 Esophageal varices without bleeding: Secondary | ICD-10-CM | POA: Diagnosis present

## 2014-07-09 DIAGNOSIS — I509 Heart failure, unspecified: Secondary | ICD-10-CM | POA: Diagnosis present

## 2014-07-09 DIAGNOSIS — Z9889 Other specified postprocedural states: Secondary | ICD-10-CM

## 2014-07-09 DIAGNOSIS — L97209 Non-pressure chronic ulcer of unspecified calf with unspecified severity: Secondary | ICD-10-CM | POA: Diagnosis present

## 2014-07-09 DIAGNOSIS — I2510 Atherosclerotic heart disease of native coronary artery without angina pectoris: Secondary | ICD-10-CM | POA: Diagnosis present

## 2014-07-09 DIAGNOSIS — Z881 Allergy status to other antibiotic agents status: Secondary | ICD-10-CM

## 2014-07-09 DIAGNOSIS — J9600 Acute respiratory failure, unspecified whether with hypoxia or hypercapnia: Secondary | ICD-10-CM | POA: Diagnosis present

## 2014-07-09 DIAGNOSIS — N62 Hypertrophy of breast: Secondary | ICD-10-CM

## 2014-07-09 DIAGNOSIS — J449 Chronic obstructive pulmonary disease, unspecified: Secondary | ICD-10-CM

## 2014-07-09 DIAGNOSIS — I5042 Chronic combined systolic (congestive) and diastolic (congestive) heart failure: Secondary | ICD-10-CM | POA: Diagnosis present

## 2014-07-09 DIAGNOSIS — J441 Chronic obstructive pulmonary disease with (acute) exacerbation: Secondary | ICD-10-CM | POA: Diagnosis present

## 2014-07-09 DIAGNOSIS — G589 Mononeuropathy, unspecified: Secondary | ICD-10-CM | POA: Diagnosis present

## 2014-07-09 DIAGNOSIS — R6881 Early satiety: Secondary | ICD-10-CM

## 2014-07-09 DIAGNOSIS — Z7982 Long term (current) use of aspirin: Secondary | ICD-10-CM

## 2014-07-09 DIAGNOSIS — I10 Essential (primary) hypertension: Secondary | ICD-10-CM | POA: Diagnosis present

## 2014-07-09 DIAGNOSIS — F1011 Alcohol abuse, in remission: Secondary | ICD-10-CM

## 2014-07-09 LAB — COMPREHENSIVE METABOLIC PANEL
ALT: 10 U/L (ref 0–53)
AST: 20 U/L (ref 0–37)
Albumin: 2.7 g/dL — ABNORMAL LOW (ref 3.5–5.2)
Alkaline Phosphatase: 44 U/L (ref 39–117)
Anion gap: 12 (ref 5–15)
BUN: 15 mg/dL (ref 6–23)
CALCIUM: 9.3 mg/dL (ref 8.4–10.5)
CO2: 28 mEq/L (ref 19–32)
CREATININE: 1.04 mg/dL (ref 0.50–1.35)
Chloride: 99 mEq/L (ref 96–112)
GFR calc Af Amer: 81 mL/min — ABNORMAL LOW (ref >90)
GFR, EST NON AFRICAN AMERICAN: 70 mL/min — AB (ref >90)
Glucose, Bld: 210 mg/dL — ABNORMAL HIGH (ref 70–99)
Potassium: 4 mEq/L (ref 3.7–5.3)
SODIUM: 139 meq/L (ref 137–147)
TOTAL PROTEIN: 6.7 g/dL (ref 6.0–8.3)
Total Bilirubin: <0.2 mg/dL — ABNORMAL LOW (ref 0.3–1.2)

## 2014-07-09 LAB — URINE MICROSCOPIC-ADD ON

## 2014-07-09 LAB — CBC WITH DIFFERENTIAL/PLATELET
BASOS ABS: 0 10*3/uL (ref 0.0–0.1)
BASOS PCT: 0 % (ref 0–1)
EOS PCT: 1 % (ref 0–5)
Eosinophils Absolute: 0.1 10*3/uL (ref 0.0–0.7)
HEMATOCRIT: 31.5 % — AB (ref 39.0–52.0)
Hemoglobin: 10.1 g/dL — ABNORMAL LOW (ref 13.0–17.0)
Lymphocytes Relative: 10 % — ABNORMAL LOW (ref 12–46)
Lymphs Abs: 0.8 10*3/uL (ref 0.7–4.0)
MCH: 29.6 pg (ref 26.0–34.0)
MCHC: 32.1 g/dL (ref 30.0–36.0)
MCV: 92.4 fL (ref 78.0–100.0)
MONO ABS: 0.9 10*3/uL (ref 0.1–1.0)
Monocytes Relative: 10 % (ref 3–12)
Neutro Abs: 6.5 10*3/uL (ref 1.7–7.7)
Neutrophils Relative %: 79 % — ABNORMAL HIGH (ref 43–77)
PLATELETS: 253 10*3/uL (ref 150–400)
RBC: 3.41 MIL/uL — ABNORMAL LOW (ref 4.22–5.81)
RDW: 15.5 % (ref 11.5–15.5)
WBC: 8.2 10*3/uL (ref 4.0–10.5)

## 2014-07-09 LAB — URINALYSIS, ROUTINE W REFLEX MICROSCOPIC
Bilirubin Urine: NEGATIVE
Glucose, UA: 500 mg/dL — AB
Ketones, ur: NEGATIVE mg/dL
LEUKOCYTES UA: NEGATIVE
Nitrite: NEGATIVE
PH: 5.5 (ref 5.0–8.0)
SPECIFIC GRAVITY, URINE: 1.02 (ref 1.005–1.030)
Urobilinogen, UA: 0.2 mg/dL (ref 0.0–1.0)

## 2014-07-09 LAB — CBG MONITORING, ED: Glucose-Capillary: 185 mg/dL — ABNORMAL HIGH (ref 70–99)

## 2014-07-09 MED ORDER — METHYLPREDNISOLONE SODIUM SUCC 125 MG IJ SOLR
125.0000 mg | Freq: Once | INTRAMUSCULAR | Status: AC
  Administered 2014-07-09: 125 mg via INTRAVENOUS
  Filled 2014-07-09: qty 2

## 2014-07-09 MED ORDER — FUROSEMIDE 10 MG/ML IJ SOLN
40.0000 mg | Freq: Once | INTRAMUSCULAR | Status: AC
  Administered 2014-07-09: 40 mg via INTRAVENOUS
  Filled 2014-07-09: qty 4

## 2014-07-09 MED ORDER — ALBUTEROL SULFATE (2.5 MG/3ML) 0.083% IN NEBU
5.0000 mg | INHALATION_SOLUTION | Freq: Once | RESPIRATORY_TRACT | Status: AC
  Administered 2014-07-09: 5 mg via RESPIRATORY_TRACT
  Filled 2014-07-09: qty 6

## 2014-07-09 NOTE — ED Notes (Signed)
The pt is c/o of lt leg pain with lesions dark circular up and down the lt leg that he has had for 3-4 months. The pt reports that he is a diabetic and he has neuropathy.  He has a doctor but states that  Nobody will do anything for him

## 2014-07-09 NOTE — ED Notes (Signed)
MD at bedside. 

## 2014-07-09 NOTE — ED Provider Notes (Signed)
CSN: [id number]     Arrival date & time 07/09/14  2056 History   First MD Initiated Contact with Patient 07/09/14 2302     Chief Complaint  Patient presents with  . Leg Pain     (Consider location/radiation/quality/duration/timing/severity/associated sxs/prior Treatment) HPI Patient with a history of PVD, coronary artery disease, COPD and CHF presents with ongoing left greater than right lower extremity pain. Patient says the pain is worse with activity it starts in the mid thigh and radiates down the leg. It improves with rest. He has multiple poorly healing lesions on the left extremity. Mildly erythematous compared to the right. He has bilateral lower extremity edema. He complains of mild shortness of breath especially with any exertion. He states he is yet to see a vascular surgeon. Denies new cough, fever or chills. Patient denies any chest pain. Past Medical History  Diagnosis Date  . Diabetes mellitus   . Hypertension   . Pancreatitis   . Neuropathy   . Hyperlipemia   . Peripheral vascular disease     a. 10/2013: severely abnormal ABIs, for OP evaluation.  [name] GERD (gastroesophageal reflux disease)   . S/P colonoscopy July 2003    hyperplastic polyp, diverticulosis, anal papilla and internal hemorrhoids r  . S/P endoscopy 2003    Dr. Braulio Bosch: erosive reflux esophagitis , PUD  . PUD (peptic ulcer disease)     diagnosed via EGD by Dr. Braulio Bosch at [place]  . Diverticulosis   . Hemorrhoids   . Coronary artery disease      a. BMS to LAD 1999. b. HSRA to LAD and stent to RCA 2007. c. NSTEMI/CABG x 3 in 04/2010. d. NSTEMI 10/2013 - secondary to demand ischemia in setting of COPD exacerbation with severe underlying CAD - cath with 3/3 patent grafts, for medical therapy.   [name] COPD (chronic obstructive pulmonary disease)     a. Ongoing tobacco abuse.  . Arthritis   . Neutropenia     a. Dx 10/2013 on labs - instructed to f/u PCP.  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   . Ischemic cardiomyopathy     a. previously EF 30% in 2011. b. 10/2013: EF 45-50% by echo.  . Alcoholism     a. Remote alcoholism  . 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   Past Surgical History  Procedure Laterality Date  . Coronary artery bypass graft  June 2011    X3  . Right inguinal hernia repair    . Cholecystectomy      3-4 years ago  . Appendectomy      age 25  . Cataracts    . Colonoscopy  05/12/2002    Dr. [name]- diverticulosis, anal papilla, internal hemorrhoids, rectal polyps  . [name] dilation  04/16/2012    Schatzki's ring-status post dilation and disruption/ as described above. Grade 1 esophageal varices. Small hiatal hernia.  [name] dilation  04/16/2012    Procedure: [name] DILATION;  Surgeon: Daneil Dolin, MD;  Location: [facility];  Service: Endoscopy;  Laterality: N/A;  . Colonoscopy  08/10/2012    Procedure: COLONOSCOPY;  Surgeon: Daneil Dolin, MD;  Location: [facility];  Service: Endoscopy;  Laterality: N/A;  9:45 needs 30 mins extra /have Glucagon on hand   Family History  Problem Relation Age of Onset  . Coronary artery disease Mother   .  Coronary artery disease Father   . Diabetes Sister   . Suicidality Brother   . Cirrhosis Brother   . Colon cancer Neg Hx    History  Substance Use Topics  . Smoking status: Current Every Day Smoker -- 1.50 packs/day for 63 years    Types: Cigarettes  . Smokeless tobacco: Current User     Comment: on last pack now then will quit  . Alcohol Use: No     Comment: Quit 1985    Review of Systems  Constitutional: Negative for fever and chills.  Respiratory: Positive for shortness of breath and wheezing. Negative for cough.   Cardiovascular: Positive for leg swelling. Negative for chest pain and palpitations.  Gastrointestinal: Negative for nausea, vomiting and abdominal pain.  Musculoskeletal:  Positive for myalgias. Negative for arthralgias, back pain, neck pain and neck stiffness.  Skin: Positive for color change. Negative for wound.  Neurological: Negative for dizziness, weakness, light-headedness, numbness and headaches.  All other systems reviewed and are negative.     Allergies  Levaquin  Home Medications   Prior to Admission medications   Medication Sig Start Date End Date Taking? Authorizing Provider  albuterol-ipratropium (COMBIVENT) 18-103 MCG/ACT inhaler Inhale 1-2 puffs into the lungs every 6 (six) hours as needed for wheezing or shortness of breath. 11/02/13  Yes Dayna N Dunn, PA-C  aspirin EC 325 MG tablet Take 325 mg by mouth every morning.   Yes Historical Provider, MD  atorvastatin (LIPITOR) 40 MG tablet Take 40 mg by mouth daily.   Yes Historical Provider, MD  clopidogrel (PLAVIX) 75 MG tablet Take 75 mg by mouth daily.   Yes Historical Provider, MD  fenofibrate 160 MG tablet Take 160 mg by mouth daily with breakfast.    Yes Historical Provider, MD  furosemide (LASIX) 20 MG tablet Take 2 tablets (40 mg total) by mouth daily. 06/28/14  Yes Maudry Diego, MD  gabapentin (NEURONTIN) 100 MG capsule Take 300 mg by mouth at bedtime.  06/08/14  Yes Historical Provider, MD  glipiZIDE (GLUCOTROL XL) 2.5 MG 24 hr tablet Take 2.5 mg by mouth 2 (two) times daily.    Yes Historical Provider, MD  Insulin Glargine (LANTUS SOLOSTAR [place]) Inject 30 Units into the skin daily.    Yes Historical Provider, MD  lisinopril (PRINIVIL,ZESTRIL) 20 MG tablet Take 20 mg by mouth daily.     Yes Historical Provider, MD  loratadine (CLARITIN) 10 MG tablet Take 10 mg by mouth daily.   Yes Historical Provider, MD  metFORMIN (GLUCOPHAGE) 1000 MG tablet Take 1,000 mg by mouth 2 (two) times daily with a meal.   Yes Historical Provider, MD  metoprolol tartrate (LOPRESSOR) 25 MG tablet Take 25 mg by mouth 2 (two) times daily.   Yes Historical Provider, MD  nitroGLYCERIN (NITROSTAT) 0.4 MG SL tablet  Place 0.4 mg under the tongue every 5 (five) minutes x 3 doses as needed.   Yes Historical Provider, MD  Pancrelipase, Lip-Prot-Amyl, (CREON) 24000 UNITS CPEP Take 3 capsules by mouth 4 (four) times daily. To take three to four times daily (PER PHARMACY RECORD)   Yes Historical Provider, MD  pantoprazole (PROTONIX) 40 MG tablet Take 1 tablet (40 mg total) by mouth daily. 11/02/13  Yes Dayna N Dunn, PA-C  spironolactone (ALDACTONE) 25 MG tablet Take 25 mg by mouth daily.     Yes Historical Provider, MD  SYMBICORT 160-4.5 MCG/ACT inhaler Inhale 2 puffs into the lungs 2 (two) times daily.  07/04/12  Yes Historical  Provider, MD   BP 146/67  Pulse 68  Temp(Src) 98 F (36.7 C) (Oral)  Resp 18  Ht 5\' 11"  (1.803 m)  Wt 197 lb 12 oz (89.7 kg)  BMI 27.59 kg/m2  SpO2 96% Physical Exam  Nursing note and vitals reviewed. Constitutional: He is oriented to person, place, and time. He appears well-developed and well-nourished. No distress.  HENT:  Head: Normocephalic and atraumatic.  Mouth/Throat: Oropharynx is clear and moist.  Eyes: EOM are normal. Pupils are equal, round, and reactive to light.  Neck: Normal range of motion. Neck supple.  Cardiovascular: Normal rate and regular rhythm.   Pulmonary/Chest: Effort normal. No respiratory distress. He has wheezes. He has rales (expiratory wheezing throughoutcrackles in bilateral bases.).  Abdominal: Soft. Bowel sounds are normal. He exhibits no distension and no mass. There is no tenderness. There is no rebound and no guarding.  Musculoskeletal: Normal range of motion. He exhibits edema. He exhibits no tenderness.  3+ edema right lower extremity. 2+ edema left lower extremity. Multiple circular eschars on the left lower extremity. The left lower extremity is mildly more erythematous than the right. There is mild warmth. Slow capillary refill. Unable to palpate distal pulses on the left. Faint dorsalis pedis and posterior tibial on the right. All pulses in  bilateral lower extremities are appreciated by Doppler.  Neurological: He is alert and oriented to person, place, and time.  Moves all extremities without deficit. Sensation is grossly intact.  Skin: Skin is warm and dry. No rash noted. There is erythema.  Psychiatric: He has a normal mood and affect. His behavior is normal.    ED Course  Procedures (including critical care time) Labs Review Labs Reviewed  CBC WITH DIFFERENTIAL - Abnormal; Notable for the following:    RBC 3.41 (*)    Hemoglobin 10.1 (*)    HCT 31.5 (*)    Neutrophils Relative % 79 (*)    Lymphocytes Relative 10 (*)    All other components within normal limits  COMPREHENSIVE METABOLIC PANEL - Abnormal; Notable for the following:    Glucose, Bld 210 (*)    Albumin 2.7 (*)    Total Bilirubin <0.2 (*)    GFR calc non Af Amer 70 (*)    GFR calc Af Amer 81 (*)    All other components within normal limits  URINALYSIS, ROUTINE W REFLEX MICROSCOPIC - Abnormal; Notable for the following:    APPearance CLOUDY (*)    Glucose, UA 500 (*)    Hgb urine dipstick MODERATE (*)    Protein, ur >300 (*)    All other components within normal limits  PRO B NATRIURETIC PEPTIDE - Abnormal; Notable for the following:    Pro B Natriuretic peptide (BNP) 1466.0 (*)    All other components within normal limits  TROPONIN I - Abnormal; Notable for the following:    Troponin I 0.41 (*)    All other components within normal limits  TROPONIN I - Abnormal; Notable for the following:    Troponin I 0.93 (*)    All other components within normal limits  TROPONIN I - Abnormal; Notable for the following:    Troponin I 1.47 (*)    All other components within normal limits  TROPONIN I - Abnormal; Notable for the following:    Troponin I 2.07 (*)    All other components within normal limits  TROPONIN I - Abnormal; Notable for the following:    Troponin I 1.92 (*)  All other components within normal limits  GLUCOSE, CAPILLARY - Abnormal;  Notable for the following:    Glucose-Capillary 208 (*)    All other components within normal limits  GLUCOSE, CAPILLARY - Abnormal; Notable for the following:    Glucose-Capillary 182 (*)    All other components within normal limits  GLUCOSE, CAPILLARY - Abnormal; Notable for the following:    Glucose-Capillary 209 (*)    All other components within normal limits  BASIC METABOLIC PANEL - Abnormal; Notable for the following:    Glucose, Bld 263 (*)    GFR calc non Af Amer 70 (*)    GFR calc Af Amer 81 (*)    All other components within normal limits  GLUCOSE, CAPILLARY - Abnormal; Notable for the following:    Glucose-Capillary 278 (*)    All other components within normal limits  GLUCOSE, CAPILLARY - Abnormal; Notable for the following:    Glucose-Capillary 162 (*)    All other components within normal limits  GLUCOSE, CAPILLARY - Abnormal; Notable for the following:    Glucose-Capillary 269 (*)    All other components within normal limits  CBG MONITORING, ED - Abnormal; Notable for the following:    Glucose-Capillary 185 (*)    All other components within normal limits  MRSA PCR SCREENING  URINE MICROSCOPIC-ADD ON  CK    Imaging Review US Aorta  07/10/2014   CLINICAL DATA:  Coronary artery disease. Peripheral vascular disease. Abdominal aortic aneurysm.  EXAM: ULTRASOUND OF ABDOMINAL AORTA  TECHNIQUE: Ultrasound examination of the abdominal aorta was performed to evaluate for abdominal aortic aneurysm.  COMPARISON:  CT lumbar spine 03/13/2014  FINDINGS: Abdominal Aorta  No aneurysm identified.  Maximum AP  Diameter:  1.9 cm  Maximum TRV  Diameter: 2 cm  Atherosclerotic calcification is noted. Iliac arteries are obscured.  IMPRESSION: No abdominal aortic aneurysm. Maximal AP diameter is 1.9 cm. Normal caliber of the abdominal aorta.   Electronically Signed   By: Lucienne Capers M.D.   On: 07/10/2014 06:06   Dg Chest Port 1 View  07/10/2014   CLINICAL DATA:  Shortness of breath.  Diabetes. Hypertension. Coronary artery disease.  EXAM: PORTABLE CHEST - 1 VIEW  COMPARISON:  06/28/2014  FINDINGS: The heart size and mediastinal contours are within normal limits. Both lungs are clear. No evidence of pleural effusion. Prior CABG again noted.  IMPRESSION: No active disease.   Electronically Signed   By: Earle Gell M.D.   On: 07/10/2014 00:26     EKG Interpretation None      Date: 07/10/2014  Rate: 105  Rhythm: sinus tachycardia  QRS Axis: normal  Intervals: QRS prolonged  ST/T Wave abnormalities: nonspecific ST/T changes  Conduction Disutrbances:nonspecific intraventricular conduction delay  Narrative Interpretation:   Old EKG Reviewed: unchanged  CRITICAL CARE Performed by: Lita Mains, Demarri Elie Total critical care time: 45 min Critical care time was exclusive of separately billable procedures and treating other patients. Critical care was necessary to treat or prevent imminent or life-threatening deterioration. Critical care was time spent personally by me on the following activities: development of treatment plan with patient and/or surrogate as well as nursing, discussions with consultants, evaluation of patient's response to treatment, examination of patient, obtaining history from patient or surrogate, ordering and performing treatments and interventions, ordering and review of laboratory studies, ordering and review of radiographic studies, pulse oximetry and re-evaluation of patient's condition.  MDM   Final diagnoses:  NSTEMI (non-ST elevated myocardial infarction)  Peripheral vascular  disease  COPD exacerbation  Hypervolemia, unspecified hypervolemia type   Patient was anesthetized with multiple comorbidities. Presenting complaint most consistent with claudication due to his peripheral vascular disease. He has Dopplerable pulses in both extremities. He will need to followup with a surgeon but do not believe that emergent intervention is necessary this point. He  does appear to be fluid overloaded with wheezing. Give Lasix and nebulized treatments and reevaluate. Anticipate discharge home. EKG appears essentially unchanged from previous. Patient denies having any chest pain at this time.   Discussed EKG and initial troponin elevation with Dr. Haroldine Laws. Suggested repeat troponin. If there is no elevation in the patient is cleared from a cardiac to followup as an outpatient. Patient continues to have wheezing despite initial breathing treatment. Brief hypoxia requiring nasal cannula oxygen. I hesitate to give a second breathing treatment until second troponin is repeated do to concern for demand ischemia.  Repeat troponin lab draw was lost and had to be redrawn creating delay.  Discussed continued rise in troponin with Dr. Claiborne Billings. Cardiology will follow and get recommendations. Advised admitting to hospitalist service given other comorbidities.    Julianne Rice, MD 07/11/14 [phone]

## 2014-07-10 ENCOUNTER — Inpatient Hospital Stay (HOSPITAL_COMMUNITY): Payer: Medicare Other

## 2014-07-10 ENCOUNTER — Encounter (HOSPITAL_COMMUNITY): Payer: Self-pay | Admitting: Radiology

## 2014-07-10 DIAGNOSIS — I714 Abdominal aortic aneurysm, without rupture, unspecified: Secondary | ICD-10-CM | POA: Diagnosis present

## 2014-07-10 DIAGNOSIS — L97929 Non-pressure chronic ulcer of unspecified part of left lower leg with unspecified severity: Secondary | ICD-10-CM | POA: Diagnosis present

## 2014-07-10 DIAGNOSIS — I447 Left bundle-branch block, unspecified: Secondary | ICD-10-CM

## 2014-07-10 DIAGNOSIS — I7090 Unspecified atherosclerosis: Secondary | ICD-10-CM

## 2014-07-10 DIAGNOSIS — E1059 Type 1 diabetes mellitus with other circulatory complications: Secondary | ICD-10-CM

## 2014-07-10 DIAGNOSIS — I70219 Atherosclerosis of native arteries of extremities with intermittent claudication, unspecified extremity: Secondary | ICD-10-CM

## 2014-07-10 DIAGNOSIS — Z794 Long term (current) use of insulin: Secondary | ICD-10-CM | POA: Diagnosis not present

## 2014-07-10 DIAGNOSIS — Z951 Presence of aortocoronary bypass graft: Secondary | ICD-10-CM | POA: Diagnosis not present

## 2014-07-10 DIAGNOSIS — I509 Heart failure, unspecified: Secondary | ICD-10-CM | POA: Diagnosis present

## 2014-07-10 DIAGNOSIS — I7092 Chronic total occlusion of artery of the extremities: Secondary | ICD-10-CM | POA: Diagnosis present

## 2014-07-10 DIAGNOSIS — Z881 Allergy status to other antibiotic agents status: Secondary | ICD-10-CM | POA: Diagnosis not present

## 2014-07-10 DIAGNOSIS — I8500 Esophageal varices without bleeding: Secondary | ICD-10-CM | POA: Diagnosis present

## 2014-07-10 DIAGNOSIS — K219 Gastro-esophageal reflux disease without esophagitis: Secondary | ICD-10-CM | POA: Diagnosis present

## 2014-07-10 DIAGNOSIS — E138 Other specified diabetes mellitus with unspecified complications: Secondary | ICD-10-CM

## 2014-07-10 DIAGNOSIS — E785 Hyperlipidemia, unspecified: Secondary | ICD-10-CM | POA: Diagnosis present

## 2014-07-10 DIAGNOSIS — L97909 Non-pressure chronic ulcer of unspecified part of unspecified lower leg with unspecified severity: Secondary | ICD-10-CM | POA: Diagnosis present

## 2014-07-10 DIAGNOSIS — Z7982 Long term (current) use of aspirin: Secondary | ICD-10-CM | POA: Diagnosis not present

## 2014-07-10 DIAGNOSIS — L97209 Non-pressure chronic ulcer of unspecified calf with unspecified severity: Secondary | ICD-10-CM

## 2014-07-10 DIAGNOSIS — F17200 Nicotine dependence, unspecified, uncomplicated: Secondary | ICD-10-CM | POA: Diagnosis present

## 2014-07-10 DIAGNOSIS — E1159 Type 2 diabetes mellitus with other circulatory complications: Secondary | ICD-10-CM | POA: Diagnosis present

## 2014-07-10 DIAGNOSIS — J441 Chronic obstructive pulmonary disease with (acute) exacerbation: Secondary | ICD-10-CM | POA: Diagnosis present

## 2014-07-10 DIAGNOSIS — E1142 Type 2 diabetes mellitus with diabetic polyneuropathy: Secondary | ICD-10-CM | POA: Diagnosis present

## 2014-07-10 DIAGNOSIS — I798 Other disorders of arteries, arterioles and capillaries in diseases classified elsewhere: Secondary | ICD-10-CM | POA: Diagnosis present

## 2014-07-10 DIAGNOSIS — Z7902 Long term (current) use of antithrombotics/antiplatelets: Secondary | ICD-10-CM | POA: Diagnosis not present

## 2014-07-10 DIAGNOSIS — R7989 Other specified abnormal findings of blood chemistry: Secondary | ICD-10-CM

## 2014-07-10 DIAGNOSIS — I10 Essential (primary) hypertension: Secondary | ICD-10-CM | POA: Diagnosis present

## 2014-07-10 DIAGNOSIS — G589 Mononeuropathy, unspecified: Secondary | ICD-10-CM | POA: Diagnosis present

## 2014-07-10 DIAGNOSIS — L97309 Non-pressure chronic ulcer of unspecified ankle with unspecified severity: Secondary | ICD-10-CM

## 2014-07-10 DIAGNOSIS — I5023 Acute on chronic systolic (congestive) heart failure: Secondary | ICD-10-CM | POA: Diagnosis present

## 2014-07-10 DIAGNOSIS — I2510 Atherosclerotic heart disease of native coronary artery without angina pectoris: Secondary | ICD-10-CM

## 2014-07-10 DIAGNOSIS — J9600 Acute respiratory failure, unspecified whether with hypoxia or hypercapnia: Secondary | ICD-10-CM | POA: Diagnosis present

## 2014-07-10 DIAGNOSIS — E1149 Type 2 diabetes mellitus with other diabetic neurological complication: Secondary | ICD-10-CM | POA: Diagnosis present

## 2014-07-10 DIAGNOSIS — I252 Old myocardial infarction: Secondary | ICD-10-CM | POA: Diagnosis not present

## 2014-07-10 DIAGNOSIS — M79609 Pain in unspecified limb: Secondary | ICD-10-CM | POA: Diagnosis present

## 2014-07-10 DIAGNOSIS — I214 Non-ST elevation (NSTEMI) myocardial infarction: Secondary | ICD-10-CM

## 2014-07-10 DIAGNOSIS — I248 Other forms of acute ischemic heart disease: Secondary | ICD-10-CM | POA: Diagnosis present

## 2014-07-10 DIAGNOSIS — I70229 Atherosclerosis of native arteries of extremities with rest pain, unspecified extremity: Secondary | ICD-10-CM

## 2014-07-10 DIAGNOSIS — I739 Peripheral vascular disease, unspecified: Secondary | ICD-10-CM

## 2014-07-10 DIAGNOSIS — L98499 Non-pressure chronic ulcer of skin of other sites with unspecified severity: Secondary | ICD-10-CM | POA: Diagnosis present

## 2014-07-10 DIAGNOSIS — L97409 Non-pressure chronic ulcer of unspecified heel and midfoot with unspecified severity: Secondary | ICD-10-CM

## 2014-07-10 LAB — GLUCOSE, CAPILLARY
GLUCOSE-CAPILLARY: 209 mg/dL — AB (ref 70–99)
GLUCOSE-CAPILLARY: 162 mg/dL — AB (ref 70–99)
Glucose-Capillary: 208 mg/dL — ABNORMAL HIGH (ref 70–99)
Glucose-Capillary: 182 mg/dL — ABNORMAL HIGH (ref 70–99)
Glucose-Capillary: 278 mg/dL — ABNORMAL HIGH (ref 70–99)

## 2014-07-10 LAB — MRSA PCR SCREENING: MRSA by PCR: NEGATIVE

## 2014-07-10 LAB — PRO B NATRIURETIC PEPTIDE: Pro B Natriuretic peptide (BNP): 1466 pg/mL — ABNORMAL HIGH (ref 0–125)

## 2014-07-10 LAB — CK: CK TOTAL: 222 U/L (ref 7–232)

## 2014-07-10 LAB — TROPONIN I
Troponin I: 0.41 ng/mL (ref <0.30)
Troponin I: 0.93 ng/mL (ref <0.30)
Troponin I: 1.47 ng/mL (ref <0.30)
Troponin I: 1.92 ng/mL (ref <0.30)
Troponin I: 2.07 ng/mL (ref <0.30)

## 2014-07-10 MED ORDER — LORATADINE 10 MG PO TABS
10.0000 mg | ORAL_TABLET | Freq: Every day | ORAL | Status: DC
  Administered 2014-07-10 – 2014-07-14 (×5): 10 mg via ORAL
  Filled 2014-07-10 (×5): qty 1

## 2014-07-10 MED ORDER — INSULIN GLARGINE 100 UNIT/ML ~~LOC~~ SOLN
15.0000 [IU] | Freq: Two times a day (BID) | SUBCUTANEOUS | Status: DC
  Administered 2014-07-10: 15 [IU] via SUBCUTANEOUS
  Filled 2014-07-10 (×2): qty 0.15

## 2014-07-10 MED ORDER — ASPIRIN EC 325 MG PO TBEC
325.0000 mg | DELAYED_RELEASE_TABLET | Freq: Every morning | ORAL | Status: DC
  Administered 2014-07-10 – 2014-07-14 (×5): 325 mg via ORAL
  Filled 2014-07-10 (×5): qty 1

## 2014-07-10 MED ORDER — GLIPIZIDE ER 2.5 MG PO TB24: 2.5000 mg | ORAL_TABLET | Freq: Two times a day (BID) | ORAL | Status: DC

## 2014-07-10 MED ORDER — LEVALBUTEROL HCL 0.63 MG/3ML IN NEBU
0.6300 mg | INHALATION_SOLUTION | RESPIRATORY_TRACT | Status: DC | PRN
  Administered 2014-07-11: 0.63 mg via RESPIRATORY_TRACT
  Filled 2014-07-10 (×3): qty 3

## 2014-07-10 MED ORDER — METOPROLOL TARTRATE 25 MG PO TABS
25.0000 mg | ORAL_TABLET | Freq: Two times a day (BID) | ORAL | Status: DC
  Administered 2014-07-10 – 2014-07-14 (×9): 25 mg via ORAL
  Filled 2014-07-10 (×10): qty 1

## 2014-07-10 MED ORDER — HYDROCODONE-ACETAMINOPHEN 5-325 MG PO TABS
1.0000 | ORAL_TABLET | ORAL | Status: DC | PRN
  Administered 2014-07-11 – 2014-07-12 (×2): 1 via ORAL
  Filled 2014-07-10 (×2): qty 1

## 2014-07-10 MED ORDER — HEPARIN SODIUM (PORCINE) 5000 UNIT/ML IJ SOLN
5000.0000 [IU] | Freq: Three times a day (TID) | INTRAMUSCULAR | Status: DC
  Administered 2014-07-10 – 2014-07-13 (×10): 5000 [IU] via SUBCUTANEOUS
  Filled 2014-07-10 (×16): qty 1

## 2014-07-10 MED ORDER — SODIUM CHLORIDE 0.9 % IJ SOLN
3.0000 mL | Freq: Two times a day (BID) | INTRAMUSCULAR | Status: DC
  Administered 2014-07-10 – 2014-07-11 (×2): 3 mL via INTRAVENOUS

## 2014-07-10 MED ORDER — IPRATROPIUM-ALBUTEROL 18-103 MCG/ACT IN AERO: 1.0000 | INHALATION_SPRAY | Freq: Four times a day (QID) | RESPIRATORY_TRACT | Status: DC | PRN

## 2014-07-10 MED ORDER — SODIUM CHLORIDE 0.9 % IV SOLN
INTRAVENOUS | Status: DC
  Administered 2014-07-10: 07:00:00 via INTRAVENOUS

## 2014-07-10 MED ORDER — CLOPIDOGREL BISULFATE 75 MG PO TABS
75.0000 mg | ORAL_TABLET | Freq: Every day | ORAL | Status: DC
  Administered 2014-07-10 – 2014-07-14 (×5): 75 mg via ORAL
  Filled 2014-07-10 (×5): qty 1

## 2014-07-10 MED ORDER — METHYLPREDNISOLONE SODIUM SUCC 125 MG IJ SOLR
60.0000 mg | Freq: Two times a day (BID) | INTRAMUSCULAR | Status: DC
  Administered 2014-07-10 (×2): 60 mg via INTRAVENOUS
  Filled 2014-07-10 (×3): qty 0.96
  Filled 2014-07-10: qty 2
  Filled 2014-07-10 (×4): qty 0.96

## 2014-07-10 MED ORDER — PANTOPRAZOLE SODIUM 40 MG PO TBEC
40.0000 mg | DELAYED_RELEASE_TABLET | Freq: Every day | ORAL | Status: DC
  Administered 2014-07-10 – 2014-07-14 (×5): 40 mg via ORAL
  Filled 2014-07-10 (×5): qty 1

## 2014-07-10 MED ORDER — SPIRONOLACTONE 25 MG PO TABS
25.0000 mg | ORAL_TABLET | Freq: Every day | ORAL | Status: DC
Start: 2014-07-10 — End: 2014-07-10

## 2014-07-10 MED ORDER — FUROSEMIDE 40 MG PO TABS
40.0000 mg | ORAL_TABLET | Freq: Every day | ORAL | Status: DC
  Filled 2014-07-10: qty 1

## 2014-07-10 MED ORDER — PANCRELIPASE (LIP-PROT-AMYL) 36000-114000 UNITS PO CPEP
72000.0000 [IU] | ORAL_CAPSULE | Freq: Three times a day (TID) | ORAL | Status: DC
  Administered 2014-07-10 – 2014-07-14 (×16): 72000 [IU] via ORAL
  Filled 2014-07-10 (×19): qty 2

## 2014-07-10 MED ORDER — INSULIN GLARGINE 100 UNIT/ML ~~LOC~~ SOLN
20.0000 [IU] | Freq: Every day | SUBCUTANEOUS | Status: DC
  Administered 2014-07-10: 20 [IU] via SUBCUTANEOUS
  Filled 2014-07-10: qty 0.2

## 2014-07-10 MED ORDER — NITROGLYCERIN 0.4 MG SL SUBL: 0.4000 mg | SUBLINGUAL_TABLET | SUBLINGUAL | Status: DC | PRN

## 2014-07-10 MED ORDER — LISINOPRIL 20 MG PO TABS
20.0000 mg | ORAL_TABLET | Freq: Every day | ORAL | Status: DC
  Administered 2014-07-10 – 2014-07-13 (×4): 20 mg via ORAL
  Filled 2014-07-10 (×5): qty 1

## 2014-07-10 MED ORDER — INSULIN ASPART 100 UNIT/ML ~~LOC~~ SOLN
0.0000 [IU] | SUBCUTANEOUS | Status: DC
  Administered 2014-07-10: 2 [IU] via SUBCUTANEOUS
  Administered 2014-07-10 (×2): 3 [IU] via SUBCUTANEOUS
  Administered 2014-07-10: 2 [IU] via SUBCUTANEOUS
  Administered 2014-07-10: 3 [IU] via SUBCUTANEOUS
  Administered 2014-07-11: 5 [IU] via SUBCUTANEOUS
  Administered 2014-07-11: 3 [IU] via SUBCUTANEOUS
  Administered 2014-07-11: 7 [IU] via SUBCUTANEOUS
  Administered 2014-07-11: 5 [IU] via SUBCUTANEOUS

## 2014-07-10 MED ORDER — CETYLPYRIDINIUM CHLORIDE 0.05 % MT LIQD
7.0000 mL | Freq: Two times a day (BID) | OROMUCOSAL | Status: DC
  Administered 2014-07-10 – 2014-07-11 (×3): 7 mL via OROMUCOSAL

## 2014-07-10 MED ORDER — BUDESONIDE-FORMOTEROL FUMARATE 160-4.5 MCG/ACT IN AERO
2.0000 | INHALATION_SPRAY | Freq: Two times a day (BID) | RESPIRATORY_TRACT | Status: DC
  Administered 2014-07-10 – 2014-07-14 (×8): 2 via RESPIRATORY_TRACT
  Filled 2014-07-10: qty 6

## 2014-07-10 MED ORDER — FUROSEMIDE 20 MG PO TABS: 40.0000 mg | ORAL_TABLET | Freq: Every day | ORAL | Status: DC

## 2014-07-10 MED ORDER — ATORVASTATIN CALCIUM 40 MG PO TABS
40.0000 mg | ORAL_TABLET | Freq: Every day | ORAL | Status: DC
  Administered 2014-07-10 – 2014-07-14 (×5): 40 mg via ORAL
  Filled 2014-07-10 (×5): qty 1

## 2014-07-10 MED ORDER — FENOFIBRATE 160 MG PO TABS
160.0000 mg | ORAL_TABLET | Freq: Every day | ORAL | Status: DC
  Administered 2014-07-10 – 2014-07-14 (×5): 160 mg via ORAL
  Filled 2014-07-10 (×8): qty 1

## 2014-07-10 MED ORDER — FUROSEMIDE 10 MG/ML IJ SOLN
40.0000 mg | Freq: Once | INTRAMUSCULAR | Status: AC
  Administered 2014-07-10: 40 mg via INTRAVENOUS
  Filled 2014-07-10: qty 4

## 2014-07-10 MED ORDER — GABAPENTIN 300 MG PO CAPS
300.0000 mg | ORAL_CAPSULE | Freq: Every day | ORAL | Status: DC
  Administered 2014-07-10 – 2014-07-13 (×4): 300 mg via ORAL
  Filled 2014-07-10 (×5): qty 1

## 2014-07-10 MED ORDER — LORAZEPAM 2 MG/ML IJ SOLN
1.0000 mg | Freq: Once | INTRAMUSCULAR | Status: AC | PRN
Start: 2014-07-10 — End: 2014-07-10
  Filled 2014-07-10: qty 1

## 2014-07-10 MED ORDER — HYDROCODONE-ACETAMINOPHEN 5-325 MG PO TABS
2.0000 | ORAL_TABLET | Freq: Once | ORAL | Status: AC
  Administered 2014-07-10: 2 via ORAL
  Filled 2014-07-10: qty 2

## 2014-07-10 MED ORDER — PANCRELIPASE (LIP-PROT-AMYL) 24000-76000 UNITS PO CPEP: 3.0000 | ORAL_CAPSULE | Freq: Four times a day (QID) | ORAL | Status: DC

## 2014-07-10 MED ORDER — METFORMIN HCL 500 MG PO TABS
1000.0000 mg | ORAL_TABLET | Freq: Two times a day (BID) | ORAL | Status: DC
  Administered 2014-07-10: 1000 mg via ORAL
  Filled 2014-07-10 (×3): qty 2

## 2014-07-10 MED ORDER — IOHEXOL 350 MG/ML SOLN
100.0000 mL | Freq: Once | INTRAVENOUS | Status: AC | PRN
  Administered 2014-07-10: 100 mL via INTRAVENOUS

## 2014-07-10 MED ORDER — LEVALBUTEROL HCL 0.63 MG/3ML IN NEBU: 0.6300 mg | INHALATION_SOLUTION | RESPIRATORY_TRACT | Status: DC | PRN

## 2014-07-10 MED ORDER — LEVALBUTEROL HCL 0.63 MG/3ML IN NEBU
0.6300 mg | INHALATION_SOLUTION | Freq: Four times a day (QID) | RESPIRATORY_TRACT | Status: DC
  Administered 2014-07-10 – 2014-07-13 (×14): 0.63 mg via RESPIRATORY_TRACT
  Filled 2014-07-10 (×24): qty 3

## 2014-07-10 NOTE — Progress Notes (Signed)
Patient ID: Stephen [name], male   DOB: 06-01-1941, 73 y.o.   MRN: [id number]  Patient seen by our team early this AM. I spoke with Dr. Olevia Bowens. Vascular surgery has been consulted.  Daryel November, MD

## 2014-07-10 NOTE — Progress Notes (Signed)
*  PRELIMINARY RESULTS* Vascular Ultrasound Lower Extremity Arterial Duplex has been completed.    Bilateral external iliac arteries exhibit monophasic flow, suggestive of aorto-iliac disease.   There is significant atherosclerotic plaque visualized in bilateral lower extremity arteries.  Right There is a significant decrease in velocity between the right proximal femoral artery and the mid femoral artery, with collateralization off of the proximal femoral artery. The waveforms of the right mid femoral, distal femoral, popliteal, posterior tibial, peroneal, anterior tibial, and dorsalis pedis arteries exhibit dampened monophasic and severely dampened monophasic flow.  Left A portion of the left proximal femoral artery appears to be occluded with no internal flow noted. The remaining segments of the left femoral artery exhibit tardus parvus flow. The left distal posterior tibial, and left proximal anterior tibial arteries also demonstrate tardus parvus flow. The left popliteal, posterior tibial, peroneal, anterior tibial, and dorsalis pedis arteries exhibit dampened monophasic and severely dampened monophasic flow.  07/10/2014 11:21 AM Maudry Mayhew, RVT, RDCS, RDMS

## 2014-07-10 NOTE — Consult Note (Addendum)
Reason for Consult: abnormal ECG, low level troponin Primary Cardiologist: not currently following with anyone Referring Physician: Dr. Lazarus [name] is an 73 y.o. male.  HPI: Stephen [name] is a 69 yo man with PMH of T2DM, PVD, dyslipidemia, CAD with CABG 6/11, COPD with continued tobacco use (multiple packs per day) who comes in with left leg pain. Cardiology actually consulted for troponin of 0.4 and ECG with LBBB. On exam, Stephen [name] has no chest pain and stable shortness of breath. He is accompanied by her son. He is continuing to smoke but says he is quitting right now. He has been to multiple hospitals in the area regarding his left leg pain but nothing has improved it. He had dopplerable pulses in right and left posterior tibialis and dorsalis pedis. He has no fever/chills/sick contacts. The mild erythema and swelling are stable per Stephen [name].   Past Medical History  Diagnosis Date  . Diabetes mellitus   . Hypertension   . Pancreatitis   . Neuropathy   . Hyperlipemia   . Peripheral vascular disease     a. 10/2013: severely abnormal ABIs, for OP evaluation.  [name] GERD (gastroesophageal reflux disease)   . S/P colonoscopy July 2003    hyperplastic polyp, diverticulosis, anal papilla and internal hemorrhoids r  . S/P endoscopy 2003    Dr. Braulio Bosch: erosive reflux esophagitis , PUD  . PUD (peptic ulcer disease)     diagnosed via EGD by Dr. Braulio Bosch at [place]  . Diverticulosis   . Hemorrhoids   . Coronary artery disease      a. BMS to LAD 1999. b. HSRA to LAD and stent to RCA 2007. c. NSTEMI/CABG x 3 in 04/2010. d. NSTEMI 10/2013 - secondary to demand ischemia in setting of COPD exacerbation with severe underlying CAD - cath with 3/3 patent grafts, for medical therapy.   [name] COPD (chronic obstructive pulmonary disease)     a. Ongoing tobacco abuse.  . Arthritis   . Neutropenia     a. Dx 10/2013 on labs - instructed to f/u PCP.  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   . Ischemic cardiomyopathy     a. previously EF 30% in 2011. b. 10/2013: EF 45-50% by echo.  . Alcoholism     a. Remote alcoholism  . 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    Past Surgical History  Procedure Laterality Date  . Coronary artery bypass graft  June 2011    X3  . Right inguinal hernia repair    . Cholecystectomy      3-4 years ago  . Appendectomy      age 52  . Cataracts    . Colonoscopy  05/12/2002    Dr. [name]- diverticulosis, anal papilla, internal hemorrhoids, rectal polyps  . [name] dilation  04/16/2012    Schatzki's ring-status post dilation and disruption/ as described above. Grade 1 esophageal varices. Small hiatal hernia.  [name] dilation  04/16/2012    Procedure: [name] DILATION;  Surgeon: Daneil Dolin, MD;  [facility];  Service: Endoscopy;  Laterality: N/A;  . Colonoscopy  08/10/2012    Procedure: COLONOSCOPY;  Surgeon: Daneil Dolin, MD;  [facility];  Service: Endoscopy;  Laterality: N/A;  9:45 needs 30 mins extra /have Glucagon on hand    Family History  Problem Relation Age of Onset  . Coronary artery disease Mother   . Coronary artery disease Father   . Diabetes Sister   . Suicidality Brother   . Cirrhosis Brother   . Colon cancer Neg Hx     Social History:  reports that he has been smoking Cigarettes.  He has a 94.5 pack-year smoking history. He uses smokeless tobacco. He reports that he does not drink alcohol or use illicit drugs.  Allergies:  Allergies  Allergen Reactions  . Levaquin [Levofloxacin] Hives    Medications: I have reviewed the patient's current medications. Prior to Admission:  (Not in a hospital admission) Scheduled:  Results for orders placed during the hospital encounter of 07/09/14 (from the past 48 hour(s))  CBC WITH DIFFERENTIAL     Status: Abnormal   Collection Time     07/09/14  9:13 PM      Result Value Ref Range   WBC 8.2  4.0 - 10.5 K/uL   RBC 3.41 (*) 4.22 - 5.81 MIL/uL   Hemoglobin 10.1 (*) 13.0 - 17.0 g/dL   HCT 31.5 (*) 39.0 - 52.0 %   MCV 92.4  78.0 - 100.0 fL   MCH 29.6  26.0 - 34.0 pg   MCHC 32.1  30.0 - 36.0 g/dL   RDW 15.5  11.5 - 15.5 %   Platelets 253  150 - 400 K/uL   Neutrophils Relative % 79 (*) 43 - 77 %   Neutro Abs 6.5  1.7 - 7.7 K/uL   Lymphocytes Relative 10 (*) 12 - 46 %   Lymphs Abs 0.8  0.7 - 4.0 K/uL   Monocytes Relative 10  3 - 12 %   Monocytes Absolute 0.9  0.1 - 1.0 K/uL   Eosinophils Relative 1  0 - 5 %   Eosinophils Absolute 0.1  0.0 - 0.7 K/uL   Basophils Relative 0  0 - 1 %   Basophils Absolute 0.0  0.0 - 0.1 K/uL  COMPREHENSIVE METABOLIC PANEL     Status: Abnormal   Collection Time    07/09/14  9:13 PM      Result Value Ref Range   Sodium 139  137 - 147 mEq/L   Potassium 4.0  3.7 - 5.3 mEq/L   Chloride 99  96 - 112 mEq/L   CO2 28  19 - 32 mEq/L   Glucose, Bld 210 (*) 70 - 99 mg/dL   BUN 15  6 - 23 mg/dL   Creatinine, Ser 1.04  0.50 - 1.35 mg/dL   Calcium 9.3  8.4 - 10.5 mg/dL   Total Protein 6.7  6.0 - 8.3 g/dL   Albumin 2.7 (*) 3.5 - 5.2 g/dL   AST 20  0 - 37 U/L   ALT 10  0 - 53 U/L   Alkaline Phosphatase 44  39 - 117 U/L   Total Bilirubin <0.2 (*) 0.3 - 1.2 mg/dL   GFR calc non Af Amer 70 (*) >90 mL/min   GFR calc Af Amer 81 (*) >90 mL/min   Comment: (NOTE)     The eGFR has been calculated using the CKD EPI equation.     This calculation has not been validated in all clinical situations.     eGFR's persistently <90 mL/min signify possible Chronic Kidney     Disease.   Anion gap 12  5 - 15  CBG MONITORING, ED     Status: Abnormal   Collection Time    07/09/14  9:14  PM      Result Value Ref Range   Glucose-Capillary 185 (*) 70 - 99 mg/dL  URINALYSIS, ROUTINE W REFLEX MICROSCOPIC     Status: Abnormal   Collection Time    07/09/14  9:25 PM      Result Value Ref Range   Color, Urine YELLOW   YELLOW   APPearance CLOUDY (*) CLEAR   Specific Gravity, Urine 1.020  1.005 - 1.030   pH 5.5  5.0 - 8.0   Glucose, UA 500 (*) NEGATIVE mg/dL   Hgb urine dipstick MODERATE (*) NEGATIVE   Bilirubin Urine NEGATIVE  NEGATIVE   Ketones, ur NEGATIVE  NEGATIVE mg/dL   Protein, ur >300 (*) NEGATIVE mg/dL   Urobilinogen, UA 0.2  0.0 - 1.0 mg/dL   Nitrite NEGATIVE  NEGATIVE   Leukocytes, UA NEGATIVE  NEGATIVE  URINE MICROSCOPIC-ADD ON     Status: None   Collection Time    07/09/14  9:25 PM      Result Value Ref Range   Squamous Epithelial / LPF RARE  RARE   RBC / HPF 3-6  <3 RBC/hpf   Bacteria, UA RARE  RARE   Urine-Other RARE YEAST    PRO B NATRIURETIC PEPTIDE     Status: Abnormal   Collection Time    07/09/14 11:48 PM      Result Value Ref Range   Pro B Natriuretic peptide (BNP) 1466.0 (*) 0 - 125 pg/mL  TROPONIN I     Status: Abnormal   Collection Time    07/09/14 11:48 PM      Result Value Ref Range   Troponin I 0.41 (*) <0.30 ng/mL   Comment:            Due to the release kinetics of cTnI,     a negative result within the first hours     of the onset of symptoms does not rule out     myocardial infarction with certainty.     If myocardial infarction is still suspected,     repeat the test at appropriate intervals.     CRITICAL RESULT CALLED TO, READ BACK BY AND VERIFIED WITH:     BREWER E,RN 07/10/14 0026 [facility]    Dg Chest Port 1 View  07/10/2014   CLINICAL DATA:  Shortness of breath. Diabetes. Hypertension. Coronary artery disease.  EXAM: PORTABLE CHEST - 1 VIEW  COMPARISON:  06/28/2014  FINDINGS: The heart size and mediastinal contours are within normal limits. Both lungs are clear. No evidence of pleural effusion. Prior CABG again noted.  IMPRESSION: No active disease.   Electronically Signed   By: Earle Gell M.D.   On: 07/10/2014 00:26    Review of Systems  Constitutional: Positive for malaise/fatigue. Negative for fever, chills and diaphoresis.  HENT: Negative for ear  pain.   Eyes: Negative for double vision and pain.  Respiratory: Positive for cough and shortness of breath. Negative for hemoptysis.   Cardiovascular: Positive for leg swelling. Negative for chest pain and palpitations.  Gastrointestinal: Negative for nausea, vomiting and abdominal pain.  Genitourinary: Negative for dysuria and hematuria.  Musculoskeletal: Positive for joint pain. Negative for myalgias.       Leg pain left > right  Skin: Positive for itching and rash.  Neurological: Positive for sensory change. Negative for tremors and headaches.  Endo/Heme/Allergies: Negative for polydipsia.  Psychiatric/Behavioral: Positive for substance abuse. Negative for suicidal ideas and hallucinations.       Tobacco  Blood pressure 145/68, pulse 101, temperature 98.2 F (36.8 C), temperature source Oral, resp. rate 28, SpO2 89.00%. Physical Exam  Nursing note and vitals reviewed. Constitutional: He is oriented to person, place, and time. He appears well-developed and well-nourished. No distress.  HENT:  Head: Normocephalic and atraumatic.  Nose: Nose normal.  Mouth/Throat: Oropharynx is clear and moist. No oropharyngeal exudate.  Eyes: Conjunctivae and EOM are normal. Pupils are equal, round, and reactive to light. No scleral icterus.  Neck: Normal range of motion. Neck supple. JVD present.  2cm JVD above clavicle  Cardiovascular: Normal rate, regular rhythm, normal heart sounds and intact distal pulses.  Exam reveals no gallop.   No murmur heard. Respiratory: Effort normal. He has wheezes. He has rales.  GI: Soft. Bowel sounds are normal. He exhibits no distension. There is no tenderness. There is no rebound.  Musculoskeletal: Normal range of motion. He exhibits edema.  Bilateral 1+ LEE. Left leg with diffuse mild erythema and non-healing ulcers   Neurological: He is alert and oriented to person, place, and time. No cranial nerve deficit.  Skin: Skin is warm and dry. Rash noted. He is not  diaphoretic.  Psychiatric: He has a normal mood and affect. His behavior is normal. Thought content normal.  labs reviewed above; Cr 1, Trop 0.4, BNP 1466, urinalysis, pro > 300 Chest x-ray: no pulmonary edema ECG: SR, LBBB 12/14 Echo: grade I DD, EF 45-50%  Assessment/Plan: Stephen [name] is a 29 yo man with Multiple medical problems who presents with left leg pain and cardiology consulted for low level troponin and LBBB on ECG. He has no chest pain or new/progressive shortness of breath. ECG essential unchanged from 2 weeks ago. He has known CAD. His troponins are actually upwardly trending.  1. LBBB and CAD and troponin 0.4: asymptomatic. On good medications. Differential diagnosis is demand ischemia vs. ACS. He also has an elevated BNP with mildly elevated neck veins and lower extremity edema. I favor a diagnosis of demand ischemia perhaps related to left leg process. Continue to monitor; low threshold to initiate heparin gtt. Continue aspirin 81 mg, plavix 75 mg, atorvastatin, metoprolol for CAD and dyslipidemia and T2DM. He also needs to quit smoking which we discussed at length. Watch on telemetry. Trend cardia enzymes. 2. Leg pain: differential is cellulitis, non-healing ulcers, diabetic neuropathy, chronic limb ischemia among others. He has palpable pulses. He needs to see vascular surgery, stop smoking, undergo cardiac rehabilitation and continue CAD treatment above. Plan for doppler US. May need vascular consult. Discussed findings with patient.  3. CAD: as above 4. Dyslipidemia: as above 5. T2DM: as above, lifestyle modifications, statin therapy, metformin, smoking cessation 6. Tobacco abuse: smoking cessation 7. Heart failure: challenging to confirm symptoms related to HF given significant COPD; however, elevated BNP and mildly elevated JVP. Assess response to IV lasix in the ER. Likely will need gentle IV lasix.  - favor 40 mg IV lasix once daily for now 8. COPD: audible wheezes/rales with  significant smoking history. Smoking cessation as above. Continue home therapy (inhalers); defer to primary team; however, would probably favor treating as COPD exacerbation  ,  07/10/2014, 2:28 AM

## 2014-07-10 NOTE — ED Notes (Signed)
Admitting MD Dr. Alcario Drought at bedside

## 2014-07-10 NOTE — Consult Note (Addendum)
[facility] [place] HISTORY AND PHYSICAL  Requesting: Dr Olevia Bowens Reason for Consult: lower extremity ischemia History of Present Illness:  Patient is a 73 y.o. year old male who presents for evaluation of lower extremity ischemia.  Pt with 9 month history of non healing ulcers left leg.  He developed very short distance pain in left calf 3 months ago.  He can only walk about 1/2 block before pain.  This improves with 10 min rest.  He has pain in the left foot that sometimes keeps him up at night.  This has also been going on for 3 months.  He also complains of swelling in the right foot which has been present for 3 months.  He had ABI performed in December 2014 which were 0.3 left and 0.5 right.  He was admitted yesterday with COPD exacerbation.  He is a long term smoker and does wish to quit.  He had cardiac cath in Dec which showed 3 patent bypass grafts.  Also history of diabetes, hypertension, hyperlipidemia, peptic ulcer disease, remote alcohol abuse all of which are currently stable.    Past Medical History  Diagnosis Date  . Diabetes mellitus   . Hypertension   . Pancreatitis   . Neuropathy   . Hyperlipemia   . Peripheral vascular disease     a. 10/2013: severely abnormal ABIs, for OP evaluation.  [name] GERD (gastroesophageal reflux disease)   . S/P colonoscopy July 2003    hyperplastic polyp, diverticulosis, anal papilla and internal hemorrhoids r  . S/P endoscopy 2003    Dr. Braulio Bosch: erosive reflux esophagitis , PUD  . PUD (peptic ulcer disease)     diagnosed via EGD by Dr. Braulio Bosch at [place]  . Diverticulosis   . Hemorrhoids   . Coronary artery disease      a. BMS to LAD 1999. b. HSRA to LAD and stent to RCA 2007. c. NSTEMI/CABG x 3 in 04/2010. d. NSTEMI 10/2013 - secondary to demand ischemia in setting of COPD exacerbation with severe underlying CAD - cath with 3/3 patent grafts, for medical therapy.   [name] COPD (chronic obstructive pulmonary disease)     a.  Ongoing tobacco abuse.  . Arthritis   . Neutropenia     a. Dx 10/2013 on labs - instructed to f/u PCP.  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   . Ischemic cardiomyopathy     a. previously EF 30% in 2011. b. 10/2013: EF 45-50% by echo.  . Alcoholism     a. Remote alcoholism  . 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    Past Surgical History  Procedure Laterality Date  . Coronary artery bypass graft  June 2011    X3  . Right inguinal hernia repair    . Cholecystectomy      3-4 years ago  . Appendectomy      age 34  . Cataracts    . Colonoscopy  05/12/2002    Dr. [name]- diverticulosis, anal papilla, internal hemorrhoids, rectal polyps  . [name] dilation  04/16/2012    Schatzki's ring-status post dilation and disruption/ as described above. Grade 1 esophageal varices. Small hiatal hernia.  [name] dilation  04/16/2012    Procedure: [name] DILATION;  Surgeon: Daneil Dolin, MD;  [facility];  Service: Endoscopy;  Laterality: N/A;  . Colonoscopy  08/10/2012    Procedure: COLONOSCOPY;  Surgeon: Daneil Dolin, MD;  [facility];  Service: Endoscopy;  Laterality: N/A;  9:45 needs 30 mins extra /have Glucagon on hand    Social History History  Substance Use Topics  . Smoking status: Current Every Day Smoker -- 1.50 packs/day for 63 years    Types: Cigarettes  . Smokeless tobacco: Current User     Comment: on last pack now then will quit  . Alcohol Use: No     Comment: Quit 1985    Family History Family History  Problem Relation Age of Onset  . Coronary artery disease Mother   . Coronary artery disease Father   . Diabetes Sister   . Suicidality Brother   . Cirrhosis Brother   . Colon cancer Neg Hx     Allergies  Allergies  Allergen Reactions  . Levaquin [Levofloxacin] Hives     Current Facility-Administered Medications   Medication Dose Route Frequency Provider Last Rate Last Dose  . 0.9 %  sodium chloride infusion   Intravenous Continuous Etta Quill, DO 50 mL/hr at 07/10/14 0636    . antiseptic oral rinse (CPC / CETYLPYRIDINIUM CHLORIDE 0.05%) solution 7 mL  7 mL Mouth Rinse BID Etta Quill, DO   7 mL at 07/10/14 1045  . aspirin EC tablet 325 mg  325 mg Oral q morning - 10a Etta Quill, DO   325 mg at 07/10/14 1030  . atorvastatin (LIPITOR) tablet 40 mg  40 mg Oral Daily Etta Quill, DO   40 mg at 07/10/14 1030  . budesonide-formoterol (SYMBICORT) 160-4.5 MCG/ACT inhaler 2 puff  2 puff Inhalation BID Etta Quill, DO   2 puff at 07/10/14 1010  . clopidogrel (PLAVIX) tablet 75 mg  75 mg Oral Daily Etta Quill, DO   75 mg at 07/10/14 1030  . fenofibrate tablet 160 mg  160 mg Oral Q breakfast Etta Quill, DO   160 mg at 07/10/14 1031  . [START ON 07/11/2014] furosemide (LASIX) tablet 40 mg  40 mg Oral Daily Charlynne Cousins, MD      . gabapentin (NEURONTIN) capsule 300 mg  300 mg Oral QHS Etta Quill, DO      . heparin injection 5,000 Units  5,000 Units Subcutaneous 3 times per day Etta Quill, DO   5,000 Units at 07/10/14 1505  . insulin aspart (novoLOG) injection 0-9 Units  0-9 Units Subcutaneous 6 times per day Etta Quill, DO   2 Units at 07/10/14 1148  . insulin glargine (LANTUS) injection 15 Units  15 Units Subcutaneous BID Charlynne Cousins, MD      . levalbuterol [facility]) nebulizer solution 0.63 mg  0.63 mg Nebulization Q6H Etta Quill, DO   0.63 mg at 07/10/14 1451  . levalbuterol (XOPENEX) nebulizer solution 0.63 mg  0.63 mg Nebulization Q3H PRN Etta Quill, DO      . lipase/protease/amylase (CREON) capsule 72,000 Units  72,000 Units Oral TID WC & HS Charlynne Cousins, MD      . lisinopril (PRINIVIL,ZESTRIL) tablet 20 mg  20 mg Oral Daily Etta Quill, DO   20 mg at 07/10/14 1030  . loratadine (CLARITIN) tablet 10 mg  10 mg Oral Daily Etta Quill, DO   10 mg at 07/10/14 1030  . methylPREDNISolone sodium succinate (SOLU-MEDROL) 125 mg/2 mL injection 60 mg  60 mg Intravenous Q12H Etta Quill, DO  60 mg at 07/10/14 1031  . metoprolol tartrate (LOPRESSOR) tablet 25 mg  25 mg Oral BID Etta Quill, DO   25 mg at 07/10/14 1030  . nitroGLYCERIN (NITROSTAT) SL tablet 0.4 mg  0.4 mg Sublingual Q5 Min x 3 PRN Etta Quill, DO      . pantoprazole (PROTONIX) EC tablet 40 mg  40 mg Oral Daily Etta Quill, DO   40 mg at 07/10/14 1029  . sodium chloride 0.9 % injection 3 mL  3 mL Intravenous Q12H Etta Quill, DO   3 mL at 07/10/14 1032    ROS:   General:  No weight loss, Fever, chills  HEENT: No recent headaches, no nasal bleeding, no visual changes, no sore throat  Neurologic: No dizziness, blackouts, seizures. No recent symptoms of stroke or mini- stroke. No recent episodes of slurred speech, or temporary blindness.  Cardiac: No recent episodes of chest pain/pressure, no shortness of breath at rest.  + shortness of breath with exertion.  Denies history of atrial fibrillation or irregular heartbeat  Vascular: + history of rest pain in feet.  + history of claudication.  + history of non-healing ulcer, No history of DVT   Pulmonary: No home oxygen, + productive cough, no hemoptysis,  + asthma or wheezing  Musculoskeletal:  [x ] Arthritis, [x ] Low back pain,  [ ]  Joint pain  Hematologic:No history of hypercoagulable state.  No history of easy bleeding.  No history of anemia + history of neutropenia  Gastrointestinal: No hematochezia or melena,  + gastroesophageal reflux, no trouble swallowing  Urinary: [ ]  chronic Kidney disease, [ ]  on HD - [ ]  MWF or [ ]  TTHS, [ ]  Burning with urination, [ ]  Frequent urination, [ ]  Difficulty urinating;   Skin: No rashes  Psychological: No history of anxiety,  No history of depression   Physical Examination  Filed Vitals:   07/10/14 1029 07/10/14 1139 07/10/14 1452 07/10/14  1610  BP: 145/64     Pulse: 78     Temp:  98.1 F (36.7 C)  98 F (36.7 C)  TempSrc:  Oral  Oral  Resp:      Height:      Weight:      SpO2:   97%     Body mass index is 27.59 kg/(m^2).  General:  Alert and oriented, no acute distress HEENT: Normal Neck: No JVD Pulmonary: Coarse bilaterally, frequent coughing during my visit with him Cardiac: Regular Rate and Rhythm  Abdomen: Soft, non-tender, non-distended, no mass Skin: No rash, scattered punched out ulcer 5-6 left lower leg and medial left thigh Extremity Pulses:  2+ radial, brachial,absent femoral, dorsalis pedis, posterior tibial pulses bilaterally Musculoskeletal: No deformity right foot and ankle edema  Neurologic: Upper and lower extremity motor 5/5 and symmetric  DATA:   CBC    Component Value Date/Time   WBC 8.2 07/09/2014 2113   RBC 3.41* 07/09/2014 2113   HGB 10.1* 07/09/2014 2113   HCT 31.5* 07/09/2014 2113   PLT 253 07/09/2014 2113   MCV 92.4 07/09/2014 2113   MCH 29.6 07/09/2014 2113   MCHC 32.1 07/09/2014 2113   RDW 15.5 07/09/2014 2113   LYMPHSABS 0.8 07/09/2014 2113   MONOABS 0.9 07/09/2014 2113   EOSABS 0.1 07/09/2014 2113   BASOSABS 0.0 07/09/2014 2113     BMET    Component Value Date/Time   NA 139 07/09/2014 2113   K 4.0 07/09/2014 2113   CL 99  07/09/2014 2113   CO2 28 07/09/2014 2113   GLUCOSE 210* 07/09/2014 2113   BUN 15 07/09/2014 2113   CREATININE 1.04 07/09/2014 2113   CALCIUM 9.3 07/09/2014 2113   GFRNONAA 70* 07/09/2014 2113   GFRAA 81* 07/09/2014 2113    ASSESSMENT:  Severe chronic PAD, 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 CTA abdomen and pelvis with runoff to assess inflow.  He has severe COPD and his pulmonary function would have to be significantly improved to consider any intervention currently.  Will follow up with him post CT.  No reason to keep NPO from my standpoint.   Will cancel arterial duplex since CT will give [place] this info.  2. Needs right leg DVT ultrasound  Ruta Hinds,  MD [facility] Office: [phone] Pager: [phone]

## 2014-07-10 NOTE — ED Notes (Signed)
Introduced self to pt. nad noted.

## 2014-07-10 NOTE — ED Notes (Signed)
Patient transported to Ultrasound 

## 2014-07-10 NOTE — ED Notes (Signed)
Cardiology at bedside.

## 2014-07-10 NOTE — Consult Note (Signed)
WOC wound consult note Reason for Consult: Patient with chronic, non-healing wounds on left LE, both superior to and distal from patella.  All 10 wounds are circular and covered with dry adherent eschar.  The presentation of these ulcerations is consistent with those resultant from arterial insufficiency. There are no ulcerations on the right LE and there is some edema noted to that extremity. Wound type: Suspected arterial insufficiency ulcerations Pressure Ulcer POA: No Measurement: Ten (10) wounds on left LE.  From proximal to distal:  Medial thigh: 2.5cm x 1cm, Lateral LE at patella: 1.6cm x 1.4cm, Medial LE at patella: 1.8cm x 1.5cm, Pretibial area (2): proximal: 0.8cm x 0.8cm and distal: 0.5cm x 0.5cm, Anterior LE distal to pretibial:  2.4cm x 1.5cm, Medial Le near malleolus:  1cm x 1.8cm, Posterior LE: two wounds in a 3.5cm x 2cm area. Wound bed:All wounds are covered by stable, dry adherent eschar Drainage (amount, consistency, odor)  Periwound:Mild erythema extending less than 0.5cm in the periphery surround wounds in the medial thigh and poster LE. Dressing procedure/placement/frequency: With ulcers of this suspected etiology, it is not advisable to debride eschar and create a wound with little healing potential.  I have provided nursing with instructions for conservative wound care to dry eschar, keep area free from potential infection and protected from further injury until and if vascular intervention is performed.  In the event of revascularization, wounds in this category are more prone to healing. A pressure redistribution boot for the left LE is provided to avoid pressure injury to the heel. [place] nursing team will not follow, but will remain available to this patient, the nursing and medical team.  Please re-consult if needed. Thanks, Maudie Flakes, MSN, RN, [place], [place], [place] [phone])

## 2014-07-10 NOTE — H&P (Addendum)
[facility] History and Physical  Stephen [name] NWG:[id number] DOB: 01-Jan-1941 DOA: 07/09/2014  Referring physician: EDP PCP: Reynold Bowen, NT   Chief Complaint: LLE pain   HPI: Stephen [name] is a 73 y.o. male with severe multisystem atherosclerotic disease presents to ED with ongoing left greater than right lower extremity pain.  Pain is worse with activity, starts in mid thigh and radiates down leg.  This has been going on for 3 months but despite going to different hospitals hasnt seen a vascular surgeon yet.  There are multiple poorly healing ulcers on the LLE.  He also reports a 2 day history of increased SOB, wheezing and cough.  He denies CP at this time.  Review of Systems: Systems reviewed.  As above, otherwise negative  Past Medical History  Diagnosis Date  . Diabetes mellitus   . Hypertension   . Pancreatitis   . Neuropathy   . Hyperlipemia   . Peripheral vascular disease     a. 10/2013: severely abnormal ABIs, for OP evaluation.  [name] GERD (gastroesophageal reflux disease)   . S/P colonoscopy July 2003    hyperplastic polyp, diverticulosis, anal papilla and internal hemorrhoids r  . S/P endoscopy 2003    Dr. Braulio Bosch: erosive reflux esophagitis , PUD  . PUD (peptic ulcer disease)     diagnosed via EGD by Dr. Braulio Bosch at [place]  . Diverticulosis   . Hemorrhoids   . Coronary artery disease      a. BMS to LAD 1999. b. HSRA to LAD and stent to RCA 2007. c. NSTEMI/CABG x 3 in 04/2010. d. NSTEMI 10/2013 - secondary to demand ischemia in setting of COPD exacerbation with severe underlying CAD - cath with 3/3 patent grafts, for medical therapy.   [name] COPD (chronic obstructive pulmonary disease)     a. Ongoing tobacco abuse.  . Arthritis   . Neutropenia     a. Dx 10/2013 on labs - instructed to f/u PCP.  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   . Ischemic cardiomyopathy     a.  previously EF 30% in 2011. b. 10/2013: EF 45-50% by echo.  . Alcoholism     a. Remote alcoholism  . 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   Past Surgical History  Procedure Laterality Date  . Coronary artery bypass graft  June 2011    X3  . Right inguinal hernia repair    . Cholecystectomy      3-4 years ago  . Appendectomy      age 74  . Cataracts    . Colonoscopy  05/12/2002    Dr. [name]- diverticulosis, anal papilla, internal hemorrhoids, rectal polyps  . [name] dilation  04/16/2012    Schatzki's ring-status post dilation and disruption/ as described above. Grade 1 esophageal varices. Small hiatal hernia.  [name] dilation  04/16/2012    Procedure: [name] DILATION;  Surgeon: Daneil Dolin, MD;  [facility];  Service: Endoscopy;  Laterality: N/A;  . Colonoscopy  08/10/2012    Procedure: COLONOSCOPY;  Surgeon: Daneil Dolin, MD;  Location: [facility];  Service: Endoscopy;  Laterality: N/A;  9:45 needs 30 mins extra /have Glucagon on hand   Social History:  reports that he has been smoking Cigarettes.  He has a 94.5 pack-year smoking history. He uses smokeless tobacco. He reports that  he does not drink alcohol or use illicit drugs.  Allergies  Allergen Reactions  . Levaquin [Levofloxacin] Hives    Family History  Problem Relation Age of Onset  . Coronary artery disease Mother   . Coronary artery disease Father   . Diabetes Sister   . Suicidality Brother   . Cirrhosis Brother   . Colon cancer Neg Hx      Prior to Admission medications   Medication Sig Start Date End Date Taking? Authorizing Provider  albuterol-ipratropium (COMBIVENT) 18-103 MCG/ACT inhaler Inhale 1-2 puffs into the lungs every 6 (six) hours as needed for wheezing or shortness of breath. 11/02/13  Yes Dayna N Dunn, PA-C  aspirin EC 325 MG tablet Take 325 mg by mouth every morning.   Yes Historical Provider, MD  atorvastatin (LIPITOR) 40 MG tablet Take  40 mg by mouth daily.   Yes Historical Provider, MD  clopidogrel (PLAVIX) 75 MG tablet Take 75 mg by mouth daily.   Yes Historical Provider, MD  fenofibrate 160 MG tablet Take 160 mg by mouth daily with breakfast.    Yes Historical Provider, MD  furosemide (LASIX) 20 MG tablet Take 2 tablets (40 mg total) by mouth daily. 06/28/14  Yes Maudry Diego, MD  gabapentin (NEURONTIN) 100 MG capsule Take 300 mg by mouth at bedtime.  06/08/14  Yes Historical Provider, MD  glipiZIDE (GLUCOTROL XL) 2.5 MG 24 hr tablet Take 2.5 mg by mouth 2 (two) times daily.    Yes Historical Provider, MD  Insulin Glargine (LANTUS SOLOSTAR [place]) Inject 30 Units into the skin daily.    Yes Historical Provider, MD  lisinopril (PRINIVIL,ZESTRIL) 20 MG tablet Take 20 mg by mouth daily.     Yes Historical Provider, MD  loratadine (CLARITIN) 10 MG tablet Take 10 mg by mouth daily.   Yes Historical Provider, MD  metFORMIN (GLUCOPHAGE) 1000 MG tablet Take 1,000 mg by mouth 2 (two) times daily with a meal.   Yes Historical Provider, MD  metoprolol tartrate (LOPRESSOR) 25 MG tablet Take 25 mg by mouth 2 (two) times daily.   Yes Historical Provider, MD  nitroGLYCERIN (NITROSTAT) 0.4 MG SL tablet Place 0.4 mg under the tongue every 5 (five) minutes x 3 doses as needed.   Yes Historical Provider, MD  Pancrelipase, Lip-Prot-Amyl, (CREON) 24000 UNITS CPEP Take 3 capsules by mouth 4 (four) times daily. To take three to four times daily (PER PHARMACY RECORD)   Yes Historical Provider, MD  pantoprazole (PROTONIX) 40 MG tablet Take 1 tablet (40 mg total) by mouth daily. 11/02/13  Yes Dayna N Dunn, PA-C  spironolactone (ALDACTONE) 25 MG tablet Take 25 mg by mouth daily.     Yes Historical Provider, MD  SYMBICORT 160-4.5 MCG/ACT inhaler Inhale 2 puffs into the lungs 2 (two) times daily.  07/04/12  Yes Historical Provider, MD   Physical Exam: Filed Vitals:   07/10/14 0300  BP: 128/60  Pulse: 95  Temp:   Resp: 18    BP 128/60  Pulse 95   Temp(Src) 98.2 F (36.8 C) (Oral)  Resp 18  SpO2 95%  General Appearance:    Alert, oriented, no distress, appears stated age  Head:    Normocephalic, atraumatic  Eyes:    PERRL, EOMI, sclera non-icteric        Nose:   Nares without drainage or epistaxis. Mucosa, turbinates normal  Throat:   Moist mucous membranes. Oropharynx without erythema or exudate.  Neck:   Supple. No carotid bruits.  No  thyromegaly.  No lymphadenopathy.   Back:     No CVA tenderness, no spinal tenderness  Lungs:     Bibasilar rales, bilateral diffuse wheezing.  Chest wall:    No tenderness to palpitation  Heart:    Regular rate and rhythm without murmurs, gallops, rubs  Abdomen:     Soft, non-tender, nondistended, normal bowel sounds, no organomegaly  Genitalia:    deferred  Rectal:    deferred  Extremities:   LLE with mild erythema and numerous ischemic poorly healing ulcers.  Pulses:   Able to doppler DP and PT in LLE  Skin:   Skin color, texture, turgor normal, no rashes or lesions  Lymph nodes:   Cervical, supraclavicular, and axillary nodes normal  Neurologic:   CNII-XII intact. Normal strength, sensation and reflexes      throughout    Labs on Admission:  Basic Metabolic Panel:  Recent Labs Lab 07/09/14 2113  NA 139  K 4.0  CL 99  CO2 28  GLUCOSE 210*  BUN 15  CREATININE 1.04  CALCIUM 9.3   Liver Function Tests:  Recent Labs Lab 07/09/14 2113  AST 20  ALT 10  ALKPHOS 44  BILITOT <0.2*  PROT 6.7  ALBUMIN 2.7*   No results found for this basename: LIPASE, AMYLASE,  in the last 168 hours No results found for this basename: AMMONIA,  in the last 168 hours CBC:  Recent Labs Lab 07/09/14 2113  WBC 8.2  NEUTROABS 6.5  HGB 10.1*  HCT 31.5*  MCV 92.4  PLT 253   Cardiac Enzymes:  Recent Labs Lab 07/09/14 2348 07/10/14 0214  TROPONINI 0.41* 0.93*    BNP (last 3 results)  Recent Labs  06/28/14 1700 07/09/14 2348  PROBNP 1600.0* 1466.0*   CBG:  Recent Labs Lab  07/09/14 2114  GLUCAP 185*    Radiological Exams on Admission: Dg Chest Port 1 View  07/10/2014   CLINICAL DATA:  Shortness of breath. Diabetes. Hypertension. Coronary artery disease.  EXAM: PORTABLE CHEST - 1 VIEW  COMPARISON:  06/28/2014  FINDINGS: The heart size and mediastinal contours are within normal limits. Both lungs are clear. No evidence of pleural effusion. Prior CABG again noted.  IMPRESSION: No active disease.   Electronically Signed   By: Earle Gell M.D.   On: 07/10/2014 00:26    EKG: Independently reviewed. LBBB appears worse than prior.  Assessment/Plan Principal Problem:   Atherosclerosis Active Problems:   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   CORONARY ARTERY DISEASE, S/P PTCA   ATHEROSLERO NATV ART EXTREM W/INTERMIT CLAUDICAT   Abdominal aneurysm without mention of rupture   CORONARY ARTERY BYPASS GRAFT, HX OF   HTN (hypertension)   Chronic systolic congestive heart failure   Secondary DM with complication   NSTEMI (non-ST elevated myocardial infarction)   COPD exacerbation   1. Atherosclerosis - Multiple organ systems affected as detailed below 1. NSTEMI - patient with elevating troponin already up from 0.4 to 0.9.  Dr. Claiborne Billings has been informed of the troponin increase but does not want heparin gtt at this time.  Dr. Claiborne Billings and Cath lab doc are both aware of the EKG findings of LBBB.  Dr. Claiborne Billings has already written consult note, see in chart. 1. Patient NPO for now, NS at 50 cc/hr and holding lasix / spironolactone while NPO.  Holding pancrease while NPO. 2. Serial trops ordered 3. ASA, plavix, statin, beta blocker, ACEI all being continued from home meds 4. Patient  actually not having any chest pain at this time. 5. Last cath was in dec 2014, showed diffuse disease and patent grafts, likely had demand ischemia at that time.  Today's NSTEMI also likely demand ischemia. 2. PAD - 1. Intermittent claudication of LLE > RLE ongoing for 3+ months with  ischemic ulcers over that time period 2. Arterial US BLE ordered, abdominal aortic ultrasound ordered given the history of AAA 3. No emergent indication for vascular surgery intervention, will hold off on formal consult for now at least until we have the [place] studies back as I doubt that they would perform a non-emergent procedure right now on this patient who appears to be actively having an NSTEMI and COPD exacerbation. 4. Wound care consult for ulcers, no SIRS criteria to suggest infection at this time. 2. DM - 1. Patient NPO, hold glipizide, continue metformin, reduce lantus dose from 30 to 20 and add a low dose SSI q4h. 3. COPD exacerbation - 1. Solumedrol 60 q12h IV 2. Adult wheeze protocol, try and use xopenex sparingly on patient who appears to be actively having NSTEMI. 4. HTN - continue home meds except lasix / spironolactone 5. Hyperlipidemia - continue statin  Cardiology on board.  Code Status: Full  Family Communication: No family in room Disposition Plan: Admit to inpatient   Time spent: 70 min  GARDNER, JARED M. [facility] Pager [phone]  If 7AM-7PM, please contact the day team taking care of the patient [email or web address] Password [email or web address] 07/10/2014, 5:10 AM

## 2014-07-10 NOTE — Progress Notes (Addendum)
[facility] PROGRESS NOTE Interim History: 73 y.o. male with severe multisystem atherosclerotic disease presents to ED with ongoing left greater than right lower extremity pain. Pain is worse with activity, starts in mid thigh and radiates down leg.  There are multiple poorly healing ulcers on the LLE.    Assessment/Plan: Ulcer of left lower extremity/ ATHEROSLERO NATV ART EXTREM W/INTERMIT CLAUDICAT: - Intermittent claudication of LLE > RLE ongoing for 3+ months, but now worst than it has ever been, now with redness and tender to touch and multiple dry ulcer. - Arterial US BLE ordered: Bilateral external iliac arteries exhibit monophasic flow, suggestive of aorto-iliac disease.decrease in velocity between the right proximal femoral artery and the mid femoral artery - check a CK. - Abdominal aortic ultrasound: Maximal AP diameter is 1.9 cm - Wound care consult:  dry eschar, keep area free from potential infection and protected from further injury. - Consult vascular surgery. - Cont NPO until seen by vascular. - d/c metfromin  NSTEMI (non-ST elevated myocardial infarction): - ASA, plavix, statin, beta blocker, ACEI  - Dr. Claiborne Billings has been informed of the troponin increase but does not want heparin gtt at this time. - cont to have JVD, single dose IV lasix. - Cont statins. Check a CK.  COPD exacerbation: - mild wheezing - started on Solumedrol 60 q12h IV Adult wheeze protocol and use xopenex  Diabetes mellitus II with vascular complications: - D/c metformin. - Increase long acting insulin, cont CBG's, he is on solumedrol which will increase his blood glucose.  Chronic systolic congestive heart failure EF 40%: - Mild fluid overload. - Restart lasix IV, monitor strict I and o's. - hold aldactone.  HTN  - continue home meds resume lasix.  Hyperlipidemia:  - continue statin    Code Status: Full  Family Communication: No family in room  Disposition Plan: Admit to  inpatient     Consultants:  Cardiology  Vascular surgery  Procedures: Lower ext arterial doppler: right there is a significant decrease in velocity between the right proximal femoral artery and the mid femoral artery, with collateralization off of the proximal femoral artery. The waveforms of the right mid femoral, distal femoral, popliteal, posterior tibial, peroneal, anterior tibial, and dorsalis pedis arteries exhibit dampened monophasic and severely dampened monophasic flow.  Left A portion of the left proximal femoral artery appears to be occluded with no internal flow noted. The remaining segments of the left femoral artery exhibit tardus parvus flow. The left distal posterior tibial, and left proximal anterior tibial arteries also demonstrate tardus parvus flow. The left popliteal, posterior tibial, peroneal, anterior tibial, and dorsalis pedis arteries exhibit dampened monophasic and severely dampened monophasic flow.    Antibiotics:  none  HPI/Subjective: Complaining of left lower ext pain  Objective: Filed Vitals:   07/10/14 0807 07/10/14 1010 07/10/14 1029 07/10/14 1139  BP: 155/74  145/64   Pulse: 91  78   Temp: 98.3 F (36.8 C)   98.1 F (36.7 C)  TempSrc: Oral   Oral  Resp: 18     Height: 5\' 11"  (1.803 m)     Weight: 89.7 kg (197 lb 12 oz)     SpO2: 100% 98%      Intake/Output Summary (Last 24 hours) at 07/10/14 1154 Last data filed at 07/10/14 1000  Gross per 24 hour  Intake    170 ml  Output   1175 ml  Net  -1005 ml   Filed Weights   07/10/14 0807  Weight:  89.7 kg (197 lb 12 oz)    Exam:  General: Alert, awake, oriented x3, in no acute distress.  HEENT: No bruits, no goiter. +JVD. Heart: Regular rate and rhythm. Lungs: Good air movement, wheezing. Abdomen: Soft, nontender, nondistended, positive bowel sounds.    Data Reviewed: Basic Metabolic Panel:  Recent Labs Lab 07/09/14 2113  NA 139  K 4.0  CL 99  CO2 28  GLUCOSE 210*  BUN 15   CREATININE 1.04  CALCIUM 9.3   Liver Function Tests:  Recent Labs Lab 07/09/14 2113  AST 20  ALT 10  ALKPHOS 44  BILITOT <0.2*  PROT 6.7  ALBUMIN 2.7*   No results found for this basename: LIPASE, AMYLASE,  in the last 168 hours No results found for this basename: AMMONIA,  in the last 168 hours CBC:  Recent Labs Lab 07/09/14 2113  WBC 8.2  NEUTROABS 6.5  HGB 10.1*  HCT 31.5*  MCV 92.4  PLT 253   Cardiac Enzymes:  Recent Labs Lab 07/09/14 2348 07/10/14 0214 07/10/14 0611 07/10/14 1020  TROPONINI 0.41* 0.93* 1.47* 2.07*   BNP (last 3 results)  Recent Labs  06/28/14 1700 07/09/14 2348  PROBNP 1600.0* 1466.0*   CBG:  Recent Labs Lab 07/09/14 2114 07/10/14 0805  GLUCAP 185* 208*    Recent Results (from the past 240 hour(s))  MRSA PCR SCREENING     Status: None   Collection Time    07/10/14  8:22 AM      Result Value Ref Range Status   MRSA by PCR NEGATIVE  NEGATIVE Final   Comment:            The GeneXpert MRSA Assay (FDA     approved for NASAL specimens     only), is one component of a     comprehensive MRSA colonization     surveillance program. It is not     intended to diagnose MRSA     infection nor to guide or     monitor treatment for     MRSA infections.     Studies: US Aorta  07/10/2014   CLINICAL DATA:  Coronary artery disease. Peripheral vascular disease. Abdominal aortic aneurysm.  EXAM: ULTRASOUND OF ABDOMINAL AORTA  TECHNIQUE: Ultrasound examination of the abdominal aorta was performed to evaluate for abdominal aortic aneurysm.  COMPARISON:  CT lumbar spine 03/13/2014  FINDINGS: Abdominal Aorta  No aneurysm identified.  Maximum AP  Diameter:  1.9 cm  Maximum TRV  Diameter: 2 cm  Atherosclerotic calcification is noted. Iliac arteries are obscured.  IMPRESSION: No abdominal aortic aneurysm. Maximal AP diameter is 1.9 cm. Normal caliber of the abdominal aorta.   Electronically Signed   By: Lucienne Capers M.D.   On: 07/10/2014 06:06    Dg Chest Port 1 View  07/10/2014   CLINICAL DATA:  Shortness of breath. Diabetes. Hypertension. Coronary artery disease.  EXAM: PORTABLE CHEST - 1 VIEW  COMPARISON:  06/28/2014  FINDINGS: The heart size and mediastinal contours are within normal limits. Both lungs are clear. No evidence of pleural effusion. Prior CABG again noted.  IMPRESSION: No active disease.   Electronically Signed   By: Earle Gell M.D.   On: 07/10/2014 00:26    Scheduled Meds: . antiseptic oral rinse  7 mL Mouth Rinse BID  . aspirin EC  325 mg Oral q morning - 10a  . atorvastatin  40 mg Oral Daily  . budesonide-formoterol  2 puff Inhalation BID  . clopidogrel  75  mg Oral Daily  . fenofibrate  160 mg Oral Q breakfast  . gabapentin  300 mg Oral QHS  . heparin  5,000 Units Subcutaneous 3 times per day  . insulin aspart  0-9 Units Subcutaneous 6 times per day  . insulin glargine  20 Units Subcutaneous Daily  . levalbuterol  0.63 mg Nebulization Q6H  . lisinopril  20 mg Oral Daily  . loratadine  10 mg Oral Daily  . metFORMIN  1,000 mg Oral BID WC  . methylPREDNISolone (SOLU-MEDROL) injection  60 mg Intravenous Q12H  . metoprolol tartrate  25 mg Oral BID  . pantoprazole  40 mg Oral Daily  . sodium chloride  3 mL Intravenous Q12H   Continuous Infusions: . sodium chloride 50 mL/hr at 07/10/14 0636     Charlynne Cousins  [facility] Pager [phone]. If 8PM-8AM, please contact night-coverage at [email or web address], password [facility] 07/10/2014, 11:54 AM  LOS: 1 day      **Disclaimer: This note may have been dictated with voice recognition software. Similar sounding words can inadvertently be transcribed and this note may contain transcription errors which may not have been corrected upon publication of note.**

## 2014-07-11 DIAGNOSIS — I10 Essential (primary) hypertension: Secondary | ICD-10-CM

## 2014-07-11 DIAGNOSIS — I5022 Chronic systolic (congestive) heart failure: Secondary | ICD-10-CM

## 2014-07-11 DIAGNOSIS — I509 Heart failure, unspecified: Secondary | ICD-10-CM

## 2014-07-11 DIAGNOSIS — J9601 Acute respiratory failure with hypoxia: Secondary | ICD-10-CM | POA: Diagnosis present

## 2014-07-11 DIAGNOSIS — J449 Chronic obstructive pulmonary disease, unspecified: Secondary | ICD-10-CM

## 2014-07-11 DIAGNOSIS — J4489 Other specified chronic obstructive pulmonary disease: Secondary | ICD-10-CM

## 2014-07-11 DIAGNOSIS — L97909 Non-pressure chronic ulcer of unspecified part of unspecified lower leg with unspecified severity: Secondary | ICD-10-CM

## 2014-07-11 DIAGNOSIS — I739 Peripheral vascular disease, unspecified: Secondary | ICD-10-CM

## 2014-07-11 DIAGNOSIS — J441 Chronic obstructive pulmonary disease with (acute) exacerbation: Secondary | ICD-10-CM

## 2014-07-11 DIAGNOSIS — J9600 Acute respiratory failure, unspecified whether with hypoxia or hypercapnia: Secondary | ICD-10-CM

## 2014-07-11 DIAGNOSIS — M7989 Other specified soft tissue disorders: Secondary | ICD-10-CM

## 2014-07-11 LAB — BASIC METABOLIC PANEL
ANION GAP: 11 (ref 5–15)
BUN: 22 mg/dL (ref 6–23)
CO2: 27 meq/L (ref 19–32)
CREATININE: 1.04 mg/dL (ref 0.50–1.35)
Calcium: 8.5 mg/dL (ref 8.4–10.5)
Chloride: 99 mEq/L (ref 96–112)
GFR calc non Af Amer: 70 mL/min — ABNORMAL LOW (ref >90)
GFR, EST AFRICAN AMERICAN: 81 mL/min — AB (ref >90)
Glucose, Bld: 263 mg/dL — ABNORMAL HIGH (ref 70–99)
POTASSIUM: 4.5 meq/L (ref 3.7–5.3)
Sodium: 137 mEq/L (ref 137–147)

## 2014-07-11 LAB — GLUCOSE, CAPILLARY
GLUCOSE-CAPILLARY: 269 mg/dL — AB (ref 70–99)
GLUCOSE-CAPILLARY: 221 mg/dL — AB (ref 70–99)
GLUCOSE-CAPILLARY: 331 mg/dL — AB (ref 70–99)
GLUCOSE-CAPILLARY: 311 mg/dL — AB (ref 70–99)
Glucose-Capillary: 269 mg/dL — ABNORMAL HIGH (ref 70–99)

## 2014-07-11 MED ORDER — FUROSEMIDE 40 MG PO TABS
40.0000 mg | ORAL_TABLET | Freq: Every day | ORAL | Status: DC
  Administered 2014-07-12 – 2014-07-14 (×3): 40 mg via ORAL
  Filled 2014-07-11 (×3): qty 1

## 2014-07-11 MED ORDER — INSULIN GLARGINE 100 UNIT/ML ~~LOC~~ SOLN
25.0000 [IU] | Freq: Two times a day (BID) | SUBCUTANEOUS | Status: DC
  Administered 2014-07-11 (×2): 25 [IU] via SUBCUTANEOUS
  Filled 2014-07-11 (×5): qty 0.25

## 2014-07-11 MED ORDER — INSULIN ASPART 100 UNIT/ML ~~LOC~~ SOLN
0.0000 [IU] | Freq: Three times a day (TID) | SUBCUTANEOUS | Status: DC
  Administered 2014-07-12: 3 [IU] via SUBCUTANEOUS
  Administered 2014-07-12: 5 [IU] via SUBCUTANEOUS
  Administered 2014-07-12: 7 [IU] via SUBCUTANEOUS
  Administered 2014-07-13: 5 [IU] via SUBCUTANEOUS
  Administered 2014-07-13: 1 [IU] via SUBCUTANEOUS

## 2014-07-11 MED ORDER — SPIRONOLACTONE 25 MG PO TABS
25.0000 mg | ORAL_TABLET | Freq: Every day | ORAL | Status: DC
  Administered 2014-07-11 – 2014-07-14 (×4): 25 mg via ORAL
  Filled 2014-07-11 (×4): qty 1

## 2014-07-11 MED ORDER — FUROSEMIDE 10 MG/ML IJ SOLN
40.0000 mg | Freq: Two times a day (BID) | INTRAMUSCULAR | Status: AC
  Administered 2014-07-11 (×2): 40 mg via INTRAVENOUS
  Filled 2014-07-11 (×2): qty 4

## 2014-07-11 MED ORDER — METHYLPREDNISOLONE SODIUM SUCC 125 MG IJ SOLR
80.0000 mg | Freq: Two times a day (BID) | INTRAMUSCULAR | Status: DC
  Administered 2014-07-11 – 2014-07-13 (×5): 80 mg via INTRAVENOUS
  Filled 2014-07-11 (×7): qty 1.28

## 2014-07-11 MED ORDER — DOXYCYCLINE HYCLATE 100 MG IV SOLR
100.0000 mg | Freq: Two times a day (BID) | INTRAVENOUS | Status: DC
  Administered 2014-07-11 (×2): 100 mg via INTRAVENOUS
  Filled 2014-07-11 (×5): qty 100

## 2014-07-11 NOTE — Progress Notes (Signed)
*  Preliminary Results* Right lower extremity venous duplex completed. Right lower extremity is negative for deep vein thrombosis. There is no evidence of right Baker's cyst.  07/11/2014 3:35 PM  Maudry Mayhew, RVT, RDCS, RDMS

## 2014-07-11 NOTE — Evaluation (Signed)
Physical Therapy Evaluation Patient Details Name: Stephen [name] MRN: [id number] DOB: 1941/03/03 Today's Date: 07/11/2014   History of Present Illness  Pt adm with LLE ulcer and SOB. PMH- DM, COPD, HTN, CAD  Clinical Impression  Pt admitted with above. Pt currently with functional limitations due to the deficits listed below (see PT Problem List).  Pt will benefit from skilled PT to increase their independence and safety with mobility to allow discharge home with family.     Follow Up Recommendations Home health PT     Equipment Recommendations  None recommended by PT    Recommendations for Other Services       Precautions / Restrictions Precautions Precautions: None      Mobility  Bed Mobility Overal bed mobility: Needs Assistance Bed Mobility: Supine to Sit     Supine to sit: Supervision     General bed mobility comments: supervision primarily for lines/tubes  Transfers Overall transfer level: Needs assistance Equipment used: Rolling walker (2 wheeled) Transfers: Sit to/from Stand Sit to Stand: Min assist         General transfer comment: Assist to bring hips up.  Ambulation/Gait Ambulation/Gait assistance: Min assist Ambulation Distance (Feet): 225 Feet Assistive device: Rolling walker (2 wheeled) Gait Pattern/deviations: Step-through pattern;Decreased step length - right;Decreased step length - left;Trunk flexed Gait velocity: decr Gait velocity interpretation: Below normal speed for age/gender General Gait Details: Verbal cues to stand more erect and stay closer to walker.  Stairs            Wheelchair Mobility    Modified Rankin (Stroke Patients Only)       Balance Overall balance assessment: Needs assistance Sitting-balance support: No upper extremity supported;Feet supported Sitting balance-Leahy Scale: Fair     Standing balance support: No upper extremity supported Standing balance-Leahy Scale: Fair                                Pertinent Vitals/Pain Pain Assessment: No/denies pain    Home Living Family/patient expects to be discharged to:: Private residence Living Arrangements: Spouse/significant other;Children Available Help at Discharge: Family;Available 24 hours/day Type of Home: House Home Access: Stairs to enter Entrance Stairs-Rails: Right Entrance Stairs-Number of Steps: 2 Home Layout: One level Home Equipment: Walker - 2 wheels;Cane - single point;Bedside commode      Prior Function Level of Independence: Independent with assistive device(s)         Comments: Uses walker or cane. Has history of falls.     Hand Dominance        Extremity/Trunk Assessment   Upper Extremity Assessment: Overall WFL for tasks assessed           Lower Extremity Assessment: Generalized weakness         Communication   Communication: No difficulties  Cognition Arousal/Alertness: Awake/alert Behavior During Therapy: WFL for tasks assessed/performed Overall Cognitive Status: Within Functional Limits for tasks assessed                      General Comments      Exercises        Assessment/Plan    PT Assessment Patient needs continued PT services  PT Diagnosis Difficulty walking;Generalized weakness   PT Problem List Decreased strength;Decreased balance;Decreased mobility;Decreased knowledge of use of DME  PT Treatment Interventions DME instruction;Gait training;Functional mobility training;Therapeutic activities;Stair training;Therapeutic exercise;Balance training;Patient/family education   PT Goals (Current goals can be found in  the Care Plan section) Acute Rehab PT Goals Patient Stated Goal: Return home PT Goal Formulation: With patient Time For Goal Achievement: 07/18/14 Potential to Achieve Goals: Good    Frequency Min 3X/week   Barriers to discharge        Co-evaluation               End of Session Equipment Utilized During Treatment: Gait  belt Activity Tolerance: Patient tolerated treatment well Patient left: in chair;with call bell/phone within reach;with nursing/sitter in room Nurse Communication: Mobility status         Time: 0160-1093 PT Time Calculation (min): 16 min   Charges:   PT Evaluation $Initial PT Evaluation Tier I: 1 Procedure PT Treatments $Gait Training: 8-22 mins   PT G Codes:          Jazmin Vensel July 24, 2014, 11:03 AM  Suanne Marker PT [phone]

## 2014-07-11 NOTE — Consult Note (Signed)
CTA findings reviewed.  As suspected the patient has severe inflow disease with bilateral external iliac and common femoral disease as well as bilateral SFA occlusions with reconstitution of the below knee popliteal and 3 vessel runoff bilaterally.  Pt will need angiogram once his pulmonary status is improved to see if some of this iliac disease is potentially stentable otherwise consideration will need to be given for aortobifem (less likely with his underlying pulmonary status) or axillary bifem  Will most likely need outpatient follow up unless his pulmonary status dramatically improves this admission  Ruta Hinds, MD [facility]: [phone] Pager: [phone]

## 2014-07-11 NOTE — Progress Notes (Signed)
Subjective:  Chronic SOB, no CP  Objective:  Temp:  [97.7 F (36.5 C)-98.6 F (37 C)] 97.7 F (36.5 C) (09/07 0735) Pulse Rate:  [60-95] 64 (09/07 0700) Resp:  [14-23] 15 (09/07 0700) BP: (129-195)/(56-151) 148/75 mmHg (09/07 0700) SpO2:  [93 %-100 %] 98 % (09/07 0700) Weight:  [197 lb 12 oz (89.7 kg)] 197 lb 12 oz (89.7 kg) (09/06 0807) Weight change:   Intake/Output from previous day: 09/06 0701 - 09/07 0700 In: 2160 [P.O.:960; I.V.:1200] Out: 1875 [Urine:1875]  Intake/Output from this shift:    Physical Exam: General appearance: alert and no distress Neck: no adenopathy, no carotid bruit, no JVD, supple, symmetrical, trachea midline and thyroid not enlarged, symmetric, no tenderness/mass/nodules Lungs: Diffuse wheezes bilaterally Heart: regular rate and rhythm, S1, S2 normal, no murmur, click, rub or gallop Extremities: extremities normal, atraumatic, no cyanosis or edema  Lab Results: Results for orders placed during the hospital encounter of 07/09/14 (from the past 48 hour(s))  CBC WITH DIFFERENTIAL     Status: Abnormal   Collection Time    07/09/14  9:13 PM      Result Value Ref Range   WBC 8.2  4.0 - 10.5 K/uL   RBC 3.41 (*) 4.22 - 5.81 MIL/uL   Hemoglobin 10.1 (*) 13.0 - 17.0 g/dL   HCT 31.5 (*) 39.0 - 52.0 %   MCV 92.4  78.0 - 100.0 fL   MCH 29.6  26.0 - 34.0 pg   MCHC 32.1  30.0 - 36.0 g/dL   RDW 15.5  11.5 - 15.5 %   Platelets 253  150 - 400 K/uL   Neutrophils Relative % 79 (*) 43 - 77 %   Neutro Abs 6.5  1.7 - 7.7 K/uL   Lymphocytes Relative 10 (*) 12 - 46 %   Lymphs Abs 0.8  0.7 - 4.0 K/uL   Monocytes Relative 10  3 - 12 %   Monocytes Absolute 0.9  0.1 - 1.0 K/uL   Eosinophils Relative 1  0 - 5 %   Eosinophils Absolute 0.1  0.0 - 0.7 K/uL   Basophils Relative 0  0 - 1 %   Basophils Absolute 0.0  0.0 - 0.1 K/uL  COMPREHENSIVE METABOLIC PANEL     Status: Abnormal   Collection Time    07/09/14  9:13 PM      Result Value Ref Range   Sodium  139  137 - 147 mEq/L   Potassium 4.0  3.7 - 5.3 mEq/L   Chloride 99  96 - 112 mEq/L   CO2 28  19 - 32 mEq/L   Glucose, Bld 210 (*) 70 - 99 mg/dL   BUN 15  6 - 23 mg/dL   Creatinine, Ser 1.04  0.50 - 1.35 mg/dL   Calcium 9.3  8.4 - 10.5 mg/dL   Total Protein 6.7  6.0 - 8.3 g/dL   Albumin 2.7 (*) 3.5 - 5.2 g/dL   AST 20  0 - 37 U/L   ALT 10  0 - 53 U/L   Alkaline Phosphatase 44  39 - 117 U/L   Total Bilirubin <0.2 (*) 0.3 - 1.2 mg/dL   GFR calc non Af Amer 70 (*) >90 mL/min   GFR calc Af Amer 81 (*) >90 mL/min   Comment: (NOTE)     The eGFR has been calculated using the CKD EPI equation.     This calculation has not been validated in all clinical situations.  eGFR's persistently <90 mL/min signify possible Chronic Kidney     Disease.   Anion gap 12  5 - 15  CBG MONITORING, ED     Status: Abnormal   Collection Time    07/09/14  9:14 PM      Result Value Ref Range   Glucose-Capillary 185 (*) 70 - 99 mg/dL  URINALYSIS, ROUTINE W REFLEX MICROSCOPIC     Status: Abnormal   Collection Time    07/09/14  9:25 PM      Result Value Ref Range   Color, Urine YELLOW  YELLOW   APPearance CLOUDY (*) CLEAR   Specific Gravity, Urine 1.020  1.005 - 1.030   pH 5.5  5.0 - 8.0   Glucose, UA 500 (*) NEGATIVE mg/dL   Hgb urine dipstick MODERATE (*) NEGATIVE   Bilirubin Urine NEGATIVE  NEGATIVE   Ketones, ur NEGATIVE  NEGATIVE mg/dL   Protein, ur >300 (*) NEGATIVE mg/dL   Urobilinogen, UA 0.2  0.0 - 1.0 mg/dL   Nitrite NEGATIVE  NEGATIVE   Leukocytes, UA NEGATIVE  NEGATIVE  URINE MICROSCOPIC-ADD ON     Status: None   Collection Time    07/09/14  9:25 PM      Result Value Ref Range   Squamous Epithelial / LPF RARE  RARE   RBC / HPF 3-6  <3 RBC/hpf   Bacteria, UA RARE  RARE   Urine-Other RARE YEAST    PRO B NATRIURETIC PEPTIDE     Status: Abnormal   Collection Time    07/09/14 11:48 PM      Result Value Ref Range   Pro B Natriuretic peptide (BNP) 1466.0 (*) 0 - 125 pg/mL  TROPONIN I      Status: Abnormal   Collection Time    07/09/14 11:48 PM      Result Value Ref Range   Troponin I 0.41 (*) <0.30 ng/mL   Comment:            Due to the release kinetics of cTnI,     a negative result within the first hours     of the onset of symptoms does not rule out     myocardial infarction with certainty.     If myocardial infarction is still suspected,     repeat the test at appropriate intervals.     CRITICAL RESULT CALLED TO, READ BACK BY AND VERIFIED WITH:     BREWER E,RN 07/10/14 0026 WAYK  TROPONIN I     Status: Abnormal   Collection Time    07/10/14  2:14 AM      Result Value Ref Range   Troponin I 0.93 (*) <0.30 ng/mL   Comment:            Due to the release kinetics of cTnI,     a negative result within the first hours     of the onset of symptoms does not rule out     myocardial infarction with certainty.     If myocardial infarction is still suspected,     repeat the test at appropriate intervals.     CRITICAL VALUE NOTED.  VALUE IS CONSISTENT WITH PREVIOUSLY REPORTED AND CALLED VALUE.  TROPONIN I     Status: Abnormal   Collection Time    07/10/14  6:11 AM      Result Value Ref Range   Troponin I 1.47 (*) <0.30 ng/mL   Comment:  Due to the release kinetics of cTnI,     a negative result within the first hours     of the onset of symptoms does not rule out     myocardial infarction with certainty.     If myocardial infarction is still suspected,     repeat the test at appropriate intervals.     CRITICAL VALUE NOTED.  VALUE IS CONSISTENT WITH PREVIOUSLY REPORTED AND CALLED VALUE.  GLUCOSE, CAPILLARY     Status: Abnormal   Collection Time    07/10/14  8:05 AM      Result Value Ref Range   Glucose-Capillary 208 (*) 70 - 99 mg/dL   Comment 1 Documented in Chart     Comment 2 Notify RN    MRSA PCR SCREENING     Status: None   Collection Time    07/10/14  8:22 AM      Result Value Ref Range   MRSA by PCR NEGATIVE  NEGATIVE   Comment:             The GeneXpert MRSA Assay (FDA     approved for NASAL specimens     only), is one component of a     comprehensive MRSA colonization     surveillance program. It is not     intended to diagnose MRSA     infection nor to guide or     monitor treatment for     MRSA infections.  TROPONIN I     Status: Abnormal   Collection Time    07/10/14 10:20 AM      Result Value Ref Range   Troponin I 2.07 (*) <0.30 ng/mL   Comment:            Due to the release kinetics of cTnI,     a negative result within the first hours     of the onset of symptoms does not rule out     myocardial infarction with certainty.     If myocardial infarction is still suspected,     repeat the test at appropriate intervals.     CRITICAL VALUE NOTED.  VALUE IS CONSISTENT WITH PREVIOUSLY REPORTED AND CALLED VALUE.  GLUCOSE, CAPILLARY     Status: Abnormal   Collection Time    07/10/14 11:38 AM      Result Value Ref Range   Glucose-Capillary 182 (*) 70 - 99 mg/dL   Comment 1 Documented in Chart     Comment 2 Notify RN    GLUCOSE, CAPILLARY     Status: Abnormal   Collection Time    07/10/14  4:07 PM      Result Value Ref Range   Glucose-Capillary 209 (*) 70 - 99 mg/dL   Comment 1 Documented in Chart     Comment 2 Notify RN    TROPONIN I     Status: Abnormal   Collection Time    07/10/14  4:33 PM      Result Value Ref Range   Troponin I 1.92 (*) <0.30 ng/mL   Comment:            Due to the release kinetics of cTnI,     a negative result within the first hours     of the onset of symptoms does not rule out     myocardial infarction with certainty.     If myocardial infarction is still suspected,     repeat the test at appropriate intervals.  CRITICAL VALUE NOTED.  VALUE IS CONSISTENT WITH PREVIOUSLY REPORTED AND CALLED VALUE.  CK     Status: None   Collection Time    07/10/14  4:33 PM      Result Value Ref Range   Total CK 222  7 - 232 U/L  GLUCOSE, CAPILLARY     Status: Abnormal   Collection Time     07/10/14  8:26 PM      Result Value Ref Range   Glucose-Capillary 278 (*) 70 - 99 mg/dL  GLUCOSE, CAPILLARY     Status: Abnormal   Collection Time    07/10/14 11:29 PM      Result Value Ref Range   Glucose-Capillary 162 (*) 70 - 99 mg/dL  BASIC METABOLIC PANEL     Status: Abnormal   Collection Time    07/11/14  2:30 AM      Result Value Ref Range   Sodium 137  137 - 147 mEq/L   Potassium 4.5  3.7 - 5.3 mEq/L   Chloride 99  96 - 112 mEq/L   CO2 27  19 - 32 mEq/L   Glucose, Bld 263 (*) 70 - 99 mg/dL   BUN 22  6 - 23 mg/dL   Creatinine, Ser 1.04  0.50 - 1.35 mg/dL   Calcium 8.5  8.4 - 10.5 mg/dL   GFR calc non Af Amer 70 (*) >90 mL/min   GFR calc Af Amer 81 (*) >90 mL/min   Comment: (NOTE)     The eGFR has been calculated using the CKD EPI equation.     This calculation has not been validated in all clinical situations.     eGFR's persistently <90 mL/min signify possible Chronic Kidney     Disease.   Anion gap 11  5 - 15  GLUCOSE, CAPILLARY     Status: Abnormal   Collection Time    07/11/14  3:50 AM      Result Value Ref Range   Glucose-Capillary 269 (*) 70 - 99 mg/dL    Imaging: Imaging results have been reviewed  Assessment/Plan:   1. Principal Problem: 2.   Ulcer of left lower extremity 3. Active Problems: 4.   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   CORONARY ARTERY DISEASE, S/P PTCA 6.   ATHEROSLERO NATV ART EXTREM W/INTERMIT CLAUDICAT 7.   Abdominal aneurysm without mention of rupture 8.   CORONARY ARTERY BYPASS GRAFT, HX OF 9.   HTN (hypertension) 10.   Chronic systolic congestive heart failure 11.   Secondary DM with complication 12.   NSTEMI (non-ST elevated myocardial infarction) 13.   COPD exacerbation 14.   Atherosclerosis 15.   Acute respiratory failure with hypoxia 16.   Time Spent Directly with Patient:  20 minutes  Length of Stay:  LOS: 2 days   Pt admitted with leg pain. He has COPD with on going tobacco abuse and diffuse  wheezing. We were consulted because of Trop of .4 which has risen to 2. He has chronic LBBB. H/O CAD s/p CABG with cath in Dec 2014 by Dr. Jearld Lesch that showed patent grafts with moderate LVD (EF 40%). Doubt that his increase in trop is related to ACS but rather demand ischemia especially in light of cath documented patent grafts 8 months ago. [facility] has seen for LLE claud and ? CLI. CTA pending of abd/pelvis. No plans for cath unless recurrent CP. Will continue to follow.  BERRY,JONATHAN J 07/11/2014, 8:00 AM

## 2014-07-11 NOTE — Progress Notes (Addendum)
[facility] PROGRESS NOTE Interim History: 73 y.o. male with severe multisystem atherosclerotic disease presents to ED with ongoing left greater than right lower extremity pain. Pain is worse with activity, starts in mid thigh and radiates down leg.  There are multiple poorly healing ulcers on the LLE.    Assessment/Plan: Ulcer of left lower extremity/ ATHEROSLERO NATV ART EXTREM W/INTERMIT CLAUDICAT: - Has remained afebrile, no leukocytosis. - Arterial US BLE ordered: as below. - Abdominal aortic ultrasound: Maximal AP diameter is 1.9 cm - Wound care consult:  dry eschar, keep area free from potential infection and protected from further injury. - Appreciate vascular assistance, pulmonary function needs to improve before proceeding to with any procedure, see below. - Ct angio of legs pain.  Elevation in cardiac enzymes due to demand ischemia: - ASA, plavix, statin, beta blocker, ACEI  - Cardiology on board. - Cont to have JVD, repeat IV lasix. - Cont statins.   Acute hypoxic respiratory failure/COPD exacerbation: - Cont to wheeze, increase steroids. - Conton Solumedrol 80 q12h IV, de-escalate antibiotics to doxycycline. - Will need PFT's as an outpatient and follow up with pulmonary.  Diabetes mellitus II with vascular complications: - D/c metformin. - Increase long acting insulin, cont CBG's, he is on solumedrol which will increase his blood glucose.  Acute on Chronic systolic congestive heart failure EF 40%: - Mild fluid overload. With JVD - cont  lasix IV, monitor strict I and o's. - Resume aldactone  HTN  - continue home meds resume lasix.  Hyperlipidemia:  - continue statin    Code Status: Full  Family Communication: No family in room  Disposition Plan: Admit to inpatient     Consultants:  Cardiology  Vascular surgery  Procedures: Lower ext arterial doppler: right there is a significant decrease in velocity between the right proximal femoral  artery and the mid femoral artery, with collateralization off of the proximal femoral artery. The waveforms of the right mid femoral, distal femoral, popliteal, posterior tibial, peroneal, anterior tibial, and dorsalis pedis arteries exhibit dampened monophasic and severely dampened monophasic flow.  Left A portion of the left proximal femoral artery appears to be occluded with no internal flow noted. The remaining segments of the left femoral artery exhibit tardus parvus flow. The left distal posterior tibial, and left proximal anterior tibial arteries also demonstrate tardus parvus flow. The left popliteal, posterior tibial, peroneal, anterior tibial, and dorsalis pedis arteries exhibit dampened monophasic and severely dampened monophasic flow.    Antibiotics:  none  HPI/Subjective: Relates SOB and leg pain improved.  Objective: Filed Vitals:   07/11/14 0400 07/11/14 0500 07/11/14 0600 07/11/14 0700  BP: 195/151 146/67 135/63 148/75  Pulse: 88 68 60 64  Temp: 98 F (36.7 C)     TempSrc: Oral     Resp: 19 18 14 15   Height:      Weight:      SpO2: 95% 96% 97% 98%    Intake/Output Summary (Last 24 hours) at 07/11/14 0732 Last data filed at 07/11/14 0700  Gross per 24 hour  Intake   2160 ml  Output   1875 ml  Net    285 ml   Filed Weights   07/10/14 0807  Weight: 89.7 kg (197 lb 12 oz)    Exam:  General: Alert, awake, oriented x3, in no acute distress.  HEENT: No bruits, no goiter. +JVD. Heart: Regular rate and rhythm. Lungs: improved air movement, wheezing. Abdomen: Soft, nontender, nondistended, positive bowel sounds.  Data Reviewed: Basic Metabolic Panel:  Recent Labs Lab 07/09/14 2113 07/11/14 0230  NA 139 137  K 4.0 4.5  CL 99 99  CO2 28 27  GLUCOSE 210* 263*  BUN 15 22  CREATININE 1.04 1.04  CALCIUM 9.3 8.5   Liver Function Tests:  Recent Labs Lab 07/09/14 2113  AST 20  ALT 10  ALKPHOS 44  BILITOT <0.2*  PROT 6.7  ALBUMIN 2.7*   No  results found for this basename: LIPASE, AMYLASE,  in the last 168 hours No results found for this basename: AMMONIA,  in the last 168 hours CBC:  Recent Labs Lab 07/09/14 2113  WBC 8.2  NEUTROABS 6.5  HGB 10.1*  HCT 31.5*  MCV 92.4  PLT 253   Cardiac Enzymes:  Recent Labs Lab 07/09/14 2348 07/10/14 0214 07/10/14 0611 07/10/14 1020 07/10/14 1633  CKTOTAL  --   --   --   --  222  TROPONINI 0.41* 0.93* 1.47* 2.07* 1.92*   BNP (last 3 results)  Recent Labs  06/28/14 1700 07/09/14 2348  PROBNP 1600.0* 1466.0*   CBG:  Recent Labs Lab 07/10/14 1138 07/10/14 1607 07/10/14 2026 07/10/14 2329 07/11/14 0350  GLUCAP 182* 209* 278* 162* 269*    Recent Results (from the past 240 hour(s))  MRSA PCR SCREENING     Status: None   Collection Time    07/10/14  8:22 AM      Result Value Ref Range Status   MRSA by PCR NEGATIVE  NEGATIVE Final   Comment:            The GeneXpert MRSA Assay (FDA     approved for NASAL specimens     only), is one component of a     comprehensive MRSA colonization     surveillance program. It is not     intended to diagnose MRSA     infection nor to guide or     monitor treatment for     MRSA infections.     Studies: US Aorta  07/10/2014   CLINICAL DATA:  Coronary artery disease. Peripheral vascular disease. Abdominal aortic aneurysm.  EXAM: ULTRASOUND OF ABDOMINAL AORTA  TECHNIQUE: Ultrasound examination of the abdominal aorta was performed to evaluate for abdominal aortic aneurysm.  COMPARISON:  CT lumbar spine 03/13/2014  FINDINGS: Abdominal Aorta  No aneurysm identified.  Maximum AP  Diameter:  1.9 cm  Maximum TRV  Diameter: 2 cm  Atherosclerotic calcification is noted. Iliac arteries are obscured.  IMPRESSION: No abdominal aortic aneurysm. Maximal AP diameter is 1.9 cm. Normal caliber of the abdominal aorta.   Electronically Signed   By: Lucienne Capers M.D.   On: 07/10/2014 06:06   Dg Chest Port 1 View  07/10/2014   CLINICAL DATA:   Shortness of breath. Diabetes. Hypertension. Coronary artery disease.  EXAM: PORTABLE CHEST - 1 VIEW  COMPARISON:  06/28/2014  FINDINGS: The heart size and mediastinal contours are within normal limits. Both lungs are clear. No evidence of pleural effusion. Prior CABG again noted.  IMPRESSION: No active disease.   Electronically Signed   By: Earle Gell M.D.   On: 07/10/2014 00:26    Scheduled Meds: . antiseptic oral rinse  7 mL Mouth Rinse BID  . aspirin EC  325 mg Oral q morning - 10a  . atorvastatin  40 mg Oral Daily  . budesonide-formoterol  2 puff Inhalation BID  . clopidogrel  75 mg Oral Daily  . fenofibrate  160 mg Oral  Q breakfast  . furosemide  40 mg Oral Daily  . gabapentin  300 mg Oral QHS  . heparin  5,000 Units Subcutaneous 3 times per day  . insulin aspart  0-9 Units Subcutaneous 6 times per day  . insulin glargine  15 Units Subcutaneous BID  . levalbuterol  0.63 mg Nebulization Q6H  . lipase/protease/amylase  72,000 Units Oral TID WC & HS  . lisinopril  20 mg Oral Daily  . loratadine  10 mg Oral Daily  . methylPREDNISolone (SOLU-MEDROL) injection  60 mg Intravenous Q12H  . metoprolol tartrate  25 mg Oral BID  . pantoprazole  40 mg Oral Daily  . sodium chloride  3 mL Intravenous Q12H   Continuous Infusions: . sodium chloride 50 mL/hr at 07/10/14 0636     Stephen [name]  [facility] Pager [phone]. If 8PM-8AM, please contact night-coverage at [email or web address], password [facility] 07/11/2014, 7:32 AM  LOS: 2 days      **Disclaimer: This note may have been dictated with voice recognition software. Similar sounding words can inadvertently be transcribed and this note may contain transcription errors which may not have been corrected upon publication of note.**

## 2014-07-11 NOTE — Progress Notes (Signed)
WHEN PT TRANSFERRED HERE TO UNIT HE DID NOT WANT IVF RESTATRED.

## 2014-07-11 NOTE — Progress Notes (Signed)
Patinet received into 5W07, oriented to room and unit process. Patient sitting up in chair with visitors at the bedside.  Patient placed on telemetry box #1. And CCMD called Stephen [name] called.

## 2014-07-12 DIAGNOSIS — I2589 Other forms of chronic ischemic heart disease: Secondary | ICD-10-CM

## 2014-07-12 LAB — GLUCOSE, CAPILLARY
GLUCOSE-CAPILLARY: 320 mg/dL — AB (ref 70–99)
Glucose-Capillary: 265 mg/dL — ABNORMAL HIGH (ref 70–99)
Glucose-Capillary: 213 mg/dL — ABNORMAL HIGH (ref 70–99)
Glucose-Capillary: 193 mg/dL — ABNORMAL HIGH (ref 70–99)

## 2014-07-12 MED ORDER — DOXYCYCLINE HYCLATE 100 MG PO TABS
100.0000 mg | ORAL_TABLET | Freq: Two times a day (BID) | ORAL | Status: DC
  Administered 2014-07-12 – 2014-07-14 (×5): 100 mg via ORAL
  Filled 2014-07-12 (×6): qty 1

## 2014-07-12 MED ORDER — INSULIN GLARGINE 100 UNIT/ML ~~LOC~~ SOLN
35.0000 [IU] | Freq: Two times a day (BID) | SUBCUTANEOUS | Status: DC
  Administered 2014-07-12 – 2014-07-13 (×3): 35 [IU] via SUBCUTANEOUS
  Filled 2014-07-12 (×5): qty 0.35

## 2014-07-12 MED ORDER — METFORMIN HCL 500 MG PO TABS
1000.0000 mg | ORAL_TABLET | Freq: Two times a day (BID) | ORAL | Status: DC
  Administered 2014-07-12 – 2014-07-13 (×3): 1000 mg via ORAL
  Filled 2014-07-12 (×5): qty 2

## 2014-07-12 MED ORDER — GLIPIZIDE ER 2.5 MG PO TB24
2.5000 mg | ORAL_TABLET | Freq: Two times a day (BID) | ORAL | Status: DC
  Administered 2014-07-12 – 2014-07-14 (×5): 2.5 mg via ORAL
  Filled 2014-07-12 (×7): qty 1

## 2014-07-12 NOTE — Progress Notes (Signed)
Inpatient Diabetes Program Recommendations  AACE/ADA: New Consensus Statement on Inpatient Glycemic Control (2013)  Target Ranges:  Prepandial:   less than 140 mg/dL      Peak postprandial:   less than 180 mg/dL (1-2 hours)      Critically ill patients:  140 - 180 mg/dL   Reason for Visit: Hyperglycemia  Diabetes history: DM2 Outpatient Diabetes medications: Lantus 30 units QD, glipizide 2.5 mg bid and metformin 1000 mg bid Current orders for Inpatient glycemic control: Lantus 25 units bid, Novolog sensitive tidwc Results for MALAHKI, GASAWAY (MRN [id number]) as of 07/12/2014 10:07  Ref. Range 07/11/2014 07:31 07/11/2014 12:13 07/11/2014 16:57 07/11/2014 20:15 07/12/2014 07:46  Glucose-Capillary Latest Range: 70-99 mg/dL 221 (H) 269 (H) 331 (H) 311 (H) 320 (H)   Hyperglycemia in the presence of Solumedrol. Needs insulin adjustment.   Inpatient Diabetes Program Recommendations Insulin - Meal Coverage: Add Novolog 5 units tidwc for meal coverage insulin if pt eats >50% meal HgbA1C: 10.9% - uncontrolled Correction - Increase Novolog to moderate tidwc and hs  Will continue to follow. Thank you. Lorenda Peck, RD, LDN, CDE Inpatient Diabetes Coordinator [phone]

## 2014-07-12 NOTE — Care Management Note (Signed)
    Page 1 of 2   07/14/2014     12:19:30 PM CARE MANAGEMENT NOTE 07/14/2014  Patient:  OLIVER, HEITZENRATER   Account Number:  [id number]  Date Initiated:  07/12/2014  Documentation initiated by:  [facility]  Subjective/Objective Assessment:   COPD     Action/Plan:   pt eval- rec hhpt   Anticipated DC Date:  07/14/2014   Anticipated DC Plan:  [place]  CM consult      [facility] Choice  [place]   Choice offered to / List presented to:  C-1 Patient   DME arranged  NEBULIZER MACHINE      DME agency  [place] arranged  HH-1 RN  [place].   Status of service:  Completed, signed off Medicare Important Message given?  YES (If response is "NO", the following Medicare IM given date fields will be blank) Date Medicare IM given:  07/12/2014 Medicare IM given by:  Tomi Bamberger Date Additional Medicare IM given:   Additional Medicare IM given by:    Discharge Disposition:  [place]  Per UR Regulation:  Reviewed for med. necessity/level of care/duration of stay  If discussed at [place] of Stay Meetings, dates discussed:    Comments:  07/14/14 [place], BSN [phone] patient is for dc today, he chose [facility] for [facility] for CoPD program and HHPT.  Referral made to [facility], Butch Penny notified. Soc will begin 24-48 hrs post dc.  James with [facility] notified for nebulizer machine as well.

## 2014-07-12 NOTE — Progress Notes (Signed)
[facility] PROGRESS NOTE Interim History: 73 y.o. male with severe multisystem atherosclerotic disease presents to ED with ongoing left greater than right lower extremity pain. Pain is worse with activity, starts in mid thigh and radiates down leg.  There are multiple poorly healing ulcers on the LLE. Also with acute respiratory failure due to COPD exac. Pt cannot speak in full sentence on 9.8.2015     Assessment/Plan: Ulcer of left lower extremity/ ATHEROSLERO NATV ART EXTREM W/INTERMIT CLAUDICAT: - Arterial US BLE ordered: as below. - Abdominal aortic ultrasound: Maximal AP diameter is 1.9 cm. - Wound care consult:  dry eschar, keep area free from potential infection and protected from further injury. - Appreciate vascular assistance, pulmonary function needs to improve before proceeding to with any procedure, see below. - Ct angio as below, management per vascular.  Elevation in cardiac enzymes due to demand ischemia: - ASA, plavix, statin, beta blocker, ACEI. Recommended to cont medical management. - Cardiology on board. - Cont statins.   Acute hypoxic respiratory failure/COPD exacerbation: - Cont to wheeze, cont steroids. Pt cannot speak in full sentences. - Conton Solumedrol 80 q12h IV, de-escalate antibiotics to doxycycline. - Will need PFT's as an outpatient and follow up with pulmonary. Before proceeding with any vascular intervention  Diabetes mellitus II with vascular complications: - restart metformin. - Increase long acting insulin, BG cont to be high.  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 - cont aldactone  HTN  - continue home meds resume lasix.  Hyperlipidemia:  - continue statin    Code Status: Full  Family Communication: No family in room  Disposition Plan: Admit to inpatient     Consultants:  Cardiology  Vascular surgery  Procedures: Lower ext arterial doppler:  right there is a significant decrease in velocity between the right proximal femoral artery and the mid femoral artery, with collateralization off of the proximal femoral artery. The waveforms of the right mid femoral, distal femoral, popliteal, posterior tibial, peroneal, anterior tibial, and dorsalis pedis arteries exhibit dampened monophasic and severely dampened monophasic flow.  Left A portion of the left proximal femoral artery appears to be occluded with no internal flow noted. The remaining segments of the left femoral artery exhibit tardus parvus flow. The left distal posterior tibial, and left proximal anterior tibial arteries also demonstrate tardus parvus flow. The left popliteal, posterior tibial, peroneal, anterior tibial, and dorsalis pedis arteries exhibit dampened monophasic and severely dampened monophasic flow.    Antibiotics:  none  HPI/Subjective: Relates SOB and leg pain improved. Pt still winded while carrying a conversation.  Objective: Filed Vitals:   07/11/14 1917 07/11/14 2225 07/12/14 0551 07/12/14 0739  BP:  163/67 139/60   Pulse:  67 67 80  Temp:  98.1 F (36.7 C) 97.7 F (36.5 C)   TempSrc:  Oral Oral   Resp:  17 17 18   Height:      Weight:      SpO2: 89% 97% 100% 98%    Intake/Output Summary (Last 24 hours) at 07/12/14 1037 Last data filed at 07/12/14 0855  Gross per 24 hour  Intake    840 ml  Output   3950 ml  Net  -3110 ml   Filed Weights   07/10/14 0807  Weight: 89.7 kg (197 lb 12 oz)    Exam:  General: Alert, awake, oriented x3, in no acute distress.  HEENT: No bruits, no goiter. Heart: Regular  rate and rhythm. Lungs: improved air movement, wheezing. Abdomen: Soft, nontender, nondistended, positive bowel sounds.    Data Reviewed: Basic Metabolic Panel:  Recent Labs Lab 07/09/14 2113 07/11/14 0230  NA 139 137  K 4.0 4.5  CL 99 99  CO2 28 27  GLUCOSE 210* 263*  BUN 15 22  CREATININE 1.04 1.04  CALCIUM 9.3 8.5   Liver  Function Tests:  Recent Labs Lab 07/09/14 2113  AST 20  ALT 10  ALKPHOS 44  BILITOT <0.2*  PROT 6.7  ALBUMIN 2.7*   No results found for this basename: LIPASE, AMYLASE,  in the last 168 hours No results found for this basename: AMMONIA,  in the last 168 hours CBC:  Recent Labs Lab 07/09/14 2113  WBC 8.2  NEUTROABS 6.5  HGB 10.1*  HCT 31.5*  MCV 92.4  PLT 253   Cardiac Enzymes:  Recent Labs Lab 07/09/14 2348 07/10/14 0214 07/10/14 0611 07/10/14 1020 07/10/14 1633  CKTOTAL  --   --   --   --  222  TROPONINI 0.41* 0.93* 1.47* 2.07* 1.92*   BNP (last 3 results)  Recent Labs  06/28/14 1700 07/09/14 2348  PROBNP 1600.0* 1466.0*   CBG:  Recent Labs Lab 07/11/14 0731 07/11/14 1213 07/11/14 1657 07/11/14 2015 07/12/14 0746  GLUCAP 221* 269* 331* 311* 320*    Recent Results (from the past 240 hour(s))  MRSA PCR SCREENING     Status: None   Collection Time    07/10/14  8:22 AM      Result Value Ref Range Status   MRSA by PCR NEGATIVE  NEGATIVE Final   Comment:            The GeneXpert MRSA Assay (FDA     approved for NASAL specimens     only), is one component of a     comprehensive MRSA colonization     surveillance program. It is not     intended to diagnose MRSA     infection nor to guide or     monitor treatment for     MRSA infections.     Studies: Ct Angio Ao+bifem W/cm &/or Wo/cm  07/11/2014   CLINICAL DATA:  Nonhealing ulcers within the lower extremities consistent with aortoiliac disease Evaluate for PAD.  EXAM: CT ANGIOGRAPHY OF ABDOMINAL AORTA WITH ILIOFEMORAL RUNOFF  TECHNIQUE: Multidetector CT imaging of the abdomen, pelvis and lower extremities was performed using the standard protocol during bolus administration of intravenous contrast. Multiplanar CT image reconstructions and MIPs were obtained to evaluate the vascular anatomy.  CONTRAST:  179mL OMNIPAQUE IOHEXOL 350 MG/ML SOLN  COMPARISON:  CT abdomen pelvis - 06/04/2011; chest  radiograph - 07/10/2014  FINDINGS: Vascular Findings:  Abdominal aorta: There is a moderate to large amount of eccentric mixed calcified and noncalcified slightly irregular atherosclerotic plaque throughout the normal caliber abdominal aorta, not resulting in a hemodynamically significant stenosis. No abdominal aortic dissection or periaortic stranding.  Celiac artery: Widely patent without a hemodynamically significant stenosis. The left gastric artery is incidentally noted to have a separate origin to the right lateral aspect of the main celiac trunk. Otherwise, conventional branching pattern.  SMA: There is a minimal amount of eccentric mixed calcified and noncalcified atherosclerotic plaque involving the cranial aspect of the origin the SMA, not resulting in a hemodynamically significant stenosis. Additionally, there is a minimal amount of eccentric calcified plaque within in the mid aspect of the main trunk of the SMA (axial image 64, series 5,  also not resulting in hemodynamically significant stenosis. Conventional branching pattern. The distal tributaries of the SMA are widely patent without discrete filling defect to suggest distal embolism.  Right Renal artery: Solitary; there is a mixture of calcified and noncalcified atherosclerotic plaque involving the origin of the right renal artery, not resulting in a hemodynamically significant stenosis.  Left Renal artery: Solitary ; there is a very minimal amount of eccentric predominantly calcified atherosclerotic plaque involving the origin of the left renal artery, not resulting in hemodynamically significant stenosis.  IMA: Widely patent.   --------------------------------------------------------------------------------  RIGHT LOWER EXTREMITY VASCULATURE:  Right-sided pelvic vasculature: There is a moderate amount of mixed calcified and noncalcified atherosclerotic plaque throughout the right common iliac artery, not resulting in hemodynamically significant  stenosis. The right internal iliac artery is heavily disease though patent and of normal caliber. There is a large amount of mixed calcified and noncalcified atherosclerotic plaque throughout the proximal aspect of the right external iliac artery resulting in tandem areas of high-grade (grade in 70% stenosis (representative images axial image 106, series 5, coronal images 90 and 93, series 10).  Right common femoral artery: There is a mixture of calcified and noncalcified atherosclerotic plaque within the right common femoral artery which results in approximately 50% luminal narrowing (image 129, series 5  Right deep femoral artery: Disease proximally though patent and supplying the majority of the blood flow to the right lower extremity.  Right superficial femoral artery: Occluded at its origin with reconstituted atretic flow through its mid aspect (representative axial image 179, series 5) with eventual occlusion again distally (axial image 234, series 5.  Right popliteal artery: There is reconstitution of the right above knee popliteal artery via deep thigh and geniculate collaterals. There is a mixture of calcified and noncalcified atherosclerotic plaque throughout the right above knee popliteal artery which results in multiple areas of high-grade (> 70%) luminal narrowing. The right below-knee popliteal artery is widely patent.  Right lower leg: There is eccentric calcified atherosclerotic plaque involving the proximal anterior tibial and tibioperoneal trunks, however there is a preserved three-vessel runoff to the right foot. The right dorsalis pedis artery is patent to the level of the forefoot.   ---------------------------------------------------------------------------------  LEFT LOWER EXTREMITY VASCULATURE:  Left-sided pelvic vasculature: There is eccentric calcified and noncalcified atherosclerotic plaque throughout the left external iliac artery resulting in tandem long segment areas of high-grade  (greater 70%) luminal narrowing (representative axial images 91 and 97, series 5, coronal image 92, series 10), though note, intraluminal evaluation is degraded secondary to mural calcification. The left internal iliac artery is heavily diseased though patent and of normal caliber. There is eccentric mixed calcified and noncalcified atherosclerotic plaque throughout the left external iliac artery resulting in tandem long segment areas of high-grade (greater 70%) luminal narrowing).  Left common femoral artery: There is a mixture of calcified and noncalcified atherosclerotic plaque within the left common femoral artery which results an approximately 50% luminal narrowing (image 130, series 5).  Left deep femoral artery: Disease status origin though patent supplying the majority the blood flow to the left leg.  Left superficial femoral artery: There is a very minimal amount of atretic flow involving the very proximal aspect of the left superficial femoral artery. The vessel as otherwise occluded throughout its course through the level of the adductor canal.  Left popliteal artery: There is reconstitution of flow of the left above knee popliteal artery at via deep thigh an geniculate collaterals. There is eccentric mixed calcified and noncalcified  atherosclerotic plaque throughout the left above knee popliteal artery resulting in tandem areas of hemodynamically significant stenosis. The left below-knee popliteal artery is widely patent without hemodynamically significant stenosis.  Left lower leg: There is eccentric atherosclerotic plaque involving the takeoff and proximal aspect of the left anterior tibial artery as well as the distal tibioperoneal trunk, however there is a preserved three-vessel runoff to the left lower leg and foot. The left-sided dorsalis pedis artery is patent to the level of the forefoot.  Review of the MIP images confirms the above findings.    --------------------------------------------------------------------------------  Nonvascular Findings:  Evaluation of the abdominal organs is limited to the arterial phase of enhancement.  Normal hepatic contour. No discrete hyperenhancing hepatic lesions. Punctate calcification within the caudal aspect of the right lobe of the liver (image 57, series 5, is unchanged in favored to the sequela of prior granulomatous infection and/or injury. No ascites.  There is symmetric enhancement of the bilateral kidneys. Note is made of an approximately 1.2 cm partially exophytic hypo attenuating (17 Hounsfield unit) cyst arising from the posterior aspect of the inferior pole the right kidney (axial image 68, series 5). No discrete left-sided renal lesions. No definite renal stones on this postcontrast examination. No urinary obstruction. Minimal amount of grossly symmetric bilateral perinephric stranding. Normal appearance of the bilateral adrenal glands and spleen. Stable sequela of chronic calcific pancreatitis. No peripancreatic stranding or definitive pancreatic ductal dilatation on this non pancreatic protocol CT.  Scattered minimal colonic diverticulosis without evidence of diverticulitis. Moderate colonic stool burden without evidence of obstruction. No discrete area of bowel wall thickening. Normal appearance of the appendix. No pneumoperitoneum, pneumatosis or portal venous gas.  Prostate is enlarged with mass effect upon the urinary bladder. Normal appearance of the urinary bladder given degree distention. No free fluid within the pelvic cul-de-sac. Incidentally noted bilateral moderate to large sized hydroceles.  Limited visualization of the lower thorax demonstrates small bilateral effusions with associated bibasilar heterogeneous opacities, likely atelectatic left cyst. There is minimal subsegmental atelectasis within the imaged caudal aspects of the right middle lobe and lingula. There is mild diffuse bronchial  wall thickening within the imaged bilateral lower lobe bronchi.  Borderline cardiomegaly, in particular, there is enlargement of the left ventricle. Post median sternotomy with atherosclerotic plaque within the native coronary arteries.  No acute or aggressive osseous abnormalities. Mild degenerate change of the bilateral hips.  There is mild diffuse body wall anasarca.  IMPRESSION: Vascular Impression:  1. Moderate to large amount of mixed calcified and noncalcified slightly irregular atherosclerotic plaque with a normal caliber abdominal aorta, not resulting in a hemodynamically significant stenosis. 2. The major branch vessels of the abdominal aorta (including the IMA) appear patent without a hemodynamically significant stenosis. Right Lower Extremity Vascular Impression:  1. Eccentric mixed calcified and noncalcified atherosclerotic plaque results in tandem areas of high-grade (> 70%) luminal narrowing throughout the proximal aspect of the right external iliac artery. 2. Eccentric mixed calcified and noncalcified atherosclerotic plaque results in approximately 50% luminal narrowing of the right common femoral artery. 3. The right superficial femoral artery is largely occluded throughout its course with very minimal atretic throw through its mid aspect. 4. The right above knee popliteal artery is reconstituted via deep thigh and geniculate collaterals though is heavily diseased. The right below-knee popliteal artery is widely patent. 5. Eccentric calcified plaque involving the proximal aspects of the anterior tibial artery and tibial peritoneal trunk, however there is a preserved three-vessel runoff to the right lower  leg and foot. The right dorsalis pedis artery is patent to the level of the forefoot. Left Lower Extremity Vascular Impression:  1. Eccentric mixed calcified and noncalcified irregular atherosclerotic plaque results in long segment high-grade (greater 70%) luminal narrowing throughout the left  common and external iliac arteries. 2. Eccentric mixed calcified and noncalcified atherosclerotic plaque results in approximately 50% luminal narrowing of the left common femoral artery. 3. Very minimal atretic flow involving the very proximal aspect of the left superficial femoral artery. The left superficial femoral artery it is otherwise occluded throughout its course through the level of the adductor canal. 4. The left above knee popliteal artery is reconstituted via deep thigh and geniculate collaterals those heavily diseased. The left below-knee popliteal artery is widely patent. 5. There are several areas of eccentric calcified atherosclerotic plaque involving the anterior tibial and tibioperoneal trunk, however there is a preserved three-vessel runoff to the left lower leg and foot. The left dorsalis pedis artery is patent to the level of the forefoot. Non vascular Impression:  1. Cardiomegaly. Post median sternotomy with atherosclerotic plaque within the native coronary arteries. 2. Suspected pulmonary edema with small bilateral effusions and mild diffuse body wall anasarca/third-spacing. Clinical correlation is advised. 3. Bronchial wall thickening suggestive of airways disease/bronchitis. 4. Instilling noted moderate to large size bilateral hydroceles. 5. Stable sequela of chronic calcific pancreatitis. No definitive peripancreatic stranding to suggest a superimposed acute process.   Electronically Signed   By: Sandi Mariscal M.D.   On: 07/11/2014 08:21    Scheduled Meds: . aspirin EC  325 mg Oral q morning - 10a  . atorvastatin  40 mg Oral Daily  . budesonide-formoterol  2 puff Inhalation BID  . clopidogrel  75 mg Oral Daily  . doxycycline  100 mg Oral Q12H  . fenofibrate  160 mg Oral Q breakfast  . furosemide  40 mg Oral Daily  . gabapentin  300 mg Oral QHS  . heparin  5,000 Units Subcutaneous 3 times per day  . insulin aspart  0-9 Units Subcutaneous TID WC  . insulin glargine  35 Units  Subcutaneous BID  . levalbuterol  0.63 mg Nebulization Q6H  . lipase/protease/amylase  72,000 Units Oral TID WC & HS  . lisinopril  20 mg Oral Daily  . loratadine  10 mg Oral Daily  . methylPREDNISolone (SOLU-MEDROL) injection  80 mg Intravenous Q12H  . metoprolol tartrate  25 mg Oral BID  . pantoprazole  40 mg Oral Daily  . spironolactone  25 mg Oral Daily   Continuous Infusions: . sodium chloride Stopped (07/11/14 1000)     Charlynne Cousins  [facility] Pager [phone]. If 8PM-8AM, please contact night-coverage at [email or web address], password [facility] 07/12/2014, 10:37 AM  LOS: 3 days    **Disclaimer: This note may have been dictated with voice recognition software. Similar sounding words can inadvertently be transcribed and this note may contain transcription errors which may not have been corrected upon publication of note.**

## 2014-07-12 NOTE — Progress Notes (Addendum)
Patient Profile: Stephen [name] is a 73 yo man with PMH of T2DM, PVD, dyslipidemia, CAD with CABG in 6/11 with recent LHC in 10/2013 demonstrating patent grafts with moderate LVD (EF 40%), COPD with continued tobacco use (multiple packs per day), who presented with left leg pain (venous duplex negative for DVT). PVD suspected. Vascular surgery following. Cardiology actually consulted for initial troponin of 0.4 (peaked at 2.07) and ECG with chonric LBBB.    Subjective: Currently chest pain free. However patient notes recent episodes of angina that occurred last week. Described as substernal chest burning occuring at rest. One episode of nocturnal angina with profuse diaphoresis. He used SL NTG ~3 different occasions last week. Discomfort was immediately relieved after 1 dose each time.   Objective: Vital signs in last 24 hours: Temp:  [97.5 F (36.4 C)-98.1 F (36.7 C)] 97.7 F (36.5 C) (09/08 0551) Pulse Rate:  [67-88] 80 (09/08 0739) Resp:  [16-18] 18 (09/08 0739) BP: (115-163)/(60-68) 139/60 mmHg (09/08 0551) SpO2:  [89 %-100 %] 98 % (09/08 0739) Last BM Date: 07/10/14  Intake/Output from previous day: 09/07 0701 - 09/08 0700 In: 1240 [P.O.:840; I.V.:150; IV Piggyback:250] Out: 4709 [Urine:3825] Intake/Output this shift: Total I/O In: 360 [P.O.:360] Out: 450 [Urine:450]  Medications Current Facility-Administered Medications  Medication Dose Route Frequency Provider Last Rate Last Dose  . 0.9 %  sodium chloride infusion   Intravenous Continuous Etta Quill, DO      . aspirin EC tablet 325 mg  325 mg Oral q morning - 10a Etta Quill, DO   325 mg at 07/11/14 0911  . atorvastatin (LIPITOR) tablet 40 mg  40 mg Oral Daily Etta Quill, DO   40 mg at 07/11/14 0911  . budesonide-formoterol (SYMBICORT) 160-4.5 MCG/ACT inhaler 2 puff  2 puff Inhalation BID Etta Quill, DO   2 puff at 07/12/14 0737  . clopidogrel (PLAVIX) tablet 75 mg  75 mg Oral Daily Etta Quill, DO   75  mg at 07/11/14 0911  . doxycycline (VIBRAMYCIN) 100 mg in dextrose 5 % 250 mL IVPB  100 mg Intravenous BID Charlynne Cousins, MD   100 mg at 07/11/14 2337  . fenofibrate tablet 160 mg  160 mg Oral Q breakfast Etta Quill, DO   160 mg at 07/12/14 0920  . furosemide (LASIX) tablet 40 mg  40 mg Oral Daily Charlynne Cousins, MD      . gabapentin (NEURONTIN) capsule 300 mg  300 mg Oral QHS Etta Quill, DO   300 mg at 07/11/14 2309  . heparin injection 5,000 Units  5,000 Units Subcutaneous 3 times per day Etta Quill, DO   5,000 Units at 07/12/14 [phone]  . HYDROcodone-acetaminophen (NORCO/VICODIN) 5-325 MG per tablet 1 tablet  1 tablet Oral Q4H PRN Jeryl Columbia, NP   1 tablet at 07/11/14 2336  . insulin aspart (novoLOG) injection 0-9 Units  0-9 Units Subcutaneous TID WC Charlynne Cousins, MD   7 Units at 07/12/14 [phone]  . insulin glargine (LANTUS) injection 25 Units  25 Units Subcutaneous BID Charlynne Cousins, MD   25 Units at 07/11/14 2310  . levalbuterol (XOPENEX) nebulizer solution 0.63 mg  0.63 mg Nebulization Q6H Etta Quill, DO   0.63 mg at 07/12/14 0737  . levalbuterol (XOPENEX) nebulizer solution 0.63 mg  0.63 mg Nebulization Q3H PRN Etta Quill, DO   0.63 mg at 07/11/14 0433  . lipase/protease/amylase (CREON) capsule 72,000 Units  72,000  Units Oral TID WC & HS Charlynne Cousins, MD   72,000 Units at 07/12/14 [phone]  . lisinopril (PRINIVIL,ZESTRIL) tablet 20 mg  20 mg Oral Daily Etta Quill, DO   20 mg at 07/11/14 0911  . loratadine (CLARITIN) tablet 10 mg  10 mg Oral Daily Etta Quill, DO   10 mg at 07/11/14 0911  . methylPREDNISolone sodium succinate (SOLU-MEDROL) 125 mg/2 mL injection 80 mg  80 mg Intravenous Q12H Charlynne Cousins, MD   80 mg at 07/12/14 1027  . metoprolol tartrate (LOPRESSOR) tablet 25 mg  25 mg Oral BID Etta Quill, DO   25 mg at 07/11/14 2309  . nitroGLYCERIN (NITROSTAT) SL tablet 0.4 mg  0.4 mg Sublingual Q5 Min x 3 PRN Etta Quill, DO      . pantoprazole (PROTONIX) EC tablet 40 mg  40 mg Oral Daily Etta Quill, DO   40 mg at 07/11/14 1000  . spironolactone (ALDACTONE) tablet 25 mg  25 mg Oral Daily Charlynne Cousins, MD   25 mg at 07/11/14 2536    PE: General appearance: alert, cooperative and no distress Lungs: bilateral diffuse rhonchi and expiratory wheezing Heart: regular rate and rhythm Extremities: trace-1+ bilateral LEE Pulses: 1+ and symmetric Skin: warm and dry Neurologic: Grossly normal  Lab Results:   Recent Labs  07/09/14 2113  WBC 8.2  HGB 10.1*  HCT 31.5*  PLT 253   BMET  Recent Labs  07/09/14 2113 07/11/14 0230  NA 139 137  K 4.0 4.5  CL 99 99  CO2 28 27  GLUCOSE 210* 263*  BUN 15 22  CREATININE 1.04 1.04  CALCIUM 9.3 8.5    Assessment/Plan  Principal Problem:   Ulcer of left lower extremity Active Problems:   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   CORONARY ARTERY DISEASE, S/P PTCA   ATHEROSLERO NATV ART EXTREM W/INTERMIT CLAUDICAT   Abdominal aneurysm without mention of rupture   CORONARY ARTERY BYPASS GRAFT, HX OF   HTN (hypertension)   Chronic systolic congestive heart failure   Secondary DM with complication   NSTEMI (non-ST elevated myocardial infarction)   COPD exacerbation   Atherosclerosis   Acute respiratory failure with hypoxia  1. Elevated troponin: Currently CP free. LBBB on EKG is chronic. He had a LHC in December 2014 demonstrating patient grafts. He has poorly controlled diabetes and ongoing tobacco abuse. For now, continue medical therapy with ASA, Plavix, BB, ACE and statin. Can also add low dose long acting nitrate. MD to follow with further recommendations.   2. COPD exacerbation: Per primary team  3. PAD: Non-healing wounds on legs with severe PAD. Vascular surgery following.   4. CAD: s/p CABG. See above  5. Tobacco abuse: ongoing, cessation advised.     LOS: 3 days   Brittainy M. Ladoris Gene 07/12/2014 9:26  AM  I have personally seen and examined this patient with Lyda Jester, PA-C. I agree with the assessment and plan as outlined above and I have edited the note above to reflect my additional A/P. He has severe CAD s/p CABG with 3 patents bypass grafts by cath December 2014. Agree that his troponin is likely due to demand ischemia in the setting of acute illness. (peak of 2.0 and trending down at last check). He has no complaints of chest pain at this time. EKG with chronic LBBB. Agree with conservative management of CAD at this time. We will follow with you.  MCALHANY,CHRISTOPHER 07/12/2014 11:07 AM

## 2014-07-12 NOTE — Progress Notes (Signed)
UR complete. Heena Woodbury RN CCM Case Mgmt phone [phone] 

## 2014-07-12 NOTE — Consult Note (Addendum)
[facility]  Subjective  - Still some pain left foot no real change otherwise   Objective 150/59 70 98 F (36.7 C) (Oral) 18 100%  Intake/Output Summary (Last 24 hours) at 07/12/14 1630 Last data filed at 07/12/14 1345  Gross per 24 hour  Intake    600 ml  Output   3650 ml  Net  -3050 ml   Feet cool bilaterally ulcers unchanged   Assessment/Planning: Severe iliofemoral occlusive disease Will schedule for arteriogram 08/05/14 as outpt. If pulmonary function is reasonable at that time.  FIELDS,CHARLES E 07/12/2014 4:30 PM --  Laboratory Lab Results:  Recent Labs  07/09/14 2113  WBC 8.2  HGB 10.1*  HCT 31.5*  PLT 253   BMET  Recent Labs  07/09/14 2113 07/11/14 0230  NA 139 137  K 4.0 4.5  CL 99 99  CO2 28 27  GLUCOSE 210* 263*  BUN 15 22  CREATININE 1.04 1.04  CALCIUM 9.3 8.5    COAG Lab Results  Component Value Date   INR 1.04 10/29/2013   INR 1.22 04/23/2010   INR 1.03 04/21/2010   No results found for this basename: PTT

## 2014-07-13 DIAGNOSIS — I70219 Atherosclerosis of native arteries of extremities with intermittent claudication, unspecified extremity: Secondary | ICD-10-CM

## 2014-07-13 DIAGNOSIS — R7989 Other specified abnormal findings of blood chemistry: Secondary | ICD-10-CM | POA: Diagnosis present

## 2014-07-13 DIAGNOSIS — R778 Other specified abnormalities of plasma proteins: Secondary | ICD-10-CM | POA: Diagnosis present

## 2014-07-13 DIAGNOSIS — I5023 Acute on chronic systolic (congestive) heart failure: Secondary | ICD-10-CM

## 2014-07-13 LAB — BASIC METABOLIC PANEL
ANION GAP: 15 (ref 5–15)
BUN: 31 mg/dL — ABNORMAL HIGH (ref 6–23)
CHLORIDE: 94 meq/L — AB (ref 96–112)
CO2: 29 meq/L (ref 19–32)
Calcium: 9.1 mg/dL (ref 8.4–10.5)
Creatinine, Ser: 1.04 mg/dL (ref 0.50–1.35)
GFR calc non Af Amer: 70 mL/min — ABNORMAL LOW (ref >90)
GFR, EST AFRICAN AMERICAN: 81 mL/min — AB (ref >90)
Glucose, Bld: 230 mg/dL — ABNORMAL HIGH (ref 70–99)
POTASSIUM: 4.1 meq/L (ref 3.7–5.3)
SODIUM: 138 meq/L (ref 137–147)

## 2014-07-13 LAB — GLUCOSE, CAPILLARY
GLUCOSE-CAPILLARY: 152 mg/dL — AB (ref 70–99)
GLUCOSE-CAPILLARY: 65 mg/dL — AB (ref 70–99)
GLUCOSE-CAPILLARY: 253 mg/dL — AB (ref 70–99)
GLUCOSE-CAPILLARY: 124 mg/dL — AB (ref 70–99)
GLUCOSE-CAPILLARY: 116 mg/dL — AB (ref 70–99)
Glucose-Capillary: 40 mg/dL — CL (ref 70–99)

## 2014-07-13 LAB — CBC
HEMATOCRIT: 35.7 % — AB (ref 39.0–52.0)
HEMOGLOBIN: 11.2 g/dL — AB (ref 13.0–17.0)
MCH: 29.6 pg (ref 26.0–34.0)
MCHC: 31.4 g/dL (ref 30.0–36.0)
MCV: 94.2 fL (ref 78.0–100.0)
Platelets: 298 10*3/uL (ref 150–400)
RBC: 3.79 MIL/uL — ABNORMAL LOW (ref 4.22–5.81)
RDW: 14.8 % (ref 11.5–15.5)
WBC: 8.6 10*3/uL (ref 4.0–10.5)

## 2014-07-13 MED ORDER — INSULIN GLARGINE 100 UNIT/ML ~~LOC~~ SOLN
20.0000 [IU] | Freq: Two times a day (BID) | SUBCUTANEOUS | Status: DC
  Administered 2014-07-13 – 2014-07-14 (×2): 20 [IU] via SUBCUTANEOUS
  Filled 2014-07-13 (×3): qty 0.2

## 2014-07-13 MED ORDER — METHYLPREDNISOLONE SODIUM SUCC 40 MG IJ SOLR
40.0000 mg | Freq: Two times a day (BID) | INTRAMUSCULAR | Status: DC
  Administered 2014-07-13 – 2014-07-14 (×2): 40 mg via INTRAVENOUS
  Filled 2014-07-13 (×4): qty 1

## 2014-07-13 NOTE — Progress Notes (Signed)
Physical Therapy Treatment Patient Details Name: Stephen [name] MRN: [id number] DOB: 04-24-1941 Today's Date: 07/13/2014    History of Present Illness Pt adm with LLE ulcer and SOB. PMH- DM, COPD, HTN, CAD    PT Comments    Patient progressing well with mobility however continues to exhibit impulsivity and balance deficits putting pt at increased risk for falls. Balance improved with use of RW however still recommend supervision for safety. Tolerated there ex in standing and gait training on RA and able to maintain Sa02 >90%. Pt refusing HHPT at this time. Will need to assess ability to negotiate steps tomorrow prior to discharge. Will follow up.   Follow Up Recommendations  Home health PT;Supervision/Assistance - 24 hour     Equipment Recommendations  None recommended by PT    Recommendations for Other Services       Precautions / Restrictions Precautions Precautions: Fall Restrictions Weight Bearing Restrictions: No    Mobility  Bed Mobility               General bed mobility comments: Sitting EOB upon PT arrival.   Transfers Overall transfer level: Needs assistance Equipment used: Rolling walker (2 wheeled);None Transfers: Sit to/from Stand Sit to Stand: Min guard         General transfer comment: Pt with posterior lean and almost LOB x2 upon standing requiring need to grab onto tray for stability. Cues for anterior translation and to wait a few minutes prior to walking to assess for symptoms of dizziness/weakness. Impulsive.  Ambulated on RA, Sa02 remained >90% during gait.  Ambulation/Gait Ambulation/Gait assistance: Min assist Ambulation Distance (Feet): 250 Feet Assistive device: Rolling walker (2 wheeled) Gait Pattern/deviations: Step-through pattern;Decreased stride length;Trunk flexed;Narrow base of support Gait velocity: decr Gait velocity interpretation: Below normal speed for age/gender General Gait Details: VC to stand more upright and for RW  proximity/safety. Pt with tendency to walk outside the walker especially during turns. Impulsive.    Stairs            Wheelchair Mobility    Modified Rankin (Stroke Patients Only)       Balance Overall balance assessment: Needs assistance   Sitting balance-Leahy Scale: Fair Sitting balance - Comments: Able to donn socks sitting EOB and reach outside BoS without difficulty.    Standing balance support: During functional activity Standing balance-Leahy Scale: Poor Standing balance comment: Requires UE support during static and dynamic standing due to balance deficits/weakness.                    Cognition Arousal/Alertness: Awake/alert Behavior During Therapy: Impulsive Overall Cognitive Status: Within Functional Limits for tasks assessed                      Exercises General Exercises - Lower Extremity Hip Flexion/Marching: Both;15 reps;Standing (BUE support. x2 sets.) Other Exercises Other Exercises: Standing hip extension/flexion 2x20 BLEs with BUE support.     General Comments General comments (skin integrity, edema, etc.): Pt with multiple ulcers along LLE distal to knee covered with bandages.      Pertinent Vitals/Pain Pain Assessment: No/denies pain    Home Living                      Prior Function            PT Goals (current goals can now be found in the care plan section) Progress towards PT goals: Progressing toward goals    Frequency  Min 3X/week    PT Plan Current plan remains appropriate    Co-evaluation             End of Session Equipment Utilized During Treatment: Gait belt Activity Tolerance: Patient tolerated treatment well Patient left: in bed;with call bell/phone within reach;with bed alarm set     Time: 1029-1056 PT Time Calculation (min): 27 min  Charges:  $Gait Training: 8-22 mins $Therapeutic Exercise: 8-22 mins                    G CodesCandy Sledge A 2014/07/22, 11:23  AM Candy Sledge, [place], DPT [phone]

## 2014-07-13 NOTE — Progress Notes (Signed)
Inpatient Diabetes Program Recommendations  AACE/ADA: New Consensus Statement on Inpatient Glycemic Control (2013)  Target Ranges:  Prepandial:   less than 140 mg/dL      Peak postprandial:   less than 180 mg/dL (1-2 hours)      Critically ill patients:  140 - 180 mg/dL   Reason for Visit: Hypoglycemia and Hyperglycemia  Results for Stephen [name], Stephen [name] (MRN [id number]) as of 07/13/2014 14:44  Ref. Range 07/12/2014 21:50 07/13/2014 07:55 07/13/2014 08:39 07/13/2014 09:26 07/13/2014 11:49  Glucose-Capillary Latest Range: 70-99 mg/dL 193 (H) 40 (LL) 65 (L) 152 (H) 253 (H)   Hypoglycemia with increased Lantus dose.  Reduced Lantus to 20 units bid. Post-prandial blood sugars elevated. Would benefit from small amount of basal insulin. Recommendations: Add meal coverage insulin - Novolog 4 units tidwc.  Will continue to follow. Thank you. Lorenda Peck, RD, LDN, CDE Inpatient Diabetes Coordinator [phone]

## 2014-07-13 NOTE — Progress Notes (Signed)
PATIENT DETAILS Name: Stephen [name] Age: 73 y.o. Sex: male Date of Birth: [birth year] Admit Date: 07/09/2014 Admitting Physician Etta Quill, DO IYM:EBRAX, Novella Olive, NT  Subjective: Feels better-requesting discharge.  Assessment/Plan: Principal Problem:  Suspected ischemic Ulcers of left lower extremity - Likely secondary to underlying peripheral vascular disease. - Seen by wound care- recommendations are for conservative wound care keep dry eschar, keep free from infectionand protected from further injury until and if vascular intervention is performed  Active Problems: Peripheral vascular disease - Workup including a CT angiogram-demonstrates severe iliofemoral occlusive disease-seen by vascular surgery this admission, since pulmonary status is still not optimal-outpatient workup recommended. Arteriogram scheduled on 08/05/14 by vascular surgery. In the meantime, will manage with aspirin, Plavix, antilipid medications.  Acute hypoxic respiratory failure - Likely secondary to COPD exacerbation, mild acute on chronic systolic heart failure. - Decrease steroids, continue nebulized bronchodilators, continue diuretics. Daily weights, strict intake and output  COPD exacerbation - Improving, claims much better than the past 2 days. Decrease Solu-Medrol to 40 mg, continue with doxycycline-Day 3.  Acute on chronic systolic heart failure EF 40% - Improving, continue with Lasix, lisinopril, metoprolol and Aldactone. Cardiology following.  Elevated troponin - Likely demand ischemia. - However has severe CAD s/p CABG- but recent cardiac catheterization on December 2014 showed patent grafts. - Recommending medical management at this time-continue with aspirin, Plavix, antilipid agents, beta blockers  Diabetes mellitus II with vascular complications: - Decrease Lantus to 20 units-hypoglycemic this morning. Continue to monitor CBGs closely.  Hypertension - BP controlled with  metoprolol, lisinopril and diuretic regimen. Follow BP trend.  History of chronic pancreatitis - Stable, no abdominal pain-no vomiting. Continue Creon.  History of dyslipidemia - Continue with statins and fenofibrate  Tobacco abuse - Counseled regarding importance of quitting.  Disposition: Remain inpatient  DVT Prophylaxis: Prophylactic Heparin   Code Status: Full code   Family Communication None at bedside  Procedures:  None  CONSULTS:  cardiology and vascular surgery  Time spent 40 minutes-which includes 50% of the time with face-to-face with patient/ family and coordinating care related to the above assessment and plan.    MEDICATIONS: Scheduled Meds: . aspirin EC  325 mg Oral q morning - 10a  . atorvastatin  40 mg Oral Daily  . budesonide-formoterol  2 puff Inhalation BID  . clopidogrel  75 mg Oral Daily  . doxycycline  100 mg Oral Q12H  . fenofibrate  160 mg Oral Q breakfast  . furosemide  40 mg Oral Daily  . gabapentin  300 mg Oral QHS  . glipiZIDE  2.5 mg Oral BID  . heparin  5,000 Units Subcutaneous 3 times per day  . insulin aspart  0-9 Units Subcutaneous TID WC  . insulin glargine  35 Units Subcutaneous BID  . levalbuterol  0.63 mg Nebulization Q6H  . lipase/protease/amylase  72,000 Units Oral TID WC & HS  . lisinopril  20 mg Oral Daily  . loratadine  10 mg Oral Daily  . metFORMIN  1,000 mg Oral BID WC  . methylPREDNISolone (SOLU-MEDROL) injection  40 mg Intravenous Q12H  . metoprolol tartrate  25 mg Oral BID  . pantoprazole  40 mg Oral Daily  . spironolactone  25 mg Oral Daily   Continuous Infusions:  PRN Meds:.HYDROcodone-acetaminophen, levalbuterol, nitroGLYCERIN  Antibiotics: Anti-infectives   Start     Dose/Rate Route Frequency Ordered Stop   07/12/14 1030  doxycycline (VIBRA-TABS) tablet 100 mg  100 mg Oral Every 12 hours 07/12/14 1028     07/11/14 0830  doxycycline (VIBRAMYCIN) 100 mg in dextrose 5 % 250 mL IVPB  Status:   Discontinued     100 mg 125 mL/hr over 120 Minutes Intravenous 2 times daily 07/11/14 0736 07/12/14 1028       PHYSICAL EXAM: Vital signs in last 24 hours: Filed Vitals:   07/12/14 1949 07/12/14 2155 07/13/14 0207 07/13/14 0543  BP:  152/60  158/62  Pulse:  77  67  Temp:  98.2 F (36.8 C)  97.8 F (36.6 C)  TempSrc:  Oral  Oral  Resp:  20  16  Height:      Weight:      SpO2: 100% 99% 98% 100%    Weight change:  Filed Weights   07/10/14 0807  Weight: 89.7 kg (197 lb 12 oz)   Body mass index is 27.59 kg/(m^2).   Gen Exam: Awake and alert with clear speech.  Not in any acute distress. Neck: Supple, No JVD.   Chest: Good air entry bilaterally-minimal rhonchi, few bibasilar rales. CVS: S1 S2 Regular, no murmurs.  Abdomen: soft, BS +, non tender, non distended.  Extremities: 1+ edema, lower extremities warm to touch. Numerous small dry ulcers noted in the left lower extremity. Neurologic: Non Focal.   Skin: No Rash.   Wounds: N/A.   Intake/Output from previous day:  Intake/Output Summary (Last 24 hours) at 07/13/14 1156 Last data filed at 07/13/14 1120  Gross per 24 hour  Intake    720 ml  Output   3800 ml  Net  -3080 ml     LAB RESULTS: CBC  Recent Labs Lab 07/09/14 2113  WBC 8.2  HGB 10.1*  HCT 31.5*  PLT 253  MCV 92.4  MCH 29.6  MCHC 32.1  RDW 15.5  LYMPHSABS 0.8  MONOABS 0.9  EOSABS 0.1  BASOSABS 0.0    Chemistries   Recent Labs Lab 07/09/14 2113 07/11/14 0230  NA 139 137  K 4.0 4.5  CL 99 99  CO2 28 27  GLUCOSE 210* 263*  BUN 15 22  CREATININE 1.04 1.04  CALCIUM 9.3 8.5    CBG:  Recent Labs Lab 07/12/14 1640 07/12/14 2150 07/13/14 0755 07/13/14 0839 07/13/14 0926  GLUCAP 213* 193* 40* 65* 152*    GFR Estimated Creatinine Clearance: 69.4 ml/min (by C-G formula based on Cr of 1.04).  Coagulation profile No results found for this basename: INR, PROTIME,  in the last 168 hours  Cardiac Enzymes  Recent Labs Lab  07/10/14 0611 07/10/14 1020 07/10/14 1633  TROPONINI 1.47* 2.07* 1.92*    No components found with this basename: POCBNP,  No results found for this basename: DDIMER,  in the last 72 hours No results found for this basename: HGBA1C,  in the last 72 hours No results found for this basename: CHOL, HDL, LDLCALC, TRIG, CHOLHDL, LDLDIRECT,  in the last 72 hours No results found for this basename: TSH, T4TOTAL, FREET3, T3FREE, THYROIDAB,  in the last 72 hours No results found for this basename: VITAMINB12, FOLATE, FERRITIN, TIBC, IRON, RETICCTPCT,  in the last 72 hours No results found for this basename: LIPASE, AMYLASE,  in the last 72 hours  Urine Studies No results found for this basename: UACOL, UAPR, USPG, UPH, UTP, UGL, UKET, UBIL, UHGB, UNIT, UROB, ULEU, UEPI, UWBC, URBC, UBAC, CAST, CRYS, UCOM, BILUA,  in the last 72 hours  MICROBIOLOGY: Recent Results (from the past 240 hour(s))  MRSA PCR SCREENING     Status: None   Collection Time    07/10/14  8:22 AM      Result Value Ref Range Status   MRSA by PCR NEGATIVE  NEGATIVE Final   Comment:            The GeneXpert MRSA Assay (FDA     approved for NASAL specimens     only), is one component of a     comprehensive MRSA colonization     surveillance program. It is not     intended to diagnose MRSA     infection nor to guide or     monitor treatment for     MRSA infections.    RADIOLOGY STUDIES/RESULTS: Ct Angio Ao+bifem W/cm &/or Wo/cm  07/11/2014   CLINICAL DATA:  Nonhealing ulcers within the lower extremities consistent with aortoiliac disease Evaluate for PAD.  EXAM: CT ANGIOGRAPHY OF ABDOMINAL AORTA WITH ILIOFEMORAL RUNOFF  TECHNIQUE: Multidetector CT imaging of the abdomen, pelvis and lower extremities was performed using the standard protocol during bolus administration of intravenous contrast. Multiplanar CT image reconstructions and MIPs were obtained to evaluate the vascular anatomy.  CONTRAST:  154mL OMNIPAQUE IOHEXOL 350  MG/ML SOLN  COMPARISON:  CT abdomen pelvis - 06/04/2011; chest radiograph - 07/10/2014  FINDINGS: Vascular Findings:  Abdominal aorta: There is a moderate to large amount of eccentric mixed calcified and noncalcified slightly irregular atherosclerotic plaque throughout the normal caliber abdominal aorta, not resulting in a hemodynamically significant stenosis. No abdominal aortic dissection or periaortic stranding.  Celiac artery: Widely patent without a hemodynamically significant stenosis. The left gastric artery is incidentally noted to have a separate origin to the right lateral aspect of the main celiac trunk. Otherwise, conventional branching pattern.  SMA: There is a minimal amount of eccentric mixed calcified and noncalcified atherosclerotic plaque involving the cranial aspect of the origin the SMA, not resulting in a hemodynamically significant stenosis. Additionally, there is a minimal amount of eccentric calcified plaque within in the mid aspect of the main trunk of the SMA (axial image 64, series 5, also not resulting in hemodynamically significant stenosis. Conventional branching pattern. The distal tributaries of the SMA are widely patent without discrete filling defect to suggest distal embolism.  Right Renal artery: Solitary; there is a mixture of calcified and noncalcified atherosclerotic plaque involving the origin of the right renal artery, not resulting in a hemodynamically significant stenosis.  Left Renal artery: Solitary ; there is a very minimal amount of eccentric predominantly calcified atherosclerotic plaque involving the origin of the left renal artery, not resulting in hemodynamically significant stenosis.  IMA: Widely patent.   --------------------------------------------------------------------------------  RIGHT LOWER EXTREMITY VASCULATURE:  Right-sided pelvic vasculature: There is a moderate amount of mixed calcified and noncalcified atherosclerotic plaque throughout the right common  iliac artery, not resulting in hemodynamically significant stenosis. The right internal iliac artery is heavily disease though patent and of normal caliber. There is a large amount of mixed calcified and noncalcified atherosclerotic plaque throughout the proximal aspect of the right external iliac artery resulting in tandem areas of high-grade (grade in 70% stenosis (representative images axial image 106, series 5, coronal images 90 and 93, series 10).  Right common femoral artery: There is a mixture of calcified and noncalcified atherosclerotic plaque within the right common femoral artery which results in approximately 50% luminal narrowing (image 129, series 5  Right deep femoral artery: Disease proximally though patent and supplying the majority of the blood flow to  the right lower extremity.  Right superficial femoral artery: Occluded at its origin with reconstituted atretic flow through its mid aspect (representative axial image 179, series 5) with eventual occlusion again distally (axial image 234, series 5.  Right popliteal artery: There is reconstitution of the right above knee popliteal artery via deep thigh and geniculate collaterals. There is a mixture of calcified and noncalcified atherosclerotic plaque throughout the right above knee popliteal artery which results in multiple areas of high-grade (> 70%) luminal narrowing. The right below-knee popliteal artery is widely patent.  Right lower leg: There is eccentric calcified atherosclerotic plaque involving the proximal anterior tibial and tibioperoneal trunks, however there is a preserved three-vessel runoff to the right foot. The right dorsalis pedis artery is patent to the level of the forefoot.   ---------------------------------------------------------------------------------  LEFT LOWER EXTREMITY VASCULATURE:  Left-sided pelvic vasculature: There is eccentric calcified and noncalcified atherosclerotic plaque throughout the left external iliac  artery resulting in tandem long segment areas of high-grade (greater 70%) luminal narrowing (representative axial images 91 and 97, series 5, coronal image 92, series 10), though note, intraluminal evaluation is degraded secondary to mural calcification. The left internal iliac artery is heavily diseased though patent and of normal caliber. There is eccentric mixed calcified and noncalcified atherosclerotic plaque throughout the left external iliac artery resulting in tandem long segment areas of high-grade (greater 70%) luminal narrowing).  Left common femoral artery: There is a mixture of calcified and noncalcified atherosclerotic plaque within the left common femoral artery which results an approximately 50% luminal narrowing (image 130, series 5).  Left deep femoral artery: Disease status origin though patent supplying the majority the blood flow to the left leg.  Left superficial femoral artery: There is a very minimal amount of atretic flow involving the very proximal aspect of the left superficial femoral artery. The vessel as otherwise occluded throughout its course through the level of the adductor canal.  Left popliteal artery: There is reconstitution of flow of the left above knee popliteal artery at via deep thigh an geniculate collaterals. There is eccentric mixed calcified and noncalcified atherosclerotic plaque throughout the left above knee popliteal artery resulting in tandem areas of hemodynamically significant stenosis. The left below-knee popliteal artery is widely patent without hemodynamically significant stenosis.  Left lower leg: There is eccentric atherosclerotic plaque involving the takeoff and proximal aspect of the left anterior tibial artery as well as the distal tibioperoneal trunk, however there is a preserved three-vessel runoff to the left lower leg and foot. The left-sided dorsalis pedis artery is patent to the level of the forefoot.  Review of the MIP images confirms the above  findings.   --------------------------------------------------------------------------------  Nonvascular Findings:  Evaluation of the abdominal organs is limited to the arterial phase of enhancement.  Normal hepatic contour. No discrete hyperenhancing hepatic lesions. Punctate calcification within the caudal aspect of the right lobe of the liver (image 57, series 5, is unchanged in favored to the sequela of prior granulomatous infection and/or injury. No ascites.  There is symmetric enhancement of the bilateral kidneys. Note is made of an approximately 1.2 cm partially exophytic hypo attenuating (17 Hounsfield unit) cyst arising from the posterior aspect of the inferior pole the right kidney (axial image 68, series 5). No discrete left-sided renal lesions. No definite renal stones on this postcontrast examination. No urinary obstruction. Minimal amount of grossly symmetric bilateral perinephric stranding. Normal appearance of the bilateral adrenal glands and spleen. Stable sequela of chronic calcific pancreatitis. No peripancreatic stranding  or definitive pancreatic ductal dilatation on this non pancreatic protocol CT.  Scattered minimal colonic diverticulosis without evidence of diverticulitis. Moderate colonic stool burden without evidence of obstruction. No discrete area of bowel wall thickening. Normal appearance of the appendix. No pneumoperitoneum, pneumatosis or portal venous gas.  Prostate is enlarged with mass effect upon the urinary bladder. Normal appearance of the urinary bladder given degree distention. No free fluid within the pelvic cul-de-sac. Incidentally noted bilateral moderate to large sized hydroceles.  Limited visualization of the lower thorax demonstrates small bilateral effusions with associated bibasilar heterogeneous opacities, likely atelectatic left cyst. There is minimal subsegmental atelectasis within the imaged caudal aspects of the right middle lobe and lingula. There is mild diffuse  bronchial wall thickening within the imaged bilateral lower lobe bronchi.  Borderline cardiomegaly, in particular, there is enlargement of the left ventricle. Post median sternotomy with atherosclerotic plaque within the native coronary arteries.  No acute or aggressive osseous abnormalities. Mild degenerate change of the bilateral hips.  There is mild diffuse body wall anasarca.  IMPRESSION: Vascular Impression:  1. Moderate to large amount of mixed calcified and noncalcified slightly irregular atherosclerotic plaque with a normal caliber abdominal aorta, not resulting in a hemodynamically significant stenosis. 2. The major branch vessels of the abdominal aorta (including the IMA) appear patent without a hemodynamically significant stenosis. Right Lower Extremity Vascular Impression:  1. Eccentric mixed calcified and noncalcified atherosclerotic plaque results in tandem areas of high-grade (> 70%) luminal narrowing throughout the proximal aspect of the right external iliac artery. 2. Eccentric mixed calcified and noncalcified atherosclerotic plaque results in approximately 50% luminal narrowing of the right common femoral artery. 3. The right superficial femoral artery is largely occluded throughout its course with very minimal atretic throw through its mid aspect. 4. The right above knee popliteal artery is reconstituted via deep thigh and geniculate collaterals though is heavily diseased. The right below-knee popliteal artery is widely patent. 5. Eccentric calcified plaque involving the proximal aspects of the anterior tibial artery and tibial peritoneal trunk, however there is a preserved three-vessel runoff to the right lower leg and foot. The right dorsalis pedis artery is patent to the level of the forefoot. Left Lower Extremity Vascular Impression:  1. Eccentric mixed calcified and noncalcified irregular atherosclerotic plaque results in long segment high-grade (greater 70%) luminal narrowing throughout the  left common and external iliac arteries. 2. Eccentric mixed calcified and noncalcified atherosclerotic plaque results in approximately 50% luminal narrowing of the left common femoral artery. 3. Very minimal atretic flow involving the very proximal aspect of the left superficial femoral artery. The left superficial femoral artery it is otherwise occluded throughout its course through the level of the adductor canal. 4. The left above knee popliteal artery is reconstituted via deep thigh and geniculate collaterals those heavily diseased. The left below-knee popliteal artery is widely patent. 5. There are several areas of eccentric calcified atherosclerotic plaque involving the anterior tibial and tibioperoneal trunk, however there is a preserved three-vessel runoff to the left lower leg and foot. The left dorsalis pedis artery is patent to the level of the forefoot. Non vascular Impression:  1. Cardiomegaly. Post median sternotomy with atherosclerotic plaque within the native coronary arteries. 2. Suspected pulmonary edema with small bilateral effusions and mild diffuse body wall anasarca/third-spacing. Clinical correlation is advised. 3. Bronchial wall thickening suggestive of airways disease/bronchitis. 4. Instilling noted moderate to large size bilateral hydroceles. 5. Stable sequela of chronic calcific pancreatitis. No definitive peripancreatic stranding to suggest a  superimposed acute process.   Electronically Signed   By: Sandi Mariscal M.D.   On: 07/11/2014 08:21   US Aorta  07/10/2014   CLINICAL DATA:  Coronary artery disease. Peripheral vascular disease. Abdominal aortic aneurysm.  EXAM: ULTRASOUND OF ABDOMINAL AORTA  TECHNIQUE: Ultrasound examination of the abdominal aorta was performed to evaluate for abdominal aortic aneurysm.  COMPARISON:  CT lumbar spine 03/13/2014  FINDINGS: Abdominal Aorta  No aneurysm identified.  Maximum AP  Diameter:  1.9 cm  Maximum TRV  Diameter: 2 cm  Atherosclerotic  calcification is noted. Iliac arteries are obscured.  IMPRESSION: No abdominal aortic aneurysm. Maximal AP diameter is 1.9 cm. Normal caliber of the abdominal aorta.   Electronically Signed   By: Lucienne Capers M.D.   On: 07/10/2014 06:06   Dg Chest Port 1 View  07/10/2014   CLINICAL DATA:  Shortness of breath. Diabetes. Hypertension. Coronary artery disease.  EXAM: PORTABLE CHEST - 1 VIEW  COMPARISON:  06/28/2014  FINDINGS: The heart size and mediastinal contours are within normal limits. Both lungs are clear. No evidence of pleural effusion. Prior CABG again noted.  IMPRESSION: No active disease.   Electronically Signed   By: Earle Gell M.D.   On: 07/10/2014 00:26   Dg Chest Portable 1 View  06/28/2014   CLINICAL DATA:  Three day history of chest pain. Chronic bilateral lower extremity edema. Current history of diabetes, hypertension and COPD. Current smoker. Prior CABG.  EXAM: PORTABLE CHEST - 1 VIEW  COMPARISON:  Two-view chest x-ray 10/30/2013, 10/27/2013, 02/06/2011.  FINDINGS: Prior sternotomy for CABG. Cardiac silhouette upper normal in size. Mild diffuse interstitial pulmonary edema superimposed upon baseline changes of COPD/emphysema. No confluent airspace consolidation. No visible pleural effusions.  IMPRESSION: Mild CHF and/or fluid overload, with mild diffuse interstitial pulmonary edema. COPD/emphysema.   Electronically Signed   By: Evangeline Dakin M.D.   On: 06/28/2014 17:14    Oren Binet, MD  [facility] Pager:[phone]  If 7PM-7AM, please contact night-coverage [email or web address] Password [email or web address] 07/13/2014, 11:56 AM   LOS: 4 days   **Disclaimer: This note may have been dictated with voice recognition software. Similar sounding words can inadvertently be transcribed and this note may contain transcription errors which may not have been corrected upon publication of note.**

## 2014-07-13 NOTE — Progress Notes (Signed)
Subjective: Had some slight chest pain this AM. Fleeting. Also with LE edema and orthopnea.   Objective: Vital signs in last 24 hours: Temp:  [97.8 F (36.6 C)-98.2 F (36.8 C)] 97.8 F (36.6 C) (09/09 0543) Pulse Rate:  [67-77] 67 (09/09 0543) Resp:  [16-20] 16 (09/09 0543) BP: (150-158)/(59-62) 158/62 mmHg (09/09 0543) SpO2:  [94 %-100 %] 100 % (09/09 0543) Last BM Date: 07/11/14  Intake/Output from previous day: 09/08 0701 - 09/09 0700 In: 840 [P.O.:840] Out: 3250 [Urine:3250] Intake/Output this shift: Total I/O In: 240 [P.O.:240] Out: 600 [Urine:600]  Medications Current Facility-Administered Medications  Medication Dose Route Frequency Provider Last Rate Last Dose  . aspirin EC tablet 325 mg  325 mg Oral q morning - 10a Etta Quill, DO   325 mg at 07/12/14 1127  . atorvastatin (LIPITOR) tablet 40 mg  40 mg Oral Daily Etta Quill, DO   40 mg at 07/12/14 1126  . budesonide-formoterol (SYMBICORT) 160-4.5 MCG/ACT inhaler 2 puff  2 puff Inhalation BID Etta Quill, DO   2 puff at 07/13/14 0904  . clopidogrel (PLAVIX) tablet 75 mg  75 mg Oral Daily Etta Quill, DO   75 mg at 07/12/14 1127  . doxycycline (VIBRA-TABS) tablet 100 mg  100 mg Oral Q12H Charlynne Cousins, MD   100 mg at 07/12/14 2139  . fenofibrate tablet 160 mg  160 mg Oral Q breakfast Etta Quill, DO   160 mg at 07/13/14 0805  . furosemide (LASIX) tablet 40 mg  40 mg Oral Daily Charlynne Cousins, MD   40 mg at 07/12/14 1127  . gabapentin (NEURONTIN) capsule 300 mg  300 mg Oral QHS Etta Quill, DO   300 mg at 07/12/14 2140  . glipiZIDE (GLUCOTROL XL) 24 hr tablet 2.5 mg  2.5 mg Oral BID Charlynne Cousins, MD   2.5 mg at 07/12/14 2216  . heparin injection 5,000 Units  5,000 Units Subcutaneous 3 times per day Etta Quill, DO   5,000 Units at 07/13/14 [phone]  . HYDROcodone-acetaminophen (NORCO/VICODIN) 5-325 MG per tablet 1 tablet  1 tablet Oral Q4H PRN Jeryl Columbia, NP   1  tablet at 07/12/14 2326  . insulin aspart (novoLOG) injection 0-9 Units  0-9 Units Subcutaneous TID WC Charlynne Cousins, MD   3 Units at 07/12/14 1752  . insulin glargine (LANTUS) injection 35 Units  35 Units Subcutaneous BID Charlynne Cousins, MD   35 Units at 07/12/14 2216  . levalbuterol (XOPENEX) nebulizer solution 0.63 mg  0.63 mg Nebulization Q6H Etta Quill, DO   0.63 mg at 07/13/14 0903  . levalbuterol (XOPENEX) nebulizer solution 0.63 mg  0.63 mg Nebulization Q3H PRN Etta Quill, DO   0.63 mg at 07/11/14 0433  . lipase/protease/amylase (CREON) capsule 72,000 Units  72,000 Units Oral TID WC & HS Charlynne Cousins, MD   72,000 Units at 07/13/14 0804  . lisinopril (PRINIVIL,ZESTRIL) tablet 20 mg  20 mg Oral Daily Etta Quill, DO   20 mg at 07/12/14 1126  . loratadine (CLARITIN) tablet 10 mg  10 mg Oral Daily Etta Quill, DO   10 mg at 07/12/14 1126  . metFORMIN (GLUCOPHAGE) tablet 1,000 mg  1,000 mg Oral BID WC Charlynne Cousins, MD   1,000 mg at 07/13/14 0804  . methylPREDNISolone sodium succinate (SOLU-MEDROL) 40 mg/mL injection 40 mg  40 mg Intravenous Q12H Shanker Kristeen Mans, MD      .  metoprolol tartrate (LOPRESSOR) tablet 25 mg  25 mg Oral BID Etta Quill, DO   25 mg at 07/12/14 2216  . nitroGLYCERIN (NITROSTAT) SL tablet 0.4 mg  0.4 mg Sublingual Q5 Min x 3 PRN Etta Quill, DO      . pantoprazole (PROTONIX) EC tablet 40 mg  40 mg Oral Daily Etta Quill, DO   40 mg at 07/12/14 1127  . spironolactone (ALDACTONE) tablet 25 mg  25 mg Oral Daily Charlynne Cousins, MD   25 mg at 07/12/14 1126    PE: General appearance: alert, cooperative and no distress Lungs: bilateral diffuse rhonchi and expiratory wheezing Heart: regular rate and rhythm Extremities: trace-1+ bilateral LEE Pulses: 1+ and symmetric Skin: warm and dry Neurologic: Grossly normal  Lab Results:  No results found for this basename: WBC, HGB, HCT, PLT,  in the last 72  hours BMET  Recent Labs  07/11/14 0230  NA 137  K 4.5  CL 99  CO2 27  GLUCOSE 263*  BUN 22  CREATININE 1.04  CALCIUM 8.5    Assessment/Plan  Principal Problem:   Ulcer of left lower extremity Active Problems:   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   CORONARY ARTERY DISEASE, S/P PTCA   ATHEROSLERO NATV ART EXTREM W/INTERMIT CLAUDICAT   Abdominal aneurysm without mention of rupture   CORONARY ARTERY BYPASS GRAFT, HX OF   HTN (hypertension)   Chronic systolic congestive heart failure   Secondary DM with complication   NSTEMI (non-ST elevated myocardial infarction)   COPD exacerbation   Atherosclerosis   Acute respiratory failure with hypoxia  Stephen [name] is a 73 yo man with PMH of T2DM, PVD, dyslipidemia, CAD with CABG in 6/11 with recent LHC in 10/2013 demonstrating patent grafts with moderate LVD (EF 40%), COPD with continued tobacco use (multiple packs per day), who presented with left leg pain (venous duplex negative for DVT). PVD suspected. Vascular surgery following. Cardiology actually consulted for initial troponin of 0.4 (peaked at 2.07) and ECG with chonric LBBB.   Elevated troponin/CAD: He has severe CAD s/p CABG with 3 patents bypass grafts by cath December 2014. Agree that his troponin is likely due to demand ischemia in the setting of acute illness. (peak of 2.0 and trending down at last check 2.07-->1.92). He has no complaints of chest pain at this time. EKG with chronic LBBB. Agree with conservative management of CAD at this time. We will follow with you.  -- Continue medical therapy with ASA, Plavix, BB, ACE and statin.    COPD exacerbation: Per primary team  PAD: Non-healing wounds on legs with severe PAD. Vascular surgery following.   Tobacco abuse: ongoing, cessation advised.   Chronic systolic CHF -- CXR clear. Neck veins not elevated. However, BNP 1466 and LE swelling. He also does have orthopnea. Net neg 5.7 L -- Continue Lasix 40 mg po qd.     No new lab work since 07/10/14. Will add a BMET and CBC   LOS: 4 days   Eileen Stanford 07/13/2014 9:41 AM  I have personally seen and examined this patient with Angelena Form, PA-C. I agree with the assessment and plan as outlined above. No ischemic workup at this time. Continue diuresis with po Lasix. Plans for outpatient LE angiogram per Dr. [name]. Continue cardiac meds.   Pietrina Jagodzinski 07/13/2014 10:16 AM

## 2014-07-14 DIAGNOSIS — I2489 Other forms of acute ischemic heart disease: Secondary | ICD-10-CM

## 2014-07-14 DIAGNOSIS — I248 Other forms of acute ischemic heart disease: Secondary | ICD-10-CM

## 2014-07-14 MED ORDER — GUAIFENESIN ER 600 MG PO TB12
600.0000 mg | ORAL_TABLET | Freq: Two times a day (BID) | ORAL | Status: DC
  Administered 2014-07-14: 600 mg via ORAL
  Filled 2014-07-14 (×2): qty 1

## 2014-07-14 MED ORDER — LISINOPRIL 40 MG PO TABS
40.0000 mg | ORAL_TABLET | Freq: Every day | ORAL | Status: DC
  Administered 2014-07-14: 40 mg via ORAL
  Filled 2014-07-14: qty 1

## 2014-07-14 MED ORDER — HYDRALAZINE HCL 20 MG/ML IJ SOLN: 10.0000 mg | Freq: Four times a day (QID) | INTRAMUSCULAR | Status: DC | PRN

## 2014-07-14 MED ORDER — LEVALBUTEROL HCL 1.25 MG/3ML IN NEBU: 0.6300 mg | INHALATION_SOLUTION | RESPIRATORY_TRACT | Status: DC | PRN

## 2014-07-14 MED ORDER — PREDNISONE 5 MG PO TABS: ORAL_TABLET | ORAL | Status: DC

## 2014-07-14 MED ORDER — INSULIN GLARGINE 100 UNIT/ML SOLOSTAR PEN: 20.0000 [IU] | PEN_INJECTOR | Freq: Every day | SUBCUTANEOUS | Status: DC

## 2014-07-14 MED ORDER — DOXYCYCLINE HYCLATE 100 MG PO CAPS: 100.0000 mg | ORAL_CAPSULE | Freq: Two times a day (BID) | ORAL | Status: DC

## 2014-07-14 MED ORDER — LISINOPRIL 20 MG PO TABS: 40.0000 mg | ORAL_TABLET | Freq: Every day | ORAL | Status: DC

## 2014-07-14 MED ORDER — GUAIFENESIN ER 600 MG PO TB12: 600.0000 mg | ORAL_TABLET | Freq: Two times a day (BID) | ORAL | Status: DC

## 2014-07-14 MED ORDER — TIOTROPIUM BROMIDE MONOHYDRATE 18 MCG IN CAPS: 18.0000 ug | ORAL_CAPSULE | Freq: Every day | RESPIRATORY_TRACT | Status: DC

## 2014-07-14 NOTE — Progress Notes (Signed)
Physical Therapy Treatment Patient Details Name: Stephen [name] MRN: [id number] DOB: [birth year] Today's Date: 07/14/2014    History of Present Illness Pt adm with LLE ulcer and SOB. PMH- DM, COPD, HTN, CAD    PT Comments    Patient progressing well with mobility. Tolerated stair negotiation with increased difficulty requiring Min A to ascend steps secondary to weakness. Education provided to have son assist patient up steps upon entering home for safety. Pt agreeable. Pt continues to be impulsive and exhibits balance deficits. Recommend use of RW at all times during mobility for safety. Dyspnea present however vitals WFL throughout session. Will continue to follow and progress.   Follow Up Recommendations  Home health PT;Supervision/Assistance - 24 hour     Equipment Recommendations  None recommended by PT    Recommendations for Other Services       Precautions / Restrictions Precautions Precautions: Fall Restrictions Weight Bearing Restrictions: No    Mobility  Bed Mobility               General bed mobility comments: Sitting EOB upon PT arrival.   Transfers Overall transfer level: Needs assistance Equipment used: Rolling walker (2 wheeled);None Transfers: Sit to/from Stand Sit to Stand: Min guard         General transfer comment: Min guard for safety as pt impulsive and will attempt to ambulate without RW.   Ambulation/Gait Ambulation/Gait assistance: Min assist Ambulation Distance (Feet): 150 Feet (+150' with 1 seated rest break.) Assistive device: Rolling walker (2 wheeled) Gait Pattern/deviations: Step-through pattern;Decreased stride length;Trunk flexed Gait velocity: decr   General Gait Details: VC to stand erect and for RW proximity. Pt fatiques quickly as demonstrated by trembling of BLEs during gait training. Frequent rest breaks required due to weakness in BLEs and pt reporting feeling like "they are going to go down." Sa02 remained >94% on  RA.   Stairs Stairs: Yes Stairs assistance: Min assist Stair Management: One rail Right;Step to pattern Number of Stairs: 3 General stair comments: VC for sequencing and technique. Min A as pt with difficulty ascending steps due to weakness. Use of BUEs on 1 rail for stability/assist.   Wheelchair Mobility    Modified Rankin (Stroke Patients Only)       Balance Overall balance assessment: Needs assistance   Sitting balance-Leahy Scale: Fair Sitting balance - Comments: Able to donn socks sitting EOB and reach outside BoS without difficulty.    Standing balance support: During functional activity Standing balance-Leahy Scale: Poor Standing balance comment: Requires UE support during dynamic standing due to balance deficits/impulsivity and weakness. Able to ambulate short distances within room holding onto furniture for support. UNsteady without RW.                    Cognition Arousal/Alertness: Awake/alert Behavior During Therapy: Impulsive Overall Cognitive Status: Within Functional Limits for tasks assessed                      Exercises Other Exercises Other Exercises: Standing push ups on sink 2x10. Other Exercises: Standing ham string curls BLEs 2x10 with BUE support.     General Comments        Pertinent Vitals/Pain Pain Assessment: No/denies pain    Home Living                      Prior Function            PT Goals (current goals can now  be found in the care plan section) Progress towards PT goals: Progressing toward goals    Frequency  Min 3X/week    PT Plan Current plan remains appropriate    Co-evaluation             End of Session Equipment Utilized During Treatment: Gait belt Activity Tolerance: Patient tolerated treatment well Patient left: in bed;with call bell/phone within reach;with bed alarm set     Time: 5670-1410 PT Time Calculation (min): 35 min  Charges:  $Gait Training: 8-22 mins $Therapeutic  Exercise: 8-22 mins                    G CodesCandy Sledge A 07/25/14, 10:59 AM Candy Sledge, PT, DPT [phone]

## 2014-07-14 NOTE — Discharge Summary (Signed)
PATIENT DETAILS Name: Stephen [name] Age: 73 y.o. Sex: male Date of Birth: 1941-05-18 MRN: [id number]. Admitting Physician: Etta Quill, DO EHM:CNOBS, Novella Olive, NT  Admit Date: 07/09/2014 Discharge date: 07/14/2014  Recommendations for Outpatient Follow-up:  1. Will need optimization of presumed COPD medications 2. Will need ongoing counseling regarding tobacco cessation 3. He is scheduled for arteriogram of bilateral lower extremities on 10/1 with [facility]- will need further optimization of his pulmonary status by then. 4. 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 Diabetes mellitus   . Hypertension   . Pancreatitis   . Neuropathy   . Hyperlipemia   . Peripheral vascular disease     a. 10/2013: severely abnormal ABIs, for OP evaluation.  [name] GERD (gastroesophageal reflux disease)   . S/P colonoscopy July 2003    hyperplastic polyp, diverticulosis, anal papilla and internal hemorrhoids r  . S/P endoscopy 2003    Dr. Braulio Bosch: erosive reflux esophagitis , PUD  . PUD (peptic ulcer disease)     diagnosed via EGD by Dr. Braulio Bosch at [place]  . Diverticulosis   . Hemorrhoids   . Coronary artery disease      a. BMS to LAD 1999. b. HSRA to LAD and stent to RCA 2007.  c. NSTEMI/CABG x 3 in 04/2010. d. NSTEMI 10/2013 - secondary to demand ischemia in setting of COPD exacerbation with severe underlying CAD - cath with 3/3 patent grafts, for medical therapy.   [name] COPD (chronic obstructive pulmonary disease)     a. Ongoing tobacco abuse.  . Arthritis   . Neutropenia     a. Dx 10/2013 on labs - instructed to f/u PCP.  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   . Ischemic cardiomyopathy     a. previously EF 30% in 2011. b. 10/2013: EF 45-50% by echo.  . Alcoholism     a. Remote alcoholism  . 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    DISCHARGE MEDICATIONS:   Medication List         albuterol-ipratropium 18-103 MCG/ACT inhaler  Commonly known as:  COMBIVENT  Inhale 1-2 puffs into the lungs every 6 (six) hours as needed for wheezing or shortness of breath.     aspirin EC 325 MG tablet  Take 325 mg by mouth every morning.     atorvastatin 40 MG tablet  Commonly known as:  LIPITOR  Take 40 mg by mouth daily.     clopidogrel 75 MG tablet  Commonly known as:  PLAVIX  Take 75 mg by mouth daily.     CREON 24000 UNITS Cpep  Generic  drug:  Pancrelipase (Lip-Prot-Amyl)  Take 3 capsules by mouth 4 (four) times daily. To take three to four times daily (PER PHARMACY RECORD)     doxycycline 100 MG capsule  Commonly known as:  VIBRAMYCIN  Take 1 capsule (100 mg total) by mouth 2 (two) times daily.     fenofibrate 160 MG tablet  Take 160 mg by mouth daily with breakfast.     furosemide 20 MG tablet  Commonly known as:  LASIX  Take 2 tablets (40 mg total) by mouth daily.     gabapentin 100 MG capsule  Commonly known as:  NEURONTIN  Take 300 mg by mouth at bedtime.     glipiZIDE 2.5 MG 24 hr tablet  Commonly known as:  GLUCOTROL XL  Take 2.5 mg by mouth 2 (two) times daily.     guaiFENesin 600 MG 12 hr tablet  Commonly known as:   MUCINEX  Take 1 tablet (600 mg total) by mouth 2 (two) times daily.     Insulin Glargine 100 UNIT/ML Solostar Pen  Commonly known as:  LANTUS SOLOSTAR  Inject 20 Units into the skin daily at 10 pm.     levalbuterol 1.25 MG/3ML nebulizer solution  Commonly known as:  XOPENEX  Take 0.63 mg by nebulization every 4 (four) hours as needed for wheezing or shortness of breath.     lisinopril 20 MG tablet  Commonly known as:  PRINIVIL,ZESTRIL  Take 2 tablets (40 mg total) by mouth daily.     loratadine 10 MG tablet  Commonly known as:  CLARITIN  Take 10 mg by mouth daily.     metFORMIN 1000 MG tablet  Commonly known as:  GLUCOPHAGE  Take 1,000 mg by mouth 2 (two) times daily with a meal.     metoprolol tartrate 25 MG tablet  Commonly known as:  LOPRESSOR  Take 25 mg by mouth 2 (two) times daily.     nitroGLYCERIN 0.4 MG SL tablet  Commonly known as:  NITROSTAT  Place 0.4 mg under the tongue every 5 (five) minutes x 3 doses as needed.     pantoprazole 40 MG tablet  Commonly known as:  PROTONIX  Take 1 tablet (40 mg total) by mouth daily.     predniSONE 5 MG tablet  Commonly known as:  DELTASONE  Label  & dispense according to the schedule below. 10 Pills PO for 3 days then, 8 Pills PO for 3 days, 6 Pills PO for 3 days, 4 Pills PO for 3 days, 2 Pills PO for 3 days, 1 Pills PO for 3 days, 1/2 Pill  PO for 3 days then STOP. Total 95 pills.     spironolactone 25 MG tablet  Commonly known as:  ALDACTONE  Take 25 mg by mouth daily.     SYMBICORT 160-4.5 MCG/ACT inhaler  Generic drug:  budesonide-formoterol  Inhale 2 puffs into the lungs 2 (two) times daily.     tiotropium 18 MCG inhalation capsule  Commonly known as:  SPIRIVA HANDIHALER  Place 1 capsule (18 mcg total) into inhaler and inhale daily.        ALLERGIES:   Allergies  Allergen Reactions  . Levaquin [Levofloxacin] Hives    BRIEF HPI:  See H&P, Labs, Consult and Test reports for all details in brief, patient is  a 73 year old male with a history of coronary artery disease, presumed peripheral vascular disease who presented to the emergency room for evaluation of numerous lower extremity ulceration and claudication pain.  He also is complaining of 2 day history of worsening shortness of breath, wheezing and cough. He was admitted for further evaluation and treatment  CONSULTATIONS:   cardiology and vascular surgery  PERTINENT RADIOLOGIC STUDIES: Ct Angio Ao+bifem W/cm &/or Wo/cm  07/11/2014   CLINICAL DATA:  Nonhealing ulcers within the lower extremities consistent with aortoiliac disease Evaluate for PAD.  EXAM: CT ANGIOGRAPHY OF ABDOMINAL AORTA WITH ILIOFEMORAL RUNOFF  TECHNIQUE: Multidetector CT imaging of the abdomen, pelvis and lower extremities was performed using the standard protocol during bolus administration of intravenous contrast. Multiplanar CT image reconstructions and MIPs were obtained to evaluate the vascular anatomy.  CONTRAST:  137mL OMNIPAQUE IOHEXOL 350 MG/ML SOLN  COMPARISON:  CT abdomen pelvis - 06/04/2011; chest radiograph - 07/10/2014  FINDINGS: Vascular Findings:  Abdominal aorta: There is a moderate to large amount of eccentric mixed calcified and noncalcified slightly irregular atherosclerotic plaque throughout the normal caliber abdominal aorta, not resulting in a hemodynamically significant stenosis. No abdominal aortic dissection or periaortic stranding.  Celiac artery: Widely patent without a hemodynamically significant stenosis. The left gastric artery is incidentally noted to have a separate origin to the right lateral aspect of the main celiac trunk. Otherwise, conventional branching pattern.  SMA: There is a minimal amount of eccentric mixed calcified and noncalcified atherosclerotic plaque involving the cranial aspect of the origin the SMA, not resulting in a hemodynamically significant stenosis. Additionally, there is a minimal amount of eccentric calcified plaque within in the mid  aspect of the main trunk of the SMA (axial image 64, series 5, also not resulting in hemodynamically significant stenosis. Conventional branching pattern. The distal tributaries of the SMA are widely patent without discrete filling defect to suggest distal embolism.  Right Renal artery: Solitary; there is a mixture of calcified and noncalcified atherosclerotic plaque involving the origin of the right renal artery, not resulting in a hemodynamically significant stenosis.  Left Renal artery: Solitary ; there is a very minimal amount of eccentric predominantly calcified atherosclerotic plaque involving the origin of the left renal artery, not resulting in hemodynamically significant stenosis.  IMA: Widely patent.   --------------------------------------------------------------------------------  RIGHT LOWER EXTREMITY VASCULATURE:  Right-sided pelvic vasculature: There is a moderate amount of mixed calcified and noncalcified atherosclerotic plaque throughout the right common iliac artery, not resulting in hemodynamically significant stenosis. The right internal iliac artery is heavily disease though patent and of normal caliber. There is a large amount of mixed calcified and noncalcified atherosclerotic plaque throughout the proximal aspect of the right external iliac artery resulting in tandem areas of high-grade (grade in 70% stenosis (representative images axial image 106, series 5, coronal images 90 and 93, series 10).  Right common femoral artery: There is a mixture of calcified and noncalcified atherosclerotic plaque within the right common femoral artery which results in approximately 50% luminal narrowing (image 129, series 5  Right deep femoral artery: Disease proximally though patent and supplying the majority of the blood flow to the right lower extremity.  Right superficial femoral artery: Occluded at its origin with reconstituted atretic flow through its mid aspect (representative axial image 179, series 5)  with eventual occlusion again distally (axial image 234, series 5.  Right popliteal artery: There is reconstitution of the right above knee popliteal artery via deep thigh and geniculate collaterals. There is a mixture of calcified and noncalcified atherosclerotic plaque throughout the right above knee popliteal artery which results in multiple areas of high-grade (> 70%) luminal narrowing. The right below-knee popliteal artery is  widely patent.  Right lower leg: There is eccentric calcified atherosclerotic plaque involving the proximal anterior tibial and tibioperoneal trunks, however there is a preserved three-vessel runoff to the right foot. The right dorsalis pedis artery is patent to the level of the forefoot.   ---------------------------------------------------------------------------------  LEFT LOWER EXTREMITY VASCULATURE:  Left-sided pelvic vasculature: There is eccentric calcified and noncalcified atherosclerotic plaque throughout the left external iliac artery resulting in tandem long segment areas of high-grade (greater 70%) luminal narrowing (representative axial images 91 and 97, series 5, coronal image 92, series 10), though note, intraluminal evaluation is degraded secondary to mural calcification. The left internal iliac artery is heavily diseased though patent and of normal caliber. There is eccentric mixed calcified and noncalcified atherosclerotic plaque throughout the left external iliac artery resulting in tandem long segment areas of high-grade (greater 70%) luminal narrowing).  Left common femoral artery: There is a mixture of calcified and noncalcified atherosclerotic plaque within the left common femoral artery which results an approximately 50% luminal narrowing (image 130, series 5).  Left deep femoral artery: Disease status origin though patent supplying the majority the blood flow to the left leg.  Left superficial femoral artery: There is a very minimal amount of atretic flow  involving the very proximal aspect of the left superficial femoral artery. The vessel as otherwise occluded throughout its course through the level of the adductor canal.  Left popliteal artery: There is reconstitution of flow of the left above knee popliteal artery at via deep thigh an geniculate collaterals. There is eccentric mixed calcified and noncalcified atherosclerotic plaque throughout the left above knee popliteal artery resulting in tandem areas of hemodynamically significant stenosis. The left below-knee popliteal artery is widely patent without hemodynamically significant stenosis.  Left lower leg: There is eccentric atherosclerotic plaque involving the takeoff and proximal aspect of the left anterior tibial artery as well as the distal tibioperoneal trunk, however there is a preserved three-vessel runoff to the left lower leg and foot. The left-sided dorsalis pedis artery is patent to the level of the forefoot.  Review of the MIP images confirms the above findings.   --------------------------------------------------------------------------------  Nonvascular Findings:  Evaluation of the abdominal organs is limited to the arterial phase of enhancement.  Normal hepatic contour. No discrete hyperenhancing hepatic lesions. Punctate calcification within the caudal aspect of the right lobe of the liver (image 57, series 5, is unchanged in favored to the sequela of prior granulomatous infection and/or injury. No ascites.  There is symmetric enhancement of the bilateral kidneys. Note is made of an approximately 1.2 cm partially exophytic hypo attenuating (17 Hounsfield unit) cyst arising from the posterior aspect of the inferior pole the right kidney (axial image 68, series 5). No discrete left-sided renal lesions. No definite renal stones on this postcontrast examination. No urinary obstruction. Minimal amount of grossly symmetric bilateral perinephric stranding. Normal appearance of the bilateral adrenal  glands and spleen. Stable sequela of chronic calcific pancreatitis. No peripancreatic stranding or definitive pancreatic ductal dilatation on this non pancreatic protocol CT.  Scattered minimal colonic diverticulosis without evidence of diverticulitis. Moderate colonic stool burden without evidence of obstruction. No discrete area of bowel wall thickening. Normal appearance of the appendix. No pneumoperitoneum, pneumatosis or portal venous gas.  Prostate is enlarged with mass effect upon the urinary bladder. Normal appearance of the urinary bladder given degree distention. No free fluid within the pelvic cul-de-sac. Incidentally noted bilateral moderate to large sized hydroceles.  Limited visualization of the lower thorax demonstrates small  bilateral effusions with associated bibasilar heterogeneous opacities, likely atelectatic left cyst. There is minimal subsegmental atelectasis within the imaged caudal aspects of the right middle lobe and lingula. There is mild diffuse bronchial wall thickening within the imaged bilateral lower lobe bronchi.  Borderline cardiomegaly, in particular, there is enlargement of the left ventricle. Post median sternotomy with atherosclerotic plaque within the native coronary arteries.  No acute or aggressive osseous abnormalities. Mild degenerate change of the bilateral hips.  There is mild diffuse body wall anasarca.  IMPRESSION: Vascular Impression:  1. Moderate to large amount of mixed calcified and noncalcified slightly irregular atherosclerotic plaque with a normal caliber abdominal aorta, not resulting in a hemodynamically significant stenosis. 2. The major branch vessels of the abdominal aorta (including the IMA) appear patent without a hemodynamically significant stenosis. Right Lower Extremity Vascular Impression:  1. Eccentric mixed calcified and noncalcified atherosclerotic plaque results in tandem areas of high-grade (> 70%) luminal narrowing throughout the proximal aspect  of the right external iliac artery. 2. Eccentric mixed calcified and noncalcified atherosclerotic plaque results in approximately 50% luminal narrowing of the right common femoral artery. 3. The right superficial femoral artery is largely occluded throughout its course with very minimal atretic throw through its mid aspect. 4. The right above knee popliteal artery is reconstituted via deep thigh and geniculate collaterals though is heavily diseased. The right below-knee popliteal artery is widely patent. 5. Eccentric calcified plaque involving the proximal aspects of the anterior tibial artery and tibial peritoneal trunk, however there is a preserved three-vessel runoff to the right lower leg and foot. The right dorsalis pedis artery is patent to the level of the forefoot. Left Lower Extremity Vascular Impression:  1. Eccentric mixed calcified and noncalcified irregular atherosclerotic plaque results in long segment high-grade (greater 70%) luminal narrowing throughout the left common and external iliac arteries. 2. Eccentric mixed calcified and noncalcified atherosclerotic plaque results in approximately 50% luminal narrowing of the left common femoral artery. 3. Very minimal atretic flow involving the very proximal aspect of the left superficial femoral artery. The left superficial femoral artery it is otherwise occluded throughout its course through the level of the adductor canal. 4. The left above knee popliteal artery is reconstituted via deep thigh and geniculate collaterals those heavily diseased. The left below-knee popliteal artery is widely patent. 5. There are several areas of eccentric calcified atherosclerotic plaque involving the anterior tibial and tibioperoneal trunk, however there is a preserved three-vessel runoff to the left lower leg and foot. The left dorsalis pedis artery is patent to the level of the forefoot. Non vascular Impression:  1. Cardiomegaly. Post median sternotomy with  atherosclerotic plaque within the native coronary arteries. 2. Suspected pulmonary edema with small bilateral effusions and mild diffuse body wall anasarca/third-spacing. Clinical correlation is advised. 3. Bronchial wall thickening suggestive of airways disease/bronchitis. 4. Instilling noted moderate to large size bilateral hydroceles. 5. Stable sequela of chronic calcific pancreatitis. No definitive peripancreatic stranding to suggest a superimposed acute process.   Electronically Signed   By: Sandi Mariscal M.D.   On: 07/11/2014 08:21   US Aorta  07/10/2014   CLINICAL DATA:  Coronary artery disease. Peripheral vascular disease. Abdominal aortic aneurysm.  EXAM: ULTRASOUND OF ABDOMINAL AORTA  TECHNIQUE: Ultrasound examination of the abdominal aorta was performed to evaluate for abdominal aortic aneurysm.  COMPARISON:  CT lumbar spine 03/13/2014  FINDINGS: Abdominal Aorta  No aneurysm identified.  Maximum AP  Diameter:  1.9 cm  Maximum TRV  Diameter: 2 cm  Atherosclerotic calcification is noted. Iliac arteries are obscured.  IMPRESSION: No abdominal aortic aneurysm. Maximal AP diameter is 1.9 cm. Normal caliber of the abdominal aorta.   Electronically Signed   By: Lucienne Capers M.D.   On: 07/10/2014 06:06   Dg Chest Port 1 View  07/10/2014   CLINICAL DATA:  Shortness of breath. Diabetes. Hypertension. Coronary artery disease.  EXAM: PORTABLE CHEST - 1 VIEW  COMPARISON:  06/28/2014  FINDINGS: The heart size and mediastinal contours are within normal limits. Both lungs are clear. No evidence of pleural effusion. Prior CABG again noted.  IMPRESSION: No active disease.   Electronically Signed   By: Earle Gell M.D.   On: 07/10/2014 00:26   Dg Chest Portable 1 View  06/28/2014   CLINICAL DATA:  Three day history of chest pain. Chronic bilateral lower extremity edema. Current history of diabetes, hypertension and COPD. Current smoker. Prior CABG.  EXAM: PORTABLE CHEST - 1 VIEW  COMPARISON:  Two-view chest x-ray  10/30/2013, 10/27/2013, 02/06/2011.  FINDINGS: Prior sternotomy for CABG. Cardiac silhouette upper normal in size. Mild diffuse interstitial pulmonary edema superimposed upon baseline changes of COPD/emphysema. No confluent airspace consolidation. No visible pleural effusions.  IMPRESSION: Mild CHF and/or fluid overload, with mild diffuse interstitial pulmonary edema. COPD/emphysema.   Electronically Signed   By: Evangeline Dakin M.D.   On: 06/28/2014 17:14     PERTINENT LAB RESULTS: CBC:  Recent Labs  07/13/14 1205  WBC 8.6  HGB 11.2*  HCT 35.7*  PLT 298   CMET CMP     Component Value Date/Time   NA 138 07/13/2014 1205   K 4.1 07/13/2014 1205   CL 94* 07/13/2014 1205   CO2 29 07/13/2014 1205   GLUCOSE 230* 07/13/2014 1205   BUN 31* 07/13/2014 1205   CREATININE 1.04 07/13/2014 1205   CALCIUM 9.1 07/13/2014 1205   PROT 6.7 07/09/2014 2113   ALBUMIN 2.7* 07/09/2014 2113   AST 20 07/09/2014 2113   ALT 10 07/09/2014 2113   ALKPHOS 44 07/09/2014 2113   BILITOT <0.2* 07/09/2014 2113   GFRNONAA 70* 07/13/2014 1205   GFRAA 81* 07/13/2014 1205    GFR Estimated Creatinine Clearance: 69.4 ml/min (by C-G formula based on Cr of 1.04). No results found for this basename: LIPASE, AMYLASE,  in the last 72 hours No results found for this basename: CKTOTAL, CKMB, CKMBINDEX, TROPONINI,  in the last 72 hours No components found with this basename: POCBNP,  No results found for this basename: DDIMER,  in the last 72 hours No results found for this basename: HGBA1C,  in the last 72 hours No results found for this basename: CHOL, HDL, LDLCALC, TRIG, CHOLHDL, LDLDIRECT,  in the last 72 hours No results found for this basename: TSH, T4TOTAL, FREET3, T3FREE, THYROIDAB,  in the last 72 hours No results found for this basename: VITAMINB12, FOLATE, FERRITIN, TIBC, IRON, RETICCTPCT,  in the last 72 hours Coags: No results found for this basename: PT, INR,  in the last 72 hours Microbiology: Recent Results (from the past 240  hour(s))  MRSA PCR SCREENING     Status: None   Collection Time    07/10/14  8:22 AM      Result Value Ref Range Status   MRSA by PCR NEGATIVE  NEGATIVE Final   Comment:            The GeneXpert MRSA Assay (FDA     approved for NASAL specimens     only), is one  component of a     comprehensive MRSA colonization     surveillance program. It is not     intended to diagnose MRSA     infection nor to guide or     monitor treatment for     MRSA infections.     BRIEF HOSPITAL COURSE:   Principal Problem: Suspected ischemic Ulcers of left lower extremity  - Likely secondary to underlying peripheral vascular disease.  - Seen by wound care- recommendations are for conservative wound care keep dry eschar, keep free from infectionand protected from further injury until and if vascular intervention is performed. Will arrange for home health RN on  Peripheral vascular disease  - Workup including a CT angiogram-demonstrates severe iliofemoral occlusive disease-seen by vascular surgery this admission, since pulmonary status is still not optimal-outpatient workup recommended. Arteriogram scheduled on 08/05/14 by vascular surgery. In the meantime, will manage with aspirin, Plavix, antilipid medications.  Acute hypoxic respiratory failure  - Likely secondary to COPD exacerbation, mild acute on chronic systolic heart failure. - Ambulated with physical therapy yesterday (9/9)-per physical therapy notes-O2 saturation more than 90% on room air. - Today, no longer requiring oxygen at rest, significant improvement on admission  COPD exacerbation - Admitted, started on steroids, nebulized bronchodilators and doxycycline-currently before. Significantly improved, however still wheezing-but no longer requiring oxygen on ambulation. Patient is requesting discharge today. He claims that "he always wheezes", claims that he is back to his baseline and does not feel short of breath on walking. Since significantly  improved, he will be discharged on tapering prednisone, as needed nebulized bronchodilators, he is to continue taking Symbicort, would add Spiriva. He has been counseled extensively regarding the importance of completely stopping smoking. An outpatient appointment with pulmonology has been made for 9/24, patient has been encouraged to keep this appointment.  Acute on chronic systolic heart failure EF 40% - Significantly compensated at the time of discharge. -continue with Lasix, lisinopril, metoprolol and Aldactone - Cardiology was consulted during this hospital stay  Elevated troponin  - Likely demand ischemia.  - However has severe CAD s/p CABG- but recent cardiac catheterization on December 2014 showed patent grafts.  - Cardiology recommending medical management at this time-continue with aspirin, Plavix, antilipid agents, beta blockers  Diabetes mellitus II with vascular complications: - Patient did have a hypoglycemic episode-as a result Lantus was decreased to 20 units, he is to continue this on discharge. We'll resume oral hypoglycemic agents at discharge.  Hypertension  - BP fluctuating-but moderately well controlled with metoprolol, lisinopril and diuretic regimen. Will increase lisinopril to 40 mg on the, continue to follow BP trend as outpatient- would defer further optimization of his blood pressure regimen to the outpatient setting.  History of chronic pancreatitis  - Stable, no abdominal pain-no vomiting. Continue Creon.   History of dyslipidemia  - Continue with statins and fenofibrate   Tobacco abuse  - Counseled regarding importance of quitting. Seems is motivated, however refuses nicotine at this time    TODAY-DAY OF DISCHARGE:  Subjective:   Stephen [name] today has no headache,no chest abdominal pain,no new weakness tingling or numbness, feels much better wants to go home today.   Objective:   Blood pressure 173/65, pulse 72, temperature 97.8 F (36.6 C),  temperature source Oral, resp. rate 20, height 5\' 11"  (1.803 m), weight 89.7 kg (197 lb 12 oz), SpO2 96.00%.  Intake/Output Summary (Last 24 hours) at 07/14/14 1152 Last data filed at 07/14/14 0953  Gross per 24 hour  Intake    684 ml  Output   3600 ml  Net  -2916 ml   Filed Weights   07/10/14 0807  Weight: 89.7 kg (197 lb 12 oz)    Exam Awake Alert, Oriented *3, No new F.N deficits, Normal affect [place].AT,PERRAL Supple Neck,No JVD, No cervical lymphadenopathy appriciated.  Symmetrical Chest wall movement, Good air movement bilaterally, scattered rhonchi RRR,No Gallops,Rubs or new Murmurs, No Parasternal Heave +ve B.Sounds, Abd Soft, Non tender, No organomegaly appriciated, No rebound -guarding or rigidity. No Cyanosis, Clubbing or edema, No new Rash or bruise  DISCHARGE CONDITION: Stable  DISPOSITION: Home with home health services  DISCHARGE INSTRUCTIONS:    Activity:  As tolerated   Diet recommendation: Diabetic Diet Heart Healthy diet   Discharge Instructions   (HEART FAILURE PATIENTS) Call MD:  Anytime you have any of the following symptoms: 1) 3 pound weight gain in 24 hours or 5 pounds in 1 week 2) shortness of breath, with or without a dry hacking cough 3) swelling in the hands, feet or stomach 4) if you have to sleep on extra pillows at night in order to breathe.    Complete by:  As directed      Call MD for:  difficulty breathing, headache or visual disturbances    Complete by:  As directed      Diet - low sodium heart healthy    Complete by:  As directed      Diet general    Complete by:  As directed      Increase activity slowly    Complete by:  As directed            Follow-up Information   Follow up with Arnoldo Lenis, MD. (Please make an appointment with Dr. Harl Bowie )    Specialty:  Cardiology   Contact information:   [place] [address] [phone]       Follow up with Lynetta Mare L, NT. Schedule an  appointment as soon as possible for a visit in 1 week.   Specialty:  Cardiology   Contact information:   [place] [address]       Follow up with Elam Dutch, MD. Schedule an appointment as soon as possible for a visit in 1 week.   Specialty:  Vascular Surgery   Contact information:   [address] [place] [address] [phone]       Follow up with Christinia Gully, MD On 07/28/2014. (appt 10:30 am)    Specialty:  Pulmonary Disease   Contact information:   [address] [address] [phone]       Total Time spent on discharge equals 45 minutes.  SignedOren Binet 07/14/2014 11:52 AM  **Disclaimer: This note may have been dictated with voice recognition software. Similar sounding words can inadvertently be transcribed and this note may contain transcription errors which may not have been corrected upon publication of note.**

## 2014-07-14 NOTE — Progress Notes (Signed)
Patient discharge teaching given, including activity, diet, follow-up appoints, and medications. Patient verbalized understanding of all discharge instructions. IV access was d/c'd. Vitals are stable. Skin is intact except as charted in most recent assessments. Pt to be escorted out by NT, to be driven home by family. 

## 2014-07-14 NOTE — Progress Notes (Addendum)
Subjective: He is ready to go home. No CP or SOB.   Objective: Vital signs in last 24 hours: Temp:  [97.8 F (36.6 C)-98.6 F (37 C)] 97.8 F (36.6 C) (09/10 0550) Pulse Rate:  [72-82] 72 (09/10 0550) Resp:  [20-22] 20 (09/10 0550) BP: (173-196)/(65-77) 173/65 mmHg (09/10 0611) SpO2:  [96 %-97 %] 96 % (09/10 0550) Last BM Date: 07/11/14  Intake/Output from previous day: 09/09 0701 - 09/10 0700 In: 684 [P.O.:684] Out: 4200 [Urine:4200] Intake/Output this shift:    Medications Current Facility-Administered Medications  Medication Dose Route Frequency Provider Last Rate Last Dose  . aspirin EC tablet 325 mg  325 mg Oral q morning - 10a Etta Quill, DO   325 mg at 07/13/14 1002  . atorvastatin (LIPITOR) tablet 40 mg  40 mg Oral Daily Etta Quill, DO   40 mg at 07/13/14 1002  . budesonide-formoterol (SYMBICORT) 160-4.5 MCG/ACT inhaler 2 puff  2 puff Inhalation BID Etta Quill, DO   2 puff at 07/13/14 0904  . clopidogrel (PLAVIX) tablet 75 mg  75 mg Oral Daily Etta Quill, DO   75 mg at 07/13/14 1002  . doxycycline (VIBRA-TABS) tablet 100 mg  100 mg Oral Q12H Charlynne Cousins, MD   100 mg at 07/13/14 2152  . fenofibrate tablet 160 mg  160 mg Oral Q breakfast Etta Quill, DO   160 mg at 07/13/14 0805  . furosemide (LASIX) tablet 40 mg  40 mg Oral Daily Charlynne Cousins, MD   40 mg at 07/13/14 1002  . gabapentin (NEURONTIN) capsule 300 mg  300 mg Oral QHS Etta Quill, DO   300 mg at 07/13/14 2142  . glipiZIDE (GLUCOTROL XL) 24 hr tablet 2.5 mg  2.5 mg Oral BID Charlynne Cousins, MD   2.5 mg at 07/13/14 2143  . guaiFENesin (MUCINEX) 12 hr tablet 600 mg  600 mg Oral BID Jonetta Osgood, MD      . heparin injection 5,000 Units  5,000 Units Subcutaneous 3 times per day Etta Quill, DO   5,000 Units at 07/13/14 0960  . hydrALAZINE (APRESOLINE) injection 10 mg  10 mg Intravenous Q6H PRN Jonetta Osgood, MD      . HYDROcodone-acetaminophen  (NORCO/VICODIN) 5-325 MG per tablet 1 tablet  1 tablet Oral Q4H PRN Jeryl Columbia, NP   1 tablet at 07/12/14 2326  . insulin aspart (novoLOG) injection 0-9 Units  0-9 Units Subcutaneous TID WC Charlynne Cousins, MD   1 Units at 07/13/14 1717  . insulin glargine (LANTUS) injection 20 Units  20 Units Subcutaneous BID Jonetta Osgood, MD   20 Units at 07/13/14 2143  . levalbuterol (XOPENEX) nebulizer solution 0.63 mg  0.63 mg Nebulization Q3H PRN Etta Quill, DO   0.63 mg at 07/11/14 0433  . lipase/protease/amylase (CREON) capsule 72,000 Units  72,000 Units Oral TID WC & HS Charlynne Cousins, MD   72,000 Units at 07/13/14 2143  . lisinopril (PRINIVIL,ZESTRIL) tablet 40 mg  40 mg Oral Daily Shanker Kristeen Mans, MD      . loratadine (CLARITIN) tablet 10 mg  10 mg Oral Daily Etta Quill, DO   10 mg at 07/13/14 1002  . methylPREDNISolone sodium succinate (SOLU-MEDROL) 40 mg/mL injection 40 mg  40 mg Intravenous Q12H Jonetta Osgood, MD   40 mg at 07/13/14 2104  . metoprolol tartrate (LOPRESSOR) tablet 25 mg  25 mg Oral BID  Etta Quill, DO   25 mg at 07/13/14 2144  . nitroGLYCERIN (NITROSTAT) SL tablet 0.4 mg  0.4 mg Sublingual Q5 Min x 3 PRN Etta Quill, DO      . pantoprazole (PROTONIX) EC tablet 40 mg  40 mg Oral Daily Etta Quill, DO   40 mg at 07/13/14 1002  . spironolactone (ALDACTONE) tablet 25 mg  25 mg Oral Daily Charlynne Cousins, MD   25 mg at 07/13/14 1002    PE: General appearance: alert, cooperative and no distress Lungs: bilateral diffuse rhonchi and expiratory wheezing Heart: regular rate and rhythm Extremities: trace-1+ bilateral LEE Pulses: 1+ and symmetric Skin: warm and dry Neurologic: Grossly normal  Lab Results:   Recent Labs  07/13/14 1205  WBC 8.6  HGB 11.2*  HCT 35.7*  PLT 298   BMET  Recent Labs  07/13/14 1205  NA 138  K 4.1  CL 94*  CO2 29  GLUCOSE 230*  BUN 31*  CREATININE 1.04  CALCIUM 9.1     Assessment/Plan  Principal Problem:   Ulcer of left lower extremity Active Problems:   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 is a 73 yo man with PMH of T2DM, PVD, dyslipidemia, CAD with CABG in 6/11 with recent LHC in 10/2013 demonstrating patent grafts with moderate LVD (EF 40%), COPD with continued tobacco use (multiple packs per day), who presented with left leg pain (venous duplex negative for DVT). PVD suspected. Vascular surgery following. Cardiology actually consulted for initial troponin of 0.4 (peaked at 2.07) and ECG with chonric LBBB.   Elevated troponin due to demand ischemia/CAD: He has severe CAD s/p CABG with 3 patents bypass grafts by cath December 2014. Agree that his troponin is likely due to demand ischemia in the setting of acute illness. (peak of 2.0 and trending down at last check 2.07-->1.92). He has no complaints of chest pain at this time. EKG with chronic LBBB. Agree with conservative management of CAD at this time. We will follow with you.  -- Continue medical therapy with ASA, Plavix, BB, ACE and statin.    COPD exacerbation: Per primary team  PAD: Non-healing wounds on legs with severe PAD. Vascular surgery following.  -- Plans for outpatient LE angiogram per Dr. [name].   Tobacco abuse: ongoing, cessation advised.   Chronic systolic CHF -- CXR clear. Neck veins not elevated. However, BNP 1466 and LE swelling. He also does have orthopnea.  -- Net neg 8.8 L -- Continue Lasix 40 mg po qd. Creat  stable.   Dispo- probably nearing discharge. Cardiology can likely sign off. No new recommendations. Please call us with any questions you may have. He used to follow with Dr. Domenic Polite, doesn't want to follow with him anymore. He can follow with Dr. Harl Bowie in [place]. Patient not very compliant and "doesnt like going to the heart doctor."    LOS: 5 days   Eileen Stanford 07/14/2014 8:19 AM  I have personally seen and examined this  patient with Angelena Form, PA-C. I agree with the assessment and plan as outlined above. Pt has diuresed well. Medical management of CAD. No further recs. OK to d/c home from cardiac perspective. He can f/u with Dr. Harl Bowie in our [facility] office.   MCALHANY,CHRISTOPHER 07/14/2014 10:06 AM

## 2014-07-15 LAB — GLUCOSE, CAPILLARY
GLUCOSE-CAPILLARY: 125 mg/dL — AB (ref 70–99)
Glucose-Capillary: 34 mg/dL — CL (ref 70–99)
Glucose-Capillary: 60 mg/dL — ABNORMAL LOW (ref 70–99)
Glucose-Capillary: 86 mg/dL (ref 70–99)

## 2014-07-21 ENCOUNTER — Inpatient Hospital Stay (HOSPITAL_COMMUNITY)
Admission: EM | Admit: 2014-07-21 | Discharge: 2014-07-23 | DRG: 638 | Disposition: A | Discharge status: Home Health | Payer: Medicare Other | Attending: Family Medicine | Admitting: Family Medicine

## 2014-07-21 DIAGNOSIS — Z7982 Long term (current) use of aspirin: Secondary | ICD-10-CM

## 2014-07-21 DIAGNOSIS — IMO0002 Reserved for concepts with insufficient information to code with codable children: Principal | ICD-10-CM | POA: Diagnosis present

## 2014-07-21 DIAGNOSIS — I714 Abdominal aortic aneurysm, without rupture, unspecified: Secondary | ICD-10-CM

## 2014-07-21 DIAGNOSIS — J441 Chronic obstructive pulmonary disease with (acute) exacerbation: Secondary | ICD-10-CM

## 2014-07-21 DIAGNOSIS — E138 Other specified diabetes mellitus with unspecified complications: Secondary | ICD-10-CM

## 2014-07-21 DIAGNOSIS — Z794 Long term (current) use of insulin: Secondary | ICD-10-CM

## 2014-07-21 DIAGNOSIS — E162 Hypoglycemia, unspecified: Secondary | ICD-10-CM | POA: Diagnosis present

## 2014-07-21 DIAGNOSIS — I10 Essential (primary) hypertension: Secondary | ICD-10-CM | POA: Diagnosis present

## 2014-07-21 DIAGNOSIS — Z951 Presence of aortocoronary bypass graft: Secondary | ICD-10-CM

## 2014-07-21 DIAGNOSIS — R680 Hypothermia, not associated with low environmental temperature: Secondary | ICD-10-CM | POA: Diagnosis present

## 2014-07-21 DIAGNOSIS — Z9889 Other specified postprocedural states: Secondary | ICD-10-CM

## 2014-07-21 DIAGNOSIS — E875 Hyperkalemia: Secondary | ICD-10-CM

## 2014-07-21 DIAGNOSIS — E785 Hyperlipidemia, unspecified: Secondary | ICD-10-CM

## 2014-07-21 DIAGNOSIS — E1169 Type 2 diabetes mellitus with other specified complication: Secondary | ICD-10-CM | POA: Diagnosis not present

## 2014-07-21 DIAGNOSIS — I739 Peripheral vascular disease, unspecified: Secondary | ICD-10-CM | POA: Diagnosis present

## 2014-07-21 DIAGNOSIS — E1059 Type 1 diabetes mellitus with other circulatory complications: Secondary | ICD-10-CM | POA: Diagnosis present

## 2014-07-21 DIAGNOSIS — I2510 Atherosclerotic heart disease of native coronary artery without angina pectoris: Secondary | ICD-10-CM | POA: Diagnosis present

## 2014-07-21 DIAGNOSIS — I259 Chronic ischemic heart disease, unspecified: Secondary | ICD-10-CM | POA: Diagnosis present

## 2014-07-21 DIAGNOSIS — E1165 Type 2 diabetes mellitus with hyperglycemia: Principal | ICD-10-CM | POA: Diagnosis present

## 2014-07-21 DIAGNOSIS — R7989 Other specified abnormal findings of blood chemistry: Secondary | ICD-10-CM

## 2014-07-21 DIAGNOSIS — R6881 Early satiety: Secondary | ICD-10-CM

## 2014-07-21 DIAGNOSIS — M129 Arthropathy, unspecified: Secondary | ICD-10-CM | POA: Diagnosis present

## 2014-07-21 DIAGNOSIS — L97929 Non-pressure chronic ulcer of unspecified part of left lower leg with unspecified severity: Secondary | ICD-10-CM | POA: Diagnosis present

## 2014-07-21 DIAGNOSIS — Z79899 Other long term (current) drug therapy: Secondary | ICD-10-CM

## 2014-07-21 DIAGNOSIS — F17200 Nicotine dependence, unspecified, uncomplicated: Secondary | ICD-10-CM | POA: Diagnosis present

## 2014-07-21 DIAGNOSIS — T68XXXA Hypothermia, initial encounter: Secondary | ICD-10-CM | POA: Diagnosis present

## 2014-07-21 DIAGNOSIS — I5023 Acute on chronic systolic (congestive) heart failure: Secondary | ICD-10-CM

## 2014-07-21 DIAGNOSIS — N62 Hypertrophy of breast: Secondary | ICD-10-CM

## 2014-07-21 DIAGNOSIS — I718 Aortic aneurysm of unspecified site, ruptured: Secondary | ICD-10-CM

## 2014-07-21 DIAGNOSIS — I509 Heart failure, unspecified: Secondary | ICD-10-CM | POA: Diagnosis present

## 2014-07-21 DIAGNOSIS — I798 Other disorders of arteries, arterioles and capillaries in diseases classified elsewhere: Secondary | ICD-10-CM | POA: Diagnosis present

## 2014-07-21 DIAGNOSIS — I252 Old myocardial infarction: Secondary | ICD-10-CM

## 2014-07-21 DIAGNOSIS — I214 Non-ST elevation (NSTEMI) myocardial infarction: Secondary | ICD-10-CM

## 2014-07-21 DIAGNOSIS — I248 Other forms of acute ischemic heart disease: Secondary | ICD-10-CM

## 2014-07-21 DIAGNOSIS — I7090 Unspecified atherosclerosis: Secondary | ICD-10-CM

## 2014-07-21 DIAGNOSIS — F1011 Alcohol abuse, in remission: Secondary | ICD-10-CM

## 2014-07-21 DIAGNOSIS — I5042 Chronic combined systolic (congestive) and diastolic (congestive) heart failure: Secondary | ICD-10-CM | POA: Diagnosis present

## 2014-07-21 DIAGNOSIS — L97909 Non-pressure chronic ulcer of unspecified part of unspecified lower leg with unspecified severity: Secondary | ICD-10-CM | POA: Diagnosis present

## 2014-07-21 DIAGNOSIS — E1159 Type 2 diabetes mellitus with other circulatory complications: Secondary | ICD-10-CM | POA: Diagnosis present

## 2014-07-21 DIAGNOSIS — K219 Gastro-esophageal reflux disease without esophagitis: Secondary | ICD-10-CM | POA: Diagnosis present

## 2014-07-21 DIAGNOSIS — I2589 Other forms of chronic ischemic heart disease: Secondary | ICD-10-CM | POA: Diagnosis present

## 2014-07-21 DIAGNOSIS — L98499 Non-pressure chronic ulcer of skin of other sites with unspecified severity: Secondary | ICD-10-CM | POA: Diagnosis present

## 2014-07-21 DIAGNOSIS — Z9849 Cataract extraction status, unspecified eye: Secondary | ICD-10-CM

## 2014-07-21 DIAGNOSIS — I70219 Atherosclerosis of native arteries of extremities with intermittent claudication, unspecified extremity: Secondary | ICD-10-CM

## 2014-07-21 DIAGNOSIS — R778 Other specified abnormalities of plasma proteins: Secondary | ICD-10-CM

## 2014-07-21 DIAGNOSIS — I5022 Chronic systolic (congestive) heart failure: Secondary | ICD-10-CM | POA: Diagnosis present

## 2014-07-21 DIAGNOSIS — Z9861 Coronary angioplasty status: Secondary | ICD-10-CM

## 2014-07-21 DIAGNOSIS — J449 Chronic obstructive pulmonary disease, unspecified: Secondary | ICD-10-CM | POA: Diagnosis present

## 2014-07-21 DIAGNOSIS — J189 Pneumonia, unspecified organism: Secondary | ICD-10-CM

## 2014-07-21 DIAGNOSIS — J9601 Acute respiratory failure with hypoxia: Secondary | ICD-10-CM

## 2014-07-21 DIAGNOSIS — J4489 Other specified chronic obstructive pulmonary disease: Secondary | ICD-10-CM | POA: Diagnosis present

## 2014-07-21 LAB — CBG MONITORING, ED: Glucose-Capillary: 120 mg/dL — ABNORMAL HIGH (ref 70–99)

## 2014-07-21 NOTE — ED Notes (Addendum)
Pt brought to ED by EMS from home for low blood sugar, pt CBG on EMS arrival was 26, pt AO x4 no LOC, pt very weak and diaphoretic. 1/2 on D50 given by EMS CBG up to 50 another 1/2 given when up to 120, no nausea or vomiting, no distress noticed. BP 213/97 initially last BP 196/80, HR 86, R,19 99% O2 on 4L. EKG by EMS having some ST depression. Pt recently dc from ICU last week. Pt having multiple wound on his leg, possible from flea bites or bedbugs.

## 2014-07-22 ENCOUNTER — Emergency Department (HOSPITAL_COMMUNITY): Payer: Medicare Other

## 2014-07-22 ENCOUNTER — Encounter (HOSPITAL_COMMUNITY): Payer: Self-pay | Admitting: Emergency Medicine

## 2014-07-22 DIAGNOSIS — E785 Hyperlipidemia, unspecified: Secondary | ICD-10-CM | POA: Diagnosis present

## 2014-07-22 DIAGNOSIS — Z794 Long term (current) use of insulin: Secondary | ICD-10-CM | POA: Diagnosis not present

## 2014-07-22 DIAGNOSIS — IMO0002 Reserved for concepts with insufficient information to code with codable children: Secondary | ICD-10-CM | POA: Diagnosis present

## 2014-07-22 DIAGNOSIS — Z9849 Cataract extraction status, unspecified eye: Secondary | ICD-10-CM | POA: Diagnosis not present

## 2014-07-22 DIAGNOSIS — J449 Chronic obstructive pulmonary disease, unspecified: Secondary | ICD-10-CM | POA: Diagnosis present

## 2014-07-22 DIAGNOSIS — Z9861 Coronary angioplasty status: Secondary | ICD-10-CM | POA: Diagnosis not present

## 2014-07-22 DIAGNOSIS — I252 Old myocardial infarction: Secondary | ICD-10-CM | POA: Diagnosis not present

## 2014-07-22 DIAGNOSIS — I509 Heart failure, unspecified: Secondary | ICD-10-CM | POA: Diagnosis present

## 2014-07-22 DIAGNOSIS — K219 Gastro-esophageal reflux disease without esophagitis: Secondary | ICD-10-CM | POA: Diagnosis present

## 2014-07-22 DIAGNOSIS — I5022 Chronic systolic (congestive) heart failure: Secondary | ICD-10-CM | POA: Diagnosis present

## 2014-07-22 DIAGNOSIS — E1165 Type 2 diabetes mellitus with hyperglycemia: Secondary | ICD-10-CM | POA: Diagnosis present

## 2014-07-22 DIAGNOSIS — T68XXXA Hypothermia, initial encounter: Secondary | ICD-10-CM | POA: Diagnosis present

## 2014-07-22 DIAGNOSIS — F17200 Nicotine dependence, unspecified, uncomplicated: Secondary | ICD-10-CM | POA: Diagnosis present

## 2014-07-22 DIAGNOSIS — I2589 Other forms of chronic ischemic heart disease: Secondary | ICD-10-CM | POA: Diagnosis present

## 2014-07-22 DIAGNOSIS — I10 Essential (primary) hypertension: Secondary | ICD-10-CM

## 2014-07-22 DIAGNOSIS — E162 Hypoglycemia, unspecified: Secondary | ICD-10-CM | POA: Diagnosis present

## 2014-07-22 DIAGNOSIS — I739 Peripheral vascular disease, unspecified: Secondary | ICD-10-CM | POA: Diagnosis present

## 2014-07-22 DIAGNOSIS — Z7982 Long term (current) use of aspirin: Secondary | ICD-10-CM | POA: Diagnosis not present

## 2014-07-22 DIAGNOSIS — M129 Arthropathy, unspecified: Secondary | ICD-10-CM | POA: Diagnosis present

## 2014-07-22 DIAGNOSIS — E1159 Type 2 diabetes mellitus with other circulatory complications: Secondary | ICD-10-CM | POA: Diagnosis present

## 2014-07-22 DIAGNOSIS — Z951 Presence of aortocoronary bypass graft: Secondary | ICD-10-CM | POA: Diagnosis not present

## 2014-07-22 DIAGNOSIS — L97909 Non-pressure chronic ulcer of unspecified part of unspecified lower leg with unspecified severity: Secondary | ICD-10-CM | POA: Diagnosis present

## 2014-07-22 DIAGNOSIS — I798 Other disorders of arteries, arterioles and capillaries in diseases classified elsewhere: Secondary | ICD-10-CM | POA: Diagnosis present

## 2014-07-22 DIAGNOSIS — R680 Hypothermia, not associated with low environmental temperature: Secondary | ICD-10-CM | POA: Diagnosis present

## 2014-07-22 DIAGNOSIS — E1169 Type 2 diabetes mellitus with other specified complication: Secondary | ICD-10-CM | POA: Diagnosis present

## 2014-07-22 DIAGNOSIS — Z79899 Other long term (current) drug therapy: Secondary | ICD-10-CM | POA: Diagnosis not present

## 2014-07-22 DIAGNOSIS — I2510 Atherosclerotic heart disease of native coronary artery without angina pectoris: Secondary | ICD-10-CM | POA: Diagnosis present

## 2014-07-22 LAB — BASIC METABOLIC PANEL
ANION GAP: 9 (ref 5–15)
BUN: 10 mg/dL (ref 6–23)
CALCIUM: 9.2 mg/dL (ref 8.4–10.5)
CO2: 31 meq/L (ref 19–32)
Chloride: 100 mEq/L (ref 96–112)
Creatinine, Ser: 0.87 mg/dL (ref 0.50–1.35)
GFR calc Af Amer: >90 mL/min (ref >90)
GFR, EST NON AFRICAN AMERICAN: 85 mL/min — AB (ref >90)
Glucose, Bld: 136 mg/dL — ABNORMAL HIGH (ref 70–99)
POTASSIUM: 4.4 meq/L (ref 3.7–5.3)
SODIUM: 140 meq/L (ref 137–147)

## 2014-07-22 LAB — URINALYSIS, ROUTINE W REFLEX MICROSCOPIC
Bilirubin Urine: NEGATIVE
Glucose, UA: NEGATIVE mg/dL
Ketones, ur: NEGATIVE mg/dL
Leukocytes, UA: NEGATIVE
Nitrite: NEGATIVE
Protein, ur: >300 mg/dL — AB
SPECIFIC GRAVITY, URINE: 1.008 (ref 1.005–1.030)
UROBILINOGEN UA: 0.2 mg/dL (ref 0.0–1.0)
pH: 6 (ref 5.0–8.0)

## 2014-07-22 LAB — CBC WITH DIFFERENTIAL/PLATELET
BASOS PCT: 0 % (ref 0–1)
Basophils Absolute: 0 10*3/uL (ref 0.0–0.1)
EOS PCT: 0 % (ref 0–5)
Eosinophils Absolute: 0 10*3/uL (ref 0.0–0.7)
HCT: 35.2 % — ABNORMAL LOW (ref 39.0–52.0)
Hemoglobin: 11.4 g/dL — ABNORMAL LOW (ref 13.0–17.0)
Lymphocytes Relative: 4 % — ABNORMAL LOW (ref 12–46)
Lymphs Abs: 0.7 10*3/uL (ref 0.7–4.0)
MCH: 30.4 pg (ref 26.0–34.0)
MCHC: 32.4 g/dL (ref 30.0–36.0)
MCV: 93.9 fL (ref 78.0–100.0)
Monocytes Absolute: 1.2 10*3/uL — ABNORMAL HIGH (ref 0.1–1.0)
Monocytes Relative: 7 % (ref 3–12)
NEUTROS PCT: 89 % — AB (ref 43–77)
Neutro Abs: 14.6 10*3/uL — ABNORMAL HIGH (ref 1.7–7.7)
PLATELETS: 311 10*3/uL (ref 150–400)
RBC: 3.75 MIL/uL — ABNORMAL LOW (ref 4.22–5.81)
RDW: 15.3 % (ref 11.5–15.5)
WBC: 16.5 10*3/uL — ABNORMAL HIGH (ref 4.0–10.5)

## 2014-07-22 LAB — CBC
HCT: 34.2 % — ABNORMAL LOW (ref 39.0–52.0)
Hemoglobin: 11.2 g/dL — ABNORMAL LOW (ref 13.0–17.0)
MCH: 30.4 pg (ref 26.0–34.0)
MCHC: 32.7 g/dL (ref 30.0–36.0)
MCV: 92.9 fL (ref 78.0–100.0)
PLATELETS: 323 10*3/uL (ref 150–400)
RBC: 3.68 MIL/uL — ABNORMAL LOW (ref 4.22–5.81)
RDW: 15.4 % (ref 11.5–15.5)
WBC: 11.4 10*3/uL — AB (ref 4.0–10.5)

## 2014-07-22 LAB — COMPREHENSIVE METABOLIC PANEL
ALBUMIN: 2.5 g/dL — AB (ref 3.5–5.2)
ALK PHOS: 51 U/L (ref 39–117)
ALT: 16 U/L (ref 0–53)
ANION GAP: 12 (ref 5–15)
AST: 24 U/L (ref 0–37)
BUN: 12 mg/dL (ref 6–23)
CALCIUM: 9.2 mg/dL (ref 8.4–10.5)
CO2: 28 mEq/L (ref 19–32)
Chloride: 102 mEq/L (ref 96–112)
Creatinine, Ser: 0.84 mg/dL (ref 0.50–1.35)
GFR calc Af Amer: >90 mL/min (ref >90)
GFR calc non Af Amer: 86 mL/min — ABNORMAL LOW (ref >90)
Glucose, Bld: 85 mg/dL (ref 70–99)
POTASSIUM: 4 meq/L (ref 3.7–5.3)
Sodium: 142 mEq/L (ref 137–147)
Total Bilirubin: <0.2 mg/dL — ABNORMAL LOW (ref 0.3–1.2)
Total Protein: 6.5 g/dL (ref 6.0–8.3)

## 2014-07-22 LAB — GLUCOSE, CAPILLARY
GLUCOSE-CAPILLARY: 99 mg/dL (ref 70–99)
GLUCOSE-CAPILLARY: 251 mg/dL — AB (ref 70–99)
GLUCOSE-CAPILLARY: 197 mg/dL — AB (ref 70–99)
Glucose-Capillary: 150 mg/dL — ABNORMAL HIGH (ref 70–99)
Glucose-Capillary: 48 mg/dL — ABNORMAL LOW (ref 70–99)
Glucose-Capillary: 285 mg/dL — ABNORMAL HIGH (ref 70–99)

## 2014-07-22 LAB — I-STAT CG4 LACTIC ACID, ED: Lactic Acid, Venous: 0.94 mmol/L (ref 0.5–2.2)

## 2014-07-22 LAB — I-STAT TROPONIN, ED: TROPONIN I, POC: 0.04 ng/mL (ref 0.00–0.08)

## 2014-07-22 LAB — URINE MICROSCOPIC-ADD ON

## 2014-07-22 LAB — PRO B NATRIURETIC PEPTIDE: PRO B NATRI PEPTIDE: 3366 pg/mL — AB (ref 0–125)

## 2014-07-22 LAB — LIPASE, BLOOD: Lipase: 7 U/L — ABNORMAL LOW (ref 11–59)

## 2014-07-22 MED ORDER — DOXYCYCLINE HYCLATE 100 MG PO TABS
100.0000 mg | ORAL_TABLET | Freq: Two times a day (BID) | ORAL | Status: DC
  Administered 2014-07-22 – 2014-07-23 (×3): 100 mg via ORAL
  Filled 2014-07-22 (×4): qty 1

## 2014-07-22 MED ORDER — INFLUENZA VAC SPLIT QUAD 0.5 ML IM SUSY
0.5000 mL | PREFILLED_SYRINGE | INTRAMUSCULAR | Status: DC | PRN
Start: 2014-07-22 — End: 2014-07-23
  Filled 2014-07-22: qty 0.5

## 2014-07-22 MED ORDER — FENOFIBRATE 160 MG PO TABS
160.0000 mg | ORAL_TABLET | Freq: Every day | ORAL | Status: DC
  Administered 2014-07-22 – 2014-07-23 (×2): 160 mg via ORAL
  Filled 2014-07-22 (×3): qty 1

## 2014-07-22 MED ORDER — LORATADINE 10 MG PO TABS
10.0000 mg | ORAL_TABLET | Freq: Every day | ORAL | Status: DC
  Administered 2014-07-22 – 2014-07-23 (×2): 10 mg via ORAL
  Filled 2014-07-22 (×2): qty 1

## 2014-07-22 MED ORDER — SODIUM CHLORIDE 0.9 % IV BOLUS (SEPSIS): 1000.0000 mL | Freq: Once | INTRAVENOUS | Status: DC

## 2014-07-22 MED ORDER — IPRATROPIUM-ALBUTEROL 0.5-2.5 (3) MG/3ML IN SOLN
3.0000 mL | Freq: Four times a day (QID) | RESPIRATORY_TRACT | Status: DC | PRN
  Administered 2014-07-22 – 2014-07-23 (×2): 3 mL via RESPIRATORY_TRACT
  Filled 2014-07-22 (×2): qty 3

## 2014-07-22 MED ORDER — PNEUMOCOCCAL VAC POLYVALENT 25 MCG/0.5ML IJ INJ
0.5000 mL | INJECTION | INTRAMUSCULAR | Status: DC | PRN
  Filled 2014-07-22: qty 0.5

## 2014-07-22 MED ORDER — BUDESONIDE-FORMOTEROL FUMARATE 160-4.5 MCG/ACT IN AERO
1.0000 | INHALATION_SPRAY | Freq: Two times a day (BID) | RESPIRATORY_TRACT | Status: DC | PRN
  Administered 2014-07-22: 2 via RESPIRATORY_TRACT
  Filled 2014-07-22: qty 6

## 2014-07-22 MED ORDER — DEXTROSE 10 % IV SOLN
INTRAVENOUS | Status: DC
  Administered 2014-07-22: 100 mL/h via INTRAVENOUS

## 2014-07-22 MED ORDER — ACETAMINOPHEN 325 MG PO TABS: 650.0000 mg | ORAL_TABLET | Freq: Four times a day (QID) | ORAL | Status: DC | PRN

## 2014-07-22 MED ORDER — PANCRELIPASE (LIP-PROT-AMYL) 12000-38000 UNITS PO CPEP
48000.0000 [IU] | ORAL_CAPSULE | ORAL | Status: DC | PRN
  Filled 2014-07-22: qty 4

## 2014-07-22 MED ORDER — VANCOMYCIN HCL 10 G IV SOLR
1500.0000 mg | Freq: Once | INTRAVENOUS | Status: AC
  Administered 2014-07-22: 1500 mg via INTRAVENOUS
  Filled 2014-07-22: qty 1500

## 2014-07-22 MED ORDER — TIOTROPIUM BROMIDE MONOHYDRATE 18 MCG IN CAPS
18.0000 ug | ORAL_CAPSULE | Freq: Every day | RESPIRATORY_TRACT | Status: DC
  Administered 2014-07-22 – 2014-07-23 (×2): 18 ug via RESPIRATORY_TRACT
  Filled 2014-07-22: qty 5

## 2014-07-22 MED ORDER — FUROSEMIDE 10 MG/ML IJ SOLN
20.0000 mg | Freq: Once | INTRAMUSCULAR | Status: AC
  Administered 2014-07-22: 20 mg via INTRAVENOUS

## 2014-07-22 MED ORDER — ONDANSETRON HCL 4 MG/2ML IJ SOLN: 4.0000 mg | Freq: Four times a day (QID) | INTRAMUSCULAR | Status: DC | PRN

## 2014-07-22 MED ORDER — ACETAMINOPHEN 650 MG RE SUPP: 650.0000 mg | Freq: Four times a day (QID) | RECTAL | Status: DC | PRN

## 2014-07-22 MED ORDER — ENOXAPARIN SODIUM 40 MG/0.4ML ~~LOC~~ SOLN
40.0000 mg | Freq: Every day | SUBCUTANEOUS | Status: DC
  Administered 2014-07-22 – 2014-07-23 (×2): 40 mg via SUBCUTANEOUS
  Filled 2014-07-22 (×2): qty 0.4

## 2014-07-22 MED ORDER — ATORVASTATIN CALCIUM 40 MG PO TABS
40.0000 mg | ORAL_TABLET | Freq: Every day | ORAL | Status: DC
  Administered 2014-07-22 – 2014-07-23 (×2): 40 mg via ORAL
  Filled 2014-07-22 (×2): qty 1

## 2014-07-22 MED ORDER — ASPIRIN EC 325 MG PO TBEC
325.0000 mg | DELAYED_RELEASE_TABLET | ORAL | Status: DC
  Administered 2014-07-22: 325 mg via ORAL
  Filled 2014-07-22 (×2): qty 1

## 2014-07-22 MED ORDER — ONDANSETRON HCL 4 MG PO TABS: 4.0000 mg | ORAL_TABLET | Freq: Four times a day (QID) | ORAL | Status: DC | PRN

## 2014-07-22 MED ORDER — SPIRONOLACTONE 25 MG PO TABS
25.0000 mg | ORAL_TABLET | Freq: Every day | ORAL | Status: DC
  Administered 2014-07-22 – 2014-07-23 (×2): 25 mg via ORAL
  Filled 2014-07-22 (×2): qty 1

## 2014-07-22 MED ORDER — INSULIN ASPART 100 UNIT/ML ~~LOC~~ SOLN
0.0000 [IU] | Freq: Every day | SUBCUTANEOUS | Status: DC
  Administered 2014-07-22: 3 [IU] via SUBCUTANEOUS

## 2014-07-22 MED ORDER — HYDROCODONE-ACETAMINOPHEN 7.5-325 MG PO TABS
1.0000 | ORAL_TABLET | Freq: Four times a day (QID) | ORAL | Status: DC | PRN
  Administered 2014-07-22 – 2014-07-23 (×4): 1 via ORAL
  Filled 2014-07-22 (×4): qty 1

## 2014-07-22 MED ORDER — GABAPENTIN 300 MG PO CAPS
300.0000 mg | ORAL_CAPSULE | Freq: Every day | ORAL | Status: DC
  Administered 2014-07-22: 300 mg via ORAL
  Filled 2014-07-22 (×2): qty 1

## 2014-07-22 MED ORDER — FUROSEMIDE 40 MG PO TABS
40.0000 mg | ORAL_TABLET | Freq: Every day | ORAL | Status: DC
  Administered 2014-07-22 – 2014-07-23 (×2): 40 mg via ORAL
  Filled 2014-07-22 (×2): qty 1

## 2014-07-22 MED ORDER — PANCRELIPASE (LIP-PROT-AMYL) 12000-38000 UNITS PO CPEP
72000.0000 [IU] | ORAL_CAPSULE | Freq: Three times a day (TID) | ORAL | Status: DC
  Administered 2014-07-22 – 2014-07-23 (×5): 72000 [IU] via ORAL
  Filled 2014-07-22 (×7): qty 6

## 2014-07-22 MED ORDER — DEXTROSE 50 % IV SOLN
50.0000 mL | Freq: Once | INTRAVENOUS | Status: AC
  Administered 2014-07-22: 50 mL via INTRAVENOUS
  Filled 2014-07-22: qty 50

## 2014-07-22 MED ORDER — PIPERACILLIN-TAZOBACTAM 3.375 G IVPB 30 MIN
3.3750 g | Freq: Once | INTRAVENOUS | Status: AC
  Administered 2014-07-22: 3.375 g via INTRAVENOUS
  Filled 2014-07-22: qty 50

## 2014-07-22 MED ORDER — PANTOPRAZOLE SODIUM 40 MG PO TBEC
40.0000 mg | DELAYED_RELEASE_TABLET | Freq: Every day | ORAL | Status: DC
  Administered 2014-07-22 – 2014-07-23 (×2): 40 mg via ORAL
  Filled 2014-07-22 (×2): qty 1

## 2014-07-22 MED ORDER — SODIUM CHLORIDE 0.9 % IJ SOLN
3.0000 mL | Freq: Two times a day (BID) | INTRAMUSCULAR | Status: DC
  Administered 2014-07-22 (×2): 3 mL via INTRAVENOUS

## 2014-07-22 MED ORDER — FUROSEMIDE 10 MG/ML IJ SOLN
INTRAMUSCULAR | Status: AC
  Filled 2014-07-22: qty 4

## 2014-07-22 MED ORDER — INSULIN ASPART 100 UNIT/ML ~~LOC~~ SOLN
0.0000 [IU] | Freq: Three times a day (TID) | SUBCUTANEOUS | Status: DC
  Administered 2014-07-22: 5 [IU] via SUBCUTANEOUS
  Administered 2014-07-22 – 2014-07-23 (×2): 2 [IU] via SUBCUTANEOUS
  Administered 2014-07-23: 5 [IU] via SUBCUTANEOUS

## 2014-07-22 MED ORDER — METOPROLOL TARTRATE 50 MG PO TABS
50.0000 mg | ORAL_TABLET | Freq: Every day | ORAL | Status: DC
  Administered 2014-07-22 – 2014-07-23 (×2): 50 mg via ORAL
  Filled 2014-07-22 (×2): qty 1

## 2014-07-22 MED ORDER — LEVALBUTEROL HCL 0.63 MG/3ML IN NEBU: 0.6300 mg | INHALATION_SOLUTION | RESPIRATORY_TRACT | Status: DC | PRN

## 2014-07-22 MED ORDER — LISINOPRIL 40 MG PO TABS
40.0000 mg | ORAL_TABLET | Freq: Every day | ORAL | Status: DC
  Administered 2014-07-22 – 2014-07-23 (×2): 40 mg via ORAL
  Filled 2014-07-22 (×2): qty 1

## 2014-07-22 NOTE — Progress Notes (Signed)
Patient seen and evaluated earlier this AM by my associate. Please refer to his H and P regarding assessment and plan.  Given history of systolic and diastolic HF will discontinue IVF's and monitor blood sugars q 2 hours.  Will plan on encouraging po intake.  Will reassess next am.  Velvet Bathe

## 2014-07-22 NOTE — Care Management Note (Signed)
    Page 1 of 1   07/23/2014     10:55:04 AM CARE MANAGEMENT NOTE 07/23/2014  Patient:  Stephen [name], Stephen [name]   Account Number:  [id number]  Date Initiated:  07/22/2014  Documentation initiated by:  Marvetta Gibbons  Subjective/Objective Assessment:   Pt admitted with hypoglycemia/hypothermia, along with leg ulcers     Action/Plan:   PTA pt lived at home- was active with [facility] for HH-COPD program with RN and HH-PT- will need resumption orders for discharge   Anticipated DC Date:  07/25/2014   Anticipated DC Plan:  [place]         [facility] Choice  Resumption Of Svcs/PTA Provider   Choice offered to / List presented to:  C-1 Patient        [place] arranged  HH-10 DISEASE MANAGEMENT  HH-1 RN  [place]      [place].   Status of service:  Completed, signed off Medicare Important Message given?  NA - LOS <3 / Initial given by admissions (If response is "NO", the following Medicare IM given date fields will be blank) Date Medicare IM given:   Medicare IM given by:   Date Additional Medicare IM given:   Additional Medicare IM given by:    Discharge Disposition:  [place]  Per UR Regulation:  Reviewed for med. necessity/level of care/duration of stay  If discussed at [place] of Stay Meetings, dates discussed:    Comments:  07-22-14 [address] [place] [phone] CM did speak with pt in reference to [facility] services and resumption services. SOC to begin within 24-48 hours of d/c. Pt states he has no issues with medication management. NO further needs from CM at this time.

## 2014-07-22 NOTE — Progress Notes (Signed)
Called to ER to get report on Pt to be admitted on room 15.

## 2014-07-22 NOTE — Progress Notes (Signed)
Utilization review completed.  

## 2014-07-22 NOTE — ED Notes (Signed)
hospitalist at the bedside 

## 2014-07-22 NOTE — Consult Note (Addendum)
WOC wound consult note  Reason for Consult: Consult requested for LLE wounds.  Pt familiar to [place] team from previous admission last week. Patient with chronic, non-healing wounds and has been followed during the last admission by the vascular team. All  wounds are circular and covered with dry adherent eschar; this is consistent with arterial insufficiency.  ABI in 2014 was .3, to left leg; this is inadequate for optimal wound healing. No significant ulcerations on the right LE and there is some edema noted to that extremity.  Measurements: Medial thigh: 2.5cm x 1cm, Lateral: 1.5cm x 1.5cm, Medial: 1.8cm x 1.5cm, Pretibial two wounds: proximal: 0.8cm x 0.8cm and distal: 0.5cm x 0.5cm, Anterior: 2cm x 1cm, Medial near malleolus: 1cm x 1.8cm, Posterior: two wounds, approx  3.5cm x 2cm area.  Wound bed:All sites with dry stable eschar tightly adhered over woundbed. Drainage (amount, consistency, odor) No odor or drainage or fluctuance. Periwound:Some erythema surrounding wounds   Dressing procedure/placement/frequency: It is best practice to leave dry stable eschar intact when there is inadequate perfusion.  If aggressive treatment is desired, please re-consult VVS team.  Wounds may remain open to air.  If drainage occurs, a soft silicone nonadherent foam dressing can be applied. Please re-consult if further assistance is needed.  Thank-you,  Julien Girt MSN, [place], [place], [place], [place]

## 2014-07-22 NOTE — ED Provider Notes (Signed)
CSN: [id number]     Arrival date & time 07/21/14  2318 History   First MD Initiated Contact with Patient 07/21/14 2331     Chief Complaint  Patient presents with  . Hypoglycemia     (Consider location/radiation/quality/duration/timing/severity/associated sxs/prior Treatment) HPI Stephen [name] is a 73 y.o. male with a past medical history of hypertension diabetes hyperlipidemia and coronary artery disease presenting with hypoglycemia. Patient states he went to bed around 6 PM and woke up around 8 PM diaphoretic. He states he is very nauseated at the time with no emesis. Upon EMS arrival his fingerstick was 26, he was given an amp of D50 and this rose to 120. Patient was recently admitted to the ICU last week for respiratory secondary to COPD and CHF. Patient denied any symptoms at home other than the diaphoresis and nausea. Denying pain anywhere. States he has not had any fevers recently. Patient has no further complaints.  10 Systems reviewed and are negative for acute change except as noted in the HPI.    Past Medical History  Diagnosis Date  . Diabetes mellitus   . Hypertension   . Pancreatitis   . Neuropathy   . Hyperlipemia   . Peripheral vascular disease     a. 10/2013: severely abnormal ABIs, for OP evaluation.  [name] GERD (gastroesophageal reflux disease)   . S/P colonoscopy July 2003    hyperplastic polyp, diverticulosis, anal papilla and internal hemorrhoids r  . S/P endoscopy 2003    Dr. Braulio Bosch: erosive reflux esophagitis , PUD  . PUD (peptic ulcer disease)     diagnosed via EGD by Dr. Braulio Bosch at [facility]  . Diverticulosis   . Hemorrhoids   . Coronary artery disease      a. BMS to LAD 1999. b. HSRA to LAD and stent to RCA 2007. c. NSTEMI/CABG x 3 in 04/2010. d. NSTEMI 10/2013 - secondary to demand ischemia in setting of COPD exacerbation with severe underlying CAD - cath with 3/3 patent grafts, for medical therapy.   [name] COPD (chronic obstructive pulmonary disease)      a. Ongoing tobacco abuse.  . Arthritis   . Neutropenia     a. Dx 10/2013 on labs - instructed to f/u PCP.  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   . Ischemic cardiomyopathy     a. previously EF 30% in 2011. b. 10/2013: EF 45-50% by echo.  . Alcoholism     a. Remote alcoholism  . 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   Past Surgical History  Procedure Laterality Date  . Coronary artery bypass graft  June 2011    X3  . Right inguinal hernia repair    . Cholecystectomy      3-4 years ago  . Appendectomy      age 19  . Cataracts    . Colonoscopy  05/12/2002    Dr. [name]- diverticulosis, anal papilla, internal hemorrhoids, rectal polyps  . [name] dilation  04/16/2012    Schatzki's ring-status post dilation and disruption/ as described above. Grade 1 esophageal varices. Small hiatal hernia.  [name] dilation  04/16/2012    Procedure: [name] DILATION;  Surgeon: Daneil Dolin, MD;  Location: [facility];  Service: Endoscopy;  Laterality: N/A;  . Colonoscopy  08/10/2012    Procedure: COLONOSCOPY;  Surgeon: Daneil Dolin, MD;  Location: [facility];  Service: Endoscopy;  Laterality: N/A;  9:45 needs 30 mins extra /have Glucagon on hand   Family History  Problem Relation Age of Onset  . Coronary artery disease Mother   . Coronary artery disease Father   . Diabetes Sister   . Suicidality Brother   . Cirrhosis Brother   . Colon cancer Neg Hx    History  Substance Use Topics  . Smoking status: Current Every Day Smoker -- 8.00 packs/day for 63 years    Types: Cigarettes  . Smokeless tobacco: Current User     Comment: on last pack now then will quit  . Alcohol Use: No     Comment: Quit 1985    Review of Systems    Allergies  Levaquin  Home Medications   Prior to Admission medications   Medication Sig Start Date End Date Taking? Authorizing Provider   albuterol-ipratropium (COMBIVENT) 18-103 MCG/ACT inhaler Inhale 1-2 puffs into the lungs every 6 (six) hours as needed for wheezing or shortness of breath. 11/02/13   Dayna N Dunn, PA-C  aspirin EC 325 MG tablet Take 325 mg by mouth every morning.    Historical Provider, MD  atorvastatin (LIPITOR) 40 MG tablet Take 40 mg by mouth daily.    Historical Provider, MD  clopidogrel (PLAVIX) 75 MG tablet Take 75 mg by mouth daily.    Historical Provider, MD  doxycycline (VIBRAMYCIN) 100 MG capsule Take 1 capsule (100 mg total) by mouth 2 (two) times daily. 07/14/14   Shanker Kristeen Mans, MD  fenofibrate 160 MG tablet Take 160 mg by mouth daily with breakfast.     Historical Provider, MD  furosemide (LASIX) 20 MG tablet Take 2 tablets (40 mg total) by mouth daily. 06/28/14   Maudry Diego, MD  gabapentin (NEURONTIN) 100 MG capsule Take 300 mg by mouth at bedtime.  06/08/14   Historical Provider, MD  glipiZIDE (GLUCOTROL XL) 2.5 MG 24 hr tablet Take 2.5 mg by mouth 2 (two) times daily.     Historical Provider, MD  guaiFENesin (MUCINEX) 600 MG 12 hr tablet Take 1 tablet (600 mg total) by mouth 2 (two) times daily. 07/14/14   Shanker Kristeen Mans, MD  Insulin Glargine (LANTUS SOLOSTAR) 100 UNIT/ML Solostar Pen Inject 20 Units into the skin daily at 10 pm. 07/14/14   Jonetta Osgood, MD  levalbuterol (XOPENEX) 1.25 MG/3ML nebulizer solution Take 0.63 mg by nebulization every 4 (four) hours as needed for wheezing or shortness of breath. 07/14/14   Shanker Kristeen Mans, MD  lisinopril (PRINIVIL,ZESTRIL) 20 MG tablet Take 2 tablets (40 mg total) by mouth daily. 07/14/14   Shanker Kristeen Mans, MD  loratadine (CLARITIN) 10 MG tablet Take 10 mg by mouth daily.    Historical Provider, MD  metFORMIN (GLUCOPHAGE) 1000 MG tablet Take 1,000 mg by mouth 2 (two) times daily with a meal.    Historical Provider, MD  metoprolol tartrate (LOPRESSOR) 25 MG tablet Take 25 mg by mouth 2 (two) times daily.    Historical Provider, MD   nitroGLYCERIN (NITROSTAT) 0.4 MG SL tablet Place 0.4 mg under the tongue every 5 (five) minutes x 3 doses as needed.    Historical Provider, MD  Pancrelipase, Lip-Prot-Amyl, (CREON) 24000 UNITS CPEP Take 3 capsules by mouth 4 (four) times daily. To take three to four times daily (PER PHARMACY RECORD)    Historical Provider, MD  pantoprazole (PROTONIX) 40 MG tablet Take 1 tablet (40 mg total) by mouth daily. 11/02/13  Dayna N Dunn, PA-C  predniSONE (DELTASONE) 5 MG tablet Label  & dispense according to the schedule below. 10 Pills PO for 3 days then, 8 Pills PO for 3 days, 6 Pills PO for 3 days, 4 Pills PO for 3 days, 2 Pills PO for 3 days, 1 Pills PO for 3 days, 1/2 Pill  PO for 3 days then STOP. Total 95 pills. 07/14/14   Shanker Kristeen Mans, MD  spironolactone (ALDACTONE) 25 MG tablet Take 25 mg by mouth daily.      Historical Provider, MD  SYMBICORT 160-4.5 MCG/ACT inhaler Inhale 2 puffs into the lungs 2 (two) times daily.  07/04/12   Historical Provider, MD  tiotropium (SPIRIVA HANDIHALER) 18 MCG inhalation capsule Place 1 capsule (18 mcg total) into inhaler and inhale daily. 07/14/14   Shanker Kristeen Mans, MD   Temp(Src) 95.2 F (35.1 C) (Rectal) Physical Exam  Nursing note and vitals reviewed. Constitutional: He is oriented to person, place, and time. Vital signs are normal. He appears well-developed and well-nourished.  Non-toxic appearance. He does not appear ill. No distress.  Hard of hearing.  HENT:  Head: Normocephalic and atraumatic.  Nose: Nose normal.  Mouth/Throat: Oropharynx is clear and moist. No oropharyngeal exudate.  Eyes: Conjunctivae and EOM are normal. Pupils are equal, round, and reactive to light. No scleral icterus.  Neck: Normal range of motion. Neck supple. No tracheal deviation, no edema, no erythema and normal range of motion present. No mass and no thyromegaly present.  Cardiovascular: Normal rate, regular rhythm, S1 normal, S2 normal, normal heart sounds, intact distal  pulses and normal pulses.  Exam reveals no gallop and no friction rub.   No murmur heard. Pulses:      Radial pulses are 2+ on the right side, and 2+ on the left side.       Dorsalis pedis pulses are 2+ on the right side, and 2+ on the left side.  Pulmonary/Chest: Effort normal and breath sounds normal. No respiratory distress. He has no wheezes. He has no rhonchi. He has no rales.  Abdominal: Soft. Normal appearance and bowel sounds are normal. He exhibits no distension, no ascites and no mass. There is no hepatosplenomegaly. There is no tenderness. There is no rebound, no guarding and no CVA tenderness.  Bruising seen in the bilateral lower quadrants.  Musculoskeletal: Normal range of motion. He exhibits no edema and no tenderness.  Bilateral lower extremities are cold to touch. There is a palpable pulse in the left lower extremity. No palpable pulse in the right lower extremity. Doppler pending.  Lymphadenopathy:    He has no cervical adenopathy.  Neurological: He is alert and oriented to person, place, and time. He has normal strength. No cranial nerve deficit or sensory deficit. GCS eye subscore is 4. GCS verbal subscore is 5. GCS motor subscore is 6.  Skin: Skin is warm, dry and intact. No petechiae and no rash noted. He is not diaphoretic. No erythema. No pallor.  Psychiatric: He has a normal mood and affect. His behavior is normal. Judgment normal.    ED Course  Procedures (including critical care time) Labs Review Labs Reviewed  CBC WITH DIFFERENTIAL - Abnormal; Notable for the following:    WBC 16.5 (*)    RBC 3.75 (*)    Hemoglobin 11.4 (*)    HCT 35.2 (*)    Neutrophils Relative % 89 (*)    Neutro Abs 14.6 (*)    Lymphocytes Relative 4 (*)  Monocytes Absolute 1.2 (*)    All other components within normal limits  COMPREHENSIVE METABOLIC PANEL - Abnormal; Notable for the following:    Albumin 2.5 (*)    Total Bilirubin <0.2 (*)    GFR calc non Af Amer 86 (*)    All  other components within normal limits  LIPASE, BLOOD - Abnormal; Notable for the following:    Lipase 7 (*)    All other components within normal limits  URINALYSIS, ROUTINE W REFLEX MICROSCOPIC - Abnormal; Notable for the following:    Hgb urine dipstick MODERATE (*)    Protein, ur >300 (*)    All other components within normal limits  PRO B NATRIURETIC PEPTIDE - Abnormal; Notable for the following:    Pro B Natriuretic peptide (BNP) 3366.0 (*)    All other components within normal limits  CBG MONITORING, ED - Abnormal; Notable for the following:    Glucose-Capillary 120 (*)    All other components within normal limits  URINE CULTURE  CULTURE, BLOOD (ROUTINE X 2)  CULTURE, BLOOD (ROUTINE X 2)  URINE MICROSCOPIC-ADD ON  BASIC METABOLIC PANEL  CBC  I-STAT CG4 LACTIC ACID, ED  I-STAT TROPOININ, ED  CBG MONITORING, ED    Imaging Review Dg Chest 2 View  07/22/2014   CLINICAL DATA:  Shortness of breath and cough.  Hypoglycemia.  EXAM: CHEST  2 VIEW  COMPARISON:  Chest radiograph performed 07/10/2014  FINDINGS: The lungs are well-aerated. Small bilateral pleural effusions are seen. Increased interstitial markings may reflect mild interstitial edema. Vascular congestion is noted. No pneumothorax is identified.  The heart is borderline normal in size. The patient is status post median sternotomy, with evidence of prior CABG. No acute osseous abnormalities are seen.  IMPRESSION: Small bilateral pleural effusions seen. Vascular congestion noted. Increased interstitial markings may reflect mild interstitial edema.   Electronically Signed   By: Garald Balding M.D.   On: 07/22/2014 01:14     EKG Interpretation   Date/Time:  Thursday July 21 2014 23:40:11 EDT Ventricular Rate:  79 PR Interval:  170 QRS Duration: 156 QT Interval:  447 QTC Calculation: 512 R Axis:   85 Text Interpretation:  Sinus rhythm IVCD No significant change since last  tracing Confirmed by Glynn Octave  [phone]) on 07/22/2014 7:24:14 AM      MDM   Final diagnoses:  None    Patient seen emergency department for hypoglycemia. He was given broad-spectrum antibiotics first of frequent hypothermia. Next is workup was negative. This may be due to his medication use. Upon repeat assessment emergency department his fingerstick was 58. He was given another amp of D50 and started on a D10 drip. Patient was admitted to step down for further control of his hypoglycemia.    Everlene Balls, MD 07/22/14 [phone]

## 2014-07-22 NOTE — H&P (Addendum)
Patient's PCP: Reynold Bowen, NT  Chief Complaint: Low blood sugar  History of Present Illness: Stephen [name] is a 73 y.o. Caucasian male with history of diabetes, hypertension, hyperlipidemia, peripheral neuropathy, GERD, coronary artery disease, COPD, chronic systolic congestive heart failure, and left lower extremity ischemic ulcers due to underlying peripheral vascular disease who presents with the above complaints.  Patient was recently hospitalized from 07/09/2014 to 07/14/2014 for COPD exacerbation.  He indicates that he has been at his baseline.  At 6 p.m. last night he went to bed, however at 8 p.m. he will cut and wrenched in sweat.  He felt something was wrong and called 911.  EMS checked his blood sugar which was 26.  He was given an amp of D50 by EMS.  He had another hypoglycemic episode in the emergency department with a blood sugar of 34 and was given another amp of D50.  He was subsequently started on D. 10 at 100 cc per hour.  Hospitalist service was asked to admit the patient for further care and management.  He denies any recent fevers, chills, nausea, vomiting, chest pain, shortness of breath, abdominal pain, diarrhea, headaches or vision changes.  Of note, patient was found to be hypothermic in the emergency department which improved on warming blanket and correcting blood sugars.  Review of Systems: All systems reviewed with the patient and positive as per history of present illness, otherwise all other systems are negative.  Past Medical History  Diagnosis Date  . Diabetes mellitus   . Hypertension   . Pancreatitis   . Neuropathy   . Hyperlipemia   . Peripheral vascular disease     a. 10/2013: severely abnormal ABIs, for OP evaluation.  [name] GERD (gastroesophageal reflux disease)   . S/P colonoscopy July 2003    hyperplastic polyp, diverticulosis, anal papilla and internal hemorrhoids r  . S/P endoscopy 2003    Dr. Braulio Bosch: erosive reflux esophagitis , PUD  . PUD  (peptic ulcer disease)     diagnosed via EGD by Dr. Braulio Bosch at [facility]  . Diverticulosis   . Hemorrhoids   . Coronary artery disease      a. BMS to LAD 1999. b. HSRA to LAD and stent to RCA 2007. c. NSTEMI/CABG x 3 in 04/2010. d. NSTEMI 10/2013 - secondary to demand ischemia in setting of COPD exacerbation with severe underlying CAD - cath with 3/3 patent grafts, for medical therapy.   [name] COPD (chronic obstructive pulmonary disease)     a. Ongoing tobacco abuse.  . Arthritis   . Neutropenia     a. Dx 10/2013 on labs - instructed to f/u PCP.  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   . Ischemic cardiomyopathy     a. previously EF 30% in 2011. b. 10/2013: EF 45-50% by echo.  . Alcoholism     a. Remote alcoholism  . 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   Past Surgical History  Procedure Laterality Date  . Coronary artery bypass graft  June 2011    X3  . Right inguinal hernia repair    . Cholecystectomy      3-4 years ago  . Appendectomy      age 89  . Cataracts    . Colonoscopy  05/12/2002    Dr. [name]- diverticulosis, anal papilla, internal hemorrhoids, rectal polyps  .  [name] dilation  04/16/2012    Schatzki's ring-status post dilation and disruption/ as described above. Grade 1 esophageal varices. Small hiatal hernia.  [name] dilation  04/16/2012    Procedure: [name] DILATION;  Surgeon: Daneil Dolin, MD;  Location: [facility];  Service: Endoscopy;  Laterality: N/A;  . Colonoscopy  08/10/2012    Procedure: COLONOSCOPY;  Surgeon: Daneil Dolin, MD;  Location: [facility];  Service: Endoscopy;  Laterality: N/A;  9:45 needs 30 mins extra /have Glucagon on hand   Family History  Problem Relation Age of Onset  . Coronary artery disease Mother   . Coronary artery disease Father   . Diabetes Sister   . Suicidality Brother   . Cirrhosis Brother   .  Colon cancer Neg Hx    History   Social History  . Marital Status: Married    Spouse Name: N/A    Number of Children: N/A  . Years of Education: N/A   Occupational History  . Not on file.   Social History Main Topics  . Smoking status: Current Every Day Smoker -- 8.00 packs/day for 63 years    Types: Cigarettes  . Smokeless tobacco: Current User  . Alcohol Use: No     Comment: Quit 1985  . Drug Use: No  . Sexual Activity: Not Currently   Other Topics Concern  . Not on file   Social History Narrative  . No narrative on file   Allergies: Levaquin  Home Meds: Prior to Admission medications   Medication Sig Start Date End Date Taking? Authorizing Provider  albuterol-ipratropium (COMBIVENT) 18-103 MCG/ACT inhaler Inhale 1-2 puffs into the lungs every 6 (six) hours as needed for wheezing or shortness of breath. 11/02/13  Yes Dayna N Dunn, PA-C  aspirin EC 325 MG tablet Take 325 mg by mouth every other day.    Yes Historical Provider, MD  atorvastatin (LIPITOR) 40 MG tablet Take 40 mg by mouth daily.   Yes Historical Provider, MD  fenofibrate 160 MG tablet Take 160 mg by mouth daily with breakfast.    Yes Historical Provider, MD  furosemide (LASIX) 20 MG tablet Take 2 tablets (40 mg total) by mouth daily. 06/28/14  Yes Maudry Diego, MD  gabapentin (NEURONTIN) 100 MG capsule Take 300 mg by mouth at bedtime.  06/08/14  Yes Historical Provider, MD  glipiZIDE (GLUCOTROL XL) 2.5 MG 24 hr tablet Take 5 mg by mouth 2 (two) times daily.    Yes Historical Provider, MD  HYDROcodone-acetaminophen (NORCO) 7.5-325 MG per tablet Take 1 tablet by mouth every 6 (six) hours as needed for moderate pain.   Yes Historical Provider, MD  Insulin Glargine (LANTUS SOLOSTAR) 100 UNIT/ML Solostar Pen Inject 30 Units into the skin daily.   Yes Historical Provider, MD  levalbuterol (XOPENEX) 1.25 MG/3ML nebulizer solution Take 0.63 mg by nebulization every 4 (four) hours as needed for wheezing or shortness  of breath. 07/14/14  Yes Shanker Kristeen Mans, MD  lisinopril (PRINIVIL,ZESTRIL) 20 MG tablet Take 2 tablets (40 mg total) by mouth daily. 07/14/14  Yes Shanker Kristeen Mans, MD  loratadine (CLARITIN) 10 MG tablet Take 10 mg by mouth daily.   Yes Historical Provider, MD  metFORMIN (GLUCOPHAGE) 1000 MG tablet Take 1,000 mg by mouth daily with breakfast.    Yes Historical Provider, MD  metoprolol tartrate (LOPRESSOR) 25 MG tablet Take 50 mg by mouth daily.    Yes Historical Provider, MD  nitroGLYCERIN (NITROSTAT) 0.4 MG SL tablet Place  0.4 mg under the tongue every 5 (five) minutes x 3 doses as needed.   Yes Historical Provider, MD  Pancrelipase, Lip-Prot-Amyl, (CREON) 24000 UNITS CPEP Take 2-3 capsules by mouth See admin instructions. Take 3 capsules with all meals and 2 with snacks   Yes Historical Provider, MD  pantoprazole (PROTONIX) 40 MG tablet Take 1 tablet (40 mg total) by mouth daily. 11/02/13  Yes Dayna N Dunn, PA-C  spironolactone (ALDACTONE) 25 MG tablet Take 25 mg by mouth daily.     Yes Historical Provider, MD  SYMBICORT 160-4.5 MCG/ACT inhaler Inhale 1-2 puffs into the lungs 2 (two) times daily as needed (shorntess of breath).  07/04/12  Yes Historical Provider, MD  tiotropium (SPIRIVA HANDIHALER) 18 MCG inhalation capsule Place 1 capsule (18 mcg total) into inhaler and inhale daily. 07/14/14  Yes Shanker Kristeen Mans, MD  doxycycline (VIBRAMYCIN) 100 MG capsule Take 1 capsule (100 mg total) by mouth 2 (two) times daily. 07/14/14   Shanker Kristeen Mans, MD    Physical Exam: Blood pressure 154/59, pulse 77, temperature 97.5 F (36.4 C), temperature source Oral, resp. rate 17, SpO2 95.00%. General: Awake, Oriented x3, No acute distress, disheveled in appearance. HEENT: EOMI, Moist mucous membranes Neck: Supple CV: S1 and S2 Lungs: Scattered wheezing bilaterally, moderate air movement. Abdomen: Soft, Nontender, Nondistended, +bowel sounds. Ext: Good pulses.  Black eschars on his left lower extremity  with surrounding erythema. No clubbing or cyanosis noted. Neuro: Cranial Nerves II-XII grossly intact. Has 5/5 motor strength in upper and lower extremities.  Lab results:  Recent Labs  07/21/14 2359  NA 142  K 4.0  CL 102  CO2 28  GLUCOSE 85  BUN 12  CREATININE 0.84  CALCIUM 9.2    Recent Labs  07/21/14 2359  AST 24  ALT 16  ALKPHOS 51  BILITOT <0.2*  PROT 6.5  ALBUMIN 2.5*    Recent Labs  07/21/14 2359  LIPASE 7*    Recent Labs  07/21/14 2359  WBC 16.5*  NEUTROABS 14.6*  HGB 11.4*  HCT 35.2*  MCV 93.9  PLT 311   No results found for this basename: CKTOTAL, CKMB, CKMBINDEX, TROPONINI,  in the last 72 hours No components found with this basename: POCBNP,  No results found for this basename: DDIMER,  in the last 72 hours No results found for this basename: HGBA1C,  in the last 72 hours No results found for this basename: CHOL, HDL, LDLCALC, TRIG, CHOLHDL, LDLDIRECT,  in the last 72 hours No results found for this basename: TSH, T4TOTAL, FREET3, T3FREE, THYROIDAB,  in the last 72 hours No results found for this basename: VITAMINB12, FOLATE, FERRITIN, TIBC, IRON, RETICCTPCT,  in the last 72 hours Imaging results:  Dg Chest 2 View  07/22/2014   CLINICAL DATA:  Shortness of breath and cough.  Hypoglycemia.  EXAM: CHEST  2 VIEW  COMPARISON:  Chest radiograph performed 07/10/2014  FINDINGS: The lungs are well-aerated. Small bilateral pleural effusions are seen. Increased interstitial markings may reflect mild interstitial edema. Vascular congestion is noted. No pneumothorax is identified.  The heart is borderline normal in size. The patient is status post median sternotomy, with evidence of prior CABG. No acute osseous abnormalities are seen.  IMPRESSION: Small bilateral pleural effusions seen. Vascular congestion noted. Increased interstitial markings may reflect mild interstitial edema.   Electronically Signed   By: Garald Balding M.D.   On: 07/22/2014 01:14   Ct  Angio Ao+bifem W/cm &/or Wo/cm  07/11/2014   CLINICAL  DATA:  Nonhealing ulcers within the lower extremities consistent with aortoiliac disease Evaluate for PAD.  EXAM: CT ANGIOGRAPHY OF ABDOMINAL AORTA WITH ILIOFEMORAL RUNOFF  TECHNIQUE: Multidetector CT imaging of the abdomen, pelvis and lower extremities was performed using the standard protocol during bolus administration of intravenous contrast. Multiplanar CT image reconstructions and MIPs were obtained to evaluate the vascular anatomy.  CONTRAST:  162mL OMNIPAQUE IOHEXOL 350 MG/ML SOLN  COMPARISON:  CT abdomen pelvis - 06/04/2011; chest radiograph - 07/10/2014  FINDINGS: Vascular Findings:  Abdominal aorta: There is a moderate to large amount of eccentric mixed calcified and noncalcified slightly irregular atherosclerotic plaque throughout the normal caliber abdominal aorta, not resulting in a hemodynamically significant stenosis. No abdominal aortic dissection or periaortic stranding.  Celiac artery: Widely patent without a hemodynamically significant stenosis. The left gastric artery is incidentally noted to have a separate origin to the right lateral aspect of the main celiac trunk. Otherwise, conventional branching pattern.  SMA: There is a minimal amount of eccentric mixed calcified and noncalcified atherosclerotic plaque involving the cranial aspect of the origin the SMA, not resulting in a hemodynamically significant stenosis. Additionally, there is a minimal amount of eccentric calcified plaque within in the mid aspect of the main trunk of the SMA (axial image 64, series 5, also not resulting in hemodynamically significant stenosis. Conventional branching pattern. The distal tributaries of the SMA are widely patent without discrete filling defect to suggest distal embolism.  Right Renal artery: Solitary; there is a mixture of calcified and noncalcified atherosclerotic plaque involving the origin of the right renal artery, not resulting in a  hemodynamically significant stenosis.  Left Renal artery: Solitary ; there is a very minimal amount of eccentric predominantly calcified atherosclerotic plaque involving the origin of the left renal artery, not resulting in hemodynamically significant stenosis.  IMA: Widely patent.   --------------------------------------------------------------------------------  RIGHT LOWER EXTREMITY VASCULATURE:  Right-sided pelvic vasculature: There is a moderate amount of mixed calcified and noncalcified atherosclerotic plaque throughout the right common iliac artery, not resulting in hemodynamically significant stenosis. The right internal iliac artery is heavily disease though patent and of normal caliber. There is a large amount of mixed calcified and noncalcified atherosclerotic plaque throughout the proximal aspect of the right external iliac artery resulting in tandem areas of high-grade (grade in 70% stenosis (representative images axial image 106, series 5, coronal images 90 and 93, series 10).  Right common femoral artery: There is a mixture of calcified and noncalcified atherosclerotic plaque within the right common femoral artery which results in approximately 50% luminal narrowing (image 129, series 5  Right deep femoral artery: Disease proximally though patent and supplying the majority of the blood flow to the right lower extremity.  Right superficial femoral artery: Occluded at its origin with reconstituted atretic flow through its mid aspect (representative axial image 179, series 5) with eventual occlusion again distally (axial image 234, series 5.  Right popliteal artery: There is reconstitution of the right above knee popliteal artery via deep thigh and geniculate collaterals. There is a mixture of calcified and noncalcified atherosclerotic plaque throughout the right above knee popliteal artery which results in multiple areas of high-grade (> 70%) luminal narrowing. The right below-knee popliteal artery is  widely patent.  Right lower leg: There is eccentric calcified atherosclerotic plaque involving the proximal anterior tibial and tibioperoneal trunks, however there is a preserved three-vessel runoff to the right foot. The right dorsalis pedis artery is patent to the level of the forefoot.   ---------------------------------------------------------------------------------  LEFT LOWER EXTREMITY VASCULATURE:  Left-sided pelvic vasculature: There is eccentric calcified and noncalcified atherosclerotic plaque throughout the left external iliac artery resulting in tandem long segment areas of high-grade (greater 70%) luminal narrowing (representative axial images 91 and 97, series 5, coronal image 92, series 10), though note, intraluminal evaluation is degraded secondary to mural calcification. The left internal iliac artery is heavily diseased though patent and of normal caliber. There is eccentric mixed calcified and noncalcified atherosclerotic plaque throughout the left external iliac artery resulting in tandem long segment areas of high-grade (greater 70%) luminal narrowing).  Left common femoral artery: There is a mixture of calcified and noncalcified atherosclerotic plaque within the left common femoral artery which results an approximately 50% luminal narrowing (image 130, series 5).  Left deep femoral artery: Disease status origin though patent supplying the majority the blood flow to the left leg.  Left superficial femoral artery: There is a very minimal amount of atretic flow involving the very proximal aspect of the left superficial femoral artery. The vessel as otherwise occluded throughout its course through the level of the adductor canal.  Left popliteal artery: There is reconstitution of flow of the left above knee popliteal artery at via deep thigh an geniculate collaterals. There is eccentric mixed calcified and noncalcified atherosclerotic plaque throughout the left above knee popliteal artery  resulting in tandem areas of hemodynamically significant stenosis. The left below-knee popliteal artery is widely patent without hemodynamically significant stenosis.  Left lower leg: There is eccentric atherosclerotic plaque involving the takeoff and proximal aspect of the left anterior tibial artery as well as the distal tibioperoneal trunk, however there is a preserved three-vessel runoff to the left lower leg and foot. The left-sided dorsalis pedis artery is patent to the level of the forefoot.  Review of the MIP images confirms the above findings.   --------------------------------------------------------------------------------  Nonvascular Findings:  Evaluation of the abdominal organs is limited to the arterial phase of enhancement.  Normal hepatic contour. No discrete hyperenhancing hepatic lesions. Punctate calcification within the caudal aspect of the right lobe of the liver (image 57, series 5, is unchanged in favored to the sequela of prior granulomatous infection and/or injury. No ascites.  There is symmetric enhancement of the bilateral kidneys. Note is made of an approximately 1.2 cm partially exophytic hypo attenuating (17 Hounsfield unit) cyst arising from the posterior aspect of the inferior pole the right kidney (axial image 68, series 5). No discrete left-sided renal lesions. No definite renal stones on this postcontrast examination. No urinary obstruction. Minimal amount of grossly symmetric bilateral perinephric stranding. Normal appearance of the bilateral adrenal glands and spleen. Stable sequela of chronic calcific pancreatitis. No peripancreatic stranding or definitive pancreatic ductal dilatation on this non pancreatic protocol CT.  Scattered minimal colonic diverticulosis without evidence of diverticulitis. Moderate colonic stool burden without evidence of obstruction. No discrete area of bowel wall thickening. Normal appearance of the appendix. No pneumoperitoneum, pneumatosis or portal  venous gas.  Prostate is enlarged with mass effect upon the urinary bladder. Normal appearance of the urinary bladder given degree distention. No free fluid within the pelvic cul-de-sac. Incidentally noted bilateral moderate to large sized hydroceles.  Limited visualization of the lower thorax demonstrates small bilateral effusions with associated bibasilar heterogeneous opacities, likely atelectatic left cyst. There is minimal subsegmental atelectasis within the imaged caudal aspects of the right middle lobe and lingula. There is mild diffuse bronchial wall thickening within the imaged bilateral lower lobe bronchi.  Borderline cardiomegaly, in particular, there  is enlargement of the left ventricle. Post median sternotomy with atherosclerotic plaque within the native coronary arteries.  No acute or aggressive osseous abnormalities. Mild degenerate change of the bilateral hips.  There is mild diffuse body wall anasarca.  IMPRESSION: Vascular Impression:  1. Moderate to large amount of mixed calcified and noncalcified slightly irregular atherosclerotic plaque with a normal caliber abdominal aorta, not resulting in a hemodynamically significant stenosis. 2. The major branch vessels of the abdominal aorta (including the IMA) appear patent without a hemodynamically significant stenosis. Right Lower Extremity Vascular Impression:  1. Eccentric mixed calcified and noncalcified atherosclerotic plaque results in tandem areas of high-grade (> 70%) luminal narrowing throughout the proximal aspect of the right external iliac artery. 2. Eccentric mixed calcified and noncalcified atherosclerotic plaque results in approximately 50% luminal narrowing of the right common femoral artery. 3. The right superficial femoral artery is largely occluded throughout its course with very minimal atretic throw through its mid aspect. 4. The right above knee popliteal artery is reconstituted via deep thigh and geniculate collaterals though is  heavily diseased. The right below-knee popliteal artery is widely patent. 5. Eccentric calcified plaque involving the proximal aspects of the anterior tibial artery and tibial peritoneal trunk, however there is a preserved three-vessel runoff to the right lower leg and foot. The right dorsalis pedis artery is patent to the level of the forefoot. Left Lower Extremity Vascular Impression:  1. Eccentric mixed calcified and noncalcified irregular atherosclerotic plaque results in long segment high-grade (greater 70%) luminal narrowing throughout the left common and external iliac arteries. 2. Eccentric mixed calcified and noncalcified atherosclerotic plaque results in approximately 50% luminal narrowing of the left common femoral artery. 3. Very minimal atretic flow involving the very proximal aspect of the left superficial femoral artery. The left superficial femoral artery it is otherwise occluded throughout its course through the level of the adductor canal. 4. The left above knee popliteal artery is reconstituted via deep thigh and geniculate collaterals those heavily diseased. The left below-knee popliteal artery is widely patent. 5. There are several areas of eccentric calcified atherosclerotic plaque involving the anterior tibial and tibioperoneal trunk, however there is a preserved three-vessel runoff to the left lower leg and foot. The left dorsalis pedis artery is patent to the level of the forefoot. Non vascular Impression:  1. Cardiomegaly. Post median sternotomy with atherosclerotic plaque within the native coronary arteries. 2. Suspected pulmonary edema with small bilateral effusions and mild diffuse body wall anasarca/third-spacing. Clinical correlation is advised. 3. Bronchial wall thickening suggestive of airways disease/bronchitis. 4. Instilling noted moderate to large size bilateral hydroceles. 5. Stable sequela of chronic calcific pancreatitis. No definitive peripancreatic stranding to suggest a  superimposed acute process.   Electronically Signed   By: Sandi Mariscal M.D.   On: 07/11/2014 08:21   US Aorta  07/10/2014   CLINICAL DATA:  Coronary artery disease. Peripheral vascular disease. Abdominal aortic aneurysm.  EXAM: ULTRASOUND OF ABDOMINAL AORTA  TECHNIQUE: Ultrasound examination of the abdominal aorta was performed to evaluate for abdominal aortic aneurysm.  COMPARISON:  CT lumbar spine 03/13/2014  FINDINGS: Abdominal Aorta  No aneurysm identified.  Maximum AP  Diameter:  1.9 cm  Maximum TRV  Diameter: 2 cm  Atherosclerotic calcification is noted. Iliac arteries are obscured.  IMPRESSION: No abdominal aortic aneurysm. Maximal AP diameter is 1.9 cm. Normal caliber of the abdominal aorta.   Electronically Signed   By: Lucienne Capers M.D.   On: 07/10/2014 06:06   Dg Chest [facility]  1 View  07/10/2014   CLINICAL DATA:  Shortness of breath. Diabetes. Hypertension. Coronary artery disease.  EXAM: PORTABLE CHEST - 1 VIEW  COMPARISON:  06/28/2014  FINDINGS: The heart size and mediastinal contours are within normal limits. Both lungs are clear. No evidence of pleural effusion. Prior CABG again noted.  IMPRESSION: No active disease.   Electronically Signed   By: Earle Gell M.D.   On: 07/10/2014 00:26   Dg Chest Portable 1 View  06/28/2014   CLINICAL DATA:  Three day history of chest pain. Chronic bilateral lower extremity edema. Current history of diabetes, hypertension and COPD. Current smoker. Prior CABG.  EXAM: PORTABLE CHEST - 1 VIEW  COMPARISON:  Two-view chest x-ray 10/30/2013, 10/27/2013, 02/06/2011.  FINDINGS: Prior sternotomy for CABG. Cardiac silhouette upper normal in size. Mild diffuse interstitial pulmonary edema superimposed upon baseline changes of COPD/emphysema. No confluent airspace consolidation. No visible pleural effusions.  IMPRESSION: Mild CHF and/or fluid overload, with mild diffuse interstitial pulmonary edema. COPD/emphysema.   Electronically Signed   By: Evangeline Dakin M.D.   On:  06/28/2014 17:14   Other results: EKG: Sinus rhythm with left bundle branch block.  Assessment & Plan by Problem: Hypoglycemia/uncontrolled diabetes with vascular complications Hold all patient's insulin at this time.  Continue D. 10.  Sliding scale insulin.  Wean off D10 as tolerated.  Also hold patient's oral metformin and glipizide.  Diabetic diet.  Hypothermia Due to hypoglycemia.  Improved, continue to monitor.  Wean off warming blanket as tolerated.  Left lower extremity ulcers due to peripheral vascular disease Has had extensive workup during previous hospitalization including CT angiogram which demonstrated severe iliofemoral occlusive disease and was seen by vascular surgery.  As patient had COPD exacerbation, outpatient workup was recommended.  Patient has arteriogram scheduled on 08/05/2014 by vascular surgery.  Continue patient's home medications.  Continue doxycycline, which was started during previous hospitalization, however per pharmacy patient never started taking this medication.  COPD Continue home inhalers and nebulizer therapy.  Currently not on any steroids.  Tobacco abuse Counseled on cessation.  Patient declined nicotine patch.  Currently smoking one pack per day.  GERD Continue PPI.  Hypertension Continue home antihypertensive medications.  Neuropathy Due to diabetes and peripheral vascular disease.  Continue gabapentin.  Chronic systolic congestive heart failure Last 2-D echocardiogram on 10/28/2013 showed EF 45-50%.  Patient appears compensated on exam.  Monitor volume status carefully.  Coronary artery disease Continue aspirin and beta blocker.  Stable.  Hyperlipidemia Continue statin and fenofibrate.  CODE STATUS Full code.  Prophylaxis Lovenox.  Disposition Admit the patient to step down as he is on D10.  Time spent on admission, talking to the patient, and coordinating care was: 50 mins.  Rogers Ditter A, MD 07/22/2014, 3:32 AM

## 2014-07-22 NOTE — ED Notes (Signed)
Pt placed on the bear hugger blanket for temp control. CBG 120 at this time.

## 2014-07-22 NOTE — ED Notes (Signed)
Patient transported to X-ray 

## 2014-07-22 NOTE — Progress Notes (Signed)
[facility] Care Coordination Note:   This Case Manager reviewed patient's EMR and determined patient would benefit from chronic care management services through the [facility]. Patient has a history of diabetes, hypertension, hyperlipidemia, peripheral neuropathy, coronary artery disease, congestive heart failure. This Case Manager met with patient to discuss the services and medical management that can be provided at the [facility]. Patient declined [facility] follow-up upon discharge. Provided patient with clinic contact information in case he reconsiders and wants post-discharge follow-up at the [facility].    [name]

## 2014-07-23 LAB — URINE CULTURE: Colony Count: 2000

## 2014-07-23 LAB — GLUCOSE, CAPILLARY
GLUCOSE-CAPILLARY: 183 mg/dL — AB (ref 70–99)
Glucose-Capillary: 261 mg/dL — ABNORMAL HIGH (ref 70–99)

## 2014-07-23 MED ORDER — INSULIN GLARGINE 100 UNIT/ML ~~LOC~~ SOLN: 5.0000 [IU] | Freq: Every day | SUBCUTANEOUS | Status: DC

## 2014-07-23 MED ORDER — INSULIN GLARGINE 100 UNIT/ML SOLOSTAR PEN: 5.0000 [IU] | PEN_INJECTOR | Freq: Every day | SUBCUTANEOUS | Status: DC

## 2014-07-23 MED ORDER — DOXYCYCLINE HYCLATE 100 MG PO CAPS: 100.0000 mg | ORAL_CAPSULE | Freq: Two times a day (BID) | ORAL | Status: DC

## 2014-07-23 NOTE — Discharge Summary (Signed)
Physician Discharge Summary  Stephen [name] EVO:[id number] DOB: 01-15-1941 DOA: 07/21/2014  PCP: Reynold Bowen, NT  Admit date: 07/21/2014 Discharge date: 07/23/2014  Time spent: > 35 minutes  Recommendations for Outpatient Follow-up:  1. Please be sure to check your blood sugars at least 2 times per day. Fasting and postprandial 2. Monitor blood pressures and adjust antihypertensive medication accordingly.  Discharge Diagnoses:  Please see list below   Discharge Condition: stable  Diet recommendation: diabetic/heart healthy  Filed Weights   07/22/14 0430 07/23/14 0330  Weight: 93.4 kg (205 lb 14.6 oz) 91.8 kg (202 lb 6.1 oz)    History of present illness:  From original HPI:  73 y.o. Caucasian male with history of diabetes, hypertension, hyperlipidemia, peripheral neuropathy, GERD, coronary artery disease, COPD, chronic systolic congestive heart failure, and left lower extremity ischemic ulcers due to underlying peripheral vascular disease who presented with low blood sugar   Hospital Course:  Assessment & Plan by Problem:   Hypoglycemia/uncontrolled diabetes with vascular complications  - discontinue patient's glipizide and decrease patient's Lantus to 5 units daily.  Will continue metformin as well. - hypoglycemia resolved.  Hypothermia  Due to hypoglycemia. Resolved.  Left lower extremity ulcers due to peripheral vascular disease  Has had extensive workup during previous hospitalization including CT angiogram which demonstrated severe iliofemoral occlusive disease and was seen by vascular surgery. As patient had COPD exacerbation, outpatient workup was recommended. Patient has arteriogram scheduled on 08/05/2014 by vascular surgery. Continue patient's home medications. Continue doxycycline, which was started during previous hospitalization, however per pharmacy patient never started taking this medication.  - Encouraged patient to take antibiotic medication as recommended.  Will consult care manager to see if patient can get home health nurse to assist with medications on discharge.  COPD  Continue home regimen.   Tobacco abuse  Counseled on cessation. Patient declined nicotine patch. Currently smoking one pack per day.   GERD  Continue PPI.   Hypertension  Continue home antihypertensive medications.   Neuropathy  Stable, due to diabetes and peripheral vascular disease. Continue gabapentin.   Chronic systolic congestive heart failure  Last 2-D echocardiogram on 10/28/2013 showed EF 45-50%. Patient appears compensated on exam. Monitor volume status carefully.   Coronary artery disease  Continue aspirin and beta blocker. Stable.   Hyperlipidemia  Continue statin and fenofibrate.   Procedures:  None  Consultations:  None  Discharge Exam: Filed Vitals:   07/23/14 0908  BP: 173/71  Pulse: 86  Temp:   Resp: 17    General: Pt in nad, alert and awake Cardiovascular: rrr, no mrg Respiratory: cta bl, no wheezes  Discharge Instructions You were cared for by a hospitalist during your hospital stay. If you have any questions about your discharge medications or the care you received while you were in the hospital after you are discharged, you can call the unit and asked to speak with the hospitalist on call if the hospitalist that took care of you is not available. Once you are discharged, your primary care physician will handle any further medical issues. Please note that NO REFILLS for any discharge medications will be authorized once you are discharged, as it is imperative that you return to your primary care physician (or establish a relationship with a primary care physician if you do not have one) for your aftercare needs so that they can reassess your need for medications and monitor your lab values.  Discharge Instructions   Call MD for:  difficulty breathing,  headache or visual disturbances    Complete by:  As directed      Call MD for:   redness, tenderness, or signs of infection (pain, swelling, redness, odor or green/yellow discharge around incision site)    Complete by:  As directed      Call MD for:  temperature >100.4    Complete by:  As directed      Diet - low sodium heart healthy    Complete by:  As directed      Discharge instructions    Complete by:  As directed   Please follow up with your primary care physician in 1-2 weeks or sooner should any new concerns arise.     Increase activity slowly    Complete by:  As directed           Current Discharge Medication List    CONTINUE these medications which have CHANGED   Details  Insulin Glargine (LANTUS SOLOSTAR) 100 UNIT/ML Solostar Pen Inject 5 Units into the skin daily. Qty: 15 mL, Refills: 11      CONTINUE these medications which have NOT CHANGED   Details  albuterol-ipratropium (COMBIVENT) 18-103 MCG/ACT inhaler Inhale 1-2 puffs into the lungs every 6 (six) hours as needed for wheezing or shortness of breath.    aspirin EC 325 MG tablet Take 325 mg by mouth every other day.     atorvastatin (LIPITOR) 40 MG tablet Take 40 mg by mouth daily.    fenofibrate 160 MG tablet Take 160 mg by mouth daily with breakfast.     furosemide (LASIX) 20 MG tablet Take 2 tablets (40 mg total) by mouth daily. Qty: 10 tablet, Refills: 0    gabapentin (NEURONTIN) 100 MG capsule Take 300 mg by mouth at bedtime.     HYDROcodone-acetaminophen (NORCO) 7.5-325 MG per tablet Take 1 tablet by mouth every 6 (six) hours as needed for moderate pain.    levalbuterol (XOPENEX) 1.25 MG/3ML nebulizer solution Take 0.63 mg by nebulization every 4 (four) hours as needed for wheezing or shortness of breath. Qty: 72 mL, Refills: 0    lisinopril (PRINIVIL,ZESTRIL) 20 MG tablet Take 2 tablets (40 mg total) by mouth daily. Qty: 60 tablet, Refills: 0    loratadine (CLARITIN) 10 MG tablet Take 10 mg by mouth daily.    metFORMIN (GLUCOPHAGE) 1000 MG tablet Take 1,000 mg by mouth daily  with breakfast.     metoprolol tartrate (LOPRESSOR) 25 MG tablet Take 50 mg by mouth daily.     nitroGLYCERIN (NITROSTAT) 0.4 MG SL tablet Place 0.4 mg under the tongue every 5 (five) minutes x 3 doses as needed.    Pancrelipase, Lip-Prot-Amyl, (CREON) 24000 UNITS CPEP Take 2-3 capsules by mouth See admin instructions. Take 3 capsules with all meals and 2 with snacks    pantoprazole (PROTONIX) 40 MG tablet Take 1 tablet (40 mg total) by mouth daily. Qty: 30 tablet, Refills: 1    spironolactone (ALDACTONE) 25 MG tablet Take 25 mg by mouth daily.      SYMBICORT 160-4.5 MCG/ACT inhaler Inhale 1-2 puffs into the lungs 2 (two) times daily as needed (shorntess of breath).     tiotropium (SPIRIVA HANDIHALER) 18 MCG inhalation capsule Place 1 capsule (18 mcg total) into inhaler and inhale daily. Qty: 30 capsule, Refills: 0    doxycycline (VIBRAMYCIN) 100 MG capsule Take 1 capsule (100 mg total) by mouth 2 (two) times daily. Qty: 6 capsule, Refills: 0      STOP taking these  medications     glipiZIDE (GLUCOTROL XL) 2.5 MG 24 hr tablet        Allergies  Allergen Reactions  . Levaquin [Levofloxacin] Hives      The results of significant diagnostics from this hospitalization (including imaging, microbiology, ancillary and laboratory) are listed below for reference.    Significant Diagnostic Studies: Dg Chest 2 View  07/22/2014   CLINICAL DATA:  Shortness of breath and cough.  Hypoglycemia.  EXAM: CHEST  2 VIEW  COMPARISON:  Chest radiograph performed 07/10/2014  FINDINGS: The lungs are well-aerated. Small bilateral pleural effusions are seen. Increased interstitial markings may reflect mild interstitial edema. Vascular congestion is noted. No pneumothorax is identified.  The heart is borderline normal in size. The patient is status post median sternotomy, with evidence of prior CABG. No acute osseous abnormalities are seen.  IMPRESSION: Small bilateral pleural effusions seen. Vascular  congestion noted. Increased interstitial markings may reflect mild interstitial edema.   Electronically Signed   By: Garald Balding M.D.   On: 07/22/2014 01:14   Ct Angio Ao+bifem W/cm &/or Wo/cm  07/11/2014   CLINICAL DATA:  Nonhealing ulcers within the lower extremities consistent with aortoiliac disease Evaluate for PAD.  EXAM: CT ANGIOGRAPHY OF ABDOMINAL AORTA WITH ILIOFEMORAL RUNOFF  TECHNIQUE: Multidetector CT imaging of the abdomen, pelvis and lower extremities was performed using the standard protocol during bolus administration of intravenous contrast. Multiplanar CT image reconstructions and MIPs were obtained to evaluate the vascular anatomy.  CONTRAST:  122mL OMNIPAQUE IOHEXOL 350 MG/ML SOLN  COMPARISON:  CT abdomen pelvis - 06/04/2011; chest radiograph - 07/10/2014  FINDINGS: Vascular Findings:  Abdominal aorta: There is a moderate to large amount of eccentric mixed calcified and noncalcified slightly irregular atherosclerotic plaque throughout the normal caliber abdominal aorta, not resulting in a hemodynamically significant stenosis. No abdominal aortic dissection or periaortic stranding.  Celiac artery: Widely patent without a hemodynamically significant stenosis. The left gastric artery is incidentally noted to have a separate origin to the right lateral aspect of the main celiac trunk. Otherwise, conventional branching pattern.  SMA: There is a minimal amount of eccentric mixed calcified and noncalcified atherosclerotic plaque involving the cranial aspect of the origin the SMA, not resulting in a hemodynamically significant stenosis. Additionally, there is a minimal amount of eccentric calcified plaque within in the mid aspect of the main trunk of the SMA (axial image 64, series 5, also not resulting in hemodynamically significant stenosis. Conventional branching pattern. The distal tributaries of the SMA are widely patent without discrete filling defect to suggest distal embolism.  Right Renal  artery: Solitary; there is a mixture of calcified and noncalcified atherosclerotic plaque involving the origin of the right renal artery, not resulting in a hemodynamically significant stenosis.  Left Renal artery: Solitary ; there is a very minimal amount of eccentric predominantly calcified atherosclerotic plaque involving the origin of the left renal artery, not resulting in hemodynamically significant stenosis.  IMA: Widely patent.   --------------------------------------------------------------------------------  RIGHT LOWER EXTREMITY VASCULATURE:  Right-sided pelvic vasculature: There is a moderate amount of mixed calcified and noncalcified atherosclerotic plaque throughout the right common iliac artery, not resulting in hemodynamically significant stenosis. The right internal iliac artery is heavily disease though patent and of normal caliber. There is a large amount of mixed calcified and noncalcified atherosclerotic plaque throughout the proximal aspect of the right external iliac artery resulting in tandem areas of high-grade (grade in 70% stenosis (representative images axial image 106, series 5, coronal images 90 and  93, series 10).  Right common femoral artery: There is a mixture of calcified and noncalcified atherosclerotic plaque within the right common femoral artery which results in approximately 50% luminal narrowing (image 129, series 5  Right deep femoral artery: Disease proximally though patent and supplying the majority of the blood flow to the right lower extremity.  Right superficial femoral artery: Occluded at its origin with reconstituted atretic flow through its mid aspect (representative axial image 179, series 5) with eventual occlusion again distally (axial image 234, series 5.  Right popliteal artery: There is reconstitution of the right above knee popliteal artery via deep thigh and geniculate collaterals. There is a mixture of calcified and noncalcified atherosclerotic plaque  throughout the right above knee popliteal artery which results in multiple areas of high-grade (> 70%) luminal narrowing. The right below-knee popliteal artery is widely patent.  Right lower leg: There is eccentric calcified atherosclerotic plaque involving the proximal anterior tibial and tibioperoneal trunks, however there is a preserved three-vessel runoff to the right foot. The right dorsalis pedis artery is patent to the level of the forefoot.   ---------------------------------------------------------------------------------  LEFT LOWER EXTREMITY VASCULATURE:  Left-sided pelvic vasculature: There is eccentric calcified and noncalcified atherosclerotic plaque throughout the left external iliac artery resulting in tandem long segment areas of high-grade (greater 70%) luminal narrowing (representative axial images 91 and 97, series 5, coronal image 92, series 10), though note, intraluminal evaluation is degraded secondary to mural calcification. The left internal iliac artery is heavily diseased though patent and of normal caliber. There is eccentric mixed calcified and noncalcified atherosclerotic plaque throughout the left external iliac artery resulting in tandem long segment areas of high-grade (greater 70%) luminal narrowing).  Left common femoral artery: There is a mixture of calcified and noncalcified atherosclerotic plaque within the left common femoral artery which results an approximately 50% luminal narrowing (image 130, series 5).  Left deep femoral artery: Disease status origin though patent supplying the majority the blood flow to the left leg.  Left superficial femoral artery: There is a very minimal amount of atretic flow involving the very proximal aspect of the left superficial femoral artery. The vessel as otherwise occluded throughout its course through the level of the adductor canal.  Left popliteal artery: There is reconstitution of flow of the left above knee popliteal artery at via deep  thigh an geniculate collaterals. There is eccentric mixed calcified and noncalcified atherosclerotic plaque throughout the left above knee popliteal artery resulting in tandem areas of hemodynamically significant stenosis. The left below-knee popliteal artery is widely patent without hemodynamically significant stenosis.  Left lower leg: There is eccentric atherosclerotic plaque involving the takeoff and proximal aspect of the left anterior tibial artery as well as the distal tibioperoneal trunk, however there is a preserved three-vessel runoff to the left lower leg and foot. The left-sided dorsalis pedis artery is patent to the level of the forefoot.  Review of the MIP images confirms the above findings.   --------------------------------------------------------------------------------  Nonvascular Findings:  Evaluation of the abdominal organs is limited to the arterial phase of enhancement.  Normal hepatic contour. No discrete hyperenhancing hepatic lesions. Punctate calcification within the caudal aspect of the right lobe of the liver (image 57, series 5, is unchanged in favored to the sequela of prior granulomatous infection and/or injury. No ascites.  There is symmetric enhancement of the bilateral kidneys. Note is made of an approximately 1.2 cm partially exophytic hypo attenuating (17 Hounsfield unit) cyst arising from the posterior aspect of  the inferior pole the right kidney (axial image 68, series 5). No discrete left-sided renal lesions. No definite renal stones on this postcontrast examination. No urinary obstruction. Minimal amount of grossly symmetric bilateral perinephric stranding. Normal appearance of the bilateral adrenal glands and spleen. Stable sequela of chronic calcific pancreatitis. No peripancreatic stranding or definitive pancreatic ductal dilatation on this non pancreatic protocol CT.  Scattered minimal colonic diverticulosis without evidence of diverticulitis. Moderate colonic stool  burden without evidence of obstruction. No discrete area of bowel wall thickening. Normal appearance of the appendix. No pneumoperitoneum, pneumatosis or portal venous gas.  Prostate is enlarged with mass effect upon the urinary bladder. Normal appearance of the urinary bladder given degree distention. No free fluid within the pelvic cul-de-sac. Incidentally noted bilateral moderate to large sized hydroceles.  Limited visualization of the lower thorax demonstrates small bilateral effusions with associated bibasilar heterogeneous opacities, likely atelectatic left cyst. There is minimal subsegmental atelectasis within the imaged caudal aspects of the right middle lobe and lingula. There is mild diffuse bronchial wall thickening within the imaged bilateral lower lobe bronchi.  Borderline cardiomegaly, in particular, there is enlargement of the left ventricle. Post median sternotomy with atherosclerotic plaque within the native coronary arteries.  No acute or aggressive osseous abnormalities. Mild degenerate change of the bilateral hips.  There is mild diffuse body wall anasarca.  IMPRESSION: Vascular Impression:  1. Moderate to large amount of mixed calcified and noncalcified slightly irregular atherosclerotic plaque with a normal caliber abdominal aorta, not resulting in a hemodynamically significant stenosis. 2. The major branch vessels of the abdominal aorta (including the IMA) appear patent without a hemodynamically significant stenosis. Right Lower Extremity Vascular Impression:  1. Eccentric mixed calcified and noncalcified atherosclerotic plaque results in tandem areas of high-grade (> 70%) luminal narrowing throughout the proximal aspect of the right external iliac artery. 2. Eccentric mixed calcified and noncalcified atherosclerotic plaque results in approximately 50% luminal narrowing of the right common femoral artery. 3. The right superficial femoral artery is largely occluded throughout its course with  very minimal atretic throw through its mid aspect. 4. The right above knee popliteal artery is reconstituted via deep thigh and geniculate collaterals though is heavily diseased. The right below-knee popliteal artery is widely patent. 5. Eccentric calcified plaque involving the proximal aspects of the anterior tibial artery and tibial peritoneal trunk, however there is a preserved three-vessel runoff to the right lower leg and foot. The right dorsalis pedis artery is patent to the level of the forefoot. Left Lower Extremity Vascular Impression:  1. Eccentric mixed calcified and noncalcified irregular atherosclerotic plaque results in long segment high-grade (greater 70%) luminal narrowing throughout the left common and external iliac arteries. 2. Eccentric mixed calcified and noncalcified atherosclerotic plaque results in approximately 50% luminal narrowing of the left common femoral artery. 3. Very minimal atretic flow involving the very proximal aspect of the left superficial femoral artery. The left superficial femoral artery it is otherwise occluded throughout its course through the level of the adductor canal. 4. The left above knee popliteal artery is reconstituted via deep thigh and geniculate collaterals those heavily diseased. The left below-knee popliteal artery is widely patent. 5. There are several areas of eccentric calcified atherosclerotic plaque involving the anterior tibial and tibioperoneal trunk, however there is a preserved three-vessel runoff to the left lower leg and foot. The left dorsalis pedis artery is patent to the level of the forefoot. Non vascular Impression:  1. Cardiomegaly. Post median sternotomy with atherosclerotic plaque within  the native coronary arteries. 2. Suspected pulmonary edema with small bilateral effusions and mild diffuse body wall anasarca/third-spacing. Clinical correlation is advised. 3. Bronchial wall thickening suggestive of airways disease/bronchitis. 4.  Instilling noted moderate to large size bilateral hydroceles. 5. Stable sequela of chronic calcific pancreatitis. No definitive peripancreatic stranding to suggest a superimposed acute process.   Electronically Signed   By: Sandi Mariscal M.D.   On: 07/11/2014 08:21   US Aorta  07/10/2014   CLINICAL DATA:  Coronary artery disease. Peripheral vascular disease. Abdominal aortic aneurysm.  EXAM: ULTRASOUND OF ABDOMINAL AORTA  TECHNIQUE: Ultrasound examination of the abdominal aorta was performed to evaluate for abdominal aortic aneurysm.  COMPARISON:  CT lumbar spine 03/13/2014  FINDINGS: Abdominal Aorta  No aneurysm identified.  Maximum AP  Diameter:  1.9 cm  Maximum TRV  Diameter: 2 cm  Atherosclerotic calcification is noted. Iliac arteries are obscured.  IMPRESSION: No abdominal aortic aneurysm. Maximal AP diameter is 1.9 cm. Normal caliber of the abdominal aorta.   Electronically Signed   By: Lucienne Capers M.D.   On: 07/10/2014 06:06   Dg Chest Port 1 View  07/10/2014   CLINICAL DATA:  Shortness of breath. Diabetes. Hypertension. Coronary artery disease.  EXAM: PORTABLE CHEST - 1 VIEW  COMPARISON:  06/28/2014  FINDINGS: The heart size and mediastinal contours are within normal limits. Both lungs are clear. No evidence of pleural effusion. Prior CABG again noted.  IMPRESSION: No active disease.   Electronically Signed   By: Earle Gell M.D.   On: 07/10/2014 00:26   Dg Chest Portable 1 View  06/28/2014   CLINICAL DATA:  Three day history of chest pain. Chronic bilateral lower extremity edema. Current history of diabetes, hypertension and COPD. Current smoker. Prior CABG.  EXAM: PORTABLE CHEST - 1 VIEW  COMPARISON:  Two-view chest x-ray 10/30/2013, 10/27/2013, 02/06/2011.  FINDINGS: Prior sternotomy for CABG. Cardiac silhouette upper normal in size. Mild diffuse interstitial pulmonary edema superimposed upon baseline changes of COPD/emphysema. No confluent airspace consolidation. No visible pleural effusions.   IMPRESSION: Mild CHF and/or fluid overload, with mild diffuse interstitial pulmonary edema. COPD/emphysema.   Electronically Signed   By: Evangeline Dakin M.D.   On: 06/28/2014 17:14    Microbiology: No results found for this or any previous visit (from the past 240 hour(s)).   Labs: Basic Metabolic Panel:  Recent Labs Lab 07/21/14 2359 07/22/14 0820  NA 142 140  K 4.0 4.4  CL 102 100  CO2 28 31  GLUCOSE 85 136*  BUN 12 10  CREATININE 0.84 0.87  CALCIUM 9.2 9.2   Liver Function Tests:  Recent Labs Lab 07/21/14 2359  AST 24  ALT 16  ALKPHOS 51  BILITOT <0.2*  PROT 6.5  ALBUMIN 2.5*    Recent Labs Lab 07/21/14 2359  LIPASE 7*   No results found for this basename: AMMONIA,  in the last 168 hours CBC:  Recent Labs Lab 07/21/14 2359 07/22/14 0820  WBC 16.5* 11.4*  NEUTROABS 14.6*  --   HGB 11.4* 11.2*  HCT 35.2* 34.2*  MCV 93.9 92.9  PLT 311 323   Cardiac Enzymes: No results found for this basename: CKTOTAL, CKMB, CKMBINDEX, TROPONINI,  in the last 168 hours BNP: BNP (last 3 results)  Recent Labs  06/28/14 1700 07/09/14 2348 07/21/14 2348  PROBNP 1600.0* 1466.0* 3366.0*   CBG:  Recent Labs Lab 07/22/14 0754 07/22/14 1157 07/22/14 1523 07/22/14 2130 07/23/14 0811  GLUCAP 99 251* 197* 285* 183*  Signed:  Velvet Bathe  [facility] 07/23/2014, 10:04 AM

## 2014-07-24 NOTE — Progress Notes (Signed)
Pt d/c home, pt VSS, pt verbalized understanding of d/c, pt verbalized not being compliant with meds at home, family at [facility], d/c instructions given to family, pt and family verbalized understanding,

## 2014-07-26 ENCOUNTER — Encounter: Payer: Self-pay | Admitting: Vascular Surgery

## 2014-07-27 ENCOUNTER — Ambulatory Visit (INDEPENDENT_AMBULATORY_CARE_PROVIDER_SITE_OTHER): Payer: Medicare Other | Admitting: Vascular Surgery

## 2014-07-27 ENCOUNTER — Encounter: Payer: Self-pay | Admitting: Vascular Surgery

## 2014-07-27 VITALS — BP 168/78 | HR 81 | Resp 18 | Ht 69.0 in | Wt 190.8 lb

## 2014-07-27 DIAGNOSIS — I739 Peripheral vascular disease, unspecified: Secondary | ICD-10-CM

## 2014-07-27 NOTE — Progress Notes (Signed)
[facility] [place] HISTORY AND PHYSICAL   Reason for Consult: lower extremity ischemia History of Present Illness:  Patient is a 73 y.o. year old male who presents for evaluation of lower extremity ischemia.  Pt with 10 month history of non healing ulcers left leg.  He developed very short distance pain in left calf 3 months ago.  He can only walk about 1/2 block before pain.  This improves with 10 min rest.  He has pain in the left foot that sometimes keeps him up at night.  This has also been going on for 3 months.  He also complains of swelling in the right foot which has been present for 3 months.  He had ABI performed in December 2014 which were 0.3 left and 0.5 right.  He was seen in the hospital as a consult a few weeks ago during a COPD exacerbation.  He is a long term smoker and does wish to quit.  He had cardiac cath in Dec which showed 3 patent bypass grafts.  Also history of diabetes, hypertension, hyperlipidemia, peptic ulcer disease, remote alcohol abuse all of which are currently stable.  The ulcers have not progressed since his hospitalization.  CT Angio obtained in the hospital showed severe external iliac artery occlusive disease 70% bilaterally. Common femoral artery disease 50% bilaterally, superficial femoral artery occlusions bilaterally, below-knee popliteal artery was patent with three-vessel runoff bilaterally. There was also suggestion of profunda femoris disease bilaterally.    Past Medical History   Diagnosis  Date   .  Diabetes mellitus     .  Hypertension     .  Pancreatitis     .  Neuropathy     .  Hyperlipemia     .  Peripheral vascular disease         a. 10/2013: severely abnormal ABIs, for OP evaluation.   [name]  GERD (gastroesophageal reflux disease)     .  S/P colonoscopy  July 2003       hyperplastic polyp, diverticulosis, anal papilla and internal hemorrhoids r   .  S/P endoscopy  2003       Dr. Braulio Bosch: erosive reflux esophagitis , PUD    .  PUD (peptic ulcer disease)         diagnosed via EGD by Dr. Braulio Bosch at [facility]   .  Diverticulosis     .  Hemorrhoids     .  Coronary artery disease          a. BMS to LAD 1999. b. HSRA to LAD and stent to RCA 2007. c. NSTEMI/CABG x 3 in 04/2010. d. NSTEMI 10/2013 - secondary to demand ischemia in setting of COPD exacerbation with severe underlying CAD - cath with 3/3 patent grafts, for medical therapy.    [name]  COPD (chronic obstructive pulmonary disease)         a. Ongoing tobacco abuse.   .  Arthritis     .  Neutropenia         a. Dx 10/2013 on labs - instructed to f/u PCP.   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    .  Ischemic cardiomyopathy         a. previously EF 30% in 2011. b. 10/2013: EF 45-50% by echo.   .  Alcoholism         a. Remote  alcoholism   .  Cleft palate         a.Chronic cleft palate from a traumatic injury as a child.       Past Surgical History   Procedure  Laterality  Date   .  Coronary artery bypass graft    June 2011       X3   .  Right inguinal hernia repair       .  Cholecystectomy           3-4 years ago   .  Appendectomy           age 61   .  Cataracts       .  Colonoscopy    05/12/2002       Dr. [name]- diverticulosis, anal papilla, internal hemorrhoids, rectal polyps   .  [name] dilation    04/16/2012       Schatzki's ring-status post dilation and disruption/ as described above. Grade 1 esophageal varices. Small hiatal hernia.   [name] dilation    04/16/2012       Procedure: [name] DILATION;  Surgeon: Daneil Dolin, MD;  [facility];  Service: Endoscopy;  Laterality: N/A;   .  Colonoscopy    08/10/2012       Procedure: COLONOSCOPY;  Surgeon: Daneil Dolin, MD;  Location: [facility];  Service: Endoscopy;  Laterality: N/A;  9:45 needs 30 mins extra /have Glucagon on hand     Social History History   Substance Use Topics   .  Smoking  status:  Current Every Day Smoker -- 1.50 packs/day for 63 years       Types:  Cigarettes   .  Smokeless tobacco:  Current User         Comment: on last pack now then will quit   .  Alcohol Use:  No         Comment: Quit 1985     Family History Family History   Problem  Relation  Age of Onset   .  Coronary artery disease  Mother     .  Coronary artery disease  Father     .  Diabetes  Sister     .  Suicidality  Brother     .  Cirrhosis  Brother     .  Colon cancer  Neg Hx       Allergies    Allergies   Allergen  Reactions   .  Levaquin [Levofloxacin]  Hives       Current Outpatient Prescriptions on File Prior to Visit  Medication Sig Dispense Refill  . albuterol-ipratropium (COMBIVENT) 18-103 MCG/ACT inhaler Inhale 1-2 puffs into the lungs every 6 (six) hours as needed for wheezing or shortness of breath.      [name] aspirin EC 325 MG tablet Take 325 mg by mouth every other day.       [name] atorvastatin (LIPITOR) 40 MG tablet Take 40 mg by mouth daily.      [name] doxycycline (VIBRAMYCIN) 100 MG capsule Take 1 capsule (100 mg total) by mouth 2 (two) times daily.  20 capsule  0  . fenofibrate 160 MG tablet Take 160 mg by mouth daily with breakfast.       . furosemide (LASIX) 20 MG tablet Take 2 tablets (40 mg total) by mouth daily.  10 tablet  0  . gabapentin (NEURONTIN) 100 MG capsule Take 300 mg by mouth at bedtime.       [name]  HYDROcodone-acetaminophen (NORCO) 7.5-325 MG per tablet Take 1 tablet by mouth every 6 (six) hours as needed for moderate pain.      [name] insulin glargine (LANTUS) 100 UNIT/ML injection Inject 0.05 mLs (5 Units total) into the skin daily.  10 mL  0  . levalbuterol (XOPENEX) 1.25 MG/3ML nebulizer solution Take 0.63 mg by nebulization every 4 (four) hours as needed for wheezing or shortness of breath.  72 mL  0  . lisinopril (PRINIVIL,ZESTRIL) 20 MG tablet Take 2 tablets (40 mg total) by mouth daily.  60 tablet  0  . loratadine (CLARITIN) 10 MG tablet Take 10 mg by mouth  daily.      . metFORMIN (GLUCOPHAGE) 1000 MG tablet Take 1,000 mg by mouth daily with breakfast.       . metoprolol tartrate (LOPRESSOR) 25 MG tablet Take 50 mg by mouth daily.       . nitroGLYCERIN (NITROSTAT) 0.4 MG SL tablet Place 0.4 mg under the tongue every 5 (five) minutes x 3 doses as needed.      . Pancrelipase, Lip-Prot-Amyl, (CREON) 24000 UNITS CPEP Take 2-3 capsules by mouth See admin instructions. Take 3 capsules with all meals and 2 with snacks      . pantoprazole (PROTONIX) 40 MG tablet Take 1 tablet (40 mg total) by mouth daily.  30 tablet  1  . spironolactone (ALDACTONE) 25 MG tablet Take 25 mg by mouth daily.        . SYMBICORT 160-4.5 MCG/ACT inhaler Inhale 1-2 puffs into the lungs 2 (two) times daily as needed (shorntess of breath).       . tiotropium (SPIRIVA HANDIHALER) 18 MCG inhalation capsule Place 1 capsule (18 mcg total) into inhaler and inhale daily.  30 capsule  0   No current facility-administered medications on file prior to visit.    ROS:    General:  No weight loss, Fever, chills  HEENT: No recent headaches, no nasal bleeding, no visual changes, no sore throat  Neurologic: No dizziness, blackouts, seizures. No recent symptoms of stroke or mini- stroke. No recent episodes of slurred speech, or temporary blindness.  Cardiac: No recent episodes of chest pain/pressure, no shortness of breath at rest.  + shortness of breath with exertion.  Denies history of atrial fibrillation or irregular heartbeat  Vascular: + history of rest pain in feet.  + history of claudication.  + history of non-healing ulcer, No history of DVT    Pulmonary: No home oxygen, + productive cough, no hemoptysis,  + asthma or wheezing  Musculoskeletal:  [x ] Arthritis, [x ] Low back pain,  [ ]  Joint pain  Hematologic:No history of hypercoagulable state.  No history of easy bleeding.  No history of anemia + history of neutropenia  Gastrointestinal: No hematochezia or melena,  +  gastroesophageal reflux, no trouble swallowing  Urinary: [ ]  chronic Kidney disease, [ ]  on HD - [ ]  MWF or [ ]  TTHS, [ ]  Burning with urination, [ ]  Frequent urination, [ ]  Difficulty urinating;    Skin: No rashes  Psychological: No history of anxiety,  No history of depression   Physical Examination    Filed Vitals:   07/27/14 1201  BP: 168/78  Pulse: 81  Resp: 18  Height: 5\' 9"  (1.753 m)  Weight: 190 lb 12.8 oz (86.546 kg)   General:  Alert and oriented, no acute distress HEENT: Normal Neck: No JVD Pulmonary: Coarse bilaterally Cardiac: Regular Rate and Rhythm   Abdomen: Soft, non-tender,  non-distended, no mass Skin: No rash, scattered punched out ulcer 5-6 left lower leg and medial left thigh Extremity Pulses:  2+ radial, brachial,absent femoral, dorsalis pedis, posterior tibial pulses bilaterally Musculoskeletal: No deformity right foot trace ankle edema  Neurologic: Upper and lower extremity motor 5/5 and symmetric  ASSESSMENT:  Severe chronic PAD, 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 CTA abdomen and pelvis with runoff to assess inflow.  He has severe COPD and his pulmonary function would have to be significantly improved to consider any intervention currently.  Will follow up with him post CT.  No reason to keep NPO from my standpoint.   Will cancel arterial duplex since CT will give [place] this info.  2. Needs right leg DVT ultrasound  Ruta Hinds, MD [facility] [place] Office: [phone] Pager: [phone]  [facility] HISTORY AND PHYSICAL   Requesting: Dr Olevia Bowens Reason for Consult: lower extremity ischemia History of Present Illness:  Patient is a 73 y.o. year old male who presents for evaluation of lower extremity ischemia.  Pt with 9 month history of non healing ulcers left leg.  He developed very short distance pain in left calf 3 months ago.  He can only walk about 1/2 block before  pain.  This improves with 10 min rest.  He has pain in the left foot that sometimes keeps him up at night.  This has also been going on for 3 months.  He also complains of swelling in the right foot which has been present for 3 months.  He had ABI performed in December 2014 which were 0.3 left and 0.5 right.  He was admitted yesterday with COPD exacerbation.  He is a long term smoker and does wish to quit.  He had cardiac cath in Dec which showed 3 patent bypass grafts.  Also history of diabetes, hypertension, hyperlipidemia, peptic ulcer disease, remote alcohol abuse all of which are currently stable.      Past Medical History   Diagnosis  Date   .  Diabetes mellitus     .  Hypertension     .  Pancreatitis     .  Neuropathy     .  Hyperlipemia     .  Peripheral vascular disease         a. 10/2013: severely abnormal ABIs, for OP evaluation.   [name]  GERD (gastroesophageal reflux disease)     .  S/P colonoscopy  July 2003       hyperplastic polyp, diverticulosis, anal papilla and internal hemorrhoids r   .  S/P endoscopy  2003       Dr. Braulio Bosch: erosive reflux esophagitis , PUD   .  PUD (peptic ulcer disease)         diagnosed via EGD by Dr. Braulio Bosch at [place]   .  Diverticulosis     .  Hemorrhoids     .  Coronary artery disease          a. BMS to LAD 1999. b. HSRA to LAD and stent to RCA 2007. c. NSTEMI/CABG x 3 in 04/2010. d. NSTEMI 10/2013 - secondary to demand ischemia in setting of COPD exacerbation with severe underlying CAD - cath with 3/3 patent grafts, for medical therapy.    [name]  COPD (chronic obstructive pulmonary disease)         a. Ongoing tobacco abuse.   .  Arthritis     .  Neutropenia  a. Dx 10/2013 on labs - instructed to f/u PCP.   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    .  Ischemic cardiomyopathy         a. previously EF 30% in 2011. b. 10/2013: EF 45-50% by echo.   .   Alcoholism         a. Remote alcoholism   .  Cleft palate         a.Chronic cleft palate from a traumatic injury as a child.       Past Surgical History   Procedure  Laterality  Date   .  Coronary artery bypass graft    June 2011       X3   .  Right inguinal hernia repair       .  Cholecystectomy           3-4 years ago   .  Appendectomy           age 50   .  Cataracts       .  Colonoscopy    05/12/2002       Dr. [name]- diverticulosis, anal papilla, internal hemorrhoids, rectal polyps   .  [name] dilation    04/16/2012       Schatzki's ring-status post dilation and disruption/ as described above. Grade 1 esophageal varices. Small hiatal hernia.   [name] dilation    04/16/2012       Procedure: [name] DILATION;  Surgeon: Daneil Dolin, MD;  [facility];  Service: Endoscopy;  Laterality: N/A;   .  Colonoscopy    08/10/2012       Procedure: COLONOSCOPY;  Surgeon: Daneil Dolin, MD;  [facility];  Service: Endoscopy;  Laterality: N/A;  9:45 needs 30 mins extra /have Glucagon on hand     Social History History   Substance Use Topics   .  Smoking status:  Current Every Day Smoker -- 1.50 packs/day for 63 years       Types:  Cigarettes   .  Smokeless tobacco:  Current User         Comment: on last pack now then will quit   .  Alcohol Use:  No         Comment: Quit 1985     Family History Family History   Problem  Relation  Age of Onset   .  Coronary artery disease  Mother     .  Coronary artery disease  Father     .  Diabetes  Sister     .  Suicidality  Brother     .  Cirrhosis  Brother     .  Colon cancer  Neg Hx       Allergies    Allergies   Allergen  Reactions   .  Levaquin [Levofloxacin]  Hives        Current Facility-Administered Medications   Medication  Dose  Route  Frequency  Provider  Last Rate  Last Dose   .  0.9 %  sodium chloride infusion     Intravenous  Continuous  Etta Quill, DO  50 mL/hr at 07/10/14 0636      .   antiseptic oral rinse (CPC / CETYLPYRIDINIUM CHLORIDE 0.05%) solution 7 mL   7 mL  Mouth Rinse  BID  Toy Care  Gardner, DO     7 mL at 07/10/14 1045   .  aspirin EC tablet 325 mg   325 mg  Oral  q morning - 10a  Etta Quill, DO     325 mg at 07/10/14 1030   .  atorvastatin (LIPITOR) tablet 40 mg   40 mg  Oral  Daily  Etta Quill, DO     40 mg at 07/10/14 1030   .  budesonide-formoterol (SYMBICORT) 160-4.5 MCG/ACT inhaler 2 puff   2 puff  Inhalation  BID  Etta Quill, DO     2 puff at 07/10/14 1010   .  clopidogrel (PLAVIX) tablet 75 mg   75 mg  Oral  Daily  Etta Quill, DO     75 mg at 07/10/14 1030   .  fenofibrate tablet 160 mg   160 mg  Oral  Q breakfast  Etta Quill, DO     160 mg at 07/10/14 1031   .  [START ON 07/11/2014] furosemide (LASIX) tablet 40 mg   40 mg  Oral  Daily  Charlynne Cousins, MD         .  gabapentin (NEURONTIN) capsule 300 mg   300 mg  Oral  QHS  Etta Quill, DO         .  heparin injection 5,000 Units   5,000 Units  Subcutaneous  3 times per day  Etta Quill, DO     5,000 Units at 07/10/14 1505   .  insulin aspart (novoLOG) injection 0-9 Units   0-9 Units  Subcutaneous  6 times per day  Etta Quill, DO     2 Units at 07/10/14 1148   .  insulin glargine (LANTUS) injection 15 Units   15 Units  Subcutaneous  BID  Charlynne Cousins, MD         .  levalbuterol [facility]) nebulizer solution 0.63 mg   0.63 mg  Nebulization  Q6H  Etta Quill, DO     0.63 mg at 07/10/14 1451   .  levalbuterol (XOPENEX) nebulizer solution 0.63 mg   0.63 mg  Nebulization  Q3H PRN  Etta Quill, DO         .  lipase/protease/amylase (CREON) capsule 72,000 Units   72,000 Units  Oral  TID WC & HS  Charlynne Cousins, MD         .  lisinopril (PRINIVIL,ZESTRIL) tablet 20 mg   20 mg  Oral  Daily  Etta Quill, DO     20 mg at 07/10/14 1030   .  loratadine (CLARITIN) tablet 10 mg   10 mg  Oral  Daily  Etta Quill, DO     10 mg at 07/10/14 1030   .   methylPREDNISolone sodium succinate (SOLU-MEDROL) 125 mg/2 mL injection 60 mg   60 mg  Intravenous  Q12H  Etta Quill, DO     60 mg at 07/10/14 1031   .  metoprolol tartrate (LOPRESSOR) tablet 25 mg   25 mg  Oral  BID  Etta Quill, DO     25 mg at 07/10/14 1030   .  nitroGLYCERIN (NITROSTAT) SL tablet 0.4 mg   0.4 mg  Sublingual  Q5 Min x 3 PRN  Etta Quill, DO         .  pantoprazole (PROTONIX) EC tablet 40 mg   40 mg  Oral  Daily  Etta Quill, DO     40 mg at 07/10/14 1029   .  sodium chloride 0.9 % injection 3 mL   3 mL  Intravenous  Q12H  Etta Quill, DO     3 mL at 07/10/14 1032     ROS:    General:  No weight loss, Fever, chills  HEENT: No recent headaches, no nasal bleeding, no visual changes, no sore throat  Neurologic: No dizziness, blackouts, seizures. No recent symptoms of stroke or mini- stroke. No recent episodes of slurred speech, or temporary blindness.  Cardiac: No recent episodes of chest pain/pressure, no shortness of breath at rest.  + shortness of breath with exertion.  Denies history of atrial fibrillation or irregular heartbeat  Vascular: + history of rest pain in feet.  + history of claudication.  + history of non-healing ulcer, No history of DVT    Pulmonary: No home oxygen, + productive cough, no hemoptysis,  + asthma or wheezing  Musculoskeletal:  [x ] Arthritis, [x ] Low back pain,  [ ]  Joint pain  Hematologic:No history of hypercoagulable state.  No history of easy bleeding.  No history of anemia + history of neutropenia  Gastrointestinal: No hematochezia or melena,  + gastroesophageal reflux, no trouble swallowing  Urinary: [ ]  chronic Kidney disease, [ ]  on HD - [ ]  MWF or [ ]  TTHS, [ ]  Burning with urination, [ ]  Frequent urination, [ ]  Difficulty urinating;    Skin: No rashes  Psychological: No history of anxiety,  No history of depression   Physical Examination     Filed Vitals:   07/27/14 1201  BP: 168/78  Pulse: 81  Resp:  18  Height: 5\' 9"  (1.753 m)  Weight: 190 lb 12.8 oz (86.546 kg)   General:  Alert and oriented, no acute distress HEENT: Normal Neck: No JVD Pulmonary: Coarse bilaterally, frequent coughing during my visit with him Cardiac: Regular Rate and Rhythm   Abdomen: Soft, non-tender, non-distended, no mass Skin: No rash, scattered punched out ulcer 5-6 left lower leg and medial left thigh Extremity Pulses:  2+ radial, brachial,absent femoral, dorsalis pedis, posterior tibial pulses bilaterally Musculoskeletal: No deformity right foot and ankle edema  Neurologic: Upper and lower extremity motor 5/5 and symmetric     ASSESSMENT:  Severe chronic PAD multilevel disease primarily external iliac femoral artery disease   PLAN:  Aortogram with bilateral lower extremity runoff possible intervention on October 9.  risks benefits possible complications and procedure details were explained to the patient and his son today. The patient also was scheduled for an appointment with the [facility] financial department as she is having some financial hardship. If his peripheral arterial disease not amenable to percutaneous intervention. He will probably need further pulmonary and cardiac risk stratification prior to considering any operation.  Greater than 3 minutes today spent regarding smoking cessation counseling.  Ruta Hinds, MD [facility] [place] Office: [phone] Pager: [phone]

## 2014-07-28 ENCOUNTER — Institutional Professional Consult (permissible substitution): Payer: Medicare Other | Admitting: Internal Medicine

## 2014-07-28 LAB — CULTURE, BLOOD (ROUTINE X 2)
CULTURE: NO GROWTH
Culture: NO GROWTH

## 2014-08-08 ENCOUNTER — Ambulatory Visit (INDEPENDENT_AMBULATORY_CARE_PROVIDER_SITE_OTHER): Payer: Medicare Other | Admitting: Internal Medicine

## 2014-08-08 ENCOUNTER — Encounter (INDEPENDENT_AMBULATORY_CARE_PROVIDER_SITE_OTHER): Payer: Self-pay

## 2014-08-08 ENCOUNTER — Encounter: Payer: Self-pay | Admitting: Internal Medicine

## 2014-08-08 VITALS — BP 160/74 | HR 90 | Temp 97.7°F | Ht 69.0 in | Wt 198.0 lb

## 2014-08-08 DIAGNOSIS — I739 Peripheral vascular disease, unspecified: Secondary | ICD-10-CM

## 2014-08-08 DIAGNOSIS — Z23 Encounter for immunization: Secondary | ICD-10-CM

## 2014-08-08 DIAGNOSIS — I10 Essential (primary) hypertension: Secondary | ICD-10-CM

## 2014-08-08 DIAGNOSIS — F17210 Nicotine dependence, cigarettes, uncomplicated: Secondary | ICD-10-CM

## 2014-08-08 DIAGNOSIS — J439 Emphysema, unspecified: Secondary | ICD-10-CM

## 2014-08-08 DIAGNOSIS — Z720 Tobacco use: Secondary | ICD-10-CM

## 2014-08-08 DIAGNOSIS — J449 Chronic obstructive pulmonary disease, unspecified: Secondary | ICD-10-CM

## 2014-08-08 MED ORDER — OLMESARTAN MEDOXOMIL 20 MG PO TABS: 20.0000 mg | ORAL_TABLET | Freq: Every day | ORAL | Status: DC

## 2014-08-08 NOTE — Progress Notes (Signed)
Subjective:     Patient ID: Stephen [name], male   DOB: 1940-11-05, 73 y.o.   MRN: [id number]  HPI  20 yowm smoker with chronic cough since childhood referred by Dr Doreatha Martin for pulmonary clearance 08/08/2014 for vasc surgery L leg.   08/08/2014 1st [facility] office visit/ [facility]  / on symbicort/ spiriva/ multiple saba / ACEi  Chief Complaint  Patient presents with  . Pulmonary Consult    Referred by Dr. Sloan Leiter. Pt states that he needs pulmopnary clearance for leg surgery. Pt denies any respiratory co's today.   baseline able to walk as of Spring 2015 and push mower x 16m but since then slowed down by L leg pain and sleeps all night but wakes up cough/ congestion x 15 min > brown, no recent change    No obvious day to day or daytime variabilty or assoc  cp or chest tightness, subjective wheeze overt sinus or hb symptoms. No unusual exp hx or h/o childhood pna/ asthma or knowledge of premature birth.  Sleeping ok without nocturnal  or early am exacerbation  of respiratory  c/o's or need for noct saba. Also denies any obvious fluctuation of symptoms with weather or environmental changes or other aggravating or alleviating factors except as outlined above   Current Medications, Allergies, Complete Past Medical History, Past Surgical History, Family History, and Social History were reviewed in [employer] record.  ROS  The following are not active complaints unless bolded sore throat, dysphagia, dental problems, itching, sneezing,  nasal congestion or excess/ purulent secretions, ear ache,   fever, chills, sweats, unintended wt loss, pleuritic or exertional cp, hemoptysis,  orthopnea pnd or leg swelling, presyncope, palpitations, heartburn, abdominal pain, anorexia, nausea, vomiting, diarrhea  or change in bowel or urinary habits, change in stools or urine, dysuria,hematuria,  rash, arthralgias, visual complaints, headache, numbness weakness or ataxia or problems with  walking or coordination,  change in mood/affect or memory.                    Review of Systems     Objective:   Physical Exam amb wm with gruff voice and congested sounding cough  Wt Readings from Last 3 Encounters:  08/08/14 198 lb (89.812 kg)  07/27/14 190 lb 12.8 oz (86.546 kg)  07/23/14 202 lb 6.1 oz (91.8 kg)      HEENT mild turbinate edema.  Oropharynx no thrush or excess pnd or cobblestoning.  No JVD or cervical adenopathy. Mild accessory muscle hypertrophy. Trachea midline, nl thryroid. Chest was hyperinflated by percussion with diminished breath sounds and moderate increased exp time without wheeze. Hoover sign positive at mid inspiration. Regular rate and rhythm without murmur gallop or rub or increase P2 or edema.  Abd: no hsm, nl excursion. Ext warm without cyanosis or clubbing.     CXR 07/22/14  Small bilateral pleural effusions seen. Vascular congestion noted.  Increased interstitial markings may reflect mild interstitial edema.     Assessment:

## 2014-08-08 NOTE — Patient Instructions (Addendum)
Stop spiriva   Work on inhaler technique:  relax and gently blow all the way out then take a nice smooth deep breath back in, triggering the inhaler at same time you start breathing in.  Hold for up to 5 seconds if you can.  Rinse and gargle with water when done  No smoking between now and surgery   Stop lisinopril and start benicar 20 mg daily   Please schedule a follow up office visit in 4 weeks, sooner if needed

## 2014-08-10 DIAGNOSIS — F17210 Nicotine dependence, cigarettes, uncomplicated: Secondary | ICD-10-CM | POA: Insufficient documentation

## 2014-08-10 DIAGNOSIS — I10 Essential (primary) hypertension: Secondary | ICD-10-CM | POA: Insufficient documentation

## 2014-08-10 NOTE — Assessment & Plan Note (Signed)
There is no cut off FEV1 for the surgery planned and although he has am cough/ congestion that will be more problematic supine/ sedated I believe he would be a reasonable surgical risk though certainly needs to make every effort to stop smoking preop and maintain abstinence post op

## 2014-08-10 NOTE — Assessment & Plan Note (Signed)

## 2014-08-10 NOTE — Assessment & Plan Note (Signed)

## 2014-08-10 NOTE — Assessment & Plan Note (Addendum)
Spirometry 08/08/14  FEV1  1.68 (50%) ratio 62   DDX of  difficult airways management all start with A and  include Adherence, Ace Inhibitors, Acid Reflux, Active Sinus Disease, Alpha 1 Antitripsin deficiency, Anxiety masquerading as Airways dz,  ABPA,  allergy(esp in young), Aspiration (esp in elderly), Adverse effects of DPI,  Active smokers, plus two Bs  = Bronchiectasis and Beta blocker use..and one C= CHF  Adherence is always the initial "prime suspect" and is a multilayered concern that requires a "trust but verify" approach in every patient - starting with knowing how to use medications, especially inhalers, correctly, keeping up with refills and understanding the fundamental difference between maintenance and prns vs those medications only taken for a very short course and then stopped and not refilled.  The proper method of use, as well as anticipated side effects, of a metered-dose inhaler are discussed and demonstrated to the patient. Improved effectiveness after extensive coaching during this visit to a level of approximately  75% needs work   Active smoking a bid concern > discussed separately  ACEi problematic here > see HBP  Adverse effects of dpi > stop spiriva as not using it correctly anyway  See instructions for specific recommendations which were reviewed directly with the patient who was given a copy with highlighter outlining the key components.

## 2014-08-12 ENCOUNTER — Other Ambulatory Visit: Payer: Self-pay

## 2014-08-12 ENCOUNTER — Encounter (HOSPITAL_COMMUNITY): Payer: Self-pay | Admitting: Pharmacy Technician

## 2014-08-16 ENCOUNTER — Emergency Department (HOSPITAL_COMMUNITY): Payer: Medicare Other

## 2014-08-16 ENCOUNTER — Encounter (HOSPITAL_COMMUNITY): Payer: Self-pay | Admitting: Emergency Medicine

## 2014-08-16 ENCOUNTER — Inpatient Hospital Stay (HOSPITAL_COMMUNITY)
Admission: EM | Admit: 2014-08-16 | Discharge: 2014-08-19 | DRG: 291 | Disposition: A | Discharge status: Home Health | Payer: Medicare Other | Attending: Family Medicine | Admitting: Family Medicine

## 2014-08-16 ENCOUNTER — Other Ambulatory Visit: Payer: Self-pay

## 2014-08-16 DIAGNOSIS — I5020 Unspecified systolic (congestive) heart failure: Secondary | ICD-10-CM

## 2014-08-16 DIAGNOSIS — I255 Ischemic cardiomyopathy: Secondary | ICD-10-CM

## 2014-08-16 DIAGNOSIS — I252 Old myocardial infarction: Secondary | ICD-10-CM | POA: Diagnosis not present

## 2014-08-16 DIAGNOSIS — I10 Essential (primary) hypertension: Secondary | ICD-10-CM

## 2014-08-16 DIAGNOSIS — J9601 Acute respiratory failure with hypoxia: Secondary | ICD-10-CM | POA: Diagnosis present

## 2014-08-16 DIAGNOSIS — I447 Left bundle-branch block, unspecified: Secondary | ICD-10-CM

## 2014-08-16 DIAGNOSIS — I739 Peripheral vascular disease, unspecified: Secondary | ICD-10-CM | POA: Diagnosis present

## 2014-08-16 DIAGNOSIS — E785 Hyperlipidemia, unspecified: Secondary | ICD-10-CM | POA: Diagnosis present

## 2014-08-16 DIAGNOSIS — Z794 Long term (current) use of insulin: Secondary | ICD-10-CM | POA: Diagnosis not present

## 2014-08-16 DIAGNOSIS — I70219 Atherosclerosis of native arteries of extremities with intermittent claudication, unspecified extremity: Secondary | ICD-10-CM

## 2014-08-16 DIAGNOSIS — I5023 Acute on chronic systolic (congestive) heart failure: Secondary | ICD-10-CM | POA: Diagnosis present

## 2014-08-16 DIAGNOSIS — L97929 Non-pressure chronic ulcer of unspecified part of left lower leg with unspecified severity: Secondary | ICD-10-CM | POA: Diagnosis present

## 2014-08-16 DIAGNOSIS — Z833 Family history of diabetes mellitus: Secondary | ICD-10-CM

## 2014-08-16 DIAGNOSIS — K219 Gastro-esophageal reflux disease without esophagitis: Secondary | ICD-10-CM | POA: Diagnosis present

## 2014-08-16 DIAGNOSIS — Z8249 Family history of ischemic heart disease and other diseases of the circulatory system: Secondary | ICD-10-CM | POA: Diagnosis not present

## 2014-08-16 DIAGNOSIS — E11649 Type 2 diabetes mellitus with hypoglycemia without coma: Secondary | ICD-10-CM | POA: Diagnosis present

## 2014-08-16 DIAGNOSIS — E1122 Type 2 diabetes mellitus with diabetic chronic kidney disease: Secondary | ICD-10-CM

## 2014-08-16 DIAGNOSIS — M1990 Unspecified osteoarthritis, unspecified site: Secondary | ICD-10-CM | POA: Diagnosis present

## 2014-08-16 DIAGNOSIS — R778 Other specified abnormalities of plasma proteins: Secondary | ICD-10-CM

## 2014-08-16 DIAGNOSIS — J449 Chronic obstructive pulmonary disease, unspecified: Secondary | ICD-10-CM | POA: Diagnosis present

## 2014-08-16 DIAGNOSIS — Z79899 Other long term (current) drug therapy: Secondary | ICD-10-CM

## 2014-08-16 DIAGNOSIS — R7989 Other specified abnormal findings of blood chemistry: Secondary | ICD-10-CM

## 2014-08-16 DIAGNOSIS — I2510 Atherosclerotic heart disease of native coronary artery without angina pectoris: Secondary | ICD-10-CM | POA: Diagnosis present

## 2014-08-16 DIAGNOSIS — I248 Other forms of acute ischemic heart disease: Secondary | ICD-10-CM

## 2014-08-16 DIAGNOSIS — E138 Other specified diabetes mellitus with unspecified complications: Secondary | ICD-10-CM

## 2014-08-16 DIAGNOSIS — Z7982 Long term (current) use of aspirin: Secondary | ICD-10-CM

## 2014-08-16 DIAGNOSIS — F17210 Nicotine dependence, cigarettes, uncomplicated: Secondary | ICD-10-CM

## 2014-08-16 DIAGNOSIS — Z716 Tobacco abuse counseling: Secondary | ICD-10-CM

## 2014-08-16 DIAGNOSIS — I25810 Atherosclerosis of coronary artery bypass graft(s) without angina pectoris: Secondary | ICD-10-CM

## 2014-08-16 DIAGNOSIS — Z7902 Long term (current) use of antithrombotics/antiplatelets: Secondary | ICD-10-CM | POA: Diagnosis not present

## 2014-08-16 DIAGNOSIS — Z951 Presence of aortocoronary bypass graft: Secondary | ICD-10-CM | POA: Diagnosis not present

## 2014-08-16 DIAGNOSIS — I714 Abdominal aortic aneurysm, without rupture, unspecified: Secondary | ICD-10-CM

## 2014-08-16 DIAGNOSIS — R0600 Dyspnea, unspecified: Secondary | ICD-10-CM | POA: Diagnosis not present

## 2014-08-16 DIAGNOSIS — F1011 Alcohol abuse, in remission: Secondary | ICD-10-CM

## 2014-08-16 DIAGNOSIS — J438 Other emphysema: Secondary | ICD-10-CM

## 2014-08-16 DIAGNOSIS — I509 Heart failure, unspecified: Secondary | ICD-10-CM

## 2014-08-16 DIAGNOSIS — I2489 Other forms of acute ischemic heart disease: Secondary | ICD-10-CM

## 2014-08-16 DIAGNOSIS — F1010 Alcohol abuse, uncomplicated: Secondary | ICD-10-CM

## 2014-08-16 DIAGNOSIS — I259 Chronic ischemic heart disease, unspecified: Secondary | ICD-10-CM

## 2014-08-16 LAB — PRO B NATRIURETIC PEPTIDE: PRO B NATRI PEPTIDE: 6508 pg/mL — AB (ref 0–125)

## 2014-08-16 LAB — COMPREHENSIVE METABOLIC PANEL
ALBUMIN: 2.3 g/dL — AB (ref 3.5–5.2)
ALT: 13 U/L (ref 0–53)
ANION GAP: 9 (ref 5–15)
AST: 21 U/L (ref 0–37)
Alkaline Phosphatase: 66 U/L (ref 39–117)
BUN: 17 mg/dL (ref 6–23)
CO2: 32 mEq/L (ref 19–32)
CREATININE: 1.19 mg/dL (ref 0.50–1.35)
Calcium: 9.1 mg/dL (ref 8.4–10.5)
Chloride: 95 mEq/L — ABNORMAL LOW (ref 96–112)
GFR calc Af Amer: 69 mL/min — ABNORMAL LOW (ref >90)
GFR calc non Af Amer: 60 mL/min — ABNORMAL LOW (ref >90)
Glucose, Bld: 435 mg/dL — ABNORMAL HIGH (ref 70–99)
Potassium: 5.4 mEq/L — ABNORMAL HIGH (ref 3.7–5.3)
Sodium: 136 mEq/L — ABNORMAL LOW (ref 137–147)
TOTAL PROTEIN: 6.4 g/dL (ref 6.0–8.3)
Total Bilirubin: <0.2 mg/dL — ABNORMAL LOW (ref 0.3–1.2)

## 2014-08-16 LAB — GLUCOSE, CAPILLARY: Glucose-Capillary: 431 mg/dL — ABNORMAL HIGH (ref 70–99)

## 2014-08-16 LAB — POTASSIUM: POTASSIUM: 5.2 meq/L (ref 3.7–5.3)

## 2014-08-16 LAB — TROPONIN I
TROPONIN I: 0.47 ng/mL — AB (ref <0.30)
TROPONIN I: 0.43 ng/mL — AB (ref <0.30)
TROPONIN I: 0.45 ng/mL — AB (ref <0.30)

## 2014-08-16 LAB — CBC WITH DIFFERENTIAL/PLATELET
BASOS PCT: 0 % (ref 0–1)
Basophils Absolute: 0 10*3/uL (ref 0.0–0.1)
EOS ABS: 0 10*3/uL (ref 0.0–0.7)
Eosinophils Relative: 0 % (ref 0–5)
HEMATOCRIT: 34 % — AB (ref 39.0–52.0)
Hemoglobin: 10.8 g/dL — ABNORMAL LOW (ref 13.0–17.0)
Lymphocytes Relative: 13 % (ref 12–46)
Lymphs Abs: 1.1 10*3/uL (ref 0.7–4.0)
MCH: 29.3 pg (ref 26.0–34.0)
MCHC: 31.8 g/dL (ref 30.0–36.0)
MCV: 92.4 fL (ref 78.0–100.0)
MONO ABS: 0.5 10*3/uL (ref 0.1–1.0)
Monocytes Relative: 6 % (ref 3–12)
Neutro Abs: 6.6 10*3/uL (ref 1.7–7.7)
Neutrophils Relative %: 81 % — ABNORMAL HIGH (ref 43–77)
Platelets: 328 10*3/uL (ref 150–400)
RBC: 3.68 MIL/uL — ABNORMAL LOW (ref 4.22–5.81)
RDW: 14.9 % (ref 11.5–15.5)
WBC: 8.2 10*3/uL (ref 4.0–10.5)

## 2014-08-16 LAB — GLUCOSE, RANDOM: Glucose, Bld: 494 mg/dL — ABNORMAL HIGH (ref 70–99)

## 2014-08-16 MED ORDER — NITROGLYCERIN 0.4 MG SL SUBL: 0.4000 mg | SUBLINGUAL_TABLET | SUBLINGUAL | Status: DC | PRN

## 2014-08-16 MED ORDER — SPIRONOLACTONE 25 MG PO TABS
25.0000 mg | ORAL_TABLET | Freq: Every day | ORAL | Status: DC
  Administered 2014-08-17 – 2014-08-19 (×3): 25 mg via ORAL
  Filled 2014-08-16 (×3): qty 1

## 2014-08-16 MED ORDER — ATORVASTATIN CALCIUM 40 MG PO TABS
40.0000 mg | ORAL_TABLET | Freq: Every day | ORAL | Status: DC
  Administered 2014-08-16 – 2014-08-18 (×3): 40 mg via ORAL
  Filled 2014-08-16 (×3): qty 1

## 2014-08-16 MED ORDER — TIOTROPIUM BROMIDE MONOHYDRATE 18 MCG IN CAPS
18.0000 ug | ORAL_CAPSULE | Freq: Every day | RESPIRATORY_TRACT | Status: DC
  Filled 2014-08-16: qty 5

## 2014-08-16 MED ORDER — METFORMIN HCL 500 MG PO TABS
1000.0000 mg | ORAL_TABLET | Freq: Two times a day (BID) | ORAL | Status: DC
  Administered 2014-08-17: 1000 mg via ORAL
  Filled 2014-08-16: qty 2

## 2014-08-16 MED ORDER — HEPARIN SODIUM (PORCINE) 5000 UNIT/ML IJ SOLN
5000.0000 [IU] | Freq: Three times a day (TID) | INTRAMUSCULAR | Status: DC
  Administered 2014-08-16 – 2014-08-19 (×9): 5000 [IU] via SUBCUTANEOUS
  Filled 2014-08-16 (×9): qty 1

## 2014-08-16 MED ORDER — HYDROCODONE-ACETAMINOPHEN 7.5-325 MG PO TABS
1.0000 | ORAL_TABLET | Freq: Three times a day (TID) | ORAL | Status: DC | PRN
  Administered 2014-08-16 – 2014-08-19 (×8): 1 via ORAL
  Filled 2014-08-16 (×8): qty 1

## 2014-08-16 MED ORDER — GABAPENTIN 300 MG PO CAPS
300.0000 mg | ORAL_CAPSULE | Freq: Every day | ORAL | Status: DC
  Administered 2014-08-16 – 2014-08-18 (×3): 300 mg via ORAL
  Filled 2014-08-16 (×3): qty 1

## 2014-08-16 MED ORDER — ONDANSETRON HCL 4 MG/2ML IJ SOLN: 4.0000 mg | Freq: Four times a day (QID) | INTRAMUSCULAR | Status: DC | PRN

## 2014-08-16 MED ORDER — INSULIN ASPART 100 UNIT/ML ~~LOC~~ SOLN
0.0000 [IU] | Freq: Three times a day (TID) | SUBCUTANEOUS | Status: DC
  Administered 2014-08-17: 7 [IU] via SUBCUTANEOUS
  Administered 2014-08-17: 4 [IU] via SUBCUTANEOUS
  Administered 2014-08-18: 3 [IU] via SUBCUTANEOUS
  Administered 2014-08-18: 4 [IU] via SUBCUTANEOUS
  Administered 2014-08-19: 11 [IU] via SUBCUTANEOUS
  Administered 2014-08-19: 7 [IU] via SUBCUTANEOUS

## 2014-08-16 MED ORDER — ALBUTEROL SULFATE (2.5 MG/3ML) 0.083% IN NEBU
2.5000 mg | INHALATION_SOLUTION | Freq: Once | RESPIRATORY_TRACT | Status: AC
  Administered 2014-08-16: 2.5 mg via RESPIRATORY_TRACT
  Filled 2014-08-16: qty 3

## 2014-08-16 MED ORDER — CLOPIDOGREL BISULFATE 75 MG PO TABS
75.0000 mg | ORAL_TABLET | Freq: Every day | ORAL | Status: DC
  Administered 2014-08-17 – 2014-08-19 (×3): 75 mg via ORAL
  Filled 2014-08-16 (×3): qty 1

## 2014-08-16 MED ORDER — BUDESONIDE-FORMOTEROL FUMARATE 160-4.5 MCG/ACT IN AERO
1.0000 | INHALATION_SPRAY | Freq: Two times a day (BID) | RESPIRATORY_TRACT | Status: DC | PRN
  Administered 2014-08-17 – 2014-08-19 (×4): 2 via RESPIRATORY_TRACT
  Filled 2014-08-16: qty 6

## 2014-08-16 MED ORDER — IPRATROPIUM-ALBUTEROL 18-103 MCG/ACT IN AERO: 1.0000 | INHALATION_SPRAY | Freq: Four times a day (QID) | RESPIRATORY_TRACT | Status: DC | PRN

## 2014-08-16 MED ORDER — CLOPIDOGREL BISULFATE 75 MG PO TABS: 75.0000 mg | ORAL_TABLET | Freq: Every day | ORAL | Status: DC

## 2014-08-16 MED ORDER — PANCRELIPASE (LIP-PROT-AMYL) 12000-38000 UNITS PO CPEP: 24000.0000 [IU] | ORAL_CAPSULE | ORAL | Status: DC | PRN

## 2014-08-16 MED ORDER — INSULIN GLARGINE 100 UNIT/ML ~~LOC~~ SOLN
20.0000 [IU] | Freq: Every day | SUBCUTANEOUS | Status: DC
  Administered 2014-08-16 – 2014-08-17 (×2): 20 [IU] via SUBCUTANEOUS
  Filled 2014-08-16 (×3): qty 0.2

## 2014-08-16 MED ORDER — ONDANSETRON HCL 4 MG PO TABS: 4.0000 mg | ORAL_TABLET | Freq: Four times a day (QID) | ORAL | Status: DC | PRN

## 2014-08-16 MED ORDER — LISINOPRIL 10 MG PO TABS: 20.0000 mg | ORAL_TABLET | Freq: Every day | ORAL | Status: DC

## 2014-08-16 MED ORDER — METHYLPREDNISOLONE SODIUM SUCC 125 MG IJ SOLR
125.0000 mg | Freq: Once | INTRAMUSCULAR | Status: AC
Start: 2014-08-16 — End: 2014-08-16
  Administered 2014-08-16: 125 mg via INTRAVENOUS
  Filled 2014-08-16: qty 2

## 2014-08-16 MED ORDER — NITROGLYCERIN 2 % TD OINT
1.0000 [in_us] | TOPICAL_OINTMENT | Freq: Once | TRANSDERMAL | Status: AC
  Administered 2014-08-16: 1 [in_us] via TOPICAL
  Filled 2014-08-16: qty 1

## 2014-08-16 MED ORDER — LORATADINE 10 MG PO TABS: 10.0000 mg | ORAL_TABLET | Freq: Every day | ORAL | Status: DC

## 2014-08-16 MED ORDER — PANTOPRAZOLE SODIUM 40 MG PO TBEC: 40.0000 mg | DELAYED_RELEASE_TABLET | Freq: Every day | ORAL | Status: DC

## 2014-08-16 MED ORDER — GLIPIZIDE ER 2.5 MG PO TB24
2.5000 mg | ORAL_TABLET | Freq: Two times a day (BID) | ORAL | Status: DC
  Administered 2014-08-17: 2.5 mg via ORAL
  Filled 2014-08-16 (×5): qty 1

## 2014-08-16 MED ORDER — SODIUM CHLORIDE 0.9 % IJ SOLN
3.0000 mL | Freq: Two times a day (BID) | INTRAMUSCULAR | Status: DC
  Administered 2014-08-16 – 2014-08-19 (×6): 3 mL via INTRAVENOUS

## 2014-08-16 MED ORDER — METOPROLOL TARTRATE 25 MG PO TABS
25.0000 mg | ORAL_TABLET | Freq: Two times a day (BID) | ORAL | Status: DC
  Administered 2014-08-16 – 2014-08-19 (×6): 25 mg via ORAL
  Filled 2014-08-16 (×6): qty 1

## 2014-08-16 MED ORDER — PANTOPRAZOLE SODIUM 40 MG PO TBEC
40.0000 mg | DELAYED_RELEASE_TABLET | Freq: Every day | ORAL | Status: DC
  Administered 2014-08-17 – 2014-08-19 (×3): 40 mg via ORAL
  Filled 2014-08-16 (×3): qty 1

## 2014-08-16 MED ORDER — ASPIRIN 81 MG PO CHEW
81.0000 mg | CHEWABLE_TABLET | Freq: Every day | ORAL | Status: DC
  Administered 2014-08-16: 81 mg via ORAL
  Filled 2014-08-16: qty 1

## 2014-08-16 MED ORDER — ALBUTEROL SULFATE (2.5 MG/3ML) 0.083% IN NEBU
2.5000 mg | INHALATION_SOLUTION | Freq: Four times a day (QID) | RESPIRATORY_TRACT | Status: DC
  Administered 2014-08-16 – 2014-08-17 (×2): 2.5 mg via RESPIRATORY_TRACT
  Filled 2014-08-16 (×2): qty 3

## 2014-08-16 MED ORDER — PANCRELIPASE (LIP-PROT-AMYL) 12000-38000 UNITS PO CPEP
36000.0000 [IU] | ORAL_CAPSULE | Freq: Three times a day (TID) | ORAL | Status: DC
  Administered 2014-08-17 – 2014-08-19 (×8): 36000 [IU] via ORAL
  Filled 2014-08-16 (×8): qty 3

## 2014-08-16 MED ORDER — LORATADINE 10 MG PO TABS
10.0000 mg | ORAL_TABLET | Freq: Every day | ORAL | Status: DC
  Administered 2014-08-17 – 2014-08-19 (×3): 10 mg via ORAL
  Filled 2014-08-16 (×3): qty 1

## 2014-08-16 MED ORDER — ASPIRIN EC 325 MG PO TBEC
325.0000 mg | DELAYED_RELEASE_TABLET | Freq: Every day | ORAL | Status: DC
  Administered 2014-08-17 – 2014-08-18 (×2): 325 mg via ORAL
  Filled 2014-08-16 (×2): qty 1

## 2014-08-16 MED ORDER — IPRATROPIUM-ALBUTEROL 0.5-2.5 (3) MG/3ML IN SOLN
3.0000 mL | Freq: Once | RESPIRATORY_TRACT | Status: AC
  Administered 2014-08-16: 3 mL via RESPIRATORY_TRACT
  Filled 2014-08-16: qty 3

## 2014-08-16 MED ORDER — INSULIN ASPART 100 UNIT/ML ~~LOC~~ SOLN
0.0000 [IU] | Freq: Every day | SUBCUTANEOUS | Status: DC
  Administered 2014-08-18: 2 [IU] via SUBCUTANEOUS

## 2014-08-16 MED ORDER — LISINOPRIL 10 MG PO TABS
20.0000 mg | ORAL_TABLET | Freq: Every day | ORAL | Status: DC
  Administered 2014-08-17: 20 mg via ORAL
  Filled 2014-08-16: qty 2

## 2014-08-16 MED ORDER — FUROSEMIDE 10 MG/ML IJ SOLN
40.0000 mg | Freq: Three times a day (TID) | INTRAMUSCULAR | Status: DC
  Administered 2014-08-16 – 2014-08-19 (×9): 40 mg via INTRAVENOUS
  Filled 2014-08-16 (×9): qty 4

## 2014-08-16 MED ORDER — INSULIN ASPART 100 UNIT/ML ~~LOC~~ SOLN
10.0000 [IU] | Freq: Once | SUBCUTANEOUS | Status: AC
  Administered 2014-08-16: 10 [IU] via SUBCUTANEOUS

## 2014-08-16 MED ORDER — FENOFIBRATE 160 MG PO TABS
160.0000 mg | ORAL_TABLET | Freq: Every day | ORAL | Status: DC
  Administered 2014-08-17 – 2014-08-19 (×3): 160 mg via ORAL
  Filled 2014-08-16 (×3): qty 1

## 2014-08-16 NOTE — ED Notes (Signed)
Report given to Marcie Bal, [place]. Pt transferred to floor. Additional 450 cc urine output.

## 2014-08-16 NOTE — ED Notes (Signed)
Pt brought in by EMS with c/o SOB. EMS reports O2 sat in the 80s at home. Pt has extensive cardiac and resp hx, Pt c/o abd edema. Reports he is supposed to have sx on Fri for Bifem bypass.

## 2014-08-16 NOTE — ED Notes (Addendum)
CRITICAL VALUE ALERT  Critical value received: troponin/ 0.47  Date of notification:  08/16/2014  Time of notification:  1512  Critical value read back:Yes.    Nurse who received alert:  Allegra Lai, RN  MD notified (1st page):  Dr Rogene Houston

## 2014-08-16 NOTE — H&P (Signed)
[facility] History and Physical  Stephen [name] LFY:[id number] DOB: Jan 28, 1941 DOA: 08/16/2014  Referring physician: ER. PCP: Reynold Bowen, NT   Chief Complaint: Dyspnea.  HPI: Stephen [name] is a 73 y.o. male  This is a 73 year old man, history of COPD and history of congestive heart failure, now presents with dyspnea on exertion which is progressive for the last 3-4 days. He is also noticed progressive leg swelling and abdominal distention. He denies any chest pain. Echocardiogram in December 8527 showed systolic dysfunction with ejection fraction of 45-50%. He denies any productive cough or fever. Evaluation in the emergency room shows chest x-ray consistent with bilateral pleural effusions and pulmonary vascular congestion. He is now being admitted for further management of his congestive heart failure. Initial troponin is also elevated.   Review of Systems:  Constitutional:  No weight loss, night sweats, Fevers, chills, fatigue.  HEENT:  No headaches, Difficulty swallowing,Tooth/dental problems,Sore throat,  No sneezing, itching, ear ache, nasal congestion, post nasal drip,   GI:  No heartburn, indigestion, abdominal pain, nausea, vomiting, diarrhea, change in bowel habits, loss of appetite   Skin:  no rash or lesions.  GU:  no dysuria, change in color of urine, no urgency or frequency. No flank pain.  Musculoskeletal:  No joint pain or swelling. No decreased range of motion. No back pain.  Psych:  No change in mood or affect. No depression or anxiety. No memory loss.   Past Medical History  Diagnosis Date  . Diabetes mellitus   . Hypertension   . Pancreatitis   . Neuropathy   . Hyperlipemia   . Peripheral vascular disease     a. 10/2013: severely abnormal ABIs, for OP evaluation.  [name] GERD (gastroesophageal reflux disease)   . S/P colonoscopy July 2003    hyperplastic polyp, diverticulosis, anal papilla and internal hemorrhoids r  . S/P endoscopy 2003    Dr.  Braulio Bosch: erosive reflux esophagitis , PUD  . PUD (peptic ulcer disease)     diagnosed via EGD by Dr. Braulio Bosch at [place]  . Diverticulosis   . Hemorrhoids   . Coronary artery disease      a. BMS to LAD 1999. b. HSRA to LAD and stent to RCA 2007. c. NSTEMI/CABG x 3 in 04/2010. d. NSTEMI 10/2013 - secondary to demand ischemia in setting of COPD exacerbation with severe underlying CAD - cath with 3/3 patent grafts, for medical therapy.   [name] COPD (chronic obstructive pulmonary disease)     a. Ongoing tobacco abuse.  . Arthritis   . Neutropenia     a. Dx 10/2013 on labs - instructed to f/u PCP.  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   . Ischemic cardiomyopathy     a. previously EF 30% in 2011. b. 10/2013: EF 45-50% by echo.  . Alcoholism     a. Remote alcoholism  . 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   Past Surgical History  Procedure Laterality Date  . Coronary artery bypass graft  June 2011    X3  . Right inguinal hernia repair    . Cholecystectomy      3-4 years ago  . Appendectomy      age 16  . Cataracts    . Colonoscopy  05/12/2002    Dr. [name]- diverticulosis, anal papilla, internal hemorrhoids, rectal polyps  . Azzie Almas  dilation  04/16/2012    Schatzki's ring-status post dilation and disruption/ as described above. Grade 1 esophageal varices. Small hiatal hernia.  [name] dilation  04/16/2012    Procedure: [name] DILATION;  Surgeon: Daneil Dolin, MD;  Location: [facility];  Service: Endoscopy;  Laterality: N/A;  . Colonoscopy  08/10/2012    Procedure: COLONOSCOPY;  Surgeon: Daneil Dolin, MD;  Location: [facility];  Service: Endoscopy;  Laterality: N/A;  9:45 needs 30 mins extra /have Glucagon on hand   Social History:  reports that he has been smoking Cigarettes.  He has a 63 pack-year smoking history. He has never used smokeless tobacco. He  reports that he does not drink alcohol or use illicit drugs.  Allergies  Allergen Reactions  . Levaquin [Levofloxacin] Hives    Family History  Problem Relation Age of Onset  . Coronary artery disease Mother   . Coronary artery disease Father   . Diabetes Sister   . Suicidality Brother   . Cirrhosis Brother   . Colon cancer Neg Hx      Prior to Admission medications   Medication Sig Start Date End Date Taking? Authorizing Provider  albuterol-ipratropium (COMBIVENT) 18-103 MCG/ACT inhaler Inhale 1-2 puffs into the lungs every 6 (six) hours as needed for wheezing or shortness of breath. 11/02/13  Yes Dayna N Dunn, PA-C  aspirin EC 325 MG tablet Take 325 mg by mouth daily.    Yes Historical Provider, MD  clopidogrel (PLAVIX) 75 MG tablet Take 75 mg by mouth daily.   Yes Historical Provider, MD  doxycycline (VIBRA-TABS) 100 MG tablet Take 100 mg by mouth 2 (two) times daily.   Yes Historical Provider, MD  fenofibrate 160 MG tablet Take 160 mg by mouth daily with breakfast.    Yes Historical Provider, MD  gabapentin (NEURONTIN) 100 MG capsule Take 300 mg by mouth at bedtime.  06/08/14  Yes Historical Provider, MD  glipiZIDE (GLUCOTROL XL) 2.5 MG 24 hr tablet Take 2.5 mg by mouth 2 (two) times daily.   Yes Historical Provider, MD  HYDROcodone-acetaminophen (NORCO) 7.5-325 MG per tablet Take 1 tablet by mouth 3 (three) times daily as needed for moderate pain.    Yes Historical Provider, MD  insulin glargine (LANTUS) 100 UNIT/ML injection Inject 20 Units into the skin daily.   Yes Historical Provider, MD  levalbuterol (XOPENEX) 1.25 MG/3ML nebulizer solution Take 0.63 mg by nebulization every 4 (four) hours as needed for wheezing or shortness of breath. 07/14/14  Yes Shanker Kristeen Mans, MD  lisinopril (PRINIVIL,ZESTRIL) 20 MG tablet Take 20 mg by mouth daily.   Yes Historical Provider, MD  loratadine (CLARITIN) 10 MG tablet Take 10 mg by mouth daily.   Yes Historical Provider, MD  metFORMIN  (GLUCOPHAGE) 1000 MG tablet Take 1,000 mg by mouth 2 (two) times daily with a meal.    Yes Historical Provider, MD  metoprolol tartrate (LOPRESSOR) 25 MG tablet Take 25 mg by mouth 2 (two) times daily.    Yes Historical Provider, MD  Pancrelipase, Lip-Prot-Amyl, (CREON) 24000 UNITS CPEP Take 2-3 capsules by mouth See admin instructions. Take 3 capsules with all meals and 2 with snacks   Yes Historical Provider, MD  pantoprazole (PROTONIX) 40 MG tablet Take 1 tablet (40 mg total) by mouth daily. 11/02/13  Yes Dayna N Dunn, PA-C  spironolactone (ALDACTONE) 25 MG tablet Take 25 mg by mouth daily.     Yes Historical Provider, MD  SYMBICORT 160-4.5 MCG/ACT inhaler Inhale 1-2  puffs into the lungs 2 (two) times daily as needed (shorntess of breath).  07/04/12  Yes Historical Provider, MD  tiotropium (SPIRIVA) 18 MCG inhalation capsule Place 18 mcg into inhaler and inhale daily.   Yes Historical Provider, MD  nitroGLYCERIN (NITROSTAT) 0.4 MG SL tablet Place 0.4 mg under the tongue every 5 (five) minutes x 3 doses as needed for chest pain.     Historical Provider, MD   Physical Exam: Filed Vitals:   08/16/14 1415 08/16/14 1445 08/16/14 1528 08/16/14 1738  BP:   146/82 170/92  Pulse: 99 90 91 100  Resp: 20   18  Height:      Weight:      SpO2: 86% 97% 95% 98%    Wt Readings from Last 3 Encounters:  08/16/14 86.183 kg (190 lb)  08/08/14 89.812 kg (198 lb)  07/27/14 86.546 kg (190 lb 12.8 oz)    General:  Appears short of breath at rest. No peripheral or central cyanosis although peripheries appear to be somewhat cool. Eyes: PERRL, normal lids, irises & conjunctiva ENT: grossly normal hearing, lips & tongue Neck: no LAD, masses or thyromegaly Cardiovascular: RRR, no m/r/g. Pitting leg edema up to the knees. Telemetry: SR, no arrhythmias  Respiratory: Bilateral inspiratory wheezing throughout both lung fields. Minimal inspiratory crackles. Abdomen: soft, ntnd Skin: no rash or induration seen on  limited exam Musculoskeletal: grossly normal tone BUE/BLE Psychiatric: grossly normal mood and affect, speech fluent and appropriate Neurologic: grossly non-focal.          Labs on Admission:  Basic Metabolic Panel:  Recent Labs Lab 08/16/14 1351 08/16/14 1534  NA 136*  --   K 5.4* 5.2  CL 95*  --   CO2 32  --   GLUCOSE 435*  --   BUN 17  --   CREATININE 1.19  --   CALCIUM 9.1  --    Liver Function Tests:  Recent Labs Lab 08/16/14 1351  AST 21  ALT 13  ALKPHOS 66  BILITOT <0.2*  PROT 6.4  ALBUMIN 2.3*   No results found for this basename: LIPASE, AMYLASE,  in the last 168 hours No results found for this basename: AMMONIA,  in the last 168 hours CBC:  Recent Labs Lab 08/16/14 1351  WBC 8.2  NEUTROABS 6.6  HGB 10.8*  HCT 34.0*  MCV 92.4  PLT 328   Cardiac Enzymes:  Recent Labs Lab 08/16/14 1351 08/16/14 1650  TROPONINI 0.47* 0.43*    BNP (last 3 results)  Recent Labs  07/09/14 2348 07/21/14 2348 08/16/14 1351  PROBNP 1466.0* 3366.0* 6508.0*   CBG: No results found for this basename: GLUCAP,  in the last 168 hours  Radiological Exams on Admission: Dg Chest Portable 1 View  08/16/2014   CLINICAL DATA:  Shortness of breath for 3 days.  EXAM: PORTABLE CHEST - 1 VIEW  COMPARISON:  07/22/2014  FINDINGS: The patient has developed moderate bilateral pleural effusions. Slight pulmonary vascular congestion. No osseous abnormality. CABG.  IMPRESSION: New moderate bilateral pleural effusions with pulmonary vascular congestion. Findings are consistent with congestive heart failure.   Electronically Signed   By: Rozetta Nunnery M.D.   On: 08/16/2014 14:36    EKG: Independently reviewed. Left bundle branch block which is chronic.  Assessment/Plan   1. Acute on chronic systolic congestive heart failure. 2. Elevated troponin level. 3. COPD, appears to be stable. 4. Hypertension. 5. Diabetes mellitus.  Plan: 1. Admit to telemetry. 2. Intravenous  Lasix. Daily weights.  Input and output measurements. 3. Serial cardiac enzymes. 4. Monitor and control diabetes. 5. Cardiology consultation is appreciated.  Further recommendations will depend on patient's hospital progress.   Code Status: Full code.  DVT Prophylaxis: Heparin.  Family Communication: I discussed the plan with patient at the bedside.   Disposition Plan: Home when medically stable.  Time spent: 60 minutes.  Doree Albee [facility] Pager [phone].

## 2014-08-16 NOTE — Consult Note (Signed)
CARDIOLOGY CONSULT NOTE  Patient ID: Stephen [name] MRN: [id number] DOB/AGE: 1941-10-24 73 y.o.  Admit date: 08/16/2014 Primary Physician Reynold Bowen, NT  Reason for Consultation: CHF, elevated troponin, CAD, PVD  HPI: The patient is a 73 yr old male with a history of CAD and CABG, severe PVD, tobacco abuse, COPD, HTN, diabetes, and hyperlipidemia. Since Saturday (08/13/14), he has been experiencing progressive dyspnea on exertion. For the past three nights, he has slept in a chair at the kitchen table. He denies chest pain. He has noticed progressive leg swelling and abdominal distention. He has had dizziness to the point of falling backwards this morning. He denies frank syncope.  He is reportedly scheduled to undergo a peripheral vascular surgery this upcoming Friday (aortogram with bilateral lower extremity runoff and possible intervention). Chest xray showed moderate bilateral pleural effusions with pulmonary vascular congestion. ECG demonstrates sinus rhythm with LBBB (chronic) and old anteroseptal infarct. Initial troponin 0.47.  He was hospitalized in September for leg pain and was noted to have elevated troponin consistent with demand ischemia in the setting of acute illness. Coronary angiography on 10/29/2013 demonstrated severe native vessel CAD with 3/3 patent bypass grafts (LIMA to LAD, SVG to PDA, SVG to ramus). Echocardiogram on 10/28/13 demonstrated mildly reduced LV systolic function, EF 28-41%, akinesis of the basalinferior myocardium, and grade I diastolic dysfunction. [name] He said, "I quit smoking today".    Allergies  Allergen Reactions  . Levaquin [Levofloxacin] Hives    No current facility-administered medications for this encounter.   Current Outpatient Prescriptions  Medication Sig Dispense Refill  . albuterol-ipratropium (COMBIVENT) 18-103 MCG/ACT inhaler Inhale 1-2 puffs into the lungs every 6 (six) hours as needed for wheezing or shortness of  breath.      [name] aspirin EC 325 MG tablet Take 325 mg by mouth daily.       . clopidogrel (PLAVIX) 75 MG tablet Take 75 mg by mouth daily.      [name] doxycycline (VIBRA-TABS) 100 MG tablet Take 100 mg by mouth 2 (two) times daily.      . fenofibrate 160 MG tablet Take 160 mg by mouth daily with breakfast.       . gabapentin (NEURONTIN) 100 MG capsule Take 300 mg by mouth at bedtime.       [name] glipiZIDE (GLUCOTROL XL) 2.5 MG 24 hr tablet Take 2.5 mg by mouth 2 (two) times daily.      [name] HYDROcodone-acetaminophen (NORCO) 7.5-325 MG per tablet Take 1 tablet by mouth 3 (three) times daily as needed for moderate pain.       [name] insulin glargine (LANTUS) 100 UNIT/ML injection Inject 20 Units into the skin daily.      [name] levalbuterol (XOPENEX) 1.25 MG/3ML nebulizer solution Take 0.63 mg by nebulization every 4 (four) hours as needed for wheezing or shortness of breath.  72 mL  0  . lisinopril (PRINIVIL,ZESTRIL) 20 MG tablet Take 20 mg by mouth daily.      [name] loratadine (CLARITIN) 10 MG tablet Take 10 mg by mouth daily.      . metFORMIN (GLUCOPHAGE) 1000 MG tablet Take 1,000 mg by mouth 2 (two) times daily with a meal.       . metoprolol tartrate (LOPRESSOR) 25 MG tablet Take 25 mg by mouth 2 (two) times daily.       . Pancrelipase, Lip-Prot-Amyl, (CREON) 24000 UNITS CPEP Take 2-3 capsules by mouth See admin instructions. Take 3 capsules with all  meals and 2 with snacks      . pantoprazole (PROTONIX) 40 MG tablet Take 1 tablet (40 mg total) by mouth daily.  30 tablet  1  . spironolactone (ALDACTONE) 25 MG tablet Take 25 mg by mouth daily.        . SYMBICORT 160-4.5 MCG/ACT inhaler Inhale 1-2 puffs into the lungs 2 (two) times daily as needed (shorntess of breath).       . tiotropium (SPIRIVA) 18 MCG inhalation capsule Place 18 mcg into inhaler and inhale daily.      . nitroGLYCERIN (NITROSTAT) 0.4 MG SL tablet Place 0.4 mg under the tongue every 5 (five) minutes x 3 doses as needed for chest pain.         Past  Medical History  Diagnosis Date  . Diabetes mellitus   . Hypertension   . Pancreatitis   . Neuropathy   . Hyperlipemia   . Peripheral vascular disease     a. 10/2013: severely abnormal ABIs, for OP evaluation.  [name] GERD (gastroesophageal reflux disease)   . S/P colonoscopy July 2003    hyperplastic polyp, diverticulosis, anal papilla and internal hemorrhoids r  . S/P endoscopy 2003    Dr. Braulio Bosch: erosive reflux esophagitis , PUD  . PUD (peptic ulcer disease)     diagnosed via EGD by Dr. Braulio Bosch at [facility]  . Diverticulosis   . Hemorrhoids   . Coronary artery disease      a. BMS to LAD 1999. b. HSRA to LAD and stent to RCA 2007. c. NSTEMI/CABG x 3 in 04/2010. d. NSTEMI 10/2013 - secondary to demand ischemia in setting of COPD exacerbation with severe underlying CAD - cath with 3/3 patent grafts, for medical therapy.   [name] COPD (chronic obstructive pulmonary disease)     a. Ongoing tobacco abuse.  . Arthritis   . Neutropenia     a. Dx 10/2013 on labs - instructed to f/u PCP.  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   . Ischemic cardiomyopathy     a. previously EF 30% in 2011. b. 10/2013: EF 45-50% by echo.  . Alcoholism     a. Remote alcoholism  . 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    Past Surgical History  Procedure Laterality Date  . Coronary artery bypass graft  June 2011    X3  . Right inguinal hernia repair    . Cholecystectomy      3-4 years ago  . Appendectomy      age 39  . Cataracts    . Colonoscopy  05/12/2002    Dr. [name]- diverticulosis, anal papilla, internal hemorrhoids, rectal polyps  . [name] dilation  04/16/2012    Schatzki's ring-status post dilation and disruption/ as described above. Grade 1 esophageal varices. Small hiatal hernia.  [name] dilation  04/16/2012    Procedure: [name] DILATION;  Surgeon: Daneil Dolin, MD;   [facility];  Service: Endoscopy;  Laterality: N/A;  . Colonoscopy  08/10/2012    Procedure: COLONOSCOPY;  Surgeon: Daneil Dolin, MD;  [facility];  Service: Endoscopy;  Laterality: N/A;  9:45 needs 30 mins extra /have Glucagon on hand    History   Social History  . Marital Status: Married    Spouse Name: N/A    Number of Children: N/A  . Years of Education:  N/A   Occupational History  . Disabled    Social History Main Topics  . Smoking status: Current Every Day Smoker -- 1.00 packs/day for 63 years    Types: Cigarettes  . Smokeless tobacco: Never Used  . Alcohol Use: No     Comment: Quit 1985  . Drug Use: No  . Sexual Activity: Not Currently   Other Topics Concern  . Not on file   Social History Narrative  . No narrative on file     No family history of premature CAD in 1st degree relatives.  Prior to Admission medications   Medication Sig Start Date End Date Taking? Authorizing Provider  albuterol-ipratropium (COMBIVENT) 18-103 MCG/ACT inhaler Inhale 1-2 puffs into the lungs every 6 (six) hours as needed for wheezing or shortness of breath. 11/02/13  Yes Dayna N Dunn, PA-C  aspirin EC 325 MG tablet Take 325 mg by mouth daily.    Yes Historical Provider, MD  clopidogrel (PLAVIX) 75 MG tablet Take 75 mg by mouth daily.   Yes Historical Provider, MD  doxycycline (VIBRA-TABS) 100 MG tablet Take 100 mg by mouth 2 (two) times daily.   Yes Historical Provider, MD  fenofibrate 160 MG tablet Take 160 mg by mouth daily with breakfast.    Yes Historical Provider, MD  gabapentin (NEURONTIN) 100 MG capsule Take 300 mg by mouth at bedtime.  06/08/14  Yes Historical Provider, MD  glipiZIDE (GLUCOTROL XL) 2.5 MG 24 hr tablet Take 2.5 mg by mouth 2 (two) times daily.   Yes Historical Provider, MD  HYDROcodone-acetaminophen (NORCO) 7.5-325 MG per tablet Take 1 tablet by mouth 3 (three) times daily as needed for moderate pain.    Yes Historical Provider, MD  insulin  glargine (LANTUS) 100 UNIT/ML injection Inject 20 Units into the skin daily.   Yes Historical Provider, MD  levalbuterol (XOPENEX) 1.25 MG/3ML nebulizer solution Take 0.63 mg by nebulization every 4 (four) hours as needed for wheezing or shortness of breath. 07/14/14  Yes Shanker Kristeen Mans, MD  lisinopril (PRINIVIL,ZESTRIL) 20 MG tablet Take 20 mg by mouth daily.   Yes Historical Provider, MD  loratadine (CLARITIN) 10 MG tablet Take 10 mg by mouth daily.   Yes Historical Provider, MD  metFORMIN (GLUCOPHAGE) 1000 MG tablet Take 1,000 mg by mouth 2 (two) times daily with a meal.    Yes Historical Provider, MD  metoprolol tartrate (LOPRESSOR) 25 MG tablet Take 25 mg by mouth 2 (two) times daily.    Yes Historical Provider, MD  Pancrelipase, Lip-Prot-Amyl, (CREON) 24000 UNITS CPEP Take 2-3 capsules by mouth See admin instructions. Take 3 capsules with all meals and 2 with snacks   Yes Historical Provider, MD  pantoprazole (PROTONIX) 40 MG tablet Take 1 tablet (40 mg total) by mouth daily. 11/02/13  Yes Dayna N Dunn, PA-C  spironolactone (ALDACTONE) 25 MG tablet Take 25 mg by mouth daily.     Yes Historical Provider, MD  SYMBICORT 160-4.5 MCG/ACT inhaler Inhale 1-2 puffs into the lungs 2 (two) times daily as needed (shorntess of breath).  07/04/12  Yes Historical Provider, MD  tiotropium (SPIRIVA) 18 MCG inhalation capsule Place 18 mcg into inhaler and inhale daily.   Yes Historical Provider, MD  nitroGLYCERIN (NITROSTAT) 0.4 MG SL tablet Place 0.4 mg under the tongue every 5 (five) minutes x 3 doses as needed for chest pain.     Historical Provider, MD     Review of systems complete and found to be negative unless listed above  in HPI     Physical exam Blood pressure 146/82, pulse 91, resp. rate 20, height 5\' 10"  (1.778 m), weight 190 lb (86.183 kg), SpO2 95.00%. General: Mildly tachypneic Neck: JVP 12 cm H2O, no thyromegaly or thyroid nodule.  Lungs: Diminished breath sounds, marked inspiratory and  expiratory wheezes with prolonged expiratory phase b/l. CV: Nondisplaced PMI. Regular rate and rhythm, normal S1/split S2, no S3/S4, no murmur. 2+ pitting pretibial edema b/l to mid thighs, b/l erythema of LE's, absent pedal pulses.  Abdomen: Soft, nontender, mild distention.  Skin: Ulcers on b/l LE's. Neurologic: Alert and oriented x 3.  Psych: Normal affect. Extremities: No clubbing or cyanosis.  HEENT: Normal.   ECG: Most recent ECG reviewed.  Labs:   Lab Results  Component Value Date   WBC 8.2 08/16/2014   HGB 10.8* 08/16/2014   HCT 34.0* 08/16/2014   MCV 92.4 08/16/2014   PLT 328 08/16/2014    Recent Labs Lab 08/16/14 1351 08/16/14 1534  NA 136*  --   K 5.4* 5.2  CL 95*  --   CO2 32  --   BUN 17  --   CREATININE 1.19  --   CALCIUM 9.1  --   PROT 6.4  --   BILITOT <0.2*  --   ALKPHOS 66  --   ALT 13  --   AST 21  --   GLUCOSE 435*  --    Lab Results  Component Value Date   CKTOTAL 222 07/10/2014   CKMB 5.1* 04/19/2010   TROPONINI 0.47* 08/16/2014    Lab Results  Component Value Date   CHOL 169 10/28/2013   CHOL 211* 06/05/2011   CHOL  Value: 145        ATP III CLASSIFICATION:  <200     mg/dL   Desirable  200-239  mg/dL   Borderline High  >=240    mg/dL   High        04/18/2010   Lab Results  Component Value Date   HDL 53 10/28/2013   HDL 40 06/05/2011   HDL 57 04/18/2010   Lab Results  Component Value Date   LDLCALC 76 10/28/2013   LDLCALC UNABLE TO CALCULATE IF TRIGLYCERIDE OVER 400 mg/dL 06/05/2011   LDLCALC  Value: 43        Total Cholesterol/HDL:CHD Risk Coronary Heart Disease Risk Table                     Men   Women  1/2 Average Risk   3.4   3.3  Average Risk       5.0   4.4  2 X Average Risk   9.6   7.1  3 X Average Risk  23.4   11.0        Use the calculated Patient Ratio above and the CHD Risk Table to determine the patient's CHD Risk.        ATP III CLASSIFICATION (LDL):  <100     mg/dL   Optimal  100-129  mg/dL   Near or Above                     Optimal  130-159  mg/dL   Borderline  160-189  mg/dL   High  >190     mg/dL   Very High 04/18/2010   Lab Results  Component Value Date   TRIG 202* 10/28/2013   TRIG 443* 06/05/2011   TRIG 225* 04/18/2010  Lab Results  Component Value Date   CHOLHDL 3.2 10/28/2013   CHOLHDL 5.3 06/05/2011   CHOLHDL 2.5 04/18/2010   No results found for this basename: LDLDIRECT         Studies: Dg Chest Portable 1 View  08/16/2014   CLINICAL DATA:  Shortness of breath for 3 days.  EXAM: PORTABLE CHEST - 1 VIEW  COMPARISON:  07/22/2014  FINDINGS: The patient has developed moderate bilateral pleural effusions. Slight pulmonary vascular congestion. No osseous abnormality. CABG.  IMPRESSION: New moderate bilateral pleural effusions with pulmonary vascular congestion. Findings are consistent with congestive heart failure.   Electronically Signed   By: Rozetta Nunnery M.D.   On: 08/16/2014 14:36    ASSESSMENT AND PLAN:  1. Acute on chronic systolic heart failure: Will initiate therapy with IV Lasix 40 mg TID. Continue metoprolol, lisinopril, and spironolactone. Would consider increasing lisinopril dose if BP remains elevated after modest diuretic response.  2. Elevated troponin/demand ischemia: Given unchanged ECG and absence of chest pain with only mild troponin elevation, I suspect this represents demand ischemia in the setting of severe CAD. Would, however, continue to cycle troponins to determine if an ischemic etiology is involved. Would not initiate heparin at this time. Can continue ASA and Plavix. 3. Severe PAD: Scheduled for aortogram with bilateral lower extremity runoff and possible intervention with Dr. [name] (vascular surgery) this Friday. This will need to be postponed until patient is stabilized from a cardiac perspective. 4. COPD: Continues to smoke but claims to have quit today. Continue Combivent, Xopenex, and Spiriva. 5. 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 Will reduce ASA to 81 mg daily.  Continue Plavix and beta blocker. Had been on Lipitor 40 mg daily which I will restart. May need long-acting nitrates going forward. 6. Type 2 diabetes: Continue glipizide, metformin, and Lantus. 7. Tobacco abuse: Cessation counseling provided. 8. Hyperlipidemia: Will restart Lipitor 40 mg.  Signed: Kate Sable, M.D., F.A.C.C.  08/16/2014, 4:29 PM

## 2014-08-16 NOTE — ED Notes (Signed)
700 cc urine output.

## 2014-08-16 NOTE — ED Provider Notes (Signed)
CSN: [id number]     Arrival date & time 08/16/14  1302 History   First MD Initiated Contact with Patient 08/16/14 1307     Chief Complaint  Patient presents with  . Shortness of Breath     (Consider location/radiation/quality/duration/timing/severity/associated sxs/prior Treatment) Patient is a 73 y.o. male presenting with shortness of breath. The history is provided by the patient (pt complains of sob since friday).  Shortness of Breath Severity:  Moderate Onset quality:  Sudden Timing:  Constant Progression:  Worsening Chronicity:  New Context: activity   Associated symptoms: wheezing   Associated symptoms: no abdominal pain, no chest pain, no cough, no headaches and no rash     Past Medical History  Diagnosis Date  . Diabetes mellitus   . Hypertension   . Pancreatitis   . Neuropathy   . Hyperlipemia   . Peripheral vascular disease     a. 10/2013: severely abnormal ABIs, for OP evaluation.  [name] GERD (gastroesophageal reflux disease)   . S/P colonoscopy July 2003    hyperplastic polyp, diverticulosis, anal papilla and internal hemorrhoids r  . S/P endoscopy 2003    Dr. Braulio Bosch: erosive reflux esophagitis , PUD  . PUD (peptic ulcer disease)     diagnosed via EGD by Dr. Braulio Bosch at [place]  . Diverticulosis   . Hemorrhoids   . Coronary artery disease      a. BMS to LAD 1999. b. HSRA to LAD and stent to RCA 2007. c. NSTEMI/CABG x 3 in 04/2010. d. NSTEMI 10/2013 - secondary to demand ischemia in setting of COPD exacerbation with severe underlying CAD - cath with 3/3 patent grafts, for medical therapy.   [name] COPD (chronic obstructive pulmonary disease)     a. Ongoing tobacco abuse.  . Arthritis   . Neutropenia     a. Dx 10/2013 on labs - instructed to f/u PCP.  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   . Ischemic cardiomyopathy     a. previously EF 30% in 2011. b. 10/2013: EF 45-50% by  echo.  . Alcoholism     a. Remote alcoholism  . 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   Past Surgical History  Procedure Laterality Date  . Coronary artery bypass graft  June 2011    X3  . Right inguinal hernia repair    . Cholecystectomy      3-4 years ago  . Appendectomy      age 40  . Cataracts    . Colonoscopy  05/12/2002    Dr. [name]- diverticulosis, anal papilla, internal hemorrhoids, rectal polyps  . [name] dilation  04/16/2012    Schatzki's ring-status post dilation and disruption/ as described above. Grade 1 esophageal varices. Small hiatal hernia.  [name] dilation  04/16/2012    Procedure: [name] DILATION;  Surgeon: Daneil Dolin, MD;  [facility];  Service: Endoscopy;  Laterality: N/A;  . Colonoscopy  08/10/2012    Procedure: COLONOSCOPY;  Surgeon: Daneil Dolin, MD;  [facility];  Service: Endoscopy;  Laterality: N/A;  9:45 needs 30 mins extra /have Glucagon on hand   Family History  Problem Relation Age of Onset  . Coronary artery disease Mother   . Coronary artery disease Father   . Diabetes Sister   . Suicidality Brother   . Cirrhosis Brother   . Colon cancer  Neg Hx    History  Substance Use Topics  . Smoking status: Current Every Day Smoker -- 1.00 packs/day for 63 years    Types: Cigarettes  . Smokeless tobacco: Never Used  . Alcohol Use: No     Comment: Quit 1985    Review of Systems  Constitutional: Negative for appetite change and fatigue.  HENT: Negative for congestion, ear discharge and sinus pressure.   Eyes: Negative for discharge.  Respiratory: Positive for shortness of breath and wheezing. Negative for cough.   Cardiovascular: Negative for chest pain.  Gastrointestinal: Negative for abdominal pain and diarrhea.  Genitourinary: Negative for frequency and hematuria.  Musculoskeletal: Negative for back pain.  Skin: Negative for rash.  Neurological: Negative for seizures and  headaches.  Psychiatric/Behavioral: Negative for hallucinations.      Allergies  Levaquin  Home Medications   Prior to Admission medications   Medication Sig Start Date End Date Taking? Authorizing Provider  albuterol-ipratropium (COMBIVENT) 18-103 MCG/ACT inhaler Inhale 1-2 puffs into the lungs every 6 (six) hours as needed for wheezing or shortness of breath. 11/02/13  Yes Dayna N Dunn, PA-C  aspirin EC 325 MG tablet Take 325 mg by mouth daily.    Yes Historical Provider, MD  clopidogrel (PLAVIX) 75 MG tablet Take 75 mg by mouth daily.   Yes Historical Provider, MD  doxycycline (VIBRA-TABS) 100 MG tablet Take 100 mg by mouth 2 (two) times daily.   Yes Historical Provider, MD  fenofibrate 160 MG tablet Take 160 mg by mouth daily with breakfast.    Yes Historical Provider, MD  gabapentin (NEURONTIN) 100 MG capsule Take 300 mg by mouth at bedtime.  06/08/14  Yes Historical Provider, MD  glipiZIDE (GLUCOTROL XL) 2.5 MG 24 hr tablet Take 2.5 mg by mouth 2 (two) times daily.   Yes Historical Provider, MD  HYDROcodone-acetaminophen (NORCO) 7.5-325 MG per tablet Take 1 tablet by mouth 3 (three) times daily as needed for moderate pain.    Yes Historical Provider, MD  insulin glargine (LANTUS) 100 UNIT/ML injection Inject 20 Units into the skin daily.   Yes Historical Provider, MD  levalbuterol (XOPENEX) 1.25 MG/3ML nebulizer solution Take 0.63 mg by nebulization every 4 (four) hours as needed for wheezing or shortness of breath. 07/14/14  Yes Shanker Kristeen Mans, MD  lisinopril (PRINIVIL,ZESTRIL) 20 MG tablet Take 20 mg by mouth daily.   Yes Historical Provider, MD  loratadine (CLARITIN) 10 MG tablet Take 10 mg by mouth daily.   Yes Historical Provider, MD  metFORMIN (GLUCOPHAGE) 1000 MG tablet Take 1,000 mg by mouth 2 (two) times daily with a meal.    Yes Historical Provider, MD  metoprolol tartrate (LOPRESSOR) 25 MG tablet Take 25 mg by mouth 2 (two) times daily.    Yes Historical Provider, MD   Pancrelipase, Lip-Prot-Amyl, (CREON) 24000 UNITS CPEP Take 2-3 capsules by mouth See admin instructions. Take 3 capsules with all meals and 2 with snacks   Yes Historical Provider, MD  pantoprazole (PROTONIX) 40 MG tablet Take 1 tablet (40 mg total) by mouth daily. 11/02/13  Yes Dayna N Dunn, PA-C  spironolactone (ALDACTONE) 25 MG tablet Take 25 mg by mouth daily.     Yes Historical Provider, MD  SYMBICORT 160-4.5 MCG/ACT inhaler Inhale 1-2 puffs into the lungs 2 (two) times daily as needed (shorntess of breath).  07/04/12  Yes Historical Provider, MD  tiotropium (SPIRIVA) 18 MCG inhalation capsule Place 18 mcg into inhaler and inhale daily.   Yes Historical Provider,  MD  nitroGLYCERIN (NITROSTAT) 0.4 MG SL tablet Place 0.4 mg under the tongue every 5 (five) minutes x 3 doses as needed for chest pain.     Historical Provider, MD   BP 146/82  Pulse 91  Resp 20  Ht 5\' 10"  (1.778 m)  Wt 190 lb (86.183 kg)  BMI 27.26 kg/m2  SpO2 95% Physical Exam  Constitutional: He is oriented to person, place, and time. He appears well-developed.  HENT:  Head: Normocephalic.  Eyes: Conjunctivae and EOM are normal. No scleral icterus.  Neck: Neck supple. No thyromegaly present.  Cardiovascular: Normal rate and regular rhythm.  Exam reveals no gallop and no friction rub.   No murmur heard. Pulmonary/Chest: No stridor. He has wheezes. He has no rales. He exhibits no tenderness.  Abdominal: He exhibits no distension. There is no tenderness. There is no rebound.  Musculoskeletal: Normal range of motion. He exhibits no edema.  Lymphadenopathy:    He has no cervical adenopathy.  Neurological: He is oriented to person, place, and time. He exhibits normal muscle tone. Coordination normal.  Skin: No rash noted. No erythema.  Psychiatric: He has a normal mood and affect. His behavior is normal.    ED Course  Procedures (including critical care time) Labs Review Labs Reviewed  CBC WITH DIFFERENTIAL - Abnormal;  Notable for the following:    RBC 3.68 (*)    Hemoglobin 10.8 (*)    HCT 34.0 (*)    Neutrophils Relative % 81 (*)    All other components within normal limits  COMPREHENSIVE METABOLIC PANEL - Abnormal; Notable for the following:    Sodium 136 (*)    Potassium 5.4 (*)    Chloride 95 (*)    Glucose, Bld 435 (*)    Albumin 2.3 (*)    Total Bilirubin <0.2 (*)    GFR calc non Af Amer 60 (*)    GFR calc Af Amer 69 (*)    All other components within normal limits  TROPONIN I - Abnormal; Notable for the following:    Troponin I 0.47 (*)    All other components within normal limits  PRO B NATRIURETIC PEPTIDE - Abnormal; Notable for the following:    Pro B Natriuretic peptide (BNP) 6508.0 (*)    All other components within normal limits  POTASSIUM  TROPONIN I  TROPONIN I  TROPONIN I    Imaging Review Dg Chest Portable 1 View  08/16/2014   CLINICAL DATA:  Shortness of breath for 3 days.  EXAM: PORTABLE CHEST - 1 VIEW  COMPARISON:  07/22/2014  FINDINGS: The patient has developed moderate bilateral pleural effusions. Slight pulmonary vascular congestion. No osseous abnormality. CABG.  IMPRESSION: New moderate bilateral pleural effusions with pulmonary vascular congestion. Findings are consistent with congestive heart failure.   Electronically Signed   By: Rozetta Nunnery M.D.   On: 08/16/2014 14:36     EKG Interpretation   Date/Time:  Tuesday August 16 2014 13:25:20 EDT Ventricular Rate:  88 PR Interval:  156 QRS Duration: 138 QT Interval:  392 QTC Calculation: 474 R Axis:   19 Text Interpretation:  Normal sinus rhythm Non-specific intra-ventricular  conduction block Cannot rule out Septal infarct , age undetermined  Abnormal ECG Confirmed by Jinna Weinman  MD, Broadus John [phone]) on 08/16/2014 3:18:10  PM      MDM   Final diagnoses:  Systolic congestive heart failure, unspecified congestive heart failure chronicity    Spoke with cardiology and they will follow up pt at  [facility].   Will admit to [facility] for chf    Maudry Diego, MD 08/16/14 1719

## 2014-08-16 NOTE — ED Notes (Signed)
Pt given 2 nitro tablets in route by EMS per CHF protocol. Pt speaking in complete sentences. Diffuse wheezing throughout lung fields. Pt c/o abd edema and LE edema. Mult venous ulcers noted to bilat LE. L lower leg worse than R. Resp paged for tx.

## 2014-08-17 DIAGNOSIS — J9601 Acute respiratory failure with hypoxia: Secondary | ICD-10-CM

## 2014-08-17 LAB — COMPREHENSIVE METABOLIC PANEL
ALT: 11 U/L (ref 0–53)
AST: 21 U/L (ref 0–37)
Albumin: 2.2 g/dL — ABNORMAL LOW (ref 3.5–5.2)
Alkaline Phosphatase: 63 U/L (ref 39–117)
Anion gap: 9 (ref 5–15)
BUN: 19 mg/dL (ref 6–23)
CHLORIDE: 96 meq/L (ref 96–112)
CO2: 34 meq/L — AB (ref 19–32)
CREATININE: 1.01 mg/dL (ref 0.50–1.35)
Calcium: 9.3 mg/dL (ref 8.4–10.5)
GFR calc Af Amer: 84 mL/min — ABNORMAL LOW (ref >90)
GFR, EST NON AFRICAN AMERICAN: 73 mL/min — AB (ref >90)
Glucose, Bld: 258 mg/dL — ABNORMAL HIGH (ref 70–99)
Potassium: 4.7 mEq/L (ref 3.7–5.3)
SODIUM: 139 meq/L (ref 137–147)
Total Protein: 6.4 g/dL (ref 6.0–8.3)

## 2014-08-17 LAB — GLUCOSE, CAPILLARY
GLUCOSE-CAPILLARY: 165 mg/dL — AB (ref 70–99)
GLUCOSE-CAPILLARY: 125 mg/dL — AB (ref 70–99)
Glucose-Capillary: 235 mg/dL — ABNORMAL HIGH (ref 70–99)
Glucose-Capillary: 113 mg/dL — ABNORMAL HIGH (ref 70–99)

## 2014-08-17 LAB — CBC
HCT: 33.9 % — ABNORMAL LOW (ref 39.0–52.0)
Hemoglobin: 10.9 g/dL — ABNORMAL LOW (ref 13.0–17.0)
MCH: 29.3 pg (ref 26.0–34.0)
MCHC: 32.2 g/dL (ref 30.0–36.0)
MCV: 91.1 fL (ref 78.0–100.0)
PLATELETS: 369 10*3/uL (ref 150–400)
RBC: 3.72 MIL/uL — AB (ref 4.22–5.81)
RDW: 14.9 % (ref 11.5–15.5)
WBC: 8.2 10*3/uL (ref 4.0–10.5)

## 2014-08-17 LAB — HEMOGLOBIN A1C
Hgb A1c MFr Bld: 9.3 % — ABNORMAL HIGH (ref <5.7)
MEAN PLASMA GLUCOSE: 220 mg/dL — AB (ref <117)

## 2014-08-17 LAB — TROPONIN I: Troponin I: 0.52 ng/mL (ref <0.30)

## 2014-08-17 LAB — PRO B NATRIURETIC PEPTIDE: Pro B Natriuretic peptide (BNP): 9230 pg/mL — ABNORMAL HIGH (ref 0–125)

## 2014-08-17 MED ORDER — CETYLPYRIDINIUM CHLORIDE 0.05 % MT LIQD
7.0000 mL | Freq: Two times a day (BID) | OROMUCOSAL | Status: DC
  Administered 2014-08-17 – 2014-08-19 (×3): 7 mL via OROMUCOSAL

## 2014-08-17 MED ORDER — IPRATROPIUM-ALBUTEROL 0.5-2.5 (3) MG/3ML IN SOLN
3.0000 mL | RESPIRATORY_TRACT | Status: DC
  Administered 2014-08-17 – 2014-08-19 (×13): 3 mL via RESPIRATORY_TRACT
  Filled 2014-08-17 (×13): qty 3

## 2014-08-17 MED ORDER — ALBUTEROL SULFATE (2.5 MG/3ML) 0.083% IN NEBU: 2.5000 mg | INHALATION_SOLUTION | RESPIRATORY_TRACT | Status: DC | PRN

## 2014-08-17 MED ORDER — LISINOPRIL 10 MG PO TABS
20.0000 mg | ORAL_TABLET | Freq: Two times a day (BID) | ORAL | Status: DC
  Administered 2014-08-17 – 2014-08-19 (×4): 20 mg via ORAL
  Filled 2014-08-17 (×4): qty 2

## 2014-08-17 MED ORDER — ALBUTEROL SULFATE (2.5 MG/3ML) 0.083% IN NEBU: 2.5000 mg | INHALATION_SOLUTION | RESPIRATORY_TRACT | Status: DC

## 2014-08-17 MED ORDER — IPRATROPIUM-ALBUTEROL 0.5-2.5 (3) MG/3ML IN SOLN: 3.0000 mL | RESPIRATORY_TRACT | Status: DC | PRN

## 2014-08-17 NOTE — Progress Notes (Signed)
CRITICAL VALUE ALERT  Critical value received:  Troponin 0.52  Date of notification:  08/17/2014  Time of notification:  1610  Critical value read back:Yes.    Nurse who received alert:  Gershon Cull, RN  MD notified (1st page):  Dr. Sarajane Jews  Time of first page:  [phone]  MD notified (2nd page):  Time of second page:  Responding MD:  Dr. Lajuana Carry  Time MD responded:  [phone]

## 2014-08-17 NOTE — Progress Notes (Signed)
PROGRESS NOTE  Stephen [name]:[id number] DOB: [birth year] DOA: 08/16/2014 PCP: Reynold Bowen, NT  Summary: 73 year old man presented with DOE, leg swellling; PMH CHF, CABG, COPD. Admitted for acute on chronic systolic CHF.  Assessment/Plan: 1. Acute hypoxic resp failure secondary to CHF. 2. Acute on chronic systolic heart failure, some improvement with diuresis. 3. Elevated troponin/demand ischemia, stable, no further evaluation per cardiology. 4. COPD, appears satble. 5. H/o CAD/CABG. Continue ASA and Plavix. 6. Tobacco dependence, ongoing smoking--reports quitting this admission. 7. DM, stable. 8. Non-healing ulcers left leg secondary to severe chronic PAD, has angiogram scheduled for 10/16.   Overall somewhat better, continue diuresis per cardiology. No further evaluation of troponin.  BMP in AM  Code Status: full code DVT prophylaxis: heparin Family Communication: none present Disposition Plan: home  [name], MD  [facility]  Pager [phone] If 7PM-7AM, please contact night-coverage at [email or web address], password [facility] 08/17/2014, 1:23 PM  LOS: 1 day   Consultants:  Cardiology   Procedures:    Antibiotics:    HPI/Subjective: Still short of breath but no chest pain. Chronic BLE pain.  Objective: Filed Vitals:   08/17/14 0554 08/17/14 0624 08/17/14 0656 08/17/14 1055  BP: 148/78     Pulse: 101     Temp: 98.5 F (36.9 C)     TempSrc: Oral     Resp: 20     Height:      Weight:  95.482 kg (210 lb 8 oz)    SpO2: 93%  97% 97%    Intake/Output Summary (Last 24 hours) at 08/17/14 1323 Last data filed at 08/17/14 1300  Gross per 24 hour  Intake    480 ml  Output   2575 ml  Net  -2095 ml     Filed Weights   08/16/14 1313 08/16/14 1925 08/17/14 0624  Weight: 86.183 kg (190 lb) 94.575 kg (208 lb 8 oz) 95.482 kg (210 lb 8 oz)    Exam:     Afebrile, VSS  General: appears calm, comfortable  Psych: alert, speech fluent and  clear  CV: RRR no m/r/g. 1+ BLE edema.  Respiratory: bilateral wheezes, no frank rhonchi or rales. Mild increased resp effort.  Skin: chronic non-healing wounds BLE  Data Reviewed:  UOP 1800, -2 L since admission  CMP unremarkaable  Troponin without sig change, .43 >> .45 >> .52  Hgb stable at 10.9  CXR on admission suggested CHF  Scheduled Meds: . aspirin EC  325 mg Oral Daily  . atorvastatin  40 mg Oral q1800  . clopidogrel  75 mg Oral Daily  . fenofibrate  160 mg Oral Q breakfast  . furosemide  40 mg Intravenous TID  . gabapentin  300 mg Oral QHS  . glipiZIDE  2.5 mg Oral BID AC  . heparin  5,000 Units Subcutaneous 3 times per day  . insulin aspart  0-20 Units Subcutaneous TID WC  . insulin aspart  0-5 Units Subcutaneous QHS  . insulin glargine  20 Units Subcutaneous QHS  . ipratropium-albuterol  3 mL Nebulization Q4H  . lipase/protease/amylase  36,000 Units Oral TID AC  . lisinopril  20 mg Oral BID  . loratadine  10 mg Oral Daily  . metFORMIN  1,000 mg Oral BID WC  . metoprolol tartrate  25 mg Oral BID  . pantoprazole  40 mg Oral Daily  . sodium chloride  3 mL Intravenous Q12H  . spironolactone  25 mg Oral Daily   Continuous Infusions:   Principal Problem:  Acute on chronic systolic CHF (congestive heart failure), NYHA class 3 Active Problems:   Acute respiratory failure with hypoxia   Elevated troponin   Essential hypertension   COPD (chronic obstructive pulmonary disease)   Congestive heart failure   Time spent 20 minutes

## 2014-08-17 NOTE — Progress Notes (Signed)
SUBJECTIVE: Pt still short of breath but improved since yesterday. Said he had episode of shortness of breath at 3 am for which O2 was increased to 3L.     Intake/Output Summary (Last 24 hours) at 08/17/14 1038 Last data filed at 08/17/14 0800  Gross per 24 hour  Intake    480 ml  Output   1800 ml  Net  -1320 ml    Current Facility-Administered Medications  Medication Dose Route Frequency Provider Last Rate Last Dose  . aspirin EC tablet 325 mg  325 mg Oral Daily Doree Albee, MD   325 mg at 08/17/14 0824  . atorvastatin (LIPITOR) tablet 40 mg  40 mg Oral q1800 Herminio Commons, MD   40 mg at 08/16/14 2107  . budesonide-formoterol (SYMBICORT) 160-4.5 MCG/ACT inhaler 1-2 puff  1-2 puff Inhalation BID PRN Nimish C Gosrani, MD      . clopidogrel (PLAVIX) tablet 75 mg  75 mg Oral Daily Nimish C Gosrani, MD   75 mg at 08/17/14 1006  . fenofibrate tablet 160 mg  160 mg Oral Q breakfast Nimish C Anastasio Champion, MD   160 mg at 08/17/14 0823  . furosemide (LASIX) injection 40 mg  40 mg Intravenous TID Herminio Commons, MD   40 mg at 08/17/14 1007  . gabapentin (NEURONTIN) capsule 300 mg  300 mg Oral QHS Doree Albee, MD   300 mg at 08/16/14 2107  . glipiZIDE (GLUCOTROL XL) 24 hr tablet 2.5 mg  2.5 mg Oral BID AC Nimish C Gosrani, MD      . heparin injection 5,000 Units  5,000 Units Subcutaneous 3 times per day Doree Albee, MD   5,000 Units at 08/17/14 0559  . HYDROcodone-acetaminophen (NORCO) 7.5-325 MG per tablet 1 tablet  1 tablet Oral TID PRN Doree Albee, MD   1 tablet at 08/17/14 0630  . insulin aspart (novoLOG) injection 0-20 Units  0-20 Units Subcutaneous TID WC Doree Albee, MD   7 Units at 08/17/14 9528  . insulin aspart (novoLOG) injection 0-5 Units  0-5 Units Subcutaneous QHS Nimish C Gosrani, MD      . insulin glargine (LANTUS) injection 20 Units  20 Units Subcutaneous QHS Doree Albee, MD   20 Units at 08/16/14 2242  . ipratropium-albuterol (DUONEB)  0.5-2.5 (3) MG/3ML nebulizer solution 3 mL  3 mL Nebulization Q4H Nimish C Gosrani, MD   3 mL at 08/17/14 0655  . ipratropium-albuterol (DUONEB) 0.5-2.5 (3) MG/3ML nebulizer solution 3 mL  3 mL Nebulization Q2H PRN Nimish C Gosrani, MD      . lipase/protease/amylase (CREON) capsule 24,000 Units  24,000 Units Oral PRN Nimish Luther Parody, MD      . lipase/protease/amylase (CREON) capsule 36,000 Units  36,000 Units Oral TID AC Doree Albee, MD   36,000 Units at 08/17/14 4132  . lisinopril (PRINIVIL,ZESTRIL) tablet 20 mg  20 mg Oral BID Herminio Commons, MD      . loratadine (CLARITIN) tablet 10 mg  10 mg Oral Daily Doree Albee, MD   10 mg at 08/17/14 0823  . metFORMIN (GLUCOPHAGE) tablet 1,000 mg  1,000 mg Oral BID WC Nimish Luther Parody, MD   1,000 mg at 08/17/14 0823  . metoprolol tartrate (LOPRESSOR) tablet 25 mg  25 mg Oral BID Nimish C Anastasio Champion, MD   25 mg at 08/17/14 1006  . nitroGLYCERIN (NITROSTAT) SL tablet 0.4 mg  0.4 mg Sublingual Q5 Min x  3 PRN Nimish Luther Parody, MD      . ondansetron (ZOFRAN) tablet 4 mg  4 mg Oral Q6H PRN Nimish C Gosrani, MD       Or  . ondansetron (ZOFRAN) injection 4 mg  4 mg Intravenous Q6H PRN Nimish C Gosrani, MD      . pantoprazole (PROTONIX) EC tablet 40 mg  40 mg Oral Daily Nimish C Anastasio Champion, MD   40 mg at 08/17/14 0823  . sodium chloride 0.9 % injection 3 mL  3 mL Intravenous Q12H Nimish C Gosrani, MD   3 mL at 08/17/14 1000  . spironolactone (ALDACTONE) tablet 25 mg  25 mg Oral Daily Doree Albee, MD   25 mg at 08/17/14 1006    Filed Vitals:   08/17/14 0208 08/17/14 0554 08/17/14 0624 08/17/14 0656  BP:  148/78    Pulse:  101    Temp:  98.5 F (36.9 C)    TempSrc:  Oral    Resp:  20    Height:      Weight:   210 lb 8 oz (95.482 kg)   SpO2: 92% 93%  97%    PHYSICAL EXAM General: NAD Neck: JVP 10-12 cm H2O, no thyromegaly or thyroid nodule.  Lungs: Diminished breath sounds, marked inspiratory and expiratory wheezes with prolonged  expiratory phase b/l.  CV: Nondisplaced PMI. Regular rate and rhythm, normal S1/split S2, no S3/S4, no murmur. 2+ pitting pretibial edema b/l to mid thighs, b/l erythema of LE's, absent pedal pulses.  Abdomen: Soft, nontender, mild distention.  Skin: Ulcers on b/l LE's.  Neurologic: Alert and oriented x 3.  Psych: Normal affect.  Extremities: No clubbing or cyanosis.  HEENT: Normal.    TELEMETRY: Reviewed telemetry pt in sinus rhythm.  LABS: Basic Metabolic Panel:  Recent Labs  08/16/14 1351 08/16/14 1534 08/16/14 2107 08/17/14 0609  NA 136*  --   --  139  K 5.4* 5.2  --  4.7  CL 95*  --   --  96  CO2 32  --   --  34*  GLUCOSE 435*  --  494* 258*  BUN 17  --   --  19  CREATININE 1.19  --   --  1.01  CALCIUM 9.1  --   --  9.3   Liver Function Tests:  Recent Labs  08/16/14 1351 08/17/14 0609  AST 21 21  ALT 13 11  ALKPHOS 66 63  BILITOT <0.2* <0.2*  PROT 6.4 6.4  ALBUMIN 2.3* 2.2*   No results found for this basename: LIPASE, AMYLASE,  in the last 72 hours CBC:  Recent Labs  08/16/14 1351 08/17/14 0609  WBC 8.2 8.2  NEUTROABS 6.6  --   HGB 10.8* 10.9*  HCT 34.0* 33.9*  MCV 92.4 91.1  PLT 328 369   Cardiac Enzymes:  Recent Labs  08/16/14 1650 08/16/14 2234 08/17/14 0609  TROPONINI 0.43* 0.45* 0.52*   BNP: No components found with this basename: POCBNP,  D-Dimer: No results found for this basename: DDIMER,  in the last 72 hours Hemoglobin A1C: No results found for this basename: HGBA1C,  in the last 72 hours Fasting Lipid Panel: No results found for this basename: CHOL, HDL, LDLCALC, TRIG, CHOLHDL, LDLDIRECT,  in the last 72 hours Thyroid Function Tests: No results found for this basename: TSH, T4TOTAL, FREET3, T3FREE, THYROIDAB,  in the last 72 hours Anemia Panel: No results found for this basename: VITAMINB12, FOLATE, FERRITIN, TIBC, IRON, RETICCTPCT,  in  the last 72 hours  RADIOLOGY: Dg Chest 2 View  07/22/2014   CLINICAL DATA:   Shortness of breath and cough.  Hypoglycemia.  EXAM: CHEST  2 VIEW  COMPARISON:  Chest radiograph performed 07/10/2014  FINDINGS: The lungs are well-aerated. Small bilateral pleural effusions are seen. Increased interstitial markings may reflect mild interstitial edema. Vascular congestion is noted. No pneumothorax is identified.  The heart is borderline normal in size. The patient is status post median sternotomy, with evidence of prior CABG. No acute osseous abnormalities are seen.  IMPRESSION: Small bilateral pleural effusions seen. Vascular congestion noted. Increased interstitial markings may reflect mild interstitial edema.   Electronically Signed   By: Garald Balding M.D.   On: 07/22/2014 01:14   Dg Chest Portable 1 View  08/16/2014   CLINICAL DATA:  Shortness of breath for 3 days.  EXAM: PORTABLE CHEST - 1 VIEW  COMPARISON:  07/22/2014  FINDINGS: The patient has developed moderate bilateral pleural effusions. Slight pulmonary vascular congestion. No osseous abnormality. CABG.  IMPRESSION: New moderate bilateral pleural effusions with pulmonary vascular congestion. Findings are consistent with congestive heart failure.   Electronically Signed   By: Rozetta Nunnery M.D.   On: 08/16/2014 14:36      ASSESSMENT AND PLAN: 1. Acute on chronic systolic heart failure: Good diuretic response with IV Lasix 40 mg TID. Continue metoprolol, lisinopril (increase to 20 mg bid due to elevated BP), and spironolactone.  2. Elevated troponin/demand ischemia: Given unchanged ECG and absence of chest pain with only mild troponin elevation with relatively flat curve, I suspect this represents demand ischemia in the setting of severe CAD. Continue ASA and Plavix.  3. Severe PAD: Scheduled for aortogram with bilateral lower extremity runoff and possible intervention with Dr. [name] (vascular surgery) this Friday. This will need to be postponed until patient is stabilized from a cardiac perspective.  4. COPD: Has smoked  since age 31 claimed to have quit on admission. Continue Combivent, Xopenex, and Spiriva.  5. 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 Continue ASA 81 mg, Plavix, Lipitor, and beta blocker. May need long-acting nitrates going forward but will not start as of yet.  6. Type 2 diabetes: Continue glipizide, metformin, and Lantus.  7. Tobacco abuse: Cessation counseling provided.  8. Hyperlipidemia: Continue Lipitor 40 mg.   Kate Sable, M.D., F.A.C.C.

## 2014-08-18 DIAGNOSIS — Z720 Tobacco use: Secondary | ICD-10-CM

## 2014-08-18 DIAGNOSIS — N189 Chronic kidney disease, unspecified: Secondary | ICD-10-CM

## 2014-08-18 DIAGNOSIS — E1122 Type 2 diabetes mellitus with diabetic chronic kidney disease: Secondary | ICD-10-CM

## 2014-08-18 DIAGNOSIS — J449 Chronic obstructive pulmonary disease, unspecified: Secondary | ICD-10-CM

## 2014-08-18 LAB — GLUCOSE, CAPILLARY
GLUCOSE-CAPILLARY: 144 mg/dL — AB (ref 70–99)
Glucose-Capillary: 57 mg/dL — ABNORMAL LOW (ref 70–99)
Glucose-Capillary: 138 mg/dL — ABNORMAL HIGH (ref 70–99)
Glucose-Capillary: 59 mg/dL — ABNORMAL LOW (ref 70–99)
Glucose-Capillary: 195 mg/dL — ABNORMAL HIGH (ref 70–99)
Glucose-Capillary: 237 mg/dL — ABNORMAL HIGH (ref 70–99)

## 2014-08-18 LAB — BASIC METABOLIC PANEL
ANION GAP: 9 (ref 5–15)
BUN: 23 mg/dL (ref 6–23)
CHLORIDE: 97 meq/L (ref 96–112)
CO2: 37 meq/L — AB (ref 19–32)
Calcium: 9.6 mg/dL (ref 8.4–10.5)
Creatinine, Ser: 1.14 mg/dL (ref 0.50–1.35)
GFR calc Af Amer: 73 mL/min — ABNORMAL LOW (ref >90)
GFR calc non Af Amer: 63 mL/min — ABNORMAL LOW (ref >90)
Glucose, Bld: 66 mg/dL — ABNORMAL LOW (ref 70–99)
POTASSIUM: 4.2 meq/L (ref 3.7–5.3)
SODIUM: 143 meq/L (ref 137–147)

## 2014-08-18 MED ORDER — ISOSORBIDE MONONITRATE ER 60 MG PO TB24
30.0000 mg | ORAL_TABLET | Freq: Every day | ORAL | Status: DC
  Administered 2014-08-18 – 2014-08-19 (×2): 30 mg via ORAL
  Filled 2014-08-18 (×2): qty 1

## 2014-08-18 MED ORDER — ASPIRIN 81 MG PO CHEW
81.0000 mg | CHEWABLE_TABLET | Freq: Every day | ORAL | Status: DC
  Administered 2014-08-19: 81 mg via ORAL
  Filled 2014-08-18: qty 1

## 2014-08-18 MED ORDER — SODIUM CHLORIDE 0.9 % IV SOLN: INTRAVENOUS | Status: DC

## 2014-08-18 MED ORDER — INSULIN GLARGINE 100 UNIT/ML ~~LOC~~ SOLN
15.0000 [IU] | Freq: Every day | SUBCUTANEOUS | Status: DC
  Administered 2014-08-18: 15 [IU] via SUBCUTANEOUS
  Filled 2014-08-18 (×4): qty 0.15

## 2014-08-18 NOTE — Progress Notes (Signed)
Hypoglycemic Event  CBG: 57  Treatment: 15 GM carbohydrate snack  Symptoms: None  Follow-up CBG: Time:0820 CBG Result:138  Possible Reasons for Event: Medication regimen: Insulin/SSI  Comments/MD notified:Dr. Sarajane Jews made aware via text page.    Stephen [name], Stephen [name]  Remember to initiate Hypoglycemia Order Set & complete

## 2014-08-18 NOTE — Progress Notes (Signed)
PROGRESS NOTE  Stephen [name] PPJ:[id number] DOB: 01-30-1941 DOA: 08/16/2014 PCP: Reynold Bowen, NT  Summary: 73 year old man presented with DOE, leg swellling; PMH CHF, CABG, COPD. Admitted for acute on chronic systolic CHF.  Assessment/Plan: 1. Acute hypoxic resp failure secondary to CHF. Stable.  2. Acute on chronic systolic heart failure, excellent diuresis though still SOB. 3. Elevated troponin/demand ischemia, stable, no further evaluation per cardiology. 4. COPD, appears stable. 5. H/o CAD/CABG. Continue ASA and Plavix. 6. Tobacco dependence, ongoing smoking--reports quitting this admission. 7. DM, with hypoglycemia this AM. Improved now. 8. Non-healing ulcers left leg secondary to severe chronic PAD, has angiogram scheduled for 10/16.   Excellent diuresis, likely change to oral diuretics in the morning. Suspect a component of chronic hypoxia secondary to COPD and tobacco dependence. May need home oxygen. Reassess in the morning.  Decrease Lantus.  Likely discharged 10/16.  Code Status: full code DVT prophylaxis: heparin Family Communication: none present Disposition Plan: home  [name], MD  [facility]  Pager [phone] If 7PM-7AM, please contact night-coverage at [email or web address], password [facility] 08/18/2014, 4:03 PM  LOS: 2 days   Consultants:  Cardiology   Procedures:    Antibiotics:    HPI/Subjective: Excellent diuresis, cardiology plans to continue IV Lasix today.  Feels about the same, no better; still SOB, however wants to go home tomorrow.  Objective: Filed Vitals:   08/18/14 0727 08/18/14 1121 08/18/14 1458 08/18/14 1516  BP:   150/70   Pulse:   90   Temp:   97.7 F (36.5 C)   TempSrc:   Oral   Resp:   20   Height:      Weight:      SpO2: 94% 95% 97% 87%    Intake/Output Summary (Last 24 hours) at 08/18/14 1603 Last data filed at 08/18/14 1251  Gross per 24 hour  Intake    840 ml  Output   7050 ml  Net  -6210 ml      Filed Weights   08/16/14 1925 08/17/14 0624 08/18/14 0520  Weight: 94.575 kg (208 lb 8 oz) 95.482 kg (210 lb 8 oz) 96.42 kg (212 lb 9.1 oz)    Exam:     Afebrile, VSS, SpO2 87% on RA  Alert, appears calm, comfortable, mildly SOB  CV RRR no m/r/g. 1+ BLE edema.  Resp coarse BS, fair air movement, no frank w/r/r. Mild increased resp effort, able to speak in full sentences  BLE appear unchanged  Data Reviewed:  UOP 5925  CBG 59 this AM  BMP stable, normal creatinine  Scheduled Meds: . antiseptic oral rinse  7 mL Mouth Rinse BID  . [START ON 08/19/2014] aspirin  81 mg Oral Daily  . atorvastatin  40 mg Oral q1800  . clopidogrel  75 mg Oral Daily  . fenofibrate  160 mg Oral Q breakfast  . furosemide  40 mg Intravenous TID  . gabapentin  300 mg Oral QHS  . heparin  5,000 Units Subcutaneous 3 times per day  . insulin aspart  0-20 Units Subcutaneous TID WC  . insulin aspart  0-5 Units Subcutaneous QHS  . insulin glargine  20 Units Subcutaneous QHS  . ipratropium-albuterol  3 mL Nebulization Q4H  . isosorbide mononitrate  30 mg Oral Daily  . lipase/protease/amylase  36,000 Units Oral TID AC  . lisinopril  20 mg Oral BID  . loratadine  10 mg Oral Daily  . metoprolol tartrate  25 mg Oral BID  . pantoprazole  40 mg Oral Daily  . sodium chloride  3 mL Intravenous Q12H  . spironolactone  25 mg Oral Daily   Continuous Infusions:   Principal Problem:   Acute on chronic systolic CHF (congestive heart failure), NYHA class 3 Active Problems:   Acute respiratory failure with hypoxia   Elevated troponin   Essential hypertension   COPD (chronic obstructive pulmonary disease)   Congestive heart failure   Time spent 20 minutes

## 2014-08-18 NOTE — Progress Notes (Signed)
The patient was seen and examined, and I agree with the assessment and plan as documented above, with modifications as noted below. Pt has had a very good diuretic response with TID dosing of IV Lasix, which I would continue for today and perhaps switch to oral diuretics on 10/16. Will reduce ASA to 81 mg daily along with his Plavix 75 mg, as elevated troponin is representative of demand ischemia. BP remains elevated. I will start Imdur 30 mg daily to improve hemodynamics in the setting of congestive heart failure, which may also assist with BP reduction. Tobacco cessation was again reinforced.

## 2014-08-18 NOTE — Care Management Utilization Note (Signed)
UR completed 

## 2014-08-18 NOTE — Progress Notes (Signed)
Subjective:  Complaining that sugar is too low." Feels better when it is 300-400."  Objective:  Vital Signs in the last 24 hours: Temp:  [98.2 F (36.8 C)-98.6 F (37 C)] 98.4 F (36.9 C) (10/15 0520) Pulse Rate:  [84-95] 84 (10/15 0520) Resp:  [20] 20 (10/15 0520) BP: (142-158)/(68-83) 158/83 mmHg (10/15 0520) SpO2:  [93 %-98 %] 94 % (10/15 0727) Weight:  [212 lb 9.1 oz (96.42 kg)] 212 lb 9.1 oz (96.42 kg) (10/15 0520)  Intake/Output from previous day: 10/14 0701 - 10/15 0700 In: 720 [P.O.:720] Out: 5925 [Urine:5925] Intake/Output from this shift: Total I/O In: -  Out: 325 [Urine:325]  Physical Exam: NECK: Without JVD, HJR, or bruit LUNGS: Decreased breath sounds with diffuse wheezing HEART: Regular rate and rhythm,distant heart sounds, no murmur, gallop, rub, bruit, thrill, or heave EXTREMITIES: plus 1-2 edema with multiple ulcers/lesions  Telemetry: NSR  Lab Results:  Recent Labs  08/16/14 1351 08/17/14 0609  WBC 8.2 8.2  HGB 10.8* 10.9*  PLT 328 369    Recent Labs  08/17/14 0609 08/18/14 0600  NA 139 143  K 4.7 4.2  CL 96 97  CO2 34* 37*  GLUCOSE 258* 66*  BUN 19 23  CREATININE 1.01 1.14    Recent Labs  08/16/14 2234 08/17/14 0609  TROPONINI 0.45* 0.52*   Hepatic Function Panel  Recent Labs  08/17/14 0609  PROT 6.4  ALBUMIN 2.2*  AST 21  ALT 11  ALKPHOS 63  BILITOT <0.2*   No results found for this basename: CHOL,  in the last 72 hours No results found for this basename: PROTIME,  in the last 72 hours  Imaging:   Cardiac Studies:  Assessment/Plan:  1. Acute on chronic systolic heart failure: Good diuretic response with IV Lasix 40 mg TID. negative 5205 yesterday.Continue metoprolol, lisinopril (increase to 20 mg bid due to elevated BP), and spironolactone.   2. Elevated troponin/demand ischemia: Given unchanged ECG and absence of chest pain with only mild troponin elevation with relatively flat curve, I suspect this represents  demand ischemia in the setting of severe CAD. Continue ASA and Plavix.   3. Severe PAD: Scheduled for aortogram with bilateral lower extremity runoff and possible intervention with Dr. [name] (vascular surgery) this Friday. This will need to be postponed until patient is stabilized from a cardiac perspective.   4. COPD: Has smoked since age 30 claimed to have quit on admission. Continue Combivent, Xopenex, and Spiriva.   5. 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 Continue ASA 81 mg, Plavix, Lipitor, and beta blocker. May need long-acting nitrates going forward but will not start as of yet.   6. Type 2 diabetes: Continue glipizide, metformin, and Lantus.  Patient won't be compliant at D/C 7. Tobacco abuse: Cessation counseling provided.   8. Hyperlipidemia: Continue Lipitor 40 mg.     LOS: 2 days    Ermalinda Barrios 08/18/2014, 8:56 AM

## 2014-08-18 NOTE — Care Management Note (Unsigned)
    Page 1 of 1   08/18/2014     4:43:58 PM CARE MANAGEMENT NOTE 08/18/2014  Patient:  Stephen [name], Stephen [name]   Account Number:  [id number]  Date Initiated:  08/18/2014  Documentation initiated by:  Vladimir Creeks  Subjective/Objective Assessment:   Admitted with CHF. Pt is from home, with spouse, and 2 sons,  and will be returning home at D/C. He does not have O2 at home. Explained we will check O2 sats to see if he qualifies for O2 and will     Action/Plan:   set this up at D/C. He says  " 'They" will give everybody else anything, but won't give me anything". Explained we will do all the appropriate testings to try to qualify him for O2 at home. He C/O nocturnal dyspnea, at home and says he   Anticipated DC Date:  08/19/2014   Anticipated DC Plan:  Ord  CM consult      Choice offered to / List presented to:             Status of service:  In process, will continue to follow Medicare Important Message given?   (If response is "NO", the following Medicare IM given date fields will be blank) Date Medicare IM given:   Medicare IM given by:   Date Additional Medicare IM given:   Additional Medicare IM given by:    Discharge Disposition:    Per UR Regulation:  Reviewed for med. necessity/level of care/duration of stay  If discussed at [place] of Stay Meetings, dates discussed:    Comments:  08/18/14 1500 Vladimir Creeks RN/CM--- really needs the O2. Explained that he will certainly get the O2 if he qualfies. Pt takes the O2 off and says just forget about it, he will just do without it!. Reported to Janett Billow, his primary RN that he has O2 off. Will see pt again tomorrow

## 2014-08-19 ENCOUNTER — Ambulatory Visit (HOSPITAL_COMMUNITY): Admission: RE | Admit: 2014-08-19 | Payer: Medicare Other | Source: Ambulatory Visit | Admitting: Vascular Surgery

## 2014-08-19 ENCOUNTER — Encounter (HOSPITAL_COMMUNITY)
Admission: EM | Disposition: A | Discharge status: Home Health | Payer: Self-pay | Source: Home / Self Care | Attending: Family Medicine

## 2014-08-19 LAB — BASIC METABOLIC PANEL
Anion gap: 8 (ref 5–15)
BUN: 21 mg/dL (ref 6–23)
CALCIUM: 9.6 mg/dL (ref 8.4–10.5)
CO2: 38 mEq/L — ABNORMAL HIGH (ref 19–32)
Chloride: 94 mEq/L — ABNORMAL LOW (ref 96–112)
Creatinine, Ser: 1.01 mg/dL (ref 0.50–1.35)
GFR, EST AFRICAN AMERICAN: 84 mL/min — AB (ref >90)
GFR, EST NON AFRICAN AMERICAN: 73 mL/min — AB (ref >90)
Glucose, Bld: 250 mg/dL — ABNORMAL HIGH (ref 70–99)
POTASSIUM: 4.1 meq/L (ref 3.7–5.3)
SODIUM: 140 meq/L (ref 137–147)

## 2014-08-19 LAB — GLUCOSE, CAPILLARY
Glucose-Capillary: 244 mg/dL — ABNORMAL HIGH (ref 70–99)
Glucose-Capillary: 273 mg/dL — ABNORMAL HIGH (ref 70–99)

## 2014-08-19 SURGERY — ABDOMINAL AORTAGRAM
Anesthesia: LOCAL

## 2014-08-19 MED ORDER — ISOSORBIDE MONONITRATE ER 30 MG PO TB24: 30.0000 mg | ORAL_TABLET | Freq: Every day | ORAL | Status: DC

## 2014-08-19 MED ORDER — ASPIRIN EC 81 MG PO TBEC: 81.0000 mg | DELAYED_RELEASE_TABLET | Freq: Every day | ORAL | Status: DC

## 2014-08-19 MED ORDER — FUROSEMIDE 40 MG PO TABS: 40.0000 mg | ORAL_TABLET | Freq: Every day | ORAL | Status: DC

## 2014-08-19 MED ORDER — FUROSEMIDE 40 MG PO TABS
40.0000 mg | ORAL_TABLET | Freq: Every day | ORAL | Status: DC
Start: 2014-08-19 — End: 2014-08-19
  Administered 2014-08-19: 40 mg via ORAL
  Filled 2014-08-19: qty 1

## 2014-08-19 NOTE — Progress Notes (Signed)
SUBJECTIVE: Pt continues to complain of leg pain which is chronic and due to severe PVD. Denies chest pain. Thinks breathing has improved.     Intake/Output Summary (Last 24 hours) at 08/19/14 1032 Last data filed at 08/19/14 0901  Gross per 24 hour  Intake   1200 ml  Output   4475 ml  Net  -3275 ml    Current Facility-Administered Medications  Medication Dose Route Frequency Provider Last Rate Last Dose  . albuterol (PROVENTIL) (2.5 MG/3ML) 0.083% nebulizer solution 2.5 mg  2.5 mg Nebulization Q2H PRN Samuella Cota, MD      . antiseptic oral rinse (CPC / CETYLPYRIDINIUM CHLORIDE 0.05%) solution 7 mL  7 mL Mouth Rinse BID Samuella Cota, MD   7 mL at 08/19/14 1000  . aspirin chewable tablet 81 mg  81 mg Oral Daily Herminio Commons, MD   81 mg at 08/19/14 0905  . atorvastatin (LIPITOR) tablet 40 mg  40 mg Oral q1800 Herminio Commons, MD   40 mg at 08/18/14 1638  . budesonide-formoterol (SYMBICORT) 160-4.5 MCG/ACT inhaler 1-2 puff  1-2 puff Inhalation BID PRN Doree Albee, MD   2 puff at 08/19/14 0737  . clopidogrel (PLAVIX) tablet 75 mg  75 mg Oral Daily Doree Albee, MD   75 mg at 08/19/14 0905  . fenofibrate tablet 160 mg  160 mg Oral Q breakfast Doree Albee, MD   160 mg at 08/19/14 0903  . furosemide (LASIX) injection 40 mg  40 mg Intravenous TID Herminio Commons, MD   40 mg at 08/19/14 0904  . gabapentin (NEURONTIN) capsule 300 mg  300 mg Oral QHS Nimish C Gosrani, MD   300 mg at 08/18/14 2110  . heparin injection 5,000 Units  5,000 Units Subcutaneous 3 times per day Doree Albee, MD   5,000 Units at 08/19/14 0546  . HYDROcodone-acetaminophen (NORCO) 7.5-325 MG per tablet 1 tablet  1 tablet Oral TID PRN Doree Albee, MD   1 tablet at 08/19/14 0546  . insulin aspart (novoLOG) injection 0-20 Units  0-20 Units Subcutaneous TID WC Doree Albee, MD   7 Units at 08/19/14 0903  . insulin aspart (novoLOG) injection 0-5 Units  0-5 Units  Subcutaneous QHS Doree Albee, MD   2 Units at 08/18/14 2158  . insulin glargine (LANTUS) injection 15 Units  15 Units Subcutaneous QHS Samuella Cota, MD   15 Units at 08/18/14 2157  . ipratropium-albuterol (DUONEB) 0.5-2.5 (3) MG/3ML nebulizer solution 3 mL  3 mL Nebulization Q4H Nimish C Gosrani, MD   3 mL at 08/19/14 0737  . isosorbide mononitrate (IMDUR) 24 hr tablet 30 mg  30 mg Oral Daily Herminio Commons, MD   30 mg at 08/19/14 0905  . lipase/protease/amylase (CREON) capsule 24,000 Units  24,000 Units Oral PRN Doree Albee, MD      . lipase/protease/amylase (CREON) capsule 36,000 Units  36,000 Units Oral TID AC Doree Albee, MD   36,000 Units at 08/19/14 0904  . lisinopril (PRINIVIL,ZESTRIL) tablet 20 mg  20 mg Oral BID Herminio Commons, MD   20 mg at 08/19/14 0905  . loratadine (CLARITIN) tablet 10 mg  10 mg Oral Daily Doree Albee, MD   10 mg at 08/19/14 0905  . metoprolol tartrate (LOPRESSOR) tablet 25 mg  25 mg Oral BID Doree Albee, MD   25 mg at 08/19/14 0905  . nitroGLYCERIN (NITROSTAT)  SL tablet 0.4 mg  0.4 mg Sublingual Q5 Min x 3 PRN Nimish C Gosrani, MD      . ondansetron (ZOFRAN) tablet 4 mg  4 mg Oral Q6H PRN Nimish C Gosrani, MD       Or  . ondansetron (ZOFRAN) injection 4 mg  4 mg Intravenous Q6H PRN Nimish C Gosrani, MD      . pantoprazole (PROTONIX) EC tablet 40 mg  40 mg Oral Daily Nimish C Anastasio Champion, MD   40 mg at 08/19/14 0904  . sodium chloride 0.9 % injection 3 mL  3 mL Intravenous Q12H Nimish C Anastasio Champion, MD   3 mL at 08/19/14 0910  . spironolactone (ALDACTONE) tablet 25 mg  25 mg Oral Daily Doree Albee, MD   25 mg at 08/19/14 0906    Filed Vitals:   08/18/14 2032 08/19/14 0455 08/19/14 0511 08/19/14 0737  BP: 135/62  159/69   Pulse: 93  83   Temp: 98.7 F (37.1 C)  98.1 F (36.7 C)   TempSrc: Oral  Oral   Resp: 20  20   Height:      Weight:   215 lb 3.4 oz (97.62 kg)   SpO2: 92% 93% 98% 80%    PHYSICAL EXAM General: NAD    Neck: JVP 9 cm H2O, no thyromegaly or thyroid nodule.  Lungs: Diminished breath sounds at bases, inspiratory wheezes with prolonged expiratory phase b/l, no rales.  CV: Nondisplaced PMI. Regular rate and rhythm, normal S1/split S2, no S3/S4, no murmur. 1+ pitting pretibial edema b/l to knees, b/l erythema of LE's, absent pedal pulses.  Abdomen: Soft, nontender, mild distention.  Skin: Ulcers on b/l LE's.  Neurologic: Alert and oriented x 3.  Psych: Normal affect.  Extremities: No clubbing or cyanosis.  HEENT: Normal.    LABS: Basic Metabolic Panel:  Recent Labs  08/18/14 0600 08/19/14 0609  NA 143 140  K 4.2 4.1  CL 97 94*  CO2 37* 38*  GLUCOSE 66* 250*  BUN 23 21  CREATININE 1.14 1.01  CALCIUM 9.6 9.6   Liver Function Tests:  Recent Labs  08/16/14 1351 08/17/14 0609  AST 21 21  ALT 13 11  ALKPHOS 66 63  BILITOT <0.2* <0.2*  PROT 6.4 6.4  ALBUMIN 2.3* 2.2*   No results found for this basename: LIPASE, AMYLASE,  in the last 72 hours CBC:  Recent Labs  08/16/14 1351 08/17/14 0609  WBC 8.2 8.2  NEUTROABS 6.6  --   HGB 10.8* 10.9*  HCT 34.0* 33.9*  MCV 92.4 91.1  PLT 328 369   Cardiac Enzymes:  Recent Labs  08/16/14 1650 08/16/14 2234 08/17/14 0609  TROPONINI 0.43* 0.45* 0.52*   BNP: No components found with this basename: POCBNP,  D-Dimer: No results found for this basename: DDIMER,  in the last 72 hours Hemoglobin A1C:  Recent Labs  08/17/14 0609  HGBA1C 9.3*   Fasting Lipid Panel: No results found for this basename: CHOL, HDL, LDLCALC, TRIG, CHOLHDL, LDLDIRECT,  in the last 72 hours Thyroid Function Tests: No results found for this basename: TSH, T4TOTAL, FREET3, T3FREE, THYROIDAB,  in the last 72 hours Anemia Panel: No results found for this basename: VITAMINB12, FOLATE, FERRITIN, TIBC, IRON, RETICCTPCT,  in the last 72 hours  RADIOLOGY: Dg Chest 2 View  07/22/2014   CLINICAL DATA:  Shortness of breath and cough.  Hypoglycemia.   EXAM: CHEST  2 VIEW  COMPARISON:  Chest radiograph performed 07/10/2014  FINDINGS: The lungs  are well-aerated. Small bilateral pleural effusions are seen. Increased interstitial markings may reflect mild interstitial edema. Vascular congestion is noted. No pneumothorax is identified.  The heart is borderline normal in size. The patient is status post median sternotomy, with evidence of prior CABG. No acute osseous abnormalities are seen.  IMPRESSION: Small bilateral pleural effusions seen. Vascular congestion noted. Increased interstitial markings may reflect mild interstitial edema.   Electronically Signed   By: Garald Balding M.D.   On: 07/22/2014 01:14   Dg Chest Portable 1 View  08/16/2014   CLINICAL DATA:  Shortness of breath for 3 days.  EXAM: PORTABLE CHEST - 1 VIEW  COMPARISON:  07/22/2014  FINDINGS: The patient has developed moderate bilateral pleural effusions. Slight pulmonary vascular congestion. No osseous abnormality. CABG.  IMPRESSION: New moderate bilateral pleural effusions with pulmonary vascular congestion. Findings are consistent with congestive heart failure.   Electronically Signed   By: Rozetta Nunnery M.D.   On: 08/16/2014 14:36      ASSESSMENT AND PLAN: 1. Acute on chronic systolic heart failure: 9 liters output in past 48 hrs with IV Lasix 40 mg TID. Will switch to oral Lasix 40 mg daily. Continue metoprolol, lisinopril, nitrates, and spironolactone.  2. Elevated troponin/demand ischemia: Given unchanged ECG and absence of chest pain with only mild troponin elevation with relatively flat curve, I suspect this represents demand ischemia in the setting of severe CAD. Continue ASA and Plavix.  3. Severe PAD: Scheduled for aortogram with bilateral lower extremity runoff and possible intervention with Dr. [name] (vascular surgery) today. This will need to be postponed until patient is stabilized from a cardiac perspective.  4. COPD: Has smoked since age 72 claimed to have quit on  admission. Continue Combivent, Xopenex, and Spiriva.  5. 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 Continue ASA 81 mg, Plavix, Lipitor, and beta blocker.  6. Type 2 diabetes: Continue glipizide, metformin, and Lantus.  7. Tobacco abuse: Cessation counseling provided.  8. Hyperlipidemia: Continue Lipitor 40 mg.   Kate Sable, M.D., F.A.C.C.

## 2014-08-19 NOTE — Discharge Summary (Signed)
Physician Discharge Summary  Stephen [name]:[id number] DOB: 04-Jan-1941 DOA: 08/16/2014  PCP: Reynold Bowen, NT  Admit date: 08/16/2014 Discharge date: 08/19/2014  Recommendations for Outpatient Follow-up:  1. Systolic CHF 2. COPD, newly started on home oxygen 3. Tobacco dependence, reports he has quit 4. Severe chronic PAD   5. [place] RN, home oxygen for hypoxia   Follow-up Information   Follow up with [facility]. (They will call you)    Contact information:   [address] [place] [address] [phone]       Follow up with [facility]. (They will call you)    Contact information:   [address] [place] [address] [phone]      Discharge Diagnoses:  1. Acute hypoxic respiratory failure secondary to CHF, underlying COPD 2. Acute on chrnoic systolic CHF 3. Elevated troponin/demand ischemi 4. COPD 5. Tobacco dependence 6. DM 7. Severe PAD  Discharge Condition: improved Disposition: home with [facility]  Diet recommendation: heart healthy diabetic diet.  Filed Weights   08/17/14 0624 08/18/14 0520 08/19/14 0511  Weight: 95.482 kg (210 lb 8 oz) 96.42 kg (212 lb 9.1 oz) 97.62 kg (215 lb 3.4 oz)    History of present illness:  73 year old man presented with DOE, leg swellling; PMH CHF, CABG, COPD. Admitted for acute on chronic systolic CHF.  Hospital Course:  Stephen [name] was seen in consultation with cardiology and responded very well to aggressive diuresis with I/O -10L. Weights unreliable. Respiratory status improved but remained hypoxic, presumably secondary to underlying COPD and CHF. Hospitalization uncomplicated. Plan for home with [facility], home oxygen with close outpatient follow-up.  1. Acute hypoxic resp failure secondary to CHF, underlying COPD. Much improved, will go home on oxygen.  2. Acute on chronic systolic heart failure, excellent diuresis, changed to oral diuretics. 3. Elevated troponin/demand  ischemia, stable, no further evaluation per cardiology. 4. COPD, appears stable. Suspect chronic hypoxic resp failure. 5. Bilateral pleural effusions asymptomatic, secondary to CHF, anticipate improvement with ongoing diuresis. 6. H/o CAD/CABG. Continue ASA and Plavix. 7. Tobacco dependence, ongoing smoking--reports quitting this admission. 8. DM, stable, no hypoglycemia last 24 hours. 9. Non-healing ulcers left leg secondary to severe chronic PAD, has angiogram scheduled for 10/16.  Consultants:  Cardiology  Procedures: none   Discharge Instructions  Discharge Instructions   (HEART FAILURE PATIENTS) Call MD:  Anytime you have any of the following symptoms: 1) 3 pound weight gain in 24 hours or 5 pounds in 1 week 2) shortness of breath, with or without a dry hacking cough 3) swelling in the hands, feet or stomach 4) if you have to sleep on extra pillows at night in order to breathe.    Complete by:  As directed      Diet - low sodium heart healthy    Complete by:  As directed      Diet Carb Modified    Complete by:  As directed      Discharge instructions    Complete by:  As directed   Call physician or seek immediate medical attention for shortness of breath, increased swelling, weight gain more than 2-3 pounds or worsening of condition.     Face-to-face encounter (required for Medicare/Medicaid patients)    Complete by:  As directed   I GOODRICH, DANIEL certify that this patient is under my care and that I, or a nurse practitioner or physician's assistant working with me, had a face-to-face encounter that meets  the physician face-to-face encounter requirements with this patient on 08/19/2014. The encounter with the patient was in whole, or in part for the following medical condition(s) which is the primary reason for home health care (List medical condition): acute systolic CHF  The encounter with the patient was in whole, or in part, for the following medical condition, which is the  primary reason for home health care:  acute systolic CHF  I certify that, based on my findings, the following services are medically necessary home health services:   Physical therapy Nursing    My clinical findings support the need for the above services:  Shortness of breath with activity  Further, I certify that my clinical findings support that this patient is homebound due to:  Shortness of Breath with activity  Reason for Medically Necessary Home Health Services:  Skilled Nursing- Teaching of Disease Process/Symptom Management     Increase activity slowly    Complete by:  As directed           Current Discharge Medication List    START taking these medications   Details  furosemide (LASIX) 40 MG tablet Take 1 tablet (40 mg total) by mouth daily. Qty: 30 tablet, Refills: 0    isosorbide mononitrate (IMDUR) 30 MG 24 hr tablet Take 1 tablet (30 mg total) by mouth daily. Qty: 30 tablet, Refills: 0      CONTINUE these medications which have CHANGED   Details  aspirin EC 81 MG tablet Take 1 tablet (81 mg total) by mouth daily.      CONTINUE these medications which have NOT CHANGED   Details  albuterol-ipratropium (COMBIVENT) 18-103 MCG/ACT inhaler Inhale 1-2 puffs into the lungs every 6 (six) hours as needed for wheezing or shortness of breath.    clopidogrel (PLAVIX) 75 MG tablet Take 75 mg by mouth daily.    fenofibrate 160 MG tablet Take 160 mg by mouth daily with breakfast.     gabapentin (NEURONTIN) 100 MG capsule Take 300 mg by mouth at bedtime.     glipiZIDE (GLUCOTROL XL) 2.5 MG 24 hr tablet Take 2.5 mg by mouth 2 (two) times daily.    HYDROcodone-acetaminophen (NORCO) 7.5-325 MG per tablet Take 1 tablet by mouth 3 (three) times daily as needed for moderate pain.     insulin glargine (LANTUS) 100 UNIT/ML injection Inject 20 Units into the skin daily.    levalbuterol (XOPENEX) 1.25 MG/3ML nebulizer solution Take 0.63 mg by nebulization every 4 (four) hours as  needed for wheezing or shortness of breath. Qty: 72 mL, Refills: 0    lisinopril (PRINIVIL,ZESTRIL) 20 MG tablet Take 20 mg by mouth daily.    loratadine (CLARITIN) 10 MG tablet Take 10 mg by mouth daily.    metoprolol tartrate (LOPRESSOR) 25 MG tablet Take 25 mg by mouth 2 (two) times daily.     Pancrelipase, Lip-Prot-Amyl, (CREON) 24000 UNITS CPEP Take 2-3 capsules by mouth See admin instructions. Take 3 capsules with all meals and 2 with snacks    pantoprazole (PROTONIX) 40 MG tablet Take 1 tablet (40 mg total) by mouth daily. Qty: 30 tablet, Refills: 1    spironolactone (ALDACTONE) 25 MG tablet Take 25 mg by mouth daily.      SYMBICORT 160-4.5 MCG/ACT inhaler Inhale 1-2 puffs into the lungs 2 (two) times daily as needed (shorntess of breath).     tiotropium (SPIRIVA) 18 MCG inhalation capsule Place 18 mcg into inhaler and inhale daily.    nitroGLYCERIN (NITROSTAT) 0.4 MG  SL tablet Place 0.4 mg under the tongue every 5 (five) minutes x 3 doses as needed for chest pain.       STOP taking these medications     doxycycline (VIBRA-TABS) 100 MG tablet      metFORMIN (GLUCOPHAGE) 1000 MG tablet        Allergies  Allergen Reactions  . Levaquin [Levofloxacin] Hives    The results of significant diagnostics from this hospitalization (including imaging, microbiology, ancillary and laboratory) are listed below for reference.    Significant Diagnostic Studies: Dg Chest Portable 1 View  08/16/2014   CLINICAL DATA:  Shortness of breath for 3 days.  EXAM: PORTABLE CHEST - 1 VIEW  COMPARISON:  07/22/2014  FINDINGS: The patient has developed moderate bilateral pleural effusions. Slight pulmonary vascular congestion. No osseous abnormality. CABG.  IMPRESSION: New moderate bilateral pleural effusions with pulmonary vascular congestion. Findings are consistent with congestive heart failure.   Electronically Signed   By: Rozetta Nunnery M.D.   On: 08/16/2014 14:36   Labs: Basic Metabolic  Panel:  Recent Labs Lab 08/16/14 1351 08/16/14 1534 08/16/14 2107 08/17/14 0609 08/18/14 0600 08/19/14 0609  NA 136*  --   --  139 143 140  K 5.4* 5.2  --  4.7 4.2 4.1  CL 95*  --   --  96 97 94*  CO2 32  --   --  34* 37* 38*  GLUCOSE 435*  --  494* 258* 66* 250*  BUN 17  --   --  19 23 21   CREATININE 1.19  --   --  1.01 1.14 1.01  CALCIUM 9.1  --   --  9.3 9.6 9.6   Liver Function Tests:  Recent Labs Lab 08/16/14 1351 08/17/14 0609  AST 21 21  ALT 13 11  ALKPHOS 66 63  BILITOT <0.2* <0.2*  PROT 6.4 6.4  ALBUMIN 2.3* 2.2*   CBC:  Recent Labs Lab 08/16/14 1351 08/17/14 0609  WBC 8.2 8.2  NEUTROABS 6.6  --   HGB 10.8* 10.9*  HCT 34.0* 33.9*  MCV 92.4 91.1  PLT 328 369   Cardiac Enzymes:  Recent Labs Lab 08/16/14 1351 08/16/14 1650 08/16/14 2234 08/17/14 0609  TROPONINI 0.47* 0.43* 0.45* 0.52*     Recent Labs  07/21/14 2348 08/16/14 1351 08/17/14 0609  PROBNP 3366.0* 6508.0* 9230.0*   CBG:  Recent Labs Lab 08/18/14 1136 08/18/14 1658 08/18/14 2035 08/19/14 0741 08/19/14 1200  GLUCAP 195* 144* 237* 244* 273*    Principal Problem:   Acute on chronic systolic CHF (congestive heart failure), NYHA class 3 Active Problems:   Acute respiratory failure with hypoxia   Elevated troponin   Essential hypertension   COPD (chronic obstructive pulmonary disease)   Congestive heart failure   Time coordinating discharge: 35 minutes  Signed:  [name], MD [facility] 08/19/2014, 3:54 PM

## 2014-08-19 NOTE — Progress Notes (Addendum)
Patient O2 sats 84% room air at rest.

## 2014-08-19 NOTE — Progress Notes (Signed)
Patient with orders to be discharge home. Discharge instructions given, patient verbalized understanding. Patient stable. Patient left with family in private vehicle.  

## 2014-08-19 NOTE — Progress Notes (Signed)
PROGRESS NOTE  Stephen [name] QPY:[id number] DOB: 1941/01/17 DOA: 08/16/2014 PCP: Reynold Bowen, NT  Summary: 73 year old man presented with DOE, leg swellling; PMH CHF, CABG, COPD. Admitted for acute on chronic systolic CHF.  Assessment/Plan: 1. Acute hypoxic resp failure secondary to CHF, underlying COPD. Much improved, will go home on oxygen.  2. Acute on chronic systolic heart failure, excellent diuresis, changed to oral diuretics.. 3. Elevated troponin/demand ischemia, stable, no further evaluation per cardiology. 4. COPD, appears stable. Suspect chronic hypoxic resp failure. 5. Bilateral pleural effusions asymptomatic, secondary to CHF 6. H/o CAD/CABG. Continue ASA and Plavix. 7. Tobacco dependence, ongoing smoking--reports quitting this admission. 8. DM, stable, no hypoglycemia last 24 hours. 9. Non-healing ulcers left leg secondary to severe chronic PAD, has angiogram scheduled for 10/16.   Excellent diuresis, down 10 L; plan home today on oral Lasix 40mg  daily, continue metoprolol, lisinopril, nitrates, spironolactone  Home oxygen for COPD, CHF  [name], MD  [facility]  Pager [phone] If 7PM-7AM, please contact night-coverage at [email or web address], password [facility] 08/19/2014, 12:28 PM  LOS: 3 days   Consultants:  Cardiology   Procedures:    Antibiotics:    HPI/Subjective: No issues overnight, changed to oral Lasix. Hypoxic on room air.  Feeling better, breathing better, wants to go home.  Objective: Filed Vitals:   08/19/14 0737 08/19/14 0905 08/19/14 0910 08/19/14 1103  BP:      Pulse:      Temp:      TempSrc:      Resp:      Height:      Weight:      SpO2: 80% 84% 91% 94%    Intake/Output Summary (Last 24 hours) at 08/19/14 1228 Last data filed at 08/19/14 1043  Gross per 24 hour  Intake   1200 ml  Output   3900 ml  Net  -2700 ml     Filed Weights   08/17/14 0624 08/18/14 0520 08/19/14 0511  Weight: 95.482 kg (210 lb 8 oz)  96.42 kg (212 lb 9.1 oz) 97.62 kg (215 lb 3.4 oz)    Exam:     Afebrile, VSS, 99% on 2L  Appears calm and comfortable  Speech fluent and clear  Alert, grossly normal mentation  CV RRR no m/r/g. 1+ BLE edema  Resp improved air movement, no frank w/r/r. Normal resp effort  Data Reviewed:  UOP 5050. -10.6 L since admission  CBG stable  BMP stable  Scheduled Meds: . antiseptic oral rinse  7 mL Mouth Rinse BID  . aspirin  81 mg Oral Daily  . atorvastatin  40 mg Oral q1800  . clopidogrel  75 mg Oral Daily  . fenofibrate  160 mg Oral Q breakfast  . furosemide  40 mg Oral Daily  . gabapentin  300 mg Oral QHS  . heparin  5,000 Units Subcutaneous 3 times per day  . insulin aspart  0-20 Units Subcutaneous TID WC  . insulin aspart  0-5 Units Subcutaneous QHS  . insulin glargine  15 Units Subcutaneous QHS  . ipratropium-albuterol  3 mL Nebulization Q4H  . isosorbide mononitrate  30 mg Oral Daily  . lipase/protease/amylase  36,000 Units Oral TID AC  . lisinopril  20 mg Oral BID  . loratadine  10 mg Oral Daily  . metoprolol tartrate  25 mg Oral BID  . pantoprazole  40 mg Oral Daily  . sodium chloride  3 mL Intravenous Q12H  . spironolactone  25 mg Oral Daily  Continuous Infusions:   Principal Problem:   Acute on chronic systolic CHF (congestive heart failure), NYHA class 3 Active Problems:   Acute respiratory failure with hypoxia   Elevated troponin   Essential hypertension   COPD (chronic obstructive pulmonary disease)   Congestive heart failure

## 2014-08-19 NOTE — Progress Notes (Signed)
Inpatient Diabetes Program Recommendations  AACE/ADA: New Consensus Statement on Inpatient Glycemic Control (2013)  Target Ranges:  Prepandial:   less than 140 mg/dL      Peak postprandial:   less than 180 mg/dL (1-2 hours)      Critically ill patients:  140 - 180 mg/dL   Reason for Visit: Hyperglycemia  Results for Stephen [name], Stephen [name] (MRN [id number]) as of 08/19/2014 14:54  Ref. Range 08/18/2014 20:35 08/19/2014 07:41 08/19/2014 12:00  Glucose-Capillary Latest Range: 70-99 mg/dL 237 (H) 244 (H) 273 (H)   Eating 100% of meals. FBS >180 mg/dL and post-prandial blood sugars elevated. Needs insulin adjustments.   Inpatient Diabetes Program Recommendations Insulin - Basal: Increase Lantus to 20 units QHS Insulin - Meal Coverage: If blood sugars consistently > 180 mg/dL, add Novolog 4 units tidwc for meal coverage insulin if pt eats >50% meal  Note: Will continue to follow. Thank you. Lorenda Peck, RD, LDN, CDE Inpatient Diabetes Coordinator [phone]

## 2014-08-22 ENCOUNTER — Other Ambulatory Visit: Payer: Self-pay

## 2014-08-26 ENCOUNTER — Encounter (HOSPITAL_COMMUNITY): Payer: Self-pay | Admitting: Pharmacy Technician

## 2014-08-26 NOTE — Progress Notes (Signed)
HPI: [name] is a 73 year old patient of, Dr. Bronson Ing, we are following for ongoing assessment and management of CAD, with history of CABG, severe PVD, ongoing tobacco abuse, hypertension, with other history to include, COPD, diabetes, and hyperlipidemia. The patient was seen on consultation in the hospital on 08/16/2014 after having a syncopal episode. The patient EKG demonstrated a chronic left bundle branch block was in the left ear septal infarct.  The patient was admitted with acute hypoxic respiratory failure, secondary to CHF, and underlying COPD. He is diuresed with a weight loss down to 200 and 15 pounds. His bilateral pleural effusions were monitored felt to be related secondary to CHF. He was continued on aspirin, Plavix. He is advised on smoking cessation. He is here for post hospital followup.   He comes today with continued issues with breathing, and significant LEE up to his knees with erythema. He continues to eat salty foods. He has pain in his legs chronically.   Allergies  Allergen Reactions  . Levaquin [Levofloxacin] Hives    Current Outpatient Prescriptions  Medication Sig Dispense Refill  . albuterol-ipratropium (COMBIVENT) 18-103 MCG/ACT inhaler Inhale 1-2 puffs into the lungs every 6 (six) hours as needed for wheezing or shortness of breath.      [name] aspirin 325 MG tablet Take 325 mg by mouth daily.      [name] atorvastatin (LIPITOR) 40 MG tablet Take 40 mg by mouth daily.      . clopidogrel (PLAVIX) 75 MG tablet Take 75 mg by mouth daily.      [name] doxycycline (VIBRAMYCIN) 100 MG capsule Take 100 mg by mouth 2 (two) times daily.      . fenofibrate 160 MG tablet Take 160 mg by mouth daily with breakfast.       . furosemide (LASIX) 20 MG tablet Take 40 mg by mouth daily.      [name] gabapentin (NEURONTIN) 100 MG capsule Take 300 mg by mouth at bedtime.       [name] glipiZIDE (GLUCOTROL XL) 2.5 MG 24 hr tablet Take 2.5 mg by mouth daily.       [name] HYDROcodone-acetaminophen  (NORCO/VICODIN) 5-325 MG per tablet Take 1 tablet by mouth 4 (four) times daily as needed for moderate pain.      [name] insulin glargine (LANTUS) 100 UNIT/ML injection Inject 20 Units into the skin daily.      . isosorbide mononitrate (IMDUR) 30 MG 24 hr tablet Take 1 tablet (30 mg total) by mouth daily.  30 tablet  0  . levalbuterol (XOPENEX) 1.25 MG/3ML nebulizer solution Take 0.63 mg by nebulization every 4 (four) hours as needed for wheezing or shortness of breath.  72 mL  0  . lisinopril (PRINIVIL,ZESTRIL) 20 MG tablet Take 40 mg by mouth daily.       [name] loratadine (CLARITIN) 10 MG tablet Take 10 mg by mouth daily.      . metFORMIN (GLUCOPHAGE) 1000 MG tablet Take 1,000 mg by mouth daily.      . metoprolol tartrate (LOPRESSOR) 25 MG tablet Take 25 mg by mouth 2 (two) times daily.       . nitroGLYCERIN (NITROSTAT) 0.4 MG SL tablet Place 0.4 mg under the tongue every 5 (five) minutes x 3 doses as needed for chest pain.       . Pancrelipase, Lip-Prot-Amyl, (CREON) 24000 UNITS CPEP Take 48,000-72,000 Units by mouth See admin instructions. Take 72000 units 3 times daily with all meals and take 48000 units with snacks      .  pantoprazole (PROTONIX) 40 MG tablet Take 1 tablet (40 mg total) by mouth daily.  30 tablet  1  . spironolactone (ALDACTONE) 25 MG tablet Take 25 mg by mouth daily.        . SYMBICORT 160-4.5 MCG/ACT inhaler Inhale 1-2 puffs into the lungs 2 (two) times daily as needed (shorntess of breath).       . tiotropium (SPIRIVA) 18 MCG inhalation capsule Place 18 mcg into inhaler and inhale daily.       No current facility-administered medications for this visit.    Past Medical History  Diagnosis Date  . Diabetes mellitus   . Hypertension   . Pancreatitis   . Neuropathy   . Hyperlipemia   . Peripheral vascular disease     a. 10/2013: severely abnormal ABIs, for OP evaluation.  [name] GERD (gastroesophageal reflux disease)   . S/P colonoscopy July 2003    hyperplastic polyp,  diverticulosis, anal papilla and internal hemorrhoids r  . S/P endoscopy 2003    Dr. Braulio Bosch: erosive reflux esophagitis , PUD  . PUD (peptic ulcer disease)     diagnosed via EGD by Dr. Braulio Bosch at [place]  . Diverticulosis   . Hemorrhoids   . Coronary artery disease      a. BMS to LAD 1999. b. HSRA to LAD and stent to RCA 2007. c. NSTEMI/CABG x 3 in 04/2010. d. NSTEMI 10/2013 - secondary to demand ischemia in setting of COPD exacerbation with severe underlying CAD - cath with 3/3 patent grafts, for medical therapy.   [name] COPD (chronic obstructive pulmonary disease)     a. Ongoing tobacco abuse.  . Arthritis   . Neutropenia     a. Dx 10/2013 on labs - instructed to f/u PCP.  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   . Ischemic cardiomyopathy     a. previously EF 30% in 2011. b. 10/2013: EF 45-50% by echo.  . Alcoholism     a. Remote alcoholism  . 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    Past Surgical History  Procedure Laterality Date  . Coronary artery bypass graft  June 2011    X3  . Right inguinal hernia repair    . Cholecystectomy      3-4 years ago  . Appendectomy      age 61  . Cataracts    . Colonoscopy  05/12/2002    Dr. [name]- diverticulosis, anal papilla, internal hemorrhoids, rectal polyps  . [name] dilation  04/16/2012    Schatzki's ring-status post dilation and disruption/ as described above. Grade 1 esophageal varices. Small hiatal hernia.  [name] dilation  04/16/2012    Procedure: [name] DILATION;  Surgeon: Daneil Dolin, MD;  Location: [facility];  Service: Endoscopy;  Laterality: N/A;  . Colonoscopy  08/10/2012    Procedure: COLONOSCOPY;  Surgeon: Daneil Dolin, MD;  Location: [facility];  Service: Endoscopy;  Laterality: N/A;  9:45 needs 30 mins extra /have Glucagon on hand    ROS: Review of systems complete and found to be  negative unless listed above  PHYSICAL EXAM BP 110/68  Pulse 80  Ht 5\' 10"  (1.778 m)  Wt 192 lb (87.091 kg)  BMI 27.55 kg/m2 General: Well developed, well nourished, in no acute distress Head: Eyes PERRLA, No xanthomas.   Normal cephalic and atramatic  Lungs: Bilateral bibasilar  crackles with some inspiratory wheezing. Heart: HRRR S1 S2, without MRG.  Pulses are 2+ & equal.            No carotid bruit. No JVD.  No abdominal bruits. No femoral bruits. Abdomen: Bowel sounds are positive, abdomen soft and non-tender without masses or                  Hernia's noted. Msk:  Back normal, wheelchair bound. Normal strength and tone for age. Extremities: No clubbing, cyanosis 2+ pitting edema to the knees with erythema and plaqued sores on the left.No DP. Palpated.  Neuro: Alert and oriented X 3. Psych:  Good affect, responds appropriately   ASSESSMENT AND PLAN

## 2014-08-29 ENCOUNTER — Ambulatory Visit (INDEPENDENT_AMBULATORY_CARE_PROVIDER_SITE_OTHER): Payer: Medicare Other | Admitting: Adult Health

## 2014-08-29 ENCOUNTER — Encounter: Payer: Self-pay | Admitting: Adult Health

## 2014-08-29 VITALS — BP 110/68 | HR 80 | Ht 70.0 in | Wt 192.0 lb

## 2014-08-29 DIAGNOSIS — Z720 Tobacco use: Secondary | ICD-10-CM

## 2014-08-29 DIAGNOSIS — E8770 Fluid overload, unspecified: Secondary | ICD-10-CM

## 2014-08-29 DIAGNOSIS — I259 Chronic ischemic heart disease, unspecified: Secondary | ICD-10-CM

## 2014-08-29 DIAGNOSIS — F17210 Nicotine dependence, cigarettes, uncomplicated: Secondary | ICD-10-CM

## 2014-08-29 DIAGNOSIS — I5022 Chronic systolic (congestive) heart failure: Secondary | ICD-10-CM

## 2014-08-29 DIAGNOSIS — I70219 Atherosclerosis of native arteries of extremities with intermittent claudication, unspecified extremity: Secondary | ICD-10-CM

## 2014-08-29 MED ORDER — METOLAZONE 2.5 MG PO TABS: 2.5000 mg | ORAL_TABLET | Freq: Every day | ORAL | Status: DC

## 2014-08-29 NOTE — Assessment & Plan Note (Addendum)
Significant fluid overload noted with LEE and erythema. 2+ edema up to the knees. He is eating salty foods to include sausage, and adding salt to his foods. I have counseled him on this. He will receive a low sodium diet.   I will give Rx for metolazone 2.5 mg to take x!, increase lasix to 40 mg BID for two days. Then go back to daily doses, He is unable to stand to weigh himself. He is to keep legs elevated. I will see him in a week.  BMET.

## 2014-08-29 NOTE — Assessment & Plan Note (Signed)
Denies complaints of chest pain or palpitations. He is not at all active.  Continue BB, ASA, plavix, and ACE.

## 2014-08-29 NOTE — Assessment & Plan Note (Signed)
He is due to see [facility] to discuss surgical intervention of aorta and LEE arteries due to severe disease. He will need further compensation of his CHF before safely being able to undergo procedures.

## 2014-08-29 NOTE — Assessment & Plan Note (Signed)
States he has gone back to smoking. I have discussed cessation with him. He says he will try to cut down.

## 2014-08-29 NOTE — Patient Instructions (Addendum)
Your physician recommends that you schedule a follow-up appointment in: 1 month  Your physician has recommended you make the following change in your medication:   TAKE LASIX 2 TIMES DAILY FOR THE NEXT 2 DAYS  TAKE METOLAZONE 2.5 MG TOMORROW ONLY  ON Thursday GO BACK TO YOUR REGULAR DAILY DOSE OF LASIX  WE HAVE GIVEN YOU A LOW SODIUM DIET TO FOLLOW   Your physician recommends that you return for lab work in: 1 WEEK  Thank you for choosing [facility]!!

## 2014-08-29 NOTE — Progress Notes (Deleted)
Name: Stephen [name]    DOB: 11/21/1940  Age: 73 y.o.  MR#: [id number]       PCP:  Reynold Bowen, NT      Insurance: Payor: [employer] MEDICARE / Plan: AARP MEDICARE COMPLETE / Product Type: *No Product type* /   CC:    Chief Complaint  Patient presents with  . Congestive Heart Failure    Systolic  . PAD    VS Filed Vitals:   08/29/14 1447  BP: 110/68  Pulse: 80  Height: 5\' 10"  (1.778 m)  Weight: 192 lb (87.091 kg)    Weights Current Weight  08/29/14 192 lb (87.091 kg)  08/19/14 215 lb 3.4 oz (97.62 kg)  08/19/14 215 lb 3.4 oz (97.62 kg)    Blood Pressure  BP Readings from Last 3 Encounters:  08/29/14 110/68  08/19/14 154/66  08/19/14 154/66     Admit date:  (Not on file) Last encounter with RMR:  Visit date not found   Allergy Levaquin  Current Outpatient Prescriptions  Medication Sig Dispense Refill  . albuterol-ipratropium (COMBIVENT) 18-103 MCG/ACT inhaler Inhale 1-2 puffs into the lungs every 6 (six) hours as needed for wheezing or shortness of breath.      [name] aspirin 325 MG tablet Take 325 mg by mouth daily.      [name] atorvastatin (LIPITOR) 40 MG tablet Take 40 mg by mouth daily.      . clopidogrel (PLAVIX) 75 MG tablet Take 75 mg by mouth daily.      [name] doxycycline (VIBRAMYCIN) 100 MG capsule Take 100 mg by mouth 2 (two) times daily.      . fenofibrate 160 MG tablet Take 160 mg by mouth daily with breakfast.       . furosemide (LASIX) 20 MG tablet Take 40 mg by mouth daily.      [name] gabapentin (NEURONTIN) 100 MG capsule Take 300 mg by mouth at bedtime.       [name] glipiZIDE (GLUCOTROL XL) 2.5 MG 24 hr tablet Take 2.5 mg by mouth daily.       [name] HYDROcodone-acetaminophen (NORCO/VICODIN) 5-325 MG per tablet Take 1 tablet by mouth 4 (four) times daily as needed for moderate pain.      [name] insulin glargine (LANTUS) 100 UNIT/ML injection Inject 20 Units into the skin daily.      . isosorbide mononitrate (IMDUR) 30 MG 24 hr tablet Take 1 tablet (30 mg total) by mouth  daily.  30 tablet  0  . levalbuterol (XOPENEX) 1.25 MG/3ML nebulizer solution Take 0.63 mg by nebulization every 4 (four) hours as needed for wheezing or shortness of breath.  72 mL  0  . lisinopril (PRINIVIL,ZESTRIL) 20 MG tablet Take 40 mg by mouth daily.       [name] loratadine (CLARITIN) 10 MG tablet Take 10 mg by mouth daily.      . metFORMIN (GLUCOPHAGE) 1000 MG tablet Take 1,000 mg by mouth daily.      . metoprolol tartrate (LOPRESSOR) 25 MG tablet Take 25 mg by mouth 2 (two) times daily.       . nitroGLYCERIN (NITROSTAT) 0.4 MG SL tablet Place 0.4 mg under the tongue every 5 (five) minutes x 3 doses as needed for chest pain.       . Pancrelipase, Lip-Prot-Amyl, (CREON) 24000 UNITS CPEP Take 48,000-72,000 Units by mouth See admin instructions. Take 72000 units 3 times daily with all meals and take 48000 units with snacks      .  pantoprazole (PROTONIX) 40 MG tablet Take 1 tablet (40 mg total) by mouth daily.  30 tablet  1  . spironolactone (ALDACTONE) 25 MG tablet Take 25 mg by mouth daily.        . SYMBICORT 160-4.5 MCG/ACT inhaler Inhale 1-2 puffs into the lungs 2 (two) times daily as needed (shorntess of breath).       . tiotropium (SPIRIVA) 18 MCG inhalation capsule Place 18 mcg into inhaler and inhale daily.       No current facility-administered medications for this visit.    Discontinued Meds:   There are no discontinued medications.  Patient Active Problem List   Diagnosis Date Noted  . COPD (chronic obstructive pulmonary disease) 08/16/2014  . Congestive heart failure 08/16/2014  . Essential hypertension 08/10/2014  . Cigarette smoker 08/10/2014  . Hypoglycemia 07/22/2014  . Hypothermia 07/22/2014  . Demand ischemia 07/14/2014  . Elevated troponin 07/13/2014  . Acute on chronic systolic CHF (congestive heart failure), NYHA class 3 07/13/2014  . Acute respiratory failure with hypoxia 07/11/2014  . Atherosclerosis 07/10/2014  . Ulcer of left lower extremity 07/10/2014  . CAP  (community acquired pneumonia) 10/29/2013  . COPD exacerbation 10/29/2013  . NSTEMI (non-ST elevated myocardial infarction) 10/27/2013  . Dysphagia 03/24/2012  . Early satiety 03/24/2012  . Encounter for screening colonoscopy 11/11/2011  . Chronic pancreatitis 06/20/2011  . History of alcohol abuse 06/05/2011  . HTN (hypertension) 06/05/2011  . Chronic systolic congestive heart failure 06/05/2011  . Secondary DM with complication 62/94/7654  . Hyperlipemia 06/04/2011  . PVD (peripheral vascular disease) 06/04/2011  . Systolic CHF, chronic 65/01/5464  . GYNECOMASTIA, UNILATERAL 08/17/2010  . CARDIOMYOPATHY, ISCHEMIC 05/18/2010  . COPD II/III still smoking  05/18/2010  . CORONARY ARTERY BYPASS GRAFT, HX OF 05/18/2010  . CLAUDICATION 04/11/2010  . HYPERKALEMIA 01/18/2010  . 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 CORONARY ARTERY DISEASE, S/P PTCA 10/20/2009  . ATHEROSLERO NATV ART EXTREM W/INTERMIT CLAUDICAT 10/20/2009  . Abdominal aneurysm without mention of rupture 10/20/2009  . AORTIC ANEURYSM OF UNSPECIFIED SITE RUPTURED 10/20/2009  . PERS HX SURG HRT&GREAT VES PRS HAZARDS HEALTH 10/20/2009    LABS    Component Value Date/Time   NA 140 08/19/2014 0609   NA 143 08/18/2014 0600   NA 139 08/17/2014 0609   K 4.1 08/19/2014 0609   K 4.2 08/18/2014 0600   K 4.7 08/17/2014 0609   CL 94* 08/19/2014 0609   CL 97 08/18/2014 0600   CL 96 08/17/2014 0609   CO2 38* 08/19/2014 0609   CO2 37* 08/18/2014 0600   CO2 34* 08/17/2014 0609   GLUCOSE 250* 08/19/2014 0609   GLUCOSE 66* 08/18/2014 0600   GLUCOSE 258* 08/17/2014 0609   BUN 21 08/19/2014 0609   BUN 23 08/18/2014 0600   BUN 19 08/17/2014 0609   CREATININE 1.01 08/19/2014 0609   CREATININE 1.14 08/18/2014 0600   CREATININE 1.01 08/17/2014 0609   CALCIUM 9.6 08/19/2014 0609   CALCIUM 9.6 08/18/2014 0600   CALCIUM 9.3 08/17/2014 0609   GFRNONAA 73* 08/19/2014 0609   GFRNONAA 63* 08/18/2014 0600    GFRNONAA 73* 08/17/2014 0609   GFRAA 84* 08/19/2014 0609   GFRAA 73* 08/18/2014 0600   GFRAA 84* 08/17/2014 0609   CMP     Component Value Date/Time   NA 140 08/19/2014 0609   K 4.1 08/19/2014 0609   CL 94* 08/19/2014 0609   CO2 38* 08/19/2014 0609   GLUCOSE 250* 08/19/2014  0609   BUN 21 08/19/2014 0609   CREATININE 1.01 08/19/2014 0609   CALCIUM 9.6 08/19/2014 0609   PROT 6.4 08/17/2014 0609   ALBUMIN 2.2* 08/17/2014 0609   AST 21 08/17/2014 0609   ALT 11 08/17/2014 0609   ALKPHOS 63 08/17/2014 0609   BILITOT <0.2* 08/17/2014 0609   GFRNONAA 73* 08/19/2014 0609   GFRAA 84* 08/19/2014 0609       Component Value Date/Time   WBC 8.2 08/17/2014 0609   WBC 8.2 08/16/2014 1351   WBC 11.4* 07/22/2014 0820   HGB 10.9* 08/17/2014 0609   HGB 10.8* 08/16/2014 1351   HGB 11.2* 07/22/2014 0820   HCT 33.9* 08/17/2014 0609   HCT 34.0* 08/16/2014 1351   HCT 34.2* 07/22/2014 0820   MCV 91.1 08/17/2014 0609   MCV 92.4 08/16/2014 1351   MCV 92.9 07/22/2014 0820    Lipid Panel     Component Value Date/Time   CHOL 169 10/28/2013 0609   TRIG 202* 10/28/2013 0609   HDL 53 10/28/2013 0609   CHOLHDL 3.2 10/28/2013 0609   VLDL 40 10/28/2013 0609   LDLCALC 76 10/28/2013 0609    ABG    Component Value Date/Time   PHART 7.322* 04/23/2010 1931   PCO2ART 47.7* 04/23/2010 1931   PO2ART 67.0* 04/23/2010 1931   HCO3 24.6* 04/23/2010 1931   TCO2 34 05/05/2010 1446   ACIDBASEDEF 2.0 04/23/2010 1931   O2SAT 91.0 04/23/2010 1931     Lab Results  Component Value Date   TSH 3.473 ***Test methodology is 3rd generation TSH**** 04/18/2010   BNP (last 3 results)  Recent Labs  07/21/14 2348 08/16/14 1351 08/17/14 0609  PROBNP 3366.0* 6508.0* 9230.0*   Cardiac Panel (last 3 results) No results found for this basename: CKTOTAL, CKMB, TROPONINI, RELINDX,  in the last 72 hours  Iron/TIBC/Ferritin/ %Sat No results found for this basename: iron, tibc, ferritin, ironpctsat     EKG Orders placed  during the hospital encounter of 08/16/14  . 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   CARDIOMYOPATHY, ISCHEMIC   ATHEROSLERO NATV ART EXTREM W/INTERMIT CLAUDICAT   AORTIC ANEURYSM OF UNSPECIFIED SITE RUPTURED   CLAUDICATION   PVD (peripheral vascular disease)   Last Assessment & Plan   08/08/2014 Office Visit Written 08/10/2014  7:26 AM by Tanda Rockers, MD     There is no cut off FEV1 for the surgery planned and although he has am cough/ congestion that will be more problematic supine/ sedated I believe he would be a reasonable surgical risk though certainly needs to make every effort to stop smoking preop and maintain abstinence post op     Systolic CHF, chronic   NSTEMI (non-ST elevated myocardial infarction)   Atherosclerosis   Acute on chronic systolic CHF (congestive heart failure), NYHA class 3   Demand ischemia   Essential hypertension   Last Assessment & Plan   08/08/2014 Office Visit Written 08/10/2014  7:25 AM by Tanda Rockers, MD     ACE inhibitors are problematic in  pts with airway complaints because  even experienced pulmonologists can't always distinguish ace effects from copd/asthma.  By themselves they  don't actually cause a problem, much like oxygen can't by itself start a fire, but they certainly serve as a powerful catalyst or enhancer for any "fire"  or inflammatory process in the upper airway, be it caused by an ET  tube or more commonly reflux (especially in the obese or pts with known GERD or who are on biphoshonates).    In the era of ARB near equivalency until we have a better handle on the reversibility of the airway problem, it just makes sense to avoid ACEI   entirely in the short run and then decide later, having established a level of airway control using a reasonable limited regimen, whether to add back ace but even then being very careful to observe the pt for worsening airway control and number of meds used/ needed to control symptoms.    Try benicar 20 mg daily x one month    Congestive heart failure     Respiratory   COPD II/III still smoking    Last Assessment & Plan   08/08/2014 Office Visit Edited 08/10/2014  7:23 AM by Tanda Rockers, MD     Spirometry 08/08/14  FEV1  1.68 (50%) ratio 62   DDX of  difficult airways management all start with A and  include Adherence, Ace Inhibitors, Acid Reflux, Active Sinus Disease, Alpha 1 Antitripsin deficiency, Anxiety masquerading as Airways dz,  ABPA,  allergy(esp in young), Aspiration (esp in elderly), Adverse effects of DPI,  Active smokers, plus two Bs  = Bronchiectasis and Beta blocker use..and one C= CHF  Adherence is always the initial "prime suspect" and is a multilayered concern that requires a "trust but verify" approach in every patient - starting with knowing how to use medications, especially inhalers, correctly, keeping up with refills and understanding the fundamental difference between maintenance and prns vs those medications only taken for a very short course and then stopped and not refilled.  The proper method of use, as well as anticipated side effects, of a metered-dose inhaler are discussed and demonstrated to the patient. Improved effectiveness after extensive coaching during this visit to a level of approximately  75% needs work   Active smoking a bid concern > discussed separately  ACEi problematic here > see HBP  Adverse effects of dpi > stop spiriva as not using it correctly anyway  See instructions for specific recommendations which were reviewed directly with the patient who was given a copy with highlighter outlining the key components.       CAP (community acquired  pneumonia)   COPD exacerbation   Acute respiratory failure with hypoxia   COPD (chronic obstructive pulmonary disease)     Digestive   Chronic pancreatitis   Last Assessment & Plan   11/11/2011 Office Visit Written 11/11/2011  5:12 PM by Orvil Feil, NP     73 year old male with hx of pancreatitis likely secondary to ETOH abuse in past. Continues to do well on pancreatic enzymes and PPI therapy. MRCP with findings as in HPI, no need for further imaging. He will return in June to set up routine screening colonoscopy.     Dysphagia     Endocrine   Secondary DM with complication   Hypoglycemia     Other   History of alcohol abuse   HYPERKALEMIA   GYNECOMASTIA, UNILATERAL   PERS HX SURG HRT&GREAT VES PRS HAZARDS HEALTH   Hyperlipemia   Encounter for screening colonoscopy   Last Assessment & Plan   11/11/2011 Office Visit  Written 11/11/2011  5:13 PM by Orvil Feil, NP     Last colonoscopy in 2003 with hyperplastic polyp. Due for repeat in June/July 2013. Will come back in June for office visit prior to setting up.    Early satiety   Ulcer of left lower extremity   Elevated troponin   Hypothermia   Cigarette smoker   Last Assessment & Plan   08/08/2014 Office Visit Written 08/10/2014  7:27 AM by Tanda Rockers, MD     > 3 min discussion  I emphasized that although we never turn away smokers from the pulmonary clinic, we do ask that they understand that the recommendations that we make  won't work nearly as well in the presence of continued cigarette exposure.  In fact, we may very well  reach a point where we can't promise to help the patient if he/she can't quit smoking. (We can and will promise to try to help, we just can't promise what we recommend will really work)         Imaging: Dg Chest Portable 1 View  08/16/2014   CLINICAL DATA:  Shortness of breath for 3 days.  EXAM: PORTABLE CHEST - 1 VIEW  COMPARISON:  07/22/2014  FINDINGS: The patient has developed moderate bilateral  pleural effusions. Slight pulmonary vascular congestion. No osseous abnormality. CABG.  IMPRESSION: New moderate bilateral pleural effusions with pulmonary vascular congestion. Findings are consistent with congestive heart failure.   Electronically Signed   By: Rozetta Nunnery M.D.   On: 08/16/2014 14:36

## 2014-09-07 ENCOUNTER — Encounter: Payer: Self-pay | Admitting: Vascular Surgery

## 2014-09-08 ENCOUNTER — Ambulatory Visit (INDEPENDENT_AMBULATORY_CARE_PROVIDER_SITE_OTHER): Payer: Medicare Other | Admitting: Vascular Surgery

## 2014-09-08 ENCOUNTER — Encounter: Payer: Self-pay | Admitting: Vascular Surgery

## 2014-09-08 ENCOUNTER — Ambulatory Visit: Payer: Medicare Other | Admitting: Internal Medicine

## 2014-09-08 VITALS — BP 142/74 | HR 79 | Ht 70.0 in | Wt 172.0 lb

## 2014-09-08 DIAGNOSIS — F17210 Nicotine dependence, cigarettes, uncomplicated: Secondary | ICD-10-CM

## 2014-09-08 DIAGNOSIS — L98499 Non-pressure chronic ulcer of skin of other sites with unspecified severity: Secondary | ICD-10-CM

## 2014-09-08 DIAGNOSIS — I739 Peripheral vascular disease, unspecified: Secondary | ICD-10-CM

## 2014-09-08 DIAGNOSIS — I70209 Unspecified atherosclerosis of native arteries of extremities, unspecified extremity: Secondary | ICD-10-CM | POA: Insufficient documentation

## 2014-09-08 NOTE — Progress Notes (Signed)
[facility] [place] HISTORY AND PHYSICAL   Reason for Consult: lower extremity ischemia History of Present Illness:  Patient is a 73 y.o. year old male who presents for evaluation of lower extremity ischemia.  Pt with 12 month history of non healing ulcers left leg.  He developed very short distance pain in left calf 3 months ago.  He can only walk about 1/2 block before pain.  This improves with 10 min rest.  He has pain in the left foot that sometimes keeps him up at night.  This has also been going on for 3 months.  He also complains of swelling in the right foot which has been present for 3 months.  He had ABI performed in December 2014 which were 0.3 left and 0.5 right.  He was seen in the hospital as a consult a few weeks ago during a COPD exacerbation.  He is a long term smoker and does wish to quit.  He had cardiac cath in Dec which showed 3 patent bypass grafts.  Also history of diabetes, hypertension, hyperlipidemia, peptic ulcer disease, remote alcohol abuse all of which are currently stable.  The ulcers have not progressed since his hospitalization.  He was scheduled for an arteriogram early and October but this was canceled as he was admitted to the hospital at [facility] with congestive failure.  CT Angio obtained in the hospital showed severe external iliac artery occlusive disease 70% bilaterally. Common femoral artery disease 50% bilaterally, superficial femoral artery occlusions bilaterally, below-knee popliteal artery was patent with three-vessel runoff bilaterally. There was also suggestion of profunda femoris disease bilaterally.    Past Medical History    Diagnosis   Date    .   Diabetes mellitus       .   Hypertension       .   Pancreatitis       .   Neuropathy       .   Hyperlipemia       .   Peripheral vascular disease             a. 10/2013: severely abnormal ABIs, for OP evaluation.    [name]   GERD (gastroesophageal reflux disease)       .   S/P  colonoscopy   July 2003          hyperplastic polyp, diverticulosis, anal papilla and internal hemorrhoids r    .   S/P endoscopy   2003          Dr. Braulio Bosch: erosive reflux esophagitis , PUD    .   PUD (peptic ulcer disease)             diagnosed via EGD by Dr. Braulio Bosch at [place]    .   Diverticulosis       .   Hemorrhoids       .   Coronary artery disease              a. BMS to LAD 1999. b. HSRA to LAD and stent to RCA 2007. c. NSTEMI/CABG x 3 in 04/2010. d. NSTEMI 10/2013 - secondary to demand ischemia in setting of COPD exacerbation with severe underlying CAD - cath with 3/3 patent grafts, for medical therapy.     [name]   COPD (chronic obstructive pulmonary disease)             a. Ongoing tobacco abuse.    .   Arthritis       .  Neutropenia             a. Dx 10/2013 on labs - instructed to f/u PCP.    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     .   Ischemic cardiomyopathy             a. previously EF 30% in 2011. b. 10/2013: EF 45-50% by echo.    .   Alcoholism             a. Remote alcoholism    .   Cleft palate             a.Chronic cleft palate from a traumatic injury as a child.        Past Surgical History    Procedure   Laterality   Date    .   Coronary artery bypass graft      June 2011          X3    .   Right inguinal hernia repair          .   Cholecystectomy                3-4 years ago    .   Appendectomy                age 27    .   Cataracts          .   Colonoscopy      05/12/2002          Dr. [name]- diverticulosis, anal papilla, internal hemorrhoids, rectal polyps    .   [name] dilation      04/16/2012          Schatzki's ring-status post dilation and disruption/ as described above. Grade 1 esophageal varices. Small hiatal hernia.    [name] dilation      04/16/2012          Procedure: [name] DILATION;  Surgeon: Daneil Dolin, MD;  Location: [facility];   Service: Endoscopy;  Laterality: N/A;    .   Colonoscopy      08/10/2012          Procedure: COLONOSCOPY;  Surgeon: Daneil Dolin, MD;  Location: [facility];  Service: Endoscopy;  Laterality: N/A;  9:45 needs 30 mins extra /have Glucagon on hand      Social History History    Substance Use Topics    .   Smoking status:   Current Every Day Smoker -- 1.50 packs/day for 63 years          Types:   Cigarettes    .   Smokeless tobacco:   Current User             Comment: on last pack now then will quit    .   Alcohol Use:   No             Comment: Quit 1985      Family History Family History    Problem   Relation   Age of Onset    .   Coronary artery disease   Mother       .   Coronary artery disease   Father       .   Diabetes   Sister       .  Suicidality   Brother       .   Cirrhosis   Brother       .   Colon cancer   Neg Hx         Allergies    Allergies    Allergen   Reactions    .   Levaquin [Levofloxacin]   Hives        Current Outpatient Prescriptions on File Prior to Visit  Medication Sig Dispense Refill  . albuterol-ipratropium (COMBIVENT) 18-103 MCG/ACT inhaler Inhale 1-2 puffs into the lungs every 6 (six) hours as needed for wheezing or shortness of breath.    [name] aspirin 325 MG tablet Take 325 mg by mouth daily.    [name] atorvastatin (LIPITOR) 40 MG tablet Take 40 mg by mouth daily.    . clopidogrel (PLAVIX) 75 MG tablet Take 75 mg by mouth daily.    [name] doxycycline (VIBRAMYCIN) 100 MG capsule Take 100 mg by mouth 2 (two) times daily.    . fenofibrate 160 MG tablet Take 160 mg by mouth daily with breakfast.     . furosemide (LASIX) 20 MG tablet Take 40 mg by mouth daily.    [name] gabapentin (NEURONTIN) 100 MG capsule Take 300 mg by mouth at bedtime.     [name] glipiZIDE (GLUCOTROL XL) 2.5 MG 24 hr tablet Take 2.5 mg by mouth daily.     [name] HYDROcodone-acetaminophen (NORCO/VICODIN) 5-325 MG per tablet Take 1 tablet by mouth 4 (four) times daily as needed for moderate pain.    [name]  insulin glargine (LANTUS) 100 UNIT/ML injection Inject 20 Units into the skin daily.    . isosorbide mononitrate (IMDUR) 30 MG 24 hr tablet Take 1 tablet (30 mg total) by mouth daily. 30 tablet 0  . levalbuterol (XOPENEX) 1.25 MG/3ML nebulizer solution Take 0.63 mg by nebulization every 4 (four) hours as needed for wheezing or shortness of breath. 72 mL 0  . lisinopril (PRINIVIL,ZESTRIL) 20 MG tablet Take 40 mg by mouth daily.     [name] loratadine (CLARITIN) 10 MG tablet Take 10 mg by mouth daily.    . metFORMIN (GLUCOPHAGE) 1000 MG tablet Take 1,000 mg by mouth daily.    . metolazone (ZAROXOLYN) 2.5 MG tablet Take 1 tablet (2.5 mg total) by mouth daily. 30 tablet 0  . metoprolol tartrate (LOPRESSOR) 25 MG tablet Take 25 mg by mouth 2 (two) times daily.     . nitroGLYCERIN (NITROSTAT) 0.4 MG SL tablet Place 0.4 mg under the tongue every 5 (five) minutes x 3 doses as needed for chest pain.     . Pancrelipase, Lip-Prot-Amyl, (CREON) 24000 UNITS CPEP Take 48,000-72,000 Units by mouth See admin instructions. Take 72000 units 3 times daily with all meals and take 48000 units with snacks    . pantoprazole (PROTONIX) 40 MG tablet Take 1 tablet (40 mg total) by mouth daily. 30 tablet 1  . spironolactone (ALDACTONE) 25 MG tablet Take 25 mg by mouth daily.      . SYMBICORT 160-4.5 MCG/ACT inhaler Inhale 1-2 puffs into the lungs 2 (two) times daily as needed (shorntess of breath).     . tiotropium (SPIRIVA) 18 MCG inhalation capsule Place 18 mcg into inhaler and inhale daily.     No current facility-administered medications on file prior to visit.    ROS:    General:  No weight loss, Fever, chills  HEENT: No recent headaches, no nasal bleeding, no visual changes, no sore throat  Neurologic: No dizziness,  blackouts, seizures. No recent symptoms of stroke or mini- stroke. No recent episodes of slurred speech, or temporary blindness.  Cardiac: No recent episodes of chest pain/pressure, no shortness of breath  at rest.  + shortness of breath with exertion.  Denies history of atrial fibrillation or irregular heartbeat  Vascular: + history of rest pain in feet.  + history of claudication.  + history of non-healing ulcer, No history of DVT    Pulmonary: No home oxygen, + productive cough, no hemoptysis,  + asthma or wheezing  Musculoskeletal:  [x ] Arthritis, [x ] Low back pain,  [ ]  Joint pain  Hematologic:No history of hypercoagulable state.  No history of easy bleeding.  No history of anemia + history of neutropenia  Gastrointestinal: No hematochezia or melena,  + gastroesophageal reflux, no trouble swallowing  Urinary: [ ]  chronic Kidney disease, [ ]  on HD - [ ]  MWF or [ ]  TTHS, [ ]  Burning with urination, [ ]  Frequent urination, [ ]  Difficulty urinating;    Skin: No rashes  Psychological: No history of anxiety,  No history of depression   Physical Examination    Filed Vitals:   09/08/14 1051  BP: 142/74  Pulse: 79  Height: 5\' 10"  (1.778 m)  Weight: 172 lb (78.019 kg)  SpO2: 98%    General:  Alert and oriented, no acute distress HEENT: Normal Neck: No JVD Pulmonary: Coarse bilaterally Cardiac: Regular Rate and Rhythm   Abdomen: Soft, non-tender, non-distended, no mass Skin: No rash, scattered punched out ulcer 5-6 left lower leg and medial left thigh Extremity Pulses:  2+ radial, brachial,absent femoral, dorsalis pedis, posterior tibial pulses bilaterally Musculoskeletal: No deformity right foot trace ankle edema  Neurologic: Upper and lower extremity motor 5/5 and symmetric  ASSESSMENT:  Severe chronic PAD, 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  Most likely we will use a left brachial approach.  Risks benefits possible complications and procedure details were explained to the patient and his son today.   Greater than 3 minutes today spent regarding smoking cessation  counseling.  Ruta Hinds, MD [facility] [place] Office: [phone] Pager: [phone]

## 2014-09-09 ENCOUNTER — Ambulatory Visit (HOSPITAL_COMMUNITY)
Admission: RE | Admit: 2014-09-09 | Discharge: 2014-09-09 | Disposition: A | Discharge status: Home / Self Care | Payer: Medicare Other | Source: Ambulatory Visit | Attending: Vascular Surgery | Admitting: Vascular Surgery

## 2014-09-09 ENCOUNTER — Ambulatory Visit: Payer: Medicare Other | Admitting: Internal Medicine

## 2014-09-09 ENCOUNTER — Encounter (HOSPITAL_COMMUNITY)
Admission: RE | Disposition: A | Discharge status: Home / Self Care | Payer: Self-pay | Source: Ambulatory Visit | Attending: Vascular Surgery

## 2014-09-09 ENCOUNTER — Other Ambulatory Visit: Payer: Self-pay

## 2014-09-09 DIAGNOSIS — Z794 Long term (current) use of insulin: Secondary | ICD-10-CM | POA: Insufficient documentation

## 2014-09-09 DIAGNOSIS — L97229 Non-pressure chronic ulcer of left calf with unspecified severity: Secondary | ICD-10-CM | POA: Diagnosis present

## 2014-09-09 DIAGNOSIS — L97129 Non-pressure chronic ulcer of left thigh with unspecified severity: Secondary | ICD-10-CM | POA: Diagnosis not present

## 2014-09-09 DIAGNOSIS — Z7902 Long term (current) use of antithrombotics/antiplatelets: Secondary | ICD-10-CM | POA: Diagnosis not present

## 2014-09-09 DIAGNOSIS — Z951 Presence of aortocoronary bypass graft: Secondary | ICD-10-CM | POA: Insufficient documentation

## 2014-09-09 DIAGNOSIS — I70239 Atherosclerosis of native arteries of right leg with ulceration of unspecified site: Secondary | ICD-10-CM

## 2014-09-09 DIAGNOSIS — I70242 Atherosclerosis of native arteries of left leg with ulceration of calf: Secondary | ICD-10-CM | POA: Insufficient documentation

## 2014-09-09 DIAGNOSIS — Z7982 Long term (current) use of aspirin: Secondary | ICD-10-CM | POA: Insufficient documentation

## 2014-09-09 DIAGNOSIS — Z79899 Other long term (current) drug therapy: Secondary | ICD-10-CM | POA: Diagnosis not present

## 2014-09-09 DIAGNOSIS — E785 Hyperlipidemia, unspecified: Secondary | ICD-10-CM | POA: Insufficient documentation

## 2014-09-09 DIAGNOSIS — J449 Chronic obstructive pulmonary disease, unspecified: Secondary | ICD-10-CM | POA: Insufficient documentation

## 2014-09-09 DIAGNOSIS — E119 Type 2 diabetes mellitus without complications: Secondary | ICD-10-CM | POA: Diagnosis not present

## 2014-09-09 DIAGNOSIS — I739 Peripheral vascular disease, unspecified: Secondary | ICD-10-CM

## 2014-09-09 DIAGNOSIS — I10 Essential (primary) hypertension: Secondary | ICD-10-CM | POA: Diagnosis not present

## 2014-09-09 DIAGNOSIS — F17210 Nicotine dependence, cigarettes, uncomplicated: Secondary | ICD-10-CM | POA: Diagnosis not present

## 2014-09-09 DIAGNOSIS — I70249 Atherosclerosis of native arteries of left leg with ulceration of unspecified site: Secondary | ICD-10-CM

## 2014-09-09 HISTORY — PX: ABDOMINAL AORTAGRAM: SHX5454

## 2014-09-09 LAB — POCT I-STAT, CHEM 8
BUN: 17 mg/dL (ref 6–23)
Calcium, Ion: 1.21 mmol/L (ref 1.13–1.30)
Chloride: 89 mEq/L — ABNORMAL LOW (ref 96–112)
Creatinine, Ser: 1 mg/dL (ref 0.50–1.35)
Glucose, Bld: 342 mg/dL — ABNORMAL HIGH (ref 70–99)
HCT: 38 % — ABNORMAL LOW (ref 39.0–52.0)
HEMOGLOBIN: 12.9 g/dL — AB (ref 13.0–17.0)
Potassium: 4 mEq/L (ref 3.7–5.3)
Sodium: 130 mEq/L — ABNORMAL LOW (ref 137–147)
TCO2: 32 mmol/L (ref 0–100)

## 2014-09-09 LAB — GLUCOSE, CAPILLARY: GLUCOSE-CAPILLARY: 354 mg/dL — AB (ref 70–99)

## 2014-09-09 SURGERY — ABDOMINAL AORTAGRAM
Anesthesia: LOCAL

## 2014-09-09 MED ORDER — PHENOL 1.4 % MT LIQD: 1.0000 | OROMUCOSAL | Status: DC | PRN

## 2014-09-09 MED ORDER — MORPHINE SULFATE 10 MG/ML IJ SOLN: 2.0000 mg | INTRAMUSCULAR | Status: DC | PRN

## 2014-09-09 MED ORDER — INSULIN ASPART 100 UNIT/ML ~~LOC~~ SOLN
0.0000 [IU] | SUBCUTANEOUS | Status: DC
  Filled 2014-09-09: qty 0.15

## 2014-09-09 MED ORDER — LIDOCAINE HCL (PF) 1 % IJ SOLN
INTRAMUSCULAR | Status: AC
  Filled 2014-09-09: qty 30

## 2014-09-09 MED ORDER — ONDANSETRON HCL 4 MG/2ML IJ SOLN: 4.0000 mg | Freq: Four times a day (QID) | INTRAMUSCULAR | Status: DC | PRN

## 2014-09-09 MED ORDER — LABETALOL HCL 5 MG/ML IV SOLN: 10.0000 mg | INTRAVENOUS | Status: DC | PRN

## 2014-09-09 MED ORDER — HEPARIN SODIUM (PORCINE) 1000 UNIT/ML IJ SOLN
INTRAMUSCULAR | Status: AC
  Filled 2014-09-09: qty 1

## 2014-09-09 MED ORDER — HYDRALAZINE HCL 20 MG/ML IJ SOLN: 5.0000 mg | INTRAMUSCULAR | Status: DC | PRN

## 2014-09-09 MED ORDER — SODIUM CHLORIDE 0.45 % IV SOLN
INTRAVENOUS | Status: DC
  Administered 2014-09-09: 75 mL/h via INTRAVENOUS

## 2014-09-09 MED ORDER — GUAIFENESIN-DM 100-10 MG/5ML PO SYRP
15.0000 mL | ORAL_SOLUTION | ORAL | Status: DC | PRN
  Filled 2014-09-09: qty 15

## 2014-09-09 MED ORDER — ACETAMINOPHEN 325 MG RE SUPP
325.0000 mg | RECTAL | Status: DC | PRN
  Filled 2014-09-09: qty 2

## 2014-09-09 MED ORDER — HEPARIN (PORCINE) IN NACL 2-0.9 UNIT/ML-% IJ SOLN
INTRAMUSCULAR | Status: AC
  Filled 2014-09-09: qty 1000

## 2014-09-09 MED ORDER — INSULIN ASPART 100 UNIT/ML ~~LOC~~ SOLN
SUBCUTANEOUS | Status: AC
  Administered 2014-09-09: 11 [IU]
  Filled 2014-09-09: qty 1

## 2014-09-09 MED ORDER — NITROGLYCERIN 0.2 MG/ML ON CALL CATH LAB
INTRAVENOUS | Status: AC
  Filled 2014-09-09: qty 1

## 2014-09-09 MED ORDER — ACETAMINOPHEN 325 MG PO TABS
325.0000 mg | ORAL_TABLET | ORAL | Status: DC | PRN
  Filled 2014-09-09: qty 2

## 2014-09-09 MED ORDER — SODIUM CHLORIDE 0.9 % IV SOLN
INTRAVENOUS | Status: DC
  Administered 2014-09-09: 06:00:00 via INTRAVENOUS

## 2014-09-09 MED ORDER — METOPROLOL TARTRATE 1 MG/ML IV SOLN: 2.0000 mg | INTRAVENOUS | Status: DC | PRN

## 2014-09-09 NOTE — Discharge Instructions (Signed)
Angiogram, Care After Refer to this sheet in the next few weeks. These instructions provide you with information on caring for yourself after your procedure. Your health care provider may also give you more specific instructions. Your treatment has been planned according to current medical practices, but problems sometimes occur. Call your health care provider if you have any problems or questions after your procedure.  WHAT TO EXPECT AFTER THE PROCEDURE After your procedure, it is typical to have the following sensations:  Minor discomfort or tenderness and a small bump at the catheter insertion site. The bump should usually decrease in size and tenderness within 1 to 2 weeks.  Any bruising will usually fade within 2 to 4 weeks. HOME CARE INSTRUCTIONS   You may need to keep taking blood thinners if they were prescribed for you. Take medicines only as directed by your health care provider.  Do not apply powder or lotion to the site.  Do not take baths, swim, or use a hot tub until your health care provider approves.  You may shower 24 hours after the procedure. Remove the bandage (dressing) and gently wash the site with plain soap and water. Gently pat the site dry.  Inspect the site at least twice daily.  Limit your activity for the first 48 hours. Do not bend, squat, or lift anything over 20 lb (9 kg) or as directed by your health care provider.  Plan to have someone take you home after the procedure. Follow instructions about when you can drive or return to work. SEEK MEDICAL CARE IF:  You get light-headed when standing up.  You have drainage (other than a small amount of blood on the dressing).  You have chills.  You have a fever.  You have redness, warmth, swelling, or pain at the insertion site. SEEK IMMEDIATE MEDICAL CARE IF:   You develop chest pain or shortness of breath, feel faint, or pass out.  You have bleeding, swelling larger than a walnut, or drainage from the  catheter insertion site.  You develop pain, discoloration, coldness, or severe bruising in the leg or arm that held the catheter.  You develop bleeding from any other place, such as the bowels. You may see bright red blood in your urine or stools, or your stools may appear black and tarry.  You have heavy bleeding from the site. If this happens, hold pressure on the site. MAKE SURE YOU:  Understand these instructions.  Will watch your condition.  Will get help right away if you are not doing well or get worse. Document Released: 05/09/2005 Document Revised: 03/07/2014 Document Reviewed: 03/15/2013 [facility] Patient Information 2015 [place], [place]. This information is not intended to replace advice given to you by your health care provider. Make sure you discuss any questions you have with your health care provider.    NO METFORMIN/GLUCOPHAGE FOR 2 DAYS

## 2014-09-09 NOTE — Progress Notes (Addendum)
Site area: Left brachial a 5 french sheath was removed by [facility]  Site Prior to Removal:  Level 0  Pressure Applied For 20 MINUTES    Minutes Beginning at 0900am  Manual:   Yes.    Patient Status During Pull:  stable  Post Pull Groin Site:  Level 0  Post Pull Instructions Given:  Yes.    Post Pull Pulses Present:  Yes.    Dressing Applied:  Yes.    Comments:  VS remain stable during sheet pull.  Pt denies any discomfort at this time.

## 2014-09-09 NOTE — Op Note (Signed)
Procedure: Aortogram with bilateral lower extremity runoff (left brachial approach)  Preoperative diagnosis: Nonhealing wounds bilateral lower extremities Postoperative diagnosis: Same  Anesthesia: Local  Operative findings: #1 severe left common and external iliac artery occlusive disease calcified  #2 high-grade stenosis right external iliac artery calcified  #3 bilateral superficial femoral artery occlusions with reconstitution of the below-knee popliteal artery and three-vessel runoff  Operative details: After obtaining informed consent, the patient was taken to the [facility] lab. The patient was placed in supine position on the Angio table. The antecubital area of the left upper extremity was prepped and draped in usual sterile fashion. Local anesthesia was inserted over the left brachial artery. Ultrasound guidance was used to cannulate the left brachial artery using a micropuncture needle. The Mc puncture wire was threaded through the micropuncture needle and the needle removed and exchanged for the micropuncture sheath over the wire. The micropuncture wire was then removed and an 035 versacore wire threaded through the micropuncture sheath up into the aortic arch and down into the descending thoracic aorta. The micropuncture sheath was then removed over guidewire exchange for a 5 French short sheath. The 5 French sheath was thoroughly flushed with 200 g of nitroglycerin and 2000 units of heparin. Pigtail catheter is placed over the guidewire into the abdominal aorta. An abdominal aortogram was then obtained and an AP projection. The left and right renal arteries are widely patent. The right common iliac artery is patent but calcified. The infrarenal abdominal aorta is calcified but patent. The left common iliac artery has several areas of high-grade stenosis with exophytic calcified nodules of plaque. This causes approximate 70% stenosis. Next the pickup catheter was pushed down just above the aortic  bifurcation and oblique views of the pelvis were obtained. This again shows multiple high-grade stenoses of the left common iliac artery. There is also subtotal occlusion of the proximal left external iliac artery. There is also a 70% stenosis of the right external iliac artery. Next bilateral lower extremity runoff views were obtained through the pigtail catheter.  In the left lower extremity, the left common femoral artery and profunda femoris is patent. The left superficial femoral artery is occluded throughout its course. The below-knee popliteal artery reconstitutes via collaterals and there is three-vessel runoff to left foot.  In the right lower extremity there are similar findings to the left.  At this point. The catheter was removed over a guidewire. The 5 French sheath was left in place to be pulled in the holding area.  Operative management: #1 the patient will be brought back in 2 weeks for atherectomy of the left iliac system possibly of the right iliac system and either angioplasty or stenting of both of these. He will then need staged left femoral to below-knee popliteal bypass followed by right femoral to below-knee popliteal bypass.  Ruta Hinds, MD [facility] [place] Office: [phone] Pager: [phone]

## 2014-09-09 NOTE — Progress Notes (Addendum)
Inpatient Diabetes Program Recommendations  AACE/ADA: New Consensus Statement on Inpatient Glycemic Control (2013)  Target Ranges:  Prepandial:   less than 140 mg/dL      Peak postprandial:   less than 180 mg/dL (1-2 hours)      Critically ill patients:  140 - 180 mg/dL   Reason for Assessment:  Diabetes history:  Type 1 diabetes according to history however patient is on Glipizide and Metformin in addition to Lantus Results for ENIS, RIECKE (MRN [id number]) as of 09/09/2014 14:15  Ref. Range 09/09/2014 09:38  Glucose-Capillary Latest Range: 70-99 mg/dL 354 (H)  Outpatient Diabetes medications: Lantus 20 units daily, Glipizide 2.5 mg daily, Metformin 1000 mg daily  Current orders for Inpatient glycemic control:  Novolog moderate q 4 hours.  Please restart home dose of Lantus 20 units daily.  Thanks, Adah Perl, RN, BC-ADM Inpatient Diabetes Coordinator Pager [phone]

## 2014-09-09 NOTE — H&P (View-Only) (Signed)
[facility] [place] HISTORY AND PHYSICAL   Reason for Consult: lower extremity ischemia History of Present Illness:  Patient is a 73 y.o. year old male who presents for evaluation of lower extremity ischemia.  Pt with 12 month history of non healing ulcers left leg.  He developed very short distance pain in left calf 3 months ago.  He can only walk about 1/2 block before pain.  This improves with 10 min rest.  He has pain in the left foot that sometimes keeps him up at night.  This has also been going on for 3 months.  He also complains of swelling in the right foot which has been present for 3 months.  He had ABI performed in December 2014 which were 0.3 left and 0.5 right.  He was seen in the hospital as a consult a few weeks ago during a COPD exacerbation.  He is a long term smoker and does wish to quit.  He had cardiac cath in Dec which showed 3 patent bypass grafts.  Also history of diabetes, hypertension, hyperlipidemia, peptic ulcer disease, remote alcohol abuse all of which are currently stable.  The ulcers have not progressed since his hospitalization.  He was scheduled for an arteriogram early and October but this was canceled as he was admitted to the hospital at [facility] with congestive failure.  CT Angio obtained in the hospital showed severe external iliac artery occlusive disease 70% bilaterally. Common femoral artery disease 50% bilaterally, superficial femoral artery occlusions bilaterally, below-knee popliteal artery was patent with three-vessel runoff bilaterally. There was also suggestion of profunda femoris disease bilaterally.    Past Medical History    Diagnosis   Date    .   Diabetes mellitus       .   Hypertension       .   Pancreatitis       .   Neuropathy       .   Hyperlipemia       .   Peripheral vascular disease             a. 10/2013: severely abnormal ABIs, for OP evaluation.    [name]   GERD (gastroesophageal reflux disease)       .   S/P  colonoscopy   July 2003          hyperplastic polyp, diverticulosis, anal papilla and internal hemorrhoids r    .   S/P endoscopy   2003          Dr. Braulio Bosch: erosive reflux esophagitis , PUD    .   PUD (peptic ulcer disease)             diagnosed via EGD by Dr. Braulio Bosch at [place]    .   Diverticulosis       .   Hemorrhoids       .   Coronary artery disease              a. BMS to LAD 1999. b. HSRA to LAD and stent to RCA 2007. c. NSTEMI/CABG x 3 in 04/2010. d. NSTEMI 10/2013 - secondary to demand ischemia in setting of COPD exacerbation with severe underlying CAD - cath with 3/3 patent grafts, for medical therapy.     [name]   COPD (chronic obstructive pulmonary disease)             a. Ongoing tobacco abuse.    .   Arthritis       .  Neutropenia             a. Dx 10/2013 on labs - instructed to f/u PCP.    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     .   Ischemic cardiomyopathy             a. previously EF 30% in 2011. b. 10/2013: EF 45-50% by echo.    .   Alcoholism             a. Remote alcoholism    .   Cleft palate             a.Chronic cleft palate from a traumatic injury as a child.        Past Surgical History    Procedure   Laterality   Date    .   Coronary artery bypass graft      June 2011          X3    .   Right inguinal hernia repair          .   Cholecystectomy                3-4 years ago    .   Appendectomy                age 62    .   Cataracts          .   Colonoscopy      05/12/2002          Dr. [name]- diverticulosis, anal papilla, internal hemorrhoids, rectal polyps    .   [name] dilation      04/16/2012          Schatzki's ring-status post dilation and disruption/ as described above. Grade 1 esophageal varices. Small hiatal hernia.    [name] dilation      04/16/2012          Procedure: [name] DILATION;  Surgeon: Daneil Dolin, MD;  [facility];   Service: Endoscopy;  Laterality: N/A;    .   Colonoscopy      08/10/2012          Procedure: COLONOSCOPY;  Surgeon: Daneil Dolin, MD;  [facility];  Service: Endoscopy;  Laterality: N/A;  9:45 needs 30 mins extra /have Glucagon on hand      Social History History    Substance Use Topics    .   Smoking status:   Current Every Day Smoker -- 1.50 packs/day for 63 years          Types:   Cigarettes    .   Smokeless tobacco:   Current User             Comment: on last pack now then will quit    .   Alcohol Use:   No             Comment: Quit 1985      Family History Family History    Problem   Relation   Age of Onset    .   Coronary artery disease   Mother       .   Coronary artery disease   Father       .   Diabetes   Sister       .  Suicidality   Brother       .   Cirrhosis   Brother       .   Colon cancer   Neg Hx         Allergies    Allergies    Allergen   Reactions    .   Levaquin [Levofloxacin]   Hives        Current Outpatient Prescriptions on File Prior to Visit  Medication Sig Dispense Refill  . albuterol-ipratropium (COMBIVENT) 18-103 MCG/ACT inhaler Inhale 1-2 puffs into the lungs every 6 (six) hours as needed for wheezing or shortness of breath.    [name] aspirin 325 MG tablet Take 325 mg by mouth daily.    [name] atorvastatin (LIPITOR) 40 MG tablet Take 40 mg by mouth daily.    . clopidogrel (PLAVIX) 75 MG tablet Take 75 mg by mouth daily.    [name] doxycycline (VIBRAMYCIN) 100 MG capsule Take 100 mg by mouth 2 (two) times daily.    . fenofibrate 160 MG tablet Take 160 mg by mouth daily with breakfast.     . furosemide (LASIX) 20 MG tablet Take 40 mg by mouth daily.    [name] gabapentin (NEURONTIN) 100 MG capsule Take 300 mg by mouth at bedtime.     [name] glipiZIDE (GLUCOTROL XL) 2.5 MG 24 hr tablet Take 2.5 mg by mouth daily.     [name] HYDROcodone-acetaminophen (NORCO/VICODIN) 5-325 MG per tablet Take 1 tablet by mouth 4 (four) times daily as needed for moderate pain.    [name]  insulin glargine (LANTUS) 100 UNIT/ML injection Inject 20 Units into the skin daily.    . isosorbide mononitrate (IMDUR) 30 MG 24 hr tablet Take 1 tablet (30 mg total) by mouth daily. 30 tablet 0  . levalbuterol (XOPENEX) 1.25 MG/3ML nebulizer solution Take 0.63 mg by nebulization every 4 (four) hours as needed for wheezing or shortness of breath. 72 mL 0  . lisinopril (PRINIVIL,ZESTRIL) 20 MG tablet Take 40 mg by mouth daily.     [name] loratadine (CLARITIN) 10 MG tablet Take 10 mg by mouth daily.    . metFORMIN (GLUCOPHAGE) 1000 MG tablet Take 1,000 mg by mouth daily.    . metolazone (ZAROXOLYN) 2.5 MG tablet Take 1 tablet (2.5 mg total) by mouth daily. 30 tablet 0  . metoprolol tartrate (LOPRESSOR) 25 MG tablet Take 25 mg by mouth 2 (two) times daily.     . nitroGLYCERIN (NITROSTAT) 0.4 MG SL tablet Place 0.4 mg under the tongue every 5 (five) minutes x 3 doses as needed for chest pain.     . Pancrelipase, Lip-Prot-Amyl, (CREON) 24000 UNITS CPEP Take 48,000-72,000 Units by mouth See admin instructions. Take 72000 units 3 times daily with all meals and take 48000 units with snacks    . pantoprazole (PROTONIX) 40 MG tablet Take 1 tablet (40 mg total) by mouth daily. 30 tablet 1  . spironolactone (ALDACTONE) 25 MG tablet Take 25 mg by mouth daily.      . SYMBICORT 160-4.5 MCG/ACT inhaler Inhale 1-2 puffs into the lungs 2 (two) times daily as needed (shorntess of breath).     . tiotropium (SPIRIVA) 18 MCG inhalation capsule Place 18 mcg into inhaler and inhale daily.     No current facility-administered medications on file prior to visit.    ROS:    General:  No weight loss, Fever, chills  HEENT: No recent headaches, no nasal bleeding, no visual changes, no sore throat  Neurologic: No dizziness,  blackouts, seizures. No recent symptoms of stroke or mini- stroke. No recent episodes of slurred speech, or temporary blindness.  Cardiac: No recent episodes of chest pain/pressure, no shortness of breath  at rest.  + shortness of breath with exertion.  Denies history of atrial fibrillation or irregular heartbeat  Vascular: + history of rest pain in feet.  + history of claudication.  + history of non-healing ulcer, No history of DVT    Pulmonary: No home oxygen, + productive cough, no hemoptysis,  + asthma or wheezing  Musculoskeletal:  [x ] Arthritis, [x ] Low back pain,  [ ]  Joint pain  Hematologic:No history of hypercoagulable state.  No history of easy bleeding.  No history of anemia + history of neutropenia  Gastrointestinal: No hematochezia or melena,  + gastroesophageal reflux, no trouble swallowing  Urinary: [ ]  chronic Kidney disease, [ ]  on HD - [ ]  MWF or [ ]  TTHS, [ ]  Burning with urination, [ ]  Frequent urination, [ ]  Difficulty urinating;    Skin: No rashes  Psychological: No history of anxiety,  No history of depression   Physical Examination    Filed Vitals:   09/08/14 1051  BP: 142/74  Pulse: 79  Height: 5\' 10"  (1.778 m)  Weight: 172 lb (78.019 kg)  SpO2: 98%    General:  Alert and oriented, no acute distress HEENT: Normal Neck: No JVD Pulmonary: Coarse bilaterally Cardiac: Regular Rate and Rhythm   Abdomen: Soft, non-tender, non-distended, no mass Skin: No rash, scattered punched out ulcer 5-6 left lower leg and medial left thigh Extremity Pulses:  2+ radial, brachial,absent femoral, dorsalis pedis, posterior tibial pulses bilaterally Musculoskeletal: No deformity right foot trace ankle edema  Neurologic: Upper and lower extremity motor 5/5 and symmetric  ASSESSMENT:  Severe chronic PAD, 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  Most likely we will use a left brachial approach.  Risks benefits possible complications and procedure details were explained to the patient and his son today.   Greater than 3 minutes today spent regarding smoking cessation  counseling.  Ruta Hinds, MD [facility] [place] Office: [phone] Pager: [phone]

## 2014-09-09 NOTE — Progress Notes (Signed)
Blood sugar 342; Eliezer Champagne notified, as well as Dr. [name]

## 2014-09-09 NOTE — Interval H&P Note (Signed)
History and Physical Interval Note:  09/09/2014 7:43 AM  Stephen [name]  has presented today for surgery, with the diagnosis of pad  The various methods of treatment have been discussed with the patient and family. After consideration of risks, benefits and other options for treatment, the patient has consented to  Procedure(s): ABDOMINAL AORTAGRAM (N/A) as a surgical intervention .  The patient's history has been reviewed, patient examined, no change in status, stable for surgery.  I have reviewed the patient's chart and labs.  Questions were answered to the patient's satisfaction.     Angella Montas E

## 2014-09-22 MED ORDER — SODIUM CHLORIDE 0.9 % IV SOLN
INTRAVENOUS | Status: DC
  Administered 2014-09-23: 07:00:00 via INTRAVENOUS

## 2014-09-23 ENCOUNTER — Other Ambulatory Visit: Payer: Self-pay | Admitting: *Deleted

## 2014-09-23 ENCOUNTER — Ambulatory Visit (HOSPITAL_COMMUNITY)
Admission: RE | Admit: 2014-09-23 | Discharge: 2014-09-23 | Disposition: A | Discharge status: Home / Self Care | Payer: Medicare Other | Source: Ambulatory Visit | Attending: Vascular Surgery | Admitting: Vascular Surgery

## 2014-09-23 ENCOUNTER — Telehealth: Payer: Self-pay | Admitting: Vascular Surgery

## 2014-09-23 ENCOUNTER — Encounter (HOSPITAL_COMMUNITY)
Admission: RE | Disposition: A | Discharge status: Home / Self Care | Payer: Self-pay | Source: Ambulatory Visit | Attending: Vascular Surgery

## 2014-09-23 DIAGNOSIS — Z955 Presence of coronary angioplasty implant and graft: Secondary | ICD-10-CM | POA: Diagnosis not present

## 2014-09-23 DIAGNOSIS — Z7902 Long term (current) use of antithrombotics/antiplatelets: Secondary | ICD-10-CM | POA: Insufficient documentation

## 2014-09-23 DIAGNOSIS — E785 Hyperlipidemia, unspecified: Secondary | ICD-10-CM | POA: Insufficient documentation

## 2014-09-23 DIAGNOSIS — L97129 Non-pressure chronic ulcer of left thigh with unspecified severity: Secondary | ICD-10-CM | POA: Diagnosis not present

## 2014-09-23 DIAGNOSIS — K219 Gastro-esophageal reflux disease without esophagitis: Secondary | ICD-10-CM | POA: Diagnosis not present

## 2014-09-23 DIAGNOSIS — J449 Chronic obstructive pulmonary disease, unspecified: Secondary | ICD-10-CM | POA: Insufficient documentation

## 2014-09-23 DIAGNOSIS — Z79891 Long term (current) use of opiate analgesic: Secondary | ICD-10-CM | POA: Insufficient documentation

## 2014-09-23 DIAGNOSIS — I70241 Atherosclerosis of native arteries of left leg with ulceration of thigh: Secondary | ICD-10-CM | POA: Insufficient documentation

## 2014-09-23 DIAGNOSIS — I252 Old myocardial infarction: Secondary | ICD-10-CM | POA: Diagnosis not present

## 2014-09-23 DIAGNOSIS — I2510 Atherosclerotic heart disease of native coronary artery without angina pectoris: Secondary | ICD-10-CM | POA: Insufficient documentation

## 2014-09-23 DIAGNOSIS — Z79899 Other long term (current) drug therapy: Secondary | ICD-10-CM | POA: Insufficient documentation

## 2014-09-23 DIAGNOSIS — Z95820 Peripheral vascular angioplasty status with implants and grafts: Secondary | ICD-10-CM

## 2014-09-23 DIAGNOSIS — I10 Essential (primary) hypertension: Secondary | ICD-10-CM | POA: Diagnosis not present

## 2014-09-23 DIAGNOSIS — Z951 Presence of aortocoronary bypass graft: Secondary | ICD-10-CM | POA: Insufficient documentation

## 2014-09-23 DIAGNOSIS — I70248 Atherosclerosis of native arteries of left leg with ulceration of other part of lower left leg: Secondary | ICD-10-CM | POA: Insufficient documentation

## 2014-09-23 DIAGNOSIS — L97829 Non-pressure chronic ulcer of other part of left lower leg with unspecified severity: Secondary | ICD-10-CM | POA: Diagnosis not present

## 2014-09-23 DIAGNOSIS — I70201 Unspecified atherosclerosis of native arteries of extremities, right leg: Secondary | ICD-10-CM | POA: Insufficient documentation

## 2014-09-23 DIAGNOSIS — E119 Type 2 diabetes mellitus without complications: Secondary | ICD-10-CM | POA: Diagnosis not present

## 2014-09-23 DIAGNOSIS — I255 Ischemic cardiomyopathy: Secondary | ICD-10-CM | POA: Diagnosis not present

## 2014-09-23 DIAGNOSIS — Z7982 Long term (current) use of aspirin: Secondary | ICD-10-CM | POA: Insufficient documentation

## 2014-09-23 DIAGNOSIS — F1021 Alcohol dependence, in remission: Secondary | ICD-10-CM | POA: Insufficient documentation

## 2014-09-23 DIAGNOSIS — Z794 Long term (current) use of insulin: Secondary | ICD-10-CM | POA: Diagnosis not present

## 2014-09-23 DIAGNOSIS — I739 Peripheral vascular disease, unspecified: Secondary | ICD-10-CM | POA: Diagnosis present

## 2014-09-23 DIAGNOSIS — F17210 Nicotine dependence, cigarettes, uncomplicated: Secondary | ICD-10-CM | POA: Diagnosis not present

## 2014-09-23 DIAGNOSIS — I70239 Atherosclerosis of native arteries of right leg with ulceration of unspecified site: Secondary | ICD-10-CM

## 2014-09-23 DIAGNOSIS — I70249 Atherosclerosis of native arteries of left leg with ulceration of unspecified site: Secondary | ICD-10-CM

## 2014-09-23 HISTORY — PX: ATHERECTOMY: SHX5502

## 2014-09-23 LAB — POCT I-STAT, CHEM 8
BUN: 26 mg/dL — AB (ref 6–23)
CALCIUM ION: 1.18 mmol/L (ref 1.13–1.30)
Chloride: 92 mEq/L — ABNORMAL LOW (ref 96–112)
Creatinine, Ser: 1.1 mg/dL (ref 0.50–1.35)
Glucose, Bld: 115 mg/dL — ABNORMAL HIGH (ref 70–99)
HCT: 38 % — ABNORMAL LOW (ref 39.0–52.0)
Hemoglobin: 12.9 g/dL — ABNORMAL LOW (ref 13.0–17.0)
Potassium: 4.3 mEq/L (ref 3.7–5.3)
Sodium: 129 mEq/L — ABNORMAL LOW (ref 137–147)
TCO2: 27 mmol/L (ref 0–100)

## 2014-09-23 LAB — POCT ACTIVATED CLOTTING TIME
ACTIVATED CLOTTING TIME: 180 s
Activated Clotting Time: 197 seconds
Activated Clotting Time: 287 seconds
Activated Clotting Time: 236 seconds
Activated Clotting Time: 191 seconds

## 2014-09-23 LAB — GLUCOSE, CAPILLARY
Glucose-Capillary: 108 mg/dL — ABNORMAL HIGH (ref 70–99)
Glucose-Capillary: 123 mg/dL — ABNORMAL HIGH (ref 70–99)

## 2014-09-23 SURGERY — ATHERECTOMY
Anesthesia: LOCAL | Laterality: Bilateral

## 2014-09-23 MED ORDER — SODIUM CHLORIDE 0.45 % IV SOLN
INTRAVENOUS | Status: DC
  Administered 2014-09-23: 12:00:00 via INTRAVENOUS

## 2014-09-23 MED ORDER — HEPARIN SODIUM (PORCINE) 1000 UNIT/ML IJ SOLN
INTRAMUSCULAR | Status: AC
  Filled 2014-09-23: qty 1

## 2014-09-23 MED ORDER — MIDAZOLAM HCL 2 MG/2ML IJ SOLN
INTRAMUSCULAR | Status: AC
  Filled 2014-09-23: qty 2

## 2014-09-23 MED ORDER — LABETALOL HCL 5 MG/ML IV SOLN
10.0000 mg | INTRAVENOUS | Status: DC | PRN
  Administered 2014-09-23: 10 mg via INTRAVENOUS

## 2014-09-23 MED ORDER — OXYCODONE HCL 5 MG PO TABS
ORAL_TABLET | ORAL | Status: AC
  Filled 2014-09-23: qty 2

## 2014-09-23 MED ORDER — ONDANSETRON HCL 4 MG/2ML IJ SOLN: 4.0000 mg | Freq: Four times a day (QID) | INTRAMUSCULAR | Status: DC | PRN

## 2014-09-23 MED ORDER — VERAPAMIL HCL 2.5 MG/ML IV SOLN
INTRAVENOUS | Status: AC
  Filled 2014-09-23: qty 2

## 2014-09-23 MED ORDER — DEXTROSE 5 % IV SOLN: 1.5000 g | Freq: Two times a day (BID) | INTRAVENOUS | Status: DC

## 2014-09-23 MED ORDER — HYDRALAZINE HCL 20 MG/ML IJ SOLN: 5.0000 mg | INTRAMUSCULAR | Status: DC | PRN

## 2014-09-23 MED ORDER — NITROGLYCERIN IN D5W 200-5 MCG/ML-% IV SOLN
INTRAVENOUS | Status: AC
  Filled 2014-09-23: qty 250

## 2014-09-23 MED ORDER — HEPARIN (PORCINE) IN NACL 2-0.9 UNIT/ML-% IJ SOLN
INTRAMUSCULAR | Status: AC
  Filled 2014-09-23: qty 1000

## 2014-09-23 MED ORDER — METOPROLOL TARTRATE 1 MG/ML IV SOLN: 2.0000 mg | INTRAVENOUS | Status: DC | PRN

## 2014-09-23 MED ORDER — OXYCODONE HCL 5 MG PO TABS
5.0000 mg | ORAL_TABLET | ORAL | Status: DC | PRN
  Administered 2014-09-23: 10 mg via ORAL

## 2014-09-23 MED ORDER — ACETAMINOPHEN 325 MG PO TABS: 325.0000 mg | ORAL_TABLET | ORAL | Status: DC | PRN

## 2014-09-23 MED ORDER — SODIUM CHLORIDE 0.9 % IV SOLN: 500.0000 mL | Freq: Once | INTRAVENOUS | Status: DC | PRN

## 2014-09-23 MED ORDER — FENTANYL CITRATE 0.05 MG/ML IJ SOLN
INTRAMUSCULAR | Status: AC
  Filled 2014-09-23: qty 2

## 2014-09-23 MED ORDER — ACETAMINOPHEN 325 MG RE SUPP: 325.0000 mg | RECTAL | Status: DC | PRN

## 2014-09-23 MED ORDER — LIDOCAINE HCL (PF) 1 % IJ SOLN
INTRAMUSCULAR | Status: AC
  Filled 2014-09-23: qty 30

## 2014-09-23 MED ORDER — LABETALOL HCL 5 MG/ML IV SOLN
INTRAVENOUS | Status: AC
  Filled 2014-09-23: qty 4

## 2014-09-23 MED ORDER — MORPHINE SULFATE 10 MG/ML IJ SOLN: 2.0000 mg | INTRAMUSCULAR | Status: DC | PRN

## 2014-09-23 NOTE — Interval H&P Note (Signed)
History and Physical Interval Note:  09/23/2014 8:25 AM  Stephen [name]  has presented today for surgery, with the diagnosis of pad  The various methods of treatment have been discussed with the patient and family. After consideration of risks, benefits and other options for treatment, the patient has consented to  Procedure(s): ATHERECTOMY (N/A) as a surgical intervention .  The patient's history has been reviewed, patient examined, no change in status, stable for surgery.  I have reviewed the patient's chart and labs.  Questions were answered to the patient's satisfaction.     [name] E

## 2014-09-23 NOTE — Telephone Encounter (Addendum)
-----   Message from Mena Goes, RN sent at 09/23/2014  3:19 PM EST ----- Regarding: Schedule   ----- Message -----    From: Elam Dutch, MD    Sent: 09/23/2014  10:43 AM      To: [facility] Charge Pool  Bilateral femoral puncture Ultrasound bilat groin Right external iliac atherectomy/ stent Left common and external iliac atherectomy/stent Abdominal aortogram  He needs a follow up office visit in 1 month with bilateral ABI  Ruta Hinds   09/23/14: lm for pt re appt, dpm

## 2014-09-23 NOTE — Progress Notes (Signed)
Site area: LFA Site Prior to Removal:  Level 0 Pressure Applied For:20 Manual:    Patient Status During Pull:  stable Post Pull Site:  Level 0 Post Pull Instructions Given:  yes Post Pull Pulses Present: doppler Dressing Applied:  clear Bedrest begins @ 1300 Comments:no complications

## 2014-09-23 NOTE — H&P (View-Only) (Signed)
[facility] [place] HISTORY AND PHYSICAL   Reason for Consult: lower extremity ischemia History of Present Illness:  Patient is a 73 y.o. year old male who presents for evaluation of lower extremity ischemia.  Pt with 12 month history of non healing ulcers left leg.  He developed very short distance pain in left calf 3 months ago.  He can only walk about 1/2 block before pain.  This improves with 10 min rest.  He has pain in the left foot that sometimes keeps him up at night.  This has also been going on for 3 months.  He also complains of swelling in the right foot which has been present for 3 months.  He had ABI performed in December 2014 which were 0.3 left and 0.5 right.  He was seen in the hospital as a consult a few weeks ago during a COPD exacerbation.  He is a long term smoker and does wish to quit.  He had cardiac cath in Dec which showed 3 patent bypass grafts.  Also history of diabetes, hypertension, hyperlipidemia, peptic ulcer disease, remote alcohol abuse all of which are currently stable.  The ulcers have not progressed since his hospitalization.  He was scheduled for an arteriogram early and October but this was canceled as he was admitted to the hospital at [facility] with congestive failure.  CT Angio obtained in the hospital showed severe external iliac artery occlusive disease 70% bilaterally. Common femoral artery disease 50% bilaterally, superficial femoral artery occlusions bilaterally, below-knee popliteal artery was patent with three-vessel runoff bilaterally. There was also suggestion of profunda femoris disease bilaterally.    Past Medical History    Diagnosis   Date    .   Diabetes mellitus       .   Hypertension       .   Pancreatitis       .   Neuropathy       .   Hyperlipemia       .   Peripheral vascular disease             a. 10/2013: severely abnormal ABIs, for OP evaluation.    [name]   GERD (gastroesophageal reflux disease)       .   S/P  colonoscopy   July 2003          hyperplastic polyp, diverticulosis, anal papilla and internal hemorrhoids r    .   S/P endoscopy   2003          Dr. Braulio Bosch: erosive reflux esophagitis , PUD    .   PUD (peptic ulcer disease)             diagnosed via EGD by Dr. Braulio Bosch at [place]    .   Diverticulosis       .   Hemorrhoids       .   Coronary artery disease              a. BMS to LAD 1999. b. HSRA to LAD and stent to RCA 2007. c. NSTEMI/CABG x 3 in 04/2010. d. NSTEMI 10/2013 - secondary to demand ischemia in setting of COPD exacerbation with severe underlying CAD - cath with 3/3 patent grafts, for medical therapy.     [name]   COPD (chronic obstructive pulmonary disease)             a. Ongoing tobacco abuse.    .   Arthritis       .  Neutropenia             a. Dx 10/2013 on labs - instructed to f/u PCP.    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     .   Ischemic cardiomyopathy             a. previously EF 30% in 2011. b. 10/2013: EF 45-50% by echo.    .   Alcoholism             a. Remote alcoholism    .   Cleft palate             a.Chronic cleft palate from a traumatic injury as a child.        Past Surgical History    Procedure   Laterality   Date    .   Coronary artery bypass graft      June 2011          X3    .   Right inguinal hernia repair          .   Cholecystectomy                3-4 years ago    .   Appendectomy                age 67    .   Cataracts          .   Colonoscopy      05/12/2002          Dr. [name]- diverticulosis, anal papilla, internal hemorrhoids, rectal polyps    .   [name] dilation      04/16/2012          Schatzki's ring-status post dilation and disruption/ as described above. Grade 1 esophageal varices. Small hiatal hernia.    [name] dilation      04/16/2012          Procedure: [name] DILATION;  Surgeon: Daneil Dolin, MD;  [facility];   Service: Endoscopy;  Laterality: N/A;    .   Colonoscopy      08/10/2012          Procedure: COLONOSCOPY;  Surgeon: Daneil Dolin, MD;  [facility];  Service: Endoscopy;  Laterality: N/A;  9:45 needs 30 mins extra /have Glucagon on hand      Social History History    Substance Use Topics    .   Smoking status:   Current Every Day Smoker -- 1.50 packs/day for 63 years          Types:   Cigarettes    .   Smokeless tobacco:   Current User             Comment: on last pack now then will quit    .   Alcohol Use:   No             Comment: Quit 1985      Family History Family History    Problem   Relation   Age of Onset    .   Coronary artery disease   Mother       .   Coronary artery disease   Father       .   Diabetes   Sister       .  Suicidality   Brother       .   Cirrhosis   Brother       .   Colon cancer   Neg Hx         Allergies    Allergies    Allergen   Reactions    .   Levaquin [Levofloxacin]   Hives        Current Outpatient Prescriptions on File Prior to Visit  Medication Sig Dispense Refill  . albuterol-ipratropium (COMBIVENT) 18-103 MCG/ACT inhaler Inhale 1-2 puffs into the lungs every 6 (six) hours as needed for wheezing or shortness of breath.    [name] aspirin 325 MG tablet Take 325 mg by mouth daily.    [name] atorvastatin (LIPITOR) 40 MG tablet Take 40 mg by mouth daily.    . clopidogrel (PLAVIX) 75 MG tablet Take 75 mg by mouth daily.    [name] doxycycline (VIBRAMYCIN) 100 MG capsule Take 100 mg by mouth 2 (two) times daily.    . fenofibrate 160 MG tablet Take 160 mg by mouth daily with breakfast.     . furosemide (LASIX) 20 MG tablet Take 40 mg by mouth daily.    [name] gabapentin (NEURONTIN) 100 MG capsule Take 300 mg by mouth at bedtime.     [name] glipiZIDE (GLUCOTROL XL) 2.5 MG 24 hr tablet Take 2.5 mg by mouth daily.     [name] HYDROcodone-acetaminophen (NORCO/VICODIN) 5-325 MG per tablet Take 1 tablet by mouth 4 (four) times daily as needed for moderate pain.    [name]  insulin glargine (LANTUS) 100 UNIT/ML injection Inject 20 Units into the skin daily.    . isosorbide mononitrate (IMDUR) 30 MG 24 hr tablet Take 1 tablet (30 mg total) by mouth daily. 30 tablet 0  . levalbuterol (XOPENEX) 1.25 MG/3ML nebulizer solution Take 0.63 mg by nebulization every 4 (four) hours as needed for wheezing or shortness of breath. 72 mL 0  . lisinopril (PRINIVIL,ZESTRIL) 20 MG tablet Take 40 mg by mouth daily.     [name] loratadine (CLARITIN) 10 MG tablet Take 10 mg by mouth daily.    . metFORMIN (GLUCOPHAGE) 1000 MG tablet Take 1,000 mg by mouth daily.    . metolazone (ZAROXOLYN) 2.5 MG tablet Take 1 tablet (2.5 mg total) by mouth daily. 30 tablet 0  . metoprolol tartrate (LOPRESSOR) 25 MG tablet Take 25 mg by mouth 2 (two) times daily.     . nitroGLYCERIN (NITROSTAT) 0.4 MG SL tablet Place 0.4 mg under the tongue every 5 (five) minutes x 3 doses as needed for chest pain.     . Pancrelipase, Lip-Prot-Amyl, (CREON) 24000 UNITS CPEP Take 48,000-72,000 Units by mouth See admin instructions. Take 72000 units 3 times daily with all meals and take 48000 units with snacks    . pantoprazole (PROTONIX) 40 MG tablet Take 1 tablet (40 mg total) by mouth daily. 30 tablet 1  . spironolactone (ALDACTONE) 25 MG tablet Take 25 mg by mouth daily.      . SYMBICORT 160-4.5 MCG/ACT inhaler Inhale 1-2 puffs into the lungs 2 (two) times daily as needed (shorntess of breath).     . tiotropium (SPIRIVA) 18 MCG inhalation capsule Place 18 mcg into inhaler and inhale daily.     No current facility-administered medications on file prior to visit.    ROS:    General:  No weight loss, Fever, chills  HEENT: No recent headaches, no nasal bleeding, no visual changes, no sore throat  Neurologic: No dizziness,  blackouts, seizures. No recent symptoms of stroke or mini- stroke. No recent episodes of slurred speech, or temporary blindness.  Cardiac: No recent episodes of chest pain/pressure, no shortness of breath  at rest.  + shortness of breath with exertion.  Denies history of atrial fibrillation or irregular heartbeat  Vascular: + history of rest pain in feet.  + history of claudication.  + history of non-healing ulcer, No history of DVT    Pulmonary: No home oxygen, + productive cough, no hemoptysis,  + asthma or wheezing  Musculoskeletal:  [x ] Arthritis, [x ] Low back pain,  [ ]  Joint pain  Hematologic:No history of hypercoagulable state.  No history of easy bleeding.  No history of anemia + history of neutropenia  Gastrointestinal: No hematochezia or melena,  + gastroesophageal reflux, no trouble swallowing  Urinary: [ ]  chronic Kidney disease, [ ]  on HD - [ ]  MWF or [ ]  TTHS, [ ]  Burning with urination, [ ]  Frequent urination, [ ]  Difficulty urinating;    Skin: No rashes  Psychological: No history of anxiety,  No history of depression   Physical Examination    Filed Vitals:   09/08/14 1051  BP: 142/74  Pulse: 79  Height: 5\' 10"  (1.778 m)  Weight: 172 lb (78.019 kg)  SpO2: 98%    General:  Alert and oriented, no acute distress HEENT: Normal Neck: No JVD Pulmonary: Coarse bilaterally Cardiac: Regular Rate and Rhythm   Abdomen: Soft, non-tender, non-distended, no mass Skin: No rash, scattered punched out ulcer 5-6 left lower leg and medial left thigh Extremity Pulses:  2+ radial, brachial,absent femoral, dorsalis pedis, posterior tibial pulses bilaterally Musculoskeletal: No deformity right foot trace ankle edema  Neurologic: Upper and lower extremity motor 5/5 and symmetric  ASSESSMENT:  Severe chronic PAD, 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  Most likely we will use a left brachial approach.  Risks benefits possible complications and procedure details were explained to the patient and his son today.   Greater than 3 minutes today spent regarding smoking cessation  counseling.  Ruta Hinds, MD [facility] [place] Office: [phone] Pager: [phone]

## 2014-09-23 NOTE — Discharge Instructions (Signed)

## 2014-09-23 NOTE — Progress Notes (Signed)
Site area: rt groinSite Prior to Removal:  Level  Pressure Applied For:20 minutes Manual:   yes Patient Status During Pull:  Level 0 Post Pull Site:  Level 0 Post Pull Instructions Given:  yes Post Pull Pulses Present: dp/pt doppler Dressing Applied:  yes Bedrest begins @  Comments:

## 2014-09-23 NOTE — Brief Op Note (Signed)
09/23/2014  12:00 PM  PATIENT:  Stephen [name]  73 y.o. male  PRE-OPERATIVE DIAGNOSIS:  pad  POST-OPERATIVE DIAGNOSIS:  same  PROCEDURE:  Procedure(s) with comments: ATHERECTOMY (Bilateral) - iliacs PERCUTANEOUS STENT INTERVENTION (Bilateral) - iliacs  SURGEON:  Surgeon(s) and Role:    * Elam Dutch, MD - Primary  Bilateral groin 7 Fr [place] [address] units of heparin

## 2014-09-23 NOTE — Op Note (Signed)
Procedure: Aortogram, right external iliac artery diamondback atherectomy, right external iliac stent, left common and external iliac atherectomy left common and external iliac stent  Preop: PAD with non healing ulcers Postop: same Anesthesia: local with sedation  Operative findings: Right and left femoral puncture 7 Fr sheath.  8 x 80 left common/external iliac self expanding stent.  Right external iliac 8 x 60 self expanding stent.  Operative details: After obtaining informed consent the patent was placed on the angio table.   Both groins were prepped and draped in usual fashion.  Ultrasound was used to identify the right common femoral artery.  Local anesthesia was infiltrated over this.  Using ultrasound guidance the right common femoral artery was successfully cannulated and an 035 versacore wire threaded into the right iliac system.  A 7 Fr bright tip sheath was placed over this.  With some manipulation I was able to advance the guidewire up the right iliac system and into the abdominal aorta.  A 5 Fr KMP catheter was placed over the wire and the versacore wire exchanged for an 014 viper wire.  A contrast angiogram was obtained with hand injection through the sheath revealing a high grade very calcified 80% stenosis of the proximal external iliac artery at the iliac bifurcation.  The patient was given 7000 units of heparin.  The diamondback device was placed over the viper wire and centered on this lesion multiple passes were made first with low then medium then high speed moving the catheter back and forth over the lesion.  A balloon angioplasty was then performed over this entire segment using a 7 x 4 balloon with overlapping inflations to 6 atm for 1 minute.  A completion angiogram showed two small areas of dissection at the proximal and distal ends of the atherectomy site so a 8 x 60 self expanding stent was brought on the field and placed over the guidewire centering on the previously  atherectomized area.  This was then deployed and then post dilated with the 7 x 4  Balloon again to 6 atm.  Repeat agram showed a widely patent external iliac.  The stent was adjacent to the internal iliac takeoff but did not obstruct flow.  Guidewires and catheters were removed from the right groin and the sheath thoroughly flushed with heparinized saline.    An ACT was checked and found to be 197 at this point so the pt was rebolused with 5000 units of heparin and attention was turned to the left groin.  Ultrasound was used to identify the left common femoral artery.  Local anesthesia was infiltrated over this.  Using ultrasound guidance the left common femoral artery was successfully cannulated and an 035 versacore wire threaded into the left iliac system.  A 7 Fr bright tip sheath was placed over this.  The verscore wire was exchanged for an 035 angled glidewire and the KMP catheter placed over this for support.  With some manipulation I was able to advance the glidewire up the left iliac system and into the abdominal aorta.  A 5 Fr KMP catheter was placed over the wire and the versacore wire exchanged for an 014 viper wire.  A contrast angiogram was obtained with hand injection through the sheath revealing a high grade very calcified subtotal occlusion of the proximal common and external iliac artery.   The diamondback device was placed over the viper wire and centered on this lesion multiple passes were made first with low then medium then high speed moving   the catheter back and forth over the lesion.  A balloon angioplasty was then performed over this entire segment using a 7 x 4 balloon with overlapping inflations to 6 atm for 1 minute.  The viper wire was exchanged through the balloon to the versacore wire prior to balloon inflation.  A 5 Fr pigtail catheter was then placed over the wire into the abdominal aorta.  A completion angiogram showed two small areas of dissection at the proximal and distal  ends of the atherectomy site as well as still a 70% stenosis at the midpoint of the previously atherectomized artery so a 8 x 80 self expanding stent was brought on the field and placed over the guidewire centering on the previously atherectomized area extending over the entire distance of the common iliac and 2/3 of the external iliac artery.   This was then deployed and then post dilated first with a 7 x 2 balloon at the stenosed area then with overlapping inflations over the entire stent with the 7 x 2  Balloon again to 6 atm.  A pigtail catheter was placed back over the guidewire and Repeat agram showed a widely patent common and external iliac with residual mid 25% stenosis. At this point each long sheath was exchanged over a guidewire for short 7 fr sheaths.  The patient tolerated the procedure well and was taken to the holding area in stable condition.   The patient will return for follow up in one month to consider whether bilateral outflow procedures in the form of fempop bypass will be necessary.  Charles Fields, MD [facility] Office: [phone] Pager: [phone]  

## 2014-10-02 ENCOUNTER — Other Ambulatory Visit: Payer: Self-pay | Admitting: Adult Health

## 2014-10-03 ENCOUNTER — Encounter: Payer: Self-pay | Admitting: *Deleted

## 2014-10-03 ENCOUNTER — Encounter: Payer: Medicare Other | Admitting: Adult Health

## 2014-10-03 NOTE — Progress Notes (Signed)
   No Show ERROR

## 2014-10-13 ENCOUNTER — Encounter (HOSPITAL_COMMUNITY): Payer: Self-pay | Admitting: Cardiovascular Disease

## 2014-10-31 ENCOUNTER — Other Ambulatory Visit: Payer: Self-pay | Admitting: Adult Health

## 2014-11-01 ENCOUNTER — Encounter: Payer: Self-pay | Admitting: *Deleted

## 2014-11-02 ENCOUNTER — Encounter: Payer: Self-pay | Admitting: Vascular Surgery

## 2014-11-03 ENCOUNTER — Ambulatory Visit (HOSPITAL_COMMUNITY)
Admit: 2014-11-03 | Discharge: 2014-11-03 | Disposition: A | Discharge status: Home / Self Care | Payer: Medicare Other | Attending: Vascular Surgery | Admitting: Vascular Surgery

## 2014-11-03 ENCOUNTER — Ambulatory Visit (INDEPENDENT_AMBULATORY_CARE_PROVIDER_SITE_OTHER): Payer: Medicare Other | Admitting: Vascular Surgery

## 2014-11-03 ENCOUNTER — Encounter: Payer: Self-pay | Admitting: Vascular Surgery

## 2014-11-03 VITALS — BP 169/70 | HR 84 | Ht 70.0 in | Wt 170.0 lb

## 2014-11-03 DIAGNOSIS — I70243 Atherosclerosis of native arteries of left leg with ulceration of ankle: Secondary | ICD-10-CM | POA: Diagnosis not present

## 2014-11-03 DIAGNOSIS — Z87891 Personal history of nicotine dependence: Secondary | ICD-10-CM | POA: Insufficient documentation

## 2014-11-03 DIAGNOSIS — Z95820 Peripheral vascular angioplasty status with implants and grafts: Secondary | ICD-10-CM

## 2014-11-03 DIAGNOSIS — L98499 Non-pressure chronic ulcer of skin of other sites with unspecified severity: Secondary | ICD-10-CM

## 2014-11-03 DIAGNOSIS — E119 Type 2 diabetes mellitus without complications: Secondary | ICD-10-CM | POA: Diagnosis not present

## 2014-11-03 DIAGNOSIS — I739 Peripheral vascular disease, unspecified: Secondary | ICD-10-CM | POA: Diagnosis present

## 2014-11-03 DIAGNOSIS — I10 Essential (primary) hypertension: Secondary | ICD-10-CM | POA: Diagnosis not present

## 2014-11-03 DIAGNOSIS — I70209 Unspecified atherosclerosis of native arteries of extremities, unspecified extremity: Secondary | ICD-10-CM

## 2014-11-03 DIAGNOSIS — E785 Hyperlipidemia, unspecified: Secondary | ICD-10-CM | POA: Insufficient documentation

## 2014-11-03 DIAGNOSIS — I70242 Atherosclerosis of native arteries of left leg with ulceration of calf: Secondary | ICD-10-CM | POA: Diagnosis not present

## 2014-11-03 DIAGNOSIS — Z9889 Other specified postprocedural states: Secondary | ICD-10-CM

## 2014-11-03 DIAGNOSIS — Z9862 Peripheral vascular angioplasty status: Secondary | ICD-10-CM | POA: Insufficient documentation

## 2014-11-03 NOTE — Progress Notes (Signed)
HISTORY AND PHYSICAL     CC:  F/u for procedure Referring Provider:  No ref. provider found  HPI: This is a 73 y.o. male who is s/p  Aortogram with right EIA diamondback athrectomy, right EIA stent, left CIA and EIA athrectomy and left CIA and EIA stent 09/23/14.  He states that his wounds on his left leg are getting better and the wounds on the right leg have healed.  He states that he has to sleep in a chair due to pain in his left leg.  He states that he does have swelling in his legs bilaterally.    He is on a statin for his hypercholesterolemia.  He is on a beta blocker and ACEI for his hypertension.  He is also diabetic and takes insulin as well as oral agents for this.  Past Medical History  Diagnosis Date  . Diabetes mellitus   . Hypertension   . Pancreatitis   . Neuropathy   . Hyperlipemia   . Peripheral vascular disease     a. 10/2013: severely abnormal ABIs, for OP evaluation.  [name] GERD (gastroesophageal reflux disease)   . S/P colonoscopy July 2003    hyperplastic polyp, diverticulosis, anal papilla and internal hemorrhoids r  . S/P endoscopy 2003    Dr. Braulio Bosch: erosive reflux esophagitis , PUD  . PUD (peptic ulcer disease)     diagnosed via EGD by Dr. Braulio Bosch at [place]  . Diverticulosis   . Hemorrhoids   . Coronary artery disease      a. BMS to LAD 1999. b. HSRA to LAD and stent to RCA 2007. c. NSTEMI/CABG x 3 in 04/2010. d. NSTEMI 10/2013 - secondary to demand ischemia in setting of COPD exacerbation with severe underlying CAD - cath with 3/3 patent grafts, for medical therapy.   [name] COPD (chronic obstructive pulmonary disease)     a. Ongoing tobacco abuse.  . Arthritis   . Neutropenia     a. Dx 10/2013 on labs - instructed to f/u PCP.  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   . Ischemic cardiomyopathy     a. previously EF 30% in 2011. b. 10/2013: EF 45-50% by echo.  .  Alcoholism     a. Remote alcoholism  . 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    Past Surgical History  Procedure Laterality Date  . Coronary artery bypass graft  June 2011    X3  . Right inguinal hernia repair    . Cholecystectomy      3-4 years ago  . Appendectomy      age 34  . Cataracts    . Colonoscopy  05/12/2002    Dr. [name]- diverticulosis, anal papilla, internal hemorrhoids, rectal polyps  . [name] dilation  04/16/2012    Schatzki's ring-status post dilation and disruption/ as described above. Grade 1 esophageal varices. Small hiatal hernia.  [name] dilation  04/16/2012    Procedure: [name] DILATION;  Surgeon: Daneil Dolin, MD;  Location: [facility];  Service: Endoscopy;  Laterality: N/A;  . Colonoscopy  08/10/2012    Procedure: COLONOSCOPY;  Surgeon: Daneil Dolin, MD;  Location: [facility];  Service: Endoscopy;  Laterality: N/A;  9:45 needs 30 mins extra /have Glucagon on hand  . Left heart catheterization with coronary/graft angiogram N/A 10/29/2013    Procedure: LEFT HEART CATHETERIZATION WITH  [name];  Surgeon: Burnell Blanks, MD;  Location: [facility] CATH LAB;  Service: Cardiovascular;  Laterality: N/A;  . Abdominal aortagram N/A 09/09/2014    Procedure: ABDOMINAL Maxcine Ham;  Surgeon: Elam Dutch, MD;  Location: [facility] CATH LAB;  Service: Cardiovascular;  Laterality: N/A;  . Atherectomy Bilateral 09/23/2014    Procedure: ATHERECTOMY;  Surgeon: Elam Dutch, MD;  Location: [facility] CATH LAB;  Service: Cardiovascular;  Laterality: Bilateral;  iliacs    Allergies  Allergen Reactions  . Levaquin [Levofloxacin] Hives    Current Outpatient Prescriptions  Medication Sig Dispense Refill  . albuterol-ipratropium (COMBIVENT) 18-103 MCG/ACT inhaler Inhale 1-2 puffs into the lungs every 6 (six) hours as needed for wheezing or shortness of breath.    [name] aspirin 325 MG tablet Take 325 mg by mouth daily.    [name]  atorvastatin (LIPITOR) 40 MG tablet Take 40 mg by mouth daily.    . clopidogrel (PLAVIX) 75 MG tablet Take 75 mg by mouth daily.    [name] doxycycline (VIBRAMYCIN) 100 MG capsule Take 100 mg by mouth 2 (two) times daily.    . fenofibrate 160 MG tablet Take 160 mg by mouth daily with breakfast.     . furosemide (LASIX) 20 MG tablet Take 40 mg by mouth daily.    [name] gabapentin (NEURONTIN) 100 MG capsule Take 300 mg by mouth at bedtime.     [name] glipiZIDE (GLUCOTROL XL) 2.5 MG 24 hr tablet Take 2.5 mg by mouth daily.     [name] HYDROcodone-acetaminophen (NORCO/VICODIN) 5-325 MG per tablet Take 1 tablet by mouth 4 (four) times daily as needed for moderate pain.    [name] insulin glargine (LANTUS) 100 UNIT/ML injection Inject 20 Units into the skin daily.    . isosorbide mononitrate (IMDUR) 30 MG 24 hr tablet Take 1 tablet (30 mg total) by mouth daily. 30 tablet 0  . levalbuterol (XOPENEX) 1.25 MG/3ML nebulizer solution Take 0.63 mg by nebulization every 4 (four) hours as needed for wheezing or shortness of breath. 72 mL 0  . lisinopril (PRINIVIL,ZESTRIL) 20 MG tablet Take 40 mg by mouth daily.     [name] loratadine (CLARITIN) 10 MG tablet Take 10 mg by mouth daily.    . metFORMIN (GLUCOPHAGE) 1000 MG tablet Take 1,000 mg by mouth daily.    . metolazone (ZAROXOLYN) 2.5 MG tablet TAKE ONE (1) TABLET EACH DAY 30 tablet 0  . metoprolol tartrate (LOPRESSOR) 25 MG tablet Take 25 mg by mouth 2 (two) times daily.     . nitroGLYCERIN (NITROSTAT) 0.4 MG SL tablet Place 0.4 mg under the tongue every 5 (five) minutes x 3 doses as needed for chest pain.     . Pancrelipase, Lip-Prot-Amyl, (CREON) 24000 UNITS CPEP Take 48,000-72,000 Units by mouth See admin instructions. Take 72000 units 3 times daily with all meals and take 48000 units with snacks    . pantoprazole (PROTONIX) 40 MG tablet Take 1 tablet (40 mg total) by mouth daily. 30 tablet 1  . spironolactone (ALDACTONE) 25 MG tablet Take 25 mg by mouth daily.      . SYMBICORT 160-4.5  MCG/ACT inhaler Inhale 1-2 puffs into the lungs 2 (two) times daily as needed (shorntess of breath).     . tiotropium (SPIRIVA) 18 MCG inhalation capsule Place 18 mcg into inhaler and inhale daily.     No current facility-administered medications for this visit.    Family History  Problem Relation Age of Onset  . Coronary artery disease Mother   .  Coronary artery disease Father   . Diabetes Sister   . Suicidality Brother   . Cirrhosis Brother   . Colon cancer Neg Hx     History   Social History  . Marital Status: Married    Spouse Name: N/A    Number of Children: N/A  . Years of Education: N/A   Occupational History  . Disabled    Social History Main Topics  . Smoking status: Current Every Day Smoker -- 1.00 packs/day for 63 years    Types: Cigarettes  . Smokeless tobacco: Never Used  . Alcohol Use: No     Comment: Quit 1985  . Drug Use: No  . Sexual Activity: Not Currently   Other Topics Concern  . Not on file   Social History Narrative     ROS: [x]  Positive   [ ]  Negative   [ ]  All sytems reviewed and are negative  Cardiovascular: []  chest pain/pressure []  palpitations []  SOB lying flat []  DOE [x]  pain in legs while walking [x]  pain in feet when lying flat []  hx of DVT []  hx of phlebitis [x]  swelling in legs []  varicose veins  Pulmonary: []  productive cough []  asthma []  wheezing  Neurologic: [x]  weakness in []  arms [x]  legs-bilaterally []  numbness in []  arms []  legs [] difficulty speaking or slurred speech []  temporary loss of vision in one eye []  dizziness  Hematologic: []  bleeding problems []  problems with blood clotting easily  GI []  vomiting blood []  blood in stool  GU: []  burning with urination []  blood in urine  Psychiatric: []  hx of major depression  Integumentary: []  rashes [x]  ulcers-left leg  Constitutional: []  fever []  chills   PHYSICAL EXAMINATION:  Filed Vitals:   11/03/14 1028  BP: 169/70  Pulse: 84    Body mass index is 24.39 kg/(m^2).  General:  WDWN in NAD Gait: Not observed HENT: WNL, normocephalic Pulmonary: normal non-labored breathing , without Rales, rhonchi,  wheezing Cardiac: RRR Skin: without rashes, with ulcers  Vascular Exam/Pulses:  Right Left  Femoral 2+ (normal) 2+ (normal)  Popliteal absent absent  DP absent absent  PT absent absent   Extremities: without ischemic changes, without Gangrene , without cellulitis; with open wounds to medial malleolus, anterior shin, and lateral leg just distal to the knee and these are dry without drainage. Musculoskeletal: no muscle wasting or atrophy  Neurologic: A&O X 3; Appropriate Affect ; SENSATION: normal; MOTOR FUNCTION:  moving all extremities equally. Speech is fluent/normal   Non-Invasive Vascular Imaging:   ABI's 11/03/14: Right 0.55 Left:  Not done due to ulcers on leg  Pt meds includes: Statin:  Yes.   Beta Blocker:  Yes.   Aspirin:  Yes.   ACEI:  Yes.   ARB:  No Other Antiplatelet/Anticoagulant:  Yes.   Plavix   ASSESSMENT/PLAN:: 73 y.o. male with PAD and non healing wounds to the LLE and is s/p Aortogram with right EIA diamondback athrectomy, right EIA stent, left CIA and EIA athrectomy and left CIA and EIA stent 09/23/14   -pt's wounds are healing on his left leg.  He does have palpable femoral pulses bilaterally. -would not consider a femoral to popliteal bypass on him at this time due to being high risk.  Given his wounds are healing, we will continue to watch and see him back in 6 weeks for re-evaluation.  The wounds on the right leg have healed. -he is encouraged to continue to work at getting stronger by walking in his  home 30 minutes per day.  He does have a walker to use.   He is discouraged from using his crutches.   Leontine Locket, PA-C [facility] [phone]  Clinic MD:  Pt seen and examined in conjunction with Dr. [name]  Patient has cardiomyopathy, severe  deconditioning, severe COPD. I have had several discussions with the patient and his sons and do not believe that he is in any condition to tolerate a femoropopliteal bypass currently. He has a patent aortoiliac system at this point. He has easily palpable bilateral femoral pulses. He has healed some ulcers on the left leg. He has no ulcers on the right leg currently. Hopefully the left leg ulcers will continue to heal with conservative measures. Due to his underlying multiple comorbidities and do not believe he will ever be a candidate for a femoropopliteal bypass. The occlusions in his lower extremities are too lengthy and calcified to consider percutaneous options. The patient will follow-up with me in 6 weeks' time to see if he has improved overall.  Ruta Hinds, MD [facility] Office: [phone] Pager: [phone]

## 2014-12-01 ENCOUNTER — Other Ambulatory Visit: Payer: Self-pay | Admitting: Adult Health

## 2014-12-03 ENCOUNTER — Emergency Department (HOSPITAL_COMMUNITY): Payer: Medicare Other

## 2014-12-03 ENCOUNTER — Inpatient Hospital Stay (HOSPITAL_COMMUNITY)
Admission: EM | Admit: 2014-12-03 | Discharge: 2014-12-09 | DRG: 854 | Disposition: A | Discharge status: Skilled Nursing Facility | Payer: Medicare Other | Attending: Internal Medicine | Admitting: Internal Medicine

## 2014-12-03 ENCOUNTER — Encounter (HOSPITAL_COMMUNITY): Payer: Self-pay | Admitting: Emergency Medicine

## 2014-12-03 DIAGNOSIS — R778 Other specified abnormalities of plasma proteins: Secondary | ICD-10-CM

## 2014-12-03 DIAGNOSIS — I252 Old myocardial infarction: Secondary | ICD-10-CM

## 2014-12-03 DIAGNOSIS — Z951 Presence of aortocoronary bypass graft: Secondary | ICD-10-CM

## 2014-12-03 DIAGNOSIS — E785 Hyperlipidemia, unspecified: Secondary | ICD-10-CM | POA: Diagnosis present

## 2014-12-03 DIAGNOSIS — D62 Acute posthemorrhagic anemia: Secondary | ICD-10-CM | POA: Diagnosis present

## 2014-12-03 DIAGNOSIS — I10 Essential (primary) hypertension: Secondary | ICD-10-CM | POA: Diagnosis present

## 2014-12-03 DIAGNOSIS — Z79899 Other long term (current) drug therapy: Secondary | ICD-10-CM

## 2014-12-03 DIAGNOSIS — R7989 Other specified abnormal findings of blood chemistry: Secondary | ICD-10-CM

## 2014-12-03 DIAGNOSIS — Z79891 Long term (current) use of opiate analgesic: Secondary | ICD-10-CM | POA: Diagnosis not present

## 2014-12-03 DIAGNOSIS — Z7951 Long term (current) use of inhaled steroids: Secondary | ICD-10-CM

## 2014-12-03 DIAGNOSIS — R1310 Dysphagia, unspecified: Secondary | ICD-10-CM | POA: Diagnosis present

## 2014-12-03 DIAGNOSIS — J449 Chronic obstructive pulmonary disease, unspecified: Secondary | ICD-10-CM

## 2014-12-03 DIAGNOSIS — I2510 Atherosclerotic heart disease of native coronary artery without angina pectoris: Secondary | ICD-10-CM | POA: Diagnosis present

## 2014-12-03 DIAGNOSIS — K868 Other specified diseases of pancreas: Secondary | ICD-10-CM | POA: Diagnosis present

## 2014-12-03 DIAGNOSIS — Z881 Allergy status to other antibiotic agents status: Secondary | ICD-10-CM

## 2014-12-03 DIAGNOSIS — Z794 Long term (current) use of insulin: Secondary | ICD-10-CM | POA: Diagnosis not present

## 2014-12-03 DIAGNOSIS — K8689 Other specified diseases of pancreas: Secondary | ICD-10-CM

## 2014-12-03 DIAGNOSIS — I447 Left bundle-branch block, unspecified: Secondary | ICD-10-CM | POA: Diagnosis present

## 2014-12-03 DIAGNOSIS — Z7902 Long term (current) use of antithrombotics/antiplatelets: Secondary | ICD-10-CM

## 2014-12-03 DIAGNOSIS — E1142 Type 2 diabetes mellitus with diabetic polyneuropathy: Secondary | ICD-10-CM | POA: Diagnosis present

## 2014-12-03 DIAGNOSIS — E1152 Type 2 diabetes mellitus with diabetic peripheral angiopathy with gangrene: Secondary | ICD-10-CM | POA: Insufficient documentation

## 2014-12-03 DIAGNOSIS — I96 Gangrene, not elsewhere classified: Secondary | ICD-10-CM | POA: Diagnosis present

## 2014-12-03 DIAGNOSIS — I83893 Varicose veins of bilateral lower extremities with other complications: Secondary | ICD-10-CM

## 2014-12-03 DIAGNOSIS — L03116 Cellulitis of left lower limb: Secondary | ICD-10-CM | POA: Diagnosis present

## 2014-12-03 DIAGNOSIS — I8390 Asymptomatic varicose veins of unspecified lower extremity: Secondary | ICD-10-CM | POA: Diagnosis present

## 2014-12-03 DIAGNOSIS — I255 Ischemic cardiomyopathy: Secondary | ICD-10-CM | POA: Diagnosis present

## 2014-12-03 DIAGNOSIS — J9601 Acute respiratory failure with hypoxia: Secondary | ICD-10-CM

## 2014-12-03 DIAGNOSIS — F0390 Unspecified dementia without behavioral disturbance: Secondary | ICD-10-CM | POA: Diagnosis present

## 2014-12-03 DIAGNOSIS — Z955 Presence of coronary angioplasty implant and graft: Secondary | ICD-10-CM

## 2014-12-03 DIAGNOSIS — Z8249 Family history of ischemic heart disease and other diseases of the circulatory system: Secondary | ICD-10-CM

## 2014-12-03 DIAGNOSIS — L089 Local infection of the skin and subcutaneous tissue, unspecified: Secondary | ICD-10-CM | POA: Insufficient documentation

## 2014-12-03 DIAGNOSIS — L97329 Non-pressure chronic ulcer of left ankle with unspecified severity: Secondary | ICD-10-CM | POA: Diagnosis present

## 2014-12-03 DIAGNOSIS — F17210 Nicotine dependence, cigarettes, uncomplicated: Secondary | ICD-10-CM | POA: Diagnosis present

## 2014-12-03 DIAGNOSIS — J441 Chronic obstructive pulmonary disease with (acute) exacerbation: Secondary | ICD-10-CM | POA: Diagnosis present

## 2014-12-03 DIAGNOSIS — K219 Gastro-esophageal reflux disease without esophagitis: Secondary | ICD-10-CM | POA: Diagnosis present

## 2014-12-03 DIAGNOSIS — A419 Sepsis, unspecified organism: Secondary | ICD-10-CM | POA: Diagnosis present

## 2014-12-03 DIAGNOSIS — Z66 Do not resuscitate: Secondary | ICD-10-CM | POA: Diagnosis present

## 2014-12-03 DIAGNOSIS — Z7982 Long term (current) use of aspirin: Secondary | ICD-10-CM

## 2014-12-03 DIAGNOSIS — L0390 Cellulitis, unspecified: Secondary | ICD-10-CM

## 2014-12-03 DIAGNOSIS — I5042 Chronic combined systolic (congestive) and diastolic (congestive) heart failure: Secondary | ICD-10-CM | POA: Diagnosis present

## 2014-12-03 DIAGNOSIS — L97929 Non-pressure chronic ulcer of unspecified part of left lower leg with unspecified severity: Secondary | ICD-10-CM | POA: Diagnosis present

## 2014-12-03 DIAGNOSIS — I739 Peripheral vascular disease, unspecified: Secondary | ICD-10-CM | POA: Diagnosis present

## 2014-12-03 DIAGNOSIS — T148XXA Other injury of unspecified body region, initial encounter: Secondary | ICD-10-CM

## 2014-12-03 LAB — PROTIME-INR
INR: 0.97 (ref 0.00–1.49)
PROTHROMBIN TIME: 13 s (ref 11.6–15.2)

## 2014-12-03 LAB — GLUCOSE, CAPILLARY: GLUCOSE-CAPILLARY: 201 mg/dL — AB (ref 70–99)

## 2014-12-03 LAB — CBG MONITORING, ED
GLUCOSE-CAPILLARY: 288 mg/dL — AB (ref 70–99)
Glucose-Capillary: 210 mg/dL — ABNORMAL HIGH (ref 70–99)

## 2014-12-03 LAB — CBC WITH DIFFERENTIAL/PLATELET
Basophils Absolute: 0 10*3/uL (ref 0.0–0.1)
Basophils Relative: 0 % (ref 0–1)
Eosinophils Absolute: 0.1 10*3/uL (ref 0.0–0.7)
Eosinophils Relative: 0 % (ref 0–5)
HEMATOCRIT: 29.5 % — AB (ref 39.0–52.0)
Hemoglobin: 9.7 g/dL — ABNORMAL LOW (ref 13.0–17.0)
LYMPHS PCT: 10 % — AB (ref 12–46)
Lymphs Abs: 1.5 10*3/uL (ref 0.7–4.0)
MCH: 29.8 pg (ref 26.0–34.0)
MCHC: 32.9 g/dL (ref 30.0–36.0)
MCV: 90.5 fL (ref 78.0–100.0)
Monocytes Absolute: 1.1 10*3/uL — ABNORMAL HIGH (ref 0.1–1.0)
Monocytes Relative: 7 % (ref 3–12)
NEUTROS ABS: 12.6 10*3/uL — AB (ref 1.7–7.7)
Neutrophils Relative %: 83 % — ABNORMAL HIGH (ref 43–77)
PLATELETS: 548 10*3/uL — AB (ref 150–400)
RBC: 3.26 MIL/uL — ABNORMAL LOW (ref 4.22–5.81)
RDW: 14.5 % (ref 11.5–15.5)
WBC: 15.3 10*3/uL — AB (ref 4.0–10.5)

## 2014-12-03 LAB — BASIC METABOLIC PANEL
ANION GAP: 10 (ref 5–15)
BUN: 31 mg/dL — ABNORMAL HIGH (ref 6–23)
CO2: 32 mmol/L (ref 19–32)
Calcium: 8.8 mg/dL (ref 8.4–10.5)
Chloride: 90 mmol/L — ABNORMAL LOW (ref 96–112)
Creatinine, Ser: 1.3 mg/dL (ref 0.50–1.35)
GFR calc non Af Amer: 54 mL/min — ABNORMAL LOW (ref >90)
GFR, EST AFRICAN AMERICAN: 62 mL/min — AB (ref >90)
Glucose, Bld: 250 mg/dL — ABNORMAL HIGH (ref 70–99)
POTASSIUM: 4.3 mmol/L (ref 3.5–5.1)
Sodium: 132 mmol/L — ABNORMAL LOW (ref 135–145)

## 2014-12-03 LAB — TROPONIN I: Troponin I: 0.09 ng/mL — ABNORMAL HIGH (ref <0.031)

## 2014-12-03 MED ORDER — PANTOPRAZOLE SODIUM 40 MG PO TBEC
40.0000 mg | DELAYED_RELEASE_TABLET | Freq: Every day | ORAL | Status: DC
  Administered 2014-12-03 – 2014-12-09 (×5): 40 mg via ORAL
  Filled 2014-12-03 (×5): qty 1

## 2014-12-03 MED ORDER — PANCRELIPASE (LIP-PROT-AMYL) 12000-38000 UNITS PO CPEP
48000.0000 [IU] | ORAL_CAPSULE | ORAL | Status: DC | PRN
  Filled 2014-12-03: qty 4

## 2014-12-03 MED ORDER — ASPIRIN 325 MG PO TABS
325.0000 mg | ORAL_TABLET | Freq: Every day | ORAL | Status: DC
  Administered 2014-12-04 – 2014-12-07 (×3): 325 mg via ORAL
  Filled 2014-12-03 (×5): qty 1

## 2014-12-03 MED ORDER — ONDANSETRON HCL 4 MG/2ML IJ SOLN
4.0000 mg | Freq: Four times a day (QID) | INTRAMUSCULAR | Status: DC | PRN
  Filled 2014-12-03: qty 2

## 2014-12-03 MED ORDER — GABAPENTIN 300 MG PO CAPS
300.0000 mg | ORAL_CAPSULE | Freq: Every day | ORAL | Status: DC
  Administered 2014-12-03 – 2014-12-08 (×5): 300 mg via ORAL
  Filled 2014-12-03 (×7): qty 1

## 2014-12-03 MED ORDER — FUROSEMIDE 40 MG PO TABS
40.0000 mg | ORAL_TABLET | Freq: Every day | ORAL | Status: DC
  Administered 2014-12-04 – 2014-12-09 (×6): 40 mg via ORAL
  Filled 2014-12-03 (×6): qty 1

## 2014-12-03 MED ORDER — TIOTROPIUM BROMIDE MONOHYDRATE 18 MCG IN CAPS
18.0000 ug | ORAL_CAPSULE | Freq: Every day | RESPIRATORY_TRACT | Status: DC
  Administered 2014-12-07 – 2014-12-09 (×3): 18 ug via RESPIRATORY_TRACT
  Filled 2014-12-03 (×2): qty 5

## 2014-12-03 MED ORDER — MORPHINE SULFATE 4 MG/ML IJ SOLN
4.0000 mg | INTRAMUSCULAR | Status: DC | PRN
  Administered 2014-12-03 – 2014-12-05 (×7): 4 mg via INTRAVENOUS
  Filled 2014-12-03 (×8): qty 1

## 2014-12-03 MED ORDER — SODIUM CHLORIDE 0.9 % IJ SOLN
3.0000 mL | Freq: Two times a day (BID) | INTRAMUSCULAR | Status: DC
  Administered 2014-12-03 – 2014-12-09 (×8): 3 mL via INTRAVENOUS

## 2014-12-03 MED ORDER — NICOTINE 14 MG/24HR TD PT24
14.0000 mg | MEDICATED_PATCH | Freq: Every day | TRANSDERMAL | Status: DC
  Administered 2014-12-04 – 2014-12-09 (×5): 14 mg via TRANSDERMAL
  Filled 2014-12-03 (×6): qty 1

## 2014-12-03 MED ORDER — HEPARIN BOLUS VIA INFUSION
4000.0000 [IU] | Freq: Once | INTRAVENOUS | Status: AC
  Administered 2014-12-03: 4000 [IU] via INTRAVENOUS

## 2014-12-03 MED ORDER — PANCRELIPASE (LIP-PROT-AMYL) 24000-76000 UNITS PO CPEP: 48000.0000 [IU] | ORAL_CAPSULE | ORAL | Status: DC

## 2014-12-03 MED ORDER — HEPARIN (PORCINE) IN NACL 100-0.45 UNIT/ML-% IJ SOLN
900.0000 [IU]/h | INTRAMUSCULAR | Status: DC
  Administered 2014-12-03: 900 [IU]/h via INTRAVENOUS
  Filled 2014-12-03 (×2): qty 250

## 2014-12-03 MED ORDER — PIPERACILLIN-TAZOBACTAM 3.375 G IVPB 30 MIN
3.3750 g | Freq: Once | INTRAVENOUS | Status: AC
  Administered 2014-12-03: 3.375 g via INTRAVENOUS
  Filled 2014-12-03 (×2): qty 50

## 2014-12-03 MED ORDER — VANCOMYCIN HCL IN DEXTROSE 750-5 MG/150ML-% IV SOLN
750.0000 mg | Freq: Two times a day (BID) | INTRAVENOUS | Status: DC
  Administered 2014-12-04 – 2014-12-05 (×3): 750 mg via INTRAVENOUS
  Filled 2014-12-03 (×5): qty 150

## 2014-12-03 MED ORDER — ASPIRIN 81 MG PO CHEW
324.0000 mg | CHEWABLE_TABLET | Freq: Once | ORAL | Status: AC
  Administered 2014-12-03: 324 mg via ORAL
  Filled 2014-12-03: qty 4

## 2014-12-03 MED ORDER — ACETAMINOPHEN 650 MG RE SUPP: 650.0000 mg | Freq: Four times a day (QID) | RECTAL | Status: DC | PRN

## 2014-12-03 MED ORDER — LISINOPRIL 40 MG PO TABS
40.0000 mg | ORAL_TABLET | Freq: Every day | ORAL | Status: DC
  Administered 2014-12-04 – 2014-12-09 (×5): 40 mg via ORAL
  Filled 2014-12-03 (×6): qty 1

## 2014-12-03 MED ORDER — CLOPIDOGREL BISULFATE 75 MG PO TABS
75.0000 mg | ORAL_TABLET | Freq: Every day | ORAL | Status: DC
  Administered 2014-12-04 – 2014-12-09 (×5): 75 mg via ORAL
  Filled 2014-12-03 (×6): qty 1

## 2014-12-03 MED ORDER — VANCOMYCIN HCL 10 G IV SOLR
1500.0000 mg | Freq: Once | INTRAVENOUS | Status: AC
  Administered 2014-12-04: 1500 mg via INTRAVENOUS
  Filled 2014-12-03 (×3): qty 1500

## 2014-12-03 MED ORDER — PANCRELIPASE (LIP-PROT-AMYL) 36000-114000 UNITS PO CPEP
72000.0000 [IU] | ORAL_CAPSULE | Freq: Three times a day (TID) | ORAL | Status: DC
  Administered 2014-12-04: 48000 [IU] via ORAL
  Administered 2014-12-05 – 2014-12-09 (×10): 72000 [IU] via ORAL
  Filled 2014-12-03 (×4): qty 2
  Filled 2014-12-03: qty 6
  Filled 2014-12-03 (×15): qty 2

## 2014-12-03 MED ORDER — FENTANYL CITRATE 0.05 MG/ML IJ SOLN
50.0000 ug | Freq: Once | INTRAMUSCULAR | Status: AC
  Administered 2014-12-03: 50 ug via INTRAVENOUS
  Filled 2014-12-03: qty 2

## 2014-12-03 MED ORDER — PIPERACILLIN-TAZOBACTAM 3.375 G IVPB
3.3750 g | Freq: Three times a day (TID) | INTRAVENOUS | Status: DC
  Administered 2014-12-04 – 2014-12-06 (×6): 3.375 g via INTRAVENOUS
  Filled 2014-12-03 (×9): qty 50

## 2014-12-03 MED ORDER — SODIUM CHLORIDE 0.45 % IV SOLN
INTRAVENOUS | Status: DC
  Administered 2014-12-03 – 2014-12-07 (×5): via INTRAVENOUS

## 2014-12-03 MED ORDER — ACETAMINOPHEN 325 MG PO TABS
650.0000 mg | ORAL_TABLET | Freq: Four times a day (QID) | ORAL | Status: DC | PRN
  Filled 2014-12-03: qty 2

## 2014-12-03 MED ORDER — METOPROLOL TARTRATE 25 MG PO TABS
25.0000 mg | ORAL_TABLET | Freq: Two times a day (BID) | ORAL | Status: DC
  Administered 2014-12-03 – 2014-12-09 (×11): 25 mg via ORAL
  Filled 2014-12-03 (×13): qty 1

## 2014-12-03 MED ORDER — ATORVASTATIN CALCIUM 40 MG PO TABS
40.0000 mg | ORAL_TABLET | Freq: Every day | ORAL | Status: DC
  Administered 2014-12-04 – 2014-12-09 (×5): 40 mg via ORAL
  Filled 2014-12-03 (×8): qty 1

## 2014-12-03 MED ORDER — LEVALBUTEROL HCL 1.25 MG/3ML IN NEBU
0.6300 mg | INHALATION_SOLUTION | RESPIRATORY_TRACT | Status: DC | PRN
  Administered 2014-12-04 (×2): 0.63 mg via RESPIRATORY_TRACT
  Filled 2014-12-03 (×2): qty 1.52
  Filled 2014-12-03: qty 0.5
  Filled 2014-12-03 (×2): qty 1.52

## 2014-12-03 MED ORDER — SPIRONOLACTONE 25 MG PO TABS
25.0000 mg | ORAL_TABLET | Freq: Every day | ORAL | Status: DC
  Administered 2014-12-04 – 2014-12-09 (×5): 25 mg via ORAL
  Filled 2014-12-03 (×8): qty 1

## 2014-12-03 MED ORDER — ONDANSETRON HCL 4 MG PO TABS: 4.0000 mg | ORAL_TABLET | Freq: Four times a day (QID) | ORAL | Status: DC | PRN

## 2014-12-03 MED ORDER — ISOSORBIDE MONONITRATE ER 30 MG PO TB24
30.0000 mg | ORAL_TABLET | Freq: Every day | ORAL | Status: DC
  Administered 2014-12-04 – 2014-12-09 (×5): 30 mg via ORAL
  Filled 2014-12-03 (×7): qty 1

## 2014-12-03 MED ORDER — INSULIN ASPART 100 UNIT/ML ~~LOC~~ SOLN
0.0000 [IU] | Freq: Three times a day (TID) | SUBCUTANEOUS | Status: DC
  Administered 2014-12-04 – 2014-12-06 (×4): 3 [IU] via SUBCUTANEOUS
  Administered 2014-12-06: 2 [IU] via SUBCUTANEOUS
  Administered 2014-12-07: 15 [IU] via SUBCUTANEOUS
  Administered 2014-12-07: 3 [IU] via SUBCUTANEOUS
  Administered 2014-12-07: 5 [IU] via SUBCUTANEOUS
  Administered 2014-12-08: 15 [IU] via SUBCUTANEOUS
  Administered 2014-12-08: 5 [IU] via SUBCUTANEOUS
  Administered 2014-12-08: 8 [IU] via SUBCUTANEOUS
  Administered 2014-12-09 (×2): 11 [IU] via SUBCUTANEOUS

## 2014-12-03 MED ORDER — BUDESONIDE-FORMOTEROL FUMARATE 160-4.5 MCG/ACT IN AERO
1.0000 | INHALATION_SPRAY | Freq: Two times a day (BID) | RESPIRATORY_TRACT | Status: DC | PRN
  Filled 2014-12-03: qty 6

## 2014-12-03 MED ORDER — METOLAZONE 5 MG PO TABS
2.5000 mg | ORAL_TABLET | Freq: Every day | ORAL | Status: DC
  Administered 2014-12-04 – 2014-12-09 (×5): 2.5 mg via ORAL
  Filled 2014-12-03: qty 0.5
  Filled 2014-12-03 (×6): qty 1

## 2014-12-03 NOTE — Progress Notes (Signed)
ANTIBIOTIC CONSULT NOTE - INITIAL  Pharmacy Consult for Vancocin and Zosyn Indication: wound infection w/ gangrene/cellulitis  Allergies  Allergen Reactions  . Levaquin [Levofloxacin] Hives    Patient Measurements: Height: 5\' 10"  (177.8 cm) Weight: 166 lb 0.1 oz (75.3 kg) IBW/kg (Calculated) : 73  Vital Signs: Temp: 98.7 F (37.1 C) (01/30 2120) Temp Source: Oral (01/30 2120) BP: 156/70 mmHg (01/30 2120) Pulse Rate: 109 (01/30 2120)  Labs:  Recent Labs  12/03/14 1710  WBC 15.3*  HGB 9.7*  PLT 548*  CREATININE 1.30   Estimated Creatinine Clearance: 53.8 mL/min (by C-G formula based on Cr of 1.3).  Medical History: Past Medical History  Diagnosis Date  . Diabetes mellitus   . Hypertension   . Pancreatitis   . Neuropathy   . Hyperlipemia   . Peripheral vascular disease     a. 10/2013: severely abnormal ABIs, for OP evaluation.  [name] GERD (gastroesophageal reflux disease)   . S/P colonoscopy July 2003    hyperplastic polyp, diverticulosis, anal papilla and internal hemorrhoids r  . S/P endoscopy 2003    Dr. Braulio Bosch: erosive reflux esophagitis , PUD  . PUD (peptic ulcer disease)     diagnosed via EGD by Dr. Braulio Bosch at [place]  . Diverticulosis   . Hemorrhoids   . Coronary artery disease      a. BMS to LAD 1999. b. HSRA to LAD and stent to RCA 2007. c. NSTEMI/CABG x 3 in 04/2010. d. NSTEMI 10/2013 - secondary to demand ischemia in setting of COPD exacerbation with severe underlying CAD - cath with 3/3 patent grafts, for medical therapy.   [name] COPD (chronic obstructive pulmonary disease)     a. Ongoing tobacco abuse.  . Arthritis   . Neutropenia     a. Dx 10/2013 on labs - instructed to f/u PCP.  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   . Ischemic cardiomyopathy     a. previously EF 30% in 2011. b. 10/2013: EF 45-50% by echo.  . Alcoholism     a. Remote alcoholism  . Cleft  palate     a.Chronic cleft palate from a traumatic injury as a child.    Medications:  Prescriptions prior to admission  Medication Sig Dispense Refill Last Dose  . albuterol-ipratropium (COMBIVENT) 18-103 MCG/ACT inhaler Inhale 1-2 puffs into the lungs every 6 (six) hours as needed for wheezing or shortness of breath.   12/03/2014  . aspirin 325 MG tablet Take 325 mg by mouth daily.   12/03/2014  . atorvastatin (LIPITOR) 40 MG tablet Take 40 mg by mouth daily.   12/03/2014  . clopidogrel (PLAVIX) 75 MG tablet Take 75 mg by mouth daily.   12/03/2014  . doxycycline (VIBRAMYCIN) 100 MG capsule Take 100 mg by mouth 2 (two) times daily.   12/03/2014  . fenofibrate 160 MG tablet Take 160 mg by mouth daily with breakfast.    12/03/2014  . furosemide (LASIX) 20 MG tablet Take 40 mg by mouth daily.   12/03/2014  . gabapentin (NEURONTIN) 100 MG capsule Take 300 mg by mouth at bedtime.    12/02/2014  . glipiZIDE (GLUCOTROL XL) 2.5 MG 24 hr tablet Take 2.5 mg by mouth daily.    12/03/2014  . HYDROcodone-acetaminophen (NORCO/VICODIN) 5-325 MG per tablet Take 1 tablet by mouth 4 (four) times daily as needed for moderate pain.   Past Week  . insulin glargine (LANTUS) 100 UNIT/ML  injection Inject 15-30 Units into the skin daily.    12/02/2014  . isosorbide mononitrate (IMDUR) 30 MG 24 hr tablet Take 1 tablet (30 mg total) by mouth daily. 30 tablet 0 12/03/2014  . levalbuterol (XOPENEX) 1.25 MG/3ML nebulizer solution Take 0.63 mg by nebulization every 4 (four) hours as needed for wheezing or shortness of breath. 72 mL 0 12/02/2014  . lisinopril (PRINIVIL,ZESTRIL) 20 MG tablet Take 40 mg by mouth daily.    12/03/2014  . loratadine (CLARITIN) 10 MG tablet Take 10 mg by mouth daily.   12/03/2014  . metFORMIN (GLUCOPHAGE) 1000 MG tablet Take 1,000 mg by mouth daily.   12/03/2014  . metolazone (ZAROXOLYN) 2.5 MG tablet TAKE ONE (1) TABLET EACH DAY 30 tablet 0 12/03/2014  . metoprolol tartrate (LOPRESSOR) 25 MG tablet Take 25 mg  by mouth 2 (two) times daily.    12/03/2014 at 0830  . nitroGLYCERIN (NITROSTAT) 0.4 MG SL tablet Place 0.4 mg under the tongue every 5 (five) minutes x 3 doses as needed for chest pain.    Past Month  . Pancrelipase, Lip-Prot-Amyl, (CREON) 24000 UNITS CPEP Take 48,000-72,000 Units by mouth See admin instructions. Take 72000 units 3 times daily with all meals and take 48000 units with snacks   12/03/2014  . pantoprazole (PROTONIX) 40 MG tablet Take 1 tablet (40 mg total) by mouth daily. 30 tablet 1 12/03/2014  . spironolactone (ALDACTONE) 25 MG tablet Take 25 mg by mouth daily.     12/03/2014  . SYMBICORT 160-4.5 MCG/ACT inhaler Inhale 1-2 puffs into the lungs 2 (two) times daily as needed (shorntess of breath).    Past Week  . tiotropium (SPIRIVA) 18 MCG inhalation capsule Place 18 mcg into inhaler and inhale daily.   12/03/2014   Scheduled:  . [START ON 12/04/2014] aspirin  325 mg Oral Daily  . [START ON 12/04/2014] atorvastatin  40 mg Oral Daily  . [START ON 12/04/2014] clopidogrel  75 mg Oral Daily  . [START ON 12/04/2014] furosemide  40 mg Oral Daily  . gabapentin  300 mg Oral QHS  . heparin  4,000 Units Intravenous Once  . [START ON 12/04/2014] insulin aspart  0-15 Units Subcutaneous TID WC  . [START ON 12/04/2014] isosorbide mononitrate  30 mg Oral Daily  . [START ON 12/04/2014] lipase/protease/amylase  72,000 Units Oral TID WC  . [START ON 12/04/2014] lisinopril  40 mg Oral Daily  . [START ON 12/04/2014] metolazone  2.5 mg Oral Daily  . metoprolol tartrate  25 mg Oral BID  . nicotine  14 mg Transdermal Daily  . pantoprazole  40 mg Oral Daily  . piperacillin-tazobactam  3.375 g Intravenous Once  . sodium chloride  3 mL Intravenous Q12H  . [START ON 12/04/2014] spironolactone  25 mg Oral Daily  . [START ON 12/04/2014] tiotropium  18 mcg Inhalation Daily  . vancomycin  1,500 mg Intravenous Once   Infusions:  . sodium chloride    . heparin      Assessment: 74yo male tx'd from [place] for  generalized weakness w/ open sore and purulent drainage of L ankle, pain worsening, appears gangrenous w/ cellulitis, to begin IV ABX w/ possible amputation.  Goal of Therapy:  Vancomycin trough level 15-20 mcg/ml  Plan:  Vanc 1.5g and Zosyn 3.375g IV ordered at [facility]; will continue with vancomycin 750mg  IV Q12H and Zosyn 3.375g IV Q8H and monitor CBC, Cx, levels prn.  Wynona Neat, PharmD, BCPS  12/03/2014,10:11 PM

## 2014-12-03 NOTE — ED Notes (Addendum)
cbg in triage 288.

## 2014-12-03 NOTE — ED Provider Notes (Signed)
CSN: [id number]     Arrival date & time 12/03/14  1519 History  This chart was scribed for Ezequiel Essex, MD by Jeanell Sparrow, ED Scribe. This patient was seen in room APA05/APA05 and the patient's care was started at 4:48 PM.   Chief Complaint  Patient presents with  . Leg Swelling   The history is provided by the patient. No language interpreter was used.    HPI Comments: Stephen [name] is a 74 y.o. male who presents to the Emergency Department complaining of constant moderate left leg pain that started about a month ago. He reports that he fell about a month ago and has not been able to ambulate since then. He denies any LOC or head injury. He states that the pain today was worse, so he came to the ED today. He reports that he has been taking hydrocodone without any relief. He states that movement of his left leg exacerbates the pain. He reports that he has also been having BLE weakness and swelling, and he has some sores on his BLE that has been there for a while. He states that he has a hx of leg surgery, DM, groin stents, heart stents, heart failure, and use of blood thinners. He denies any fever, SOB, chest pain, or abdominal pain.   Past Medical History  Diagnosis Date  . Diabetes mellitus   . Hypertension   . Pancreatitis   . Neuropathy   . Hyperlipemia   . Peripheral vascular disease     a. 10/2013: severely abnormal ABIs, for OP evaluation.  [name] GERD (gastroesophageal reflux disease)   . S/P colonoscopy July 2003    hyperplastic polyp, diverticulosis, anal papilla and internal hemorrhoids r  . S/P endoscopy 2003    Dr. Braulio Bosch: erosive reflux esophagitis , PUD  . PUD (peptic ulcer disease)     diagnosed via EGD by Dr. Braulio Bosch at [place]  . Diverticulosis   . Hemorrhoids   . Coronary artery disease      a. BMS to LAD 1999. b. HSRA to LAD and stent to RCA 2007. c. NSTEMI/CABG x 3 in 04/2010. d. NSTEMI 10/2013 - secondary to demand ischemia in setting of COPD exacerbation  with severe underlying CAD - cath with 3/3 patent grafts, for medical therapy.   [name] COPD (chronic obstructive pulmonary disease)     a. Ongoing tobacco abuse.  . Arthritis   . Neutropenia     a. Dx 10/2013 on labs - instructed to f/u PCP.  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   . Ischemic cardiomyopathy     a. previously EF 30% in 2011. b. 10/2013: EF 45-50% by echo.  . Alcoholism     a. Remote alcoholism  . 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   Past Surgical History  Procedure Laterality Date  . Coronary artery bypass graft  June 2011    X3  . Right inguinal hernia repair    . Cholecystectomy      3-4 years ago  . Appendectomy      age 77  . Cataracts    . Colonoscopy  05/12/2002    Dr. [name]- diverticulosis, anal papilla, internal hemorrhoids, rectal polyps  . [name] dilation  04/16/2012    Schatzki's ring-status post dilation and disruption/ as described above. Grade 1 esophageal varices. Small hiatal hernia.  [name]  dilation  04/16/2012    Procedure: [name] DILATION;  Surgeon: Daneil Dolin, MD;  Location: [facility];  Service: Endoscopy;  Laterality: N/A;  . Colonoscopy  08/10/2012    Procedure: COLONOSCOPY;  Surgeon: Daneil Dolin, MD;  Location: [facility];  Service: Endoscopy;  Laterality: N/A;  9:45 needs 30 mins extra /have Glucagon on hand  . Left heart catheterization with coronary/graft angiogram N/A 10/29/2013    Procedure: LEFT HEART CATHETERIZATION WITH [name];  Surgeon: Burnell Blanks, MD;  Location: [facility] CATH LAB;  Service: Cardiovascular;  Laterality: N/A;  . Abdominal aortagram N/A 09/09/2014    Procedure: ABDOMINAL Maxcine Ham;  Surgeon: Elam Dutch, MD;  Location: [facility] CATH LAB;  Service: Cardiovascular;  Laterality: N/A;  . Atherectomy Bilateral 09/23/2014    Procedure: ATHERECTOMY;  Surgeon: Elam Dutch, MD;  Location: [facility] CATH LAB;  Service: Cardiovascular;  Laterality: Bilateral;  iliacs   Family History  Problem Relation Age of Onset  . Coronary artery disease Mother   . Coronary artery disease Father   . Diabetes Sister   . Suicidality Brother   . Cirrhosis Brother   . Colon cancer Neg Hx    History  Substance Use Topics  . Smoking status: Current Every Day Smoker -- 0.75 packs/day for 63 years    Types: Cigarettes  . Smokeless tobacco: Never Used  . Alcohol Use: No     Comment: Quit 1985    Review of Systems A complete 10 system review of systems was obtained and all systems are negative except as noted in the HPI and PMH.   Allergies  Levaquin  Home Medications   Prior to Admission medications   Medication Sig Start Date End Date Taking? Authorizing Provider  albuterol-ipratropium (COMBIVENT) 18-103 MCG/ACT inhaler Inhale 1-2 puffs into the lungs every 6 (six) hours as needed for wheezing or shortness of breath. 11/02/13  Yes Dayna N Dunn, PA-C  aspirin 325 MG tablet Take 325 mg by mouth daily.   Yes Historical Provider, MD  atorvastatin (LIPITOR) 40 MG tablet Take 40 mg by mouth daily.   Yes Historical Provider, MD  clopidogrel (PLAVIX) 75 MG tablet Take 75 mg by mouth daily.   Yes Historical Provider, MD  doxycycline (VIBRAMYCIN) 100 MG capsule Take 100 mg by mouth 2 (two) times daily.   Yes Historical Provider, MD  fenofibrate 160 MG tablet Take 160 mg by mouth daily with breakfast.    Yes Historical Provider, MD  furosemide (LASIX) 20 MG tablet Take 40 mg by mouth daily.   Yes Historical Provider, MD  gabapentin (NEURONTIN) 100 MG capsule Take 300 mg by mouth at bedtime.  06/08/14  Yes Historical Provider, MD  glipiZIDE (GLUCOTROL XL) 2.5 MG 24 hr tablet Take 2.5 mg by mouth daily.    Yes Historical Provider, MD  HYDROcodone-acetaminophen (NORCO/VICODIN) 5-325 MG per tablet Take 1 tablet by mouth 4 (four) times daily as needed for moderate pain.   Yes Historical  Provider, MD  insulin glargine (LANTUS) 100 UNIT/ML injection Inject 15-30 Units into the skin daily.    Yes Historical Provider, MD  isosorbide mononitrate (IMDUR) 30 MG 24 hr tablet Take 1 tablet (30 mg total) by mouth daily. 08/19/14  Yes Samuella Cota, MD  levalbuterol Penne Lash) 1.25 MG/3ML nebulizer solution Take 0.63 mg by nebulization every 4 (four) hours as needed for wheezing or shortness of breath. 07/14/14  Yes Shanker Kristeen Mans, MD  lisinopril (PRINIVIL,ZESTRIL) 20 MG tablet Take 40  mg by mouth daily.    Yes Historical Provider, MD  loratadine (CLARITIN) 10 MG tablet Take 10 mg by mouth daily.   Yes Historical Provider, MD  metFORMIN (GLUCOPHAGE) 1000 MG tablet Take 1,000 mg by mouth daily.   Yes Historical Provider, MD  metolazone (ZAROXOLYN) 2.5 MG tablet TAKE ONE (1) TABLET EACH DAY 10/03/14  Yes Lendon Colonel, NP  metoprolol tartrate (LOPRESSOR) 25 MG tablet Take 25 mg by mouth 2 (two) times daily.    Yes Historical Provider, MD  nitroGLYCERIN (NITROSTAT) 0.4 MG SL tablet Place 0.4 mg under the tongue every 5 (five) minutes x 3 doses as needed for chest pain.    Yes Historical Provider, MD  Pancrelipase, Lip-Prot-Amyl, (CREON) 24000 UNITS CPEP Take 48,000-72,000 Units by mouth See admin instructions. Take 72000 units 3 times daily with all meals and take 48000 units with snacks   Yes Historical Provider, MD  pantoprazole (PROTONIX) 40 MG tablet Take 1 tablet (40 mg total) by mouth daily. 11/02/13  Yes Dayna N Dunn, PA-C  spironolactone (ALDACTONE) 25 MG tablet Take 25 mg by mouth daily.     Yes Historical Provider, MD  SYMBICORT 160-4.5 MCG/ACT inhaler Inhale 1-2 puffs into the lungs 2 (two) times daily as needed (shorntess of breath).  07/04/12  Yes Historical Provider, MD  tiotropium (SPIRIVA) 18 MCG inhalation capsule Place 18 mcg into inhaler and inhale daily.   Yes Historical Provider, MD   BP 142/90 mmHg  Pulse 92  Temp(Src) 97.7 F (36.5 C) (Oral)  Resp 16  SpO2  99% Physical Exam  Constitutional: He is oriented to person, place, and time. He appears well-developed and well-nourished. No distress.  HENT:  Head: Normocephalic and atraumatic.  Mouth/Throat: Oropharynx is clear and moist. No oropharyngeal exudate.  Dry mucous membranes.  Eyes: Conjunctivae and EOM are normal. Pupils are equal, round, and reactive to light.  Neck: Normal range of motion. Neck supple.  No meningismus.  Cardiovascular: Normal rate, regular rhythm, normal heart sounds and intact distal pulses.   No murmur heard. Equal femoral pulse. Unable to palpate PT or DP. DP dopplerable.   Pulmonary/Chest: Effort normal and breath sounds normal. No respiratory distress.  Abdominal: Soft. There is no tenderness. There is no rebound and no guarding.  Musculoskeletal: Normal range of motion. He exhibits edema. He exhibits no tenderness.  +2 edema to BLE.   Neurological: He is alert and oriented to person, place, and time. No cranial nerve deficit. He exhibits normal muscle tone. Coordination normal.  No ataxia on finger to nose bilaterally. No pronator drift. 5/5 strength throughout. CN 2-12 intact.Equal grip strength. Sensation intact.  Skin: Skin is warm.  Multiple areas of old wounds. 5 cm of black eschar to medial maelolus with foul smelling drainage and surrounding erythema.   Psychiatric: He has a normal mood and affect. His behavior is normal.  Nursing note and vitals reviewed.       ED Course  Procedures (including critical care time) DIAGNOSTIC STUDIES: Oxygen Saturation is 99% on RA, normal by my interpretation.    COORDINATION OF CARE: 4:52 PM- Pt advised of plan for treatment which includes medication, radiology, and labs and pt agrees.  Labs Review Labs Reviewed  CBC WITH DIFFERENTIAL/PLATELET - Abnormal; Notable for the following:    WBC 15.3 (*)    RBC 3.26 (*)    Hemoglobin 9.7 (*)    HCT 29.5 (*)    Platelets 548 (*)    Neutrophils Relative % 83 (*)  Neutro Abs 12.6 (*)    Lymphocytes Relative 10 (*)    Monocytes Absolute 1.1 (*)    All other components within normal limits  BASIC METABOLIC PANEL - Abnormal; Notable for the following:    Sodium 132 (*)    Chloride 90 (*)    Glucose, Bld 250 (*)    BUN 31 (*)    GFR calc non Af Amer 54 (*)    GFR calc Af Amer 62 (*)    All other components within normal limits  TROPONIN I - Abnormal; Notable for the following:    Troponin I 0.09 (*)    All other components within normal limits  GLUCOSE, CAPILLARY - Abnormal; Notable for the following:    Glucose-Capillary 201 (*)    All other components within normal limits  CBG MONITORING, ED - Abnormal; Notable for the following:    Glucose-Capillary 288 (*)    All other components within normal limits  CBG MONITORING, ED - Abnormal; Notable for the following:    Glucose-Capillary 210 (*)    All other components within normal limits  MRSA PCR SCREENING  PROTIME-INR  COMPREHENSIVE METABOLIC PANEL  CBC  TROPONIN I  TROPONIN I  TROPONIN I  HEMOGLOBIN A1C  HEPARIN LEVEL (UNFRACTIONATED)    Imaging Review Dg Chest 1 View  12/03/2014   CLINICAL DATA:  Bilateral lower extremity swelling.  EXAM: CHEST - 1 VIEW  COMPARISON:  PA and lateral chest 10/05/2014 the hand 10/23/2013.  FINDINGS: Heart size is normal. The patient is status post CABG. Lungs are clear. No pneumothorax or pleural effusion.  IMPRESSION: No acute disease.   Electronically Signed   By: Inge Rise M.D.   On: 12/03/2014 17:47   Dg Tibia/fibula Left  12/03/2014   CLINICAL DATA:  Bilateral lower extremity swelling. Wound over the medial malleolus of the left ankle.  EXAM: LEFT TIBIA AND FIBULA - 2 VIEW  COMPARISON:  None.  FINDINGS: Wound over the medial malleolus is identified. There may be an additional wound posteriorly along the left lower leg. a No acute bony or joint abnormality is seen. Atherosclerosis is noted.  IMPRESSION: Skin wounds without underlying acute bony  or joint abnormality.  Atherosclerosis.   Electronically Signed   By: Inge Rise M.D.   On: 12/03/2014 17:49   Dg Ankle Complete Left  12/03/2014   CLINICAL DATA:  Bilateral leg swelling, bilateral ankle swelling, large round necrotic sore overlying medial malleolus on left ankle  EXAM: LEFT ANKLE COMPLETE - 3+ VIEW  COMPARISON:  None.  FINDINGS: No fracture.  Ankle mortise is normally spaced and aligned.  There is a soft tissue defect extending to the surface of the bone overlying the medial malleolus. No bone resorption is seen to suggest osteomyelitis. There is no soft tissue air. No radiopaque foreign body.  Mild diffuse subcutaneous soft tissue edema is noted of the lower leg and ankle.  IMPRESSION: No fracture.  No evidence of osteomyelitis.   Electronically Signed   By: Lajean Manes M.D.   On: 12/03/2014 17:47   Dg Ankle Complete Right  12/03/2014   CLINICAL DATA:  Bilateral lower extremity swelling and pain. Initial encounter.  EXAM: RIGHT ANKLE - COMPLETE 3+ VIEW  COMPARISON:  None.  FINDINGS: Soft tissue swelling is seen about the right ankle. No underlying acute bony or joint abnormality is identified. No soft tissue gas collection is seen. Atherosclerosis is noted.  IMPRESSION: Soft tissue swelling.  Otherwise negative.   Electronically Signed  By: Inge Rise M.D.   On: 12/03/2014 17:50     EKG Interpretation   Date/Time:  Saturday December 03 2014 18:45:02 EST Ventricular Rate:  110 PR Interval:  153 QRS Duration: 145 QT Interval:  362 QTC Calculation: 490 R Axis:   114 Text Interpretation:  Sinus tachycardia Atrial premature complex  Nonspecific intraventricular conduction delay Probable anteroseptal  infarct, recent Baseline wander in lead(s) V2 No significant change was  found Confirmed by Wyvonnia Dusky  MD, Adorian Gwynne [phone]) on 12/03/2014 7:05:57 PM      MDM   Final diagnoses:  Gangrene  Elevated troponin   Patient with peripheral vascular disease presenting with  bilateral lower extremity pain, weakness, inability to walk. He has area of black eschar to left medial malleolus concerning for gangrene. He has foul-smelling drainage. Palpable femoral pulses.  Dopplerable DP pulses. Afebrile.  Previous vascular surgery note indicates patient is not a candidate for femoropopliteal bypass.  D/w Dr. Trula Slade who reviewed patient's photographs. He feels patient will likely need amputation. He requests hospital admission to [facility] and he will consult.  Labs, cultures, antibiotics.  EKG with worsening inferior lateral ST depressions. Patient denies chest pain. Troponin 0.09. Discussed with cardiology Dr. Tommi Rumps. He agrees with aspirin and heparin drip. He will consult on patient when he arrives at [facility].   Discussed with Dr. Marily Memos who will admit patient for triad hospitalists  CRITICAL CARE Performed by: Ezequiel Essex Total critical care time: 30 Critical care time was exclusive of separately billable procedures and treating other patients. Critical care was necessary to treat or prevent imminent or life-threatening deterioration. Critical care was time spent personally by me on the following activities: development of treatment plan with patient and/or surrogate as well as nursing, discussions with consultants, evaluation of patient's response to treatment, examination of patient, obtaining history from patient or surrogate, ordering and performing treatments and interventions, ordering and review of laboratory studies, ordering and review of radiographic studies, pulse oximetry and re-evaluation of patient's condition.  I personally performed the services described in this documentation, which was scribed in my presence. The recorded information has been reviewed and is accurate.      Ezequiel Essex, MD 12/04/14 [phone]

## 2014-12-03 NOTE — ED Notes (Signed)
Placed on 3L O2 [place].

## 2014-12-03 NOTE — ED Notes (Signed)
Informed son Teofilo Lupinacci of pt's transfer to [facility]. Son can be reached at [phone]

## 2014-12-03 NOTE — ED Notes (Signed)
Pulses found with doppler. EDP at bedside while assessing pulses with doppler.

## 2014-12-03 NOTE — Progress Notes (Signed)
ANTICOAGULATION CONSULT NOTE - Initial Consult  Pharmacy Consult for Heparin Indication: chest pain/ACS  Allergies  Allergen Reactions  . Levaquin [Levofloxacin] Hives   Patient Measurements:   Heparin Dosing Weight: 77Kg (last recorded weight) Have asked nursing to update HEIGHT AND WEIGHT  Vital Signs: Temp: 98.5 F (36.9 C) (01/30 2002) Temp Source: Oral (01/30 2002) BP: 168/80 mmHg (01/30 1930) Pulse Rate: 98 (01/30 1945)  Labs:  Recent Labs  12/03/14 1710  HGB 9.7*  HCT 29.5*  PLT 548*  LABPROT 13.0  INR 0.97  CREATININE 1.30  TROPONINI 0.09*   CrCl cannot be calculated (Unknown ideal weight.).  Medical History: Past Medical History  Diagnosis Date  . Diabetes mellitus   . Hypertension   . Pancreatitis   . Neuropathy   . Hyperlipemia   . Peripheral vascular disease     a. 10/2013: severely abnormal ABIs, for OP evaluation.  [name] GERD (gastroesophageal reflux disease)   . S/P colonoscopy July 2003    hyperplastic polyp, diverticulosis, anal papilla and internal hemorrhoids r  . S/P endoscopy 2003    Dr. Braulio Bosch: erosive reflux esophagitis , PUD  . PUD (peptic ulcer disease)     diagnosed via EGD by Dr. Braulio Bosch at [place]  . Diverticulosis   . Hemorrhoids   . Coronary artery disease      a. BMS to LAD 1999. b. HSRA to LAD and stent to RCA 2007. c. NSTEMI/CABG x 3 in 04/2010. d. NSTEMI 10/2013 - secondary to demand ischemia in setting of COPD exacerbation with severe underlying CAD - cath with 3/3 patent grafts, for medical therapy.   [name] COPD (chronic obstructive pulmonary disease)     a. Ongoing tobacco abuse.  . Arthritis   . Neutropenia     a. Dx 10/2013 on labs - instructed to f/u PCP.  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   . Ischemic cardiomyopathy     a. previously EF 30% in 2011. b. 10/2013: EF 45-50% by echo.  . Alcoholism     a. Remote alcoholism  .  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   Assessment: Asked to initiate Heparin Protocol for ACS.  Last recorded weight = 77Kg.    Goal of Therapy:  Heparin level 0.3-0.7 units/ml Monitor platelets by anticoagulation protocol: Yes   Plan:  Heparin 4000 units IV now x 1 Heparin infusion at 12 units/Kg/Hr Heparin level in 6-8 hours then daily CBC daily while on Heparin  [place] Crane, Veera Stapleton A 12/03/2014,8:19 PM

## 2014-12-03 NOTE — ED Notes (Addendum)
Pt reports fell x6 weeks ago. Pt reports BLE pain, weakness ever since. Moderate redness,several diabetic sores of various sizes noted to BLE, +2 swelling noted to BLE. Malodorous sites.Faint pulses palpated in triage in BLE.

## 2014-12-03 NOTE — H&P (Signed)
[facility] History and Physical  Stephen [name] XBM:[id number] DOB: [birth year] DOA: 12/03/2014  Referring physician: D [place] PCP: Reynold Bowen, NT   Chief Complaint: Leg pain  HPI: Stephen [name] is a 74 y.o. male  Leg pain: L. Getting worse. Located in L ankle w/o radiation. Open sore and w/ purulent discharge. Open wound on ankle present for 8-9 mo. Associated w/ generalized body aches. Deneis fevers, SOB, palpitations. Hydrocodone w/o benefit in ankle. Pain is worse w/ movement.   CP a couple nights ago that was relieved w/ "CP pill." but pt unsure of what medicine it was he took.   CBG at home range from 400-500 routinely  Review of Systems:  Constitutional:  No weight loss, night sweats, Fevers, chills, fatigue.  HEENT:  No headaches, Difficulty swallowing,Tooth/dental problems,Sore throat,  No sneezing, itching, ear ache, nasal congestion, post nasal drip,  Cardio-vascular:  No  Orthopnea, PND,  anasarca, dizziness, palpitations  GI:  No heartburn, indigestion, abdominal pain, nausea, vomiting, diarrhea, change in bowel habits, loss of appetite  Resp:   No shortness of breath with exertion or at rest. No excess mucus, no productive cough, No non-productive cough, No coughing up of blood.No change in color of mucus.No wheezing.No chest wall deformity  Skin: Per HPI GU:  no dysuria, change in color of urine, no urgency or frequency. No flank pain.  Musculoskeletal:   No back pain.  Psych:  No change in mood or affect. No depression or anxiety. No memory loss.   Past Medical History  Diagnosis Date  . Diabetes mellitus   . Hypertension   . Pancreatitis   . Neuropathy   . Hyperlipemia   . Peripheral vascular disease     a. 10/2013: severely abnormal ABIs, for OP evaluation.  [name] GERD (gastroesophageal reflux disease)   . S/P colonoscopy July 2003    hyperplastic polyp, diverticulosis, anal papilla and internal hemorrhoids r  . S/P endoscopy 2003   Dr. Braulio Bosch: erosive reflux esophagitis , PUD  . PUD (peptic ulcer disease)     diagnosed via EGD by Dr. Braulio Bosch at [facility]  . Diverticulosis   . Hemorrhoids   . Coronary artery disease      a. BMS to LAD 1999. b. HSRA to LAD and stent to RCA 2007. c. NSTEMI/CABG x 3 in 04/2010. d. NSTEMI 10/2013 - secondary to demand ischemia in setting of COPD exacerbation with severe underlying CAD - cath with 3/3 patent grafts, for medical therapy.   [name] COPD (chronic obstructive pulmonary disease)     a. Ongoing tobacco abuse.  . Arthritis   . Neutropenia     a. Dx 10/2013 on labs - instructed to f/u PCP.  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   . Ischemic cardiomyopathy     a. previously EF 30% in 2011. b. 10/2013: EF 45-50% by echo.  . Alcoholism     a. Remote alcoholism  . 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   Past Surgical History  Procedure Laterality Date  . Coronary artery bypass graft  June 2011    X3  . Right inguinal hernia repair    . Cholecystectomy      3-4 years ago  . Appendectomy      age 46  . Cataracts    . Colonoscopy  05/12/2002  Dr. [name]- diverticulosis, anal papilla, internal hemorrhoids, rectal polyps  . [name] dilation  04/16/2012    Schatzki's ring-status post dilation and disruption/ as described above. Grade 1 esophageal varices. Small hiatal hernia.  [name] dilation  04/16/2012    Procedure: [name] DILATION;  Surgeon: Daneil Dolin, MD;  Location: [facility];  Service: Endoscopy;  Laterality: N/A;  . Colonoscopy  08/10/2012    Procedure: COLONOSCOPY;  Surgeon: Daneil Dolin, MD;  Location: [facility];  Service: Endoscopy;  Laterality: N/A;  9:45 needs 30 mins extra /have Glucagon on hand  . Left heart catheterization with coronary/graft angiogram N/A 10/29/2013    Procedure: LEFT HEART CATHETERIZATION WITH [name];  Surgeon: Burnell Blanks, MD;  Location: [facility] CATH LAB;  Service: Cardiovascular;  Laterality: N/A;  . Abdominal aortagram N/A 09/09/2014    Procedure: ABDOMINAL Maxcine Ham;  Surgeon: Elam Dutch, MD;  Location: [facility] CATH LAB;  Service: Cardiovascular;  Laterality: N/A;  . Atherectomy Bilateral 09/23/2014    Procedure: ATHERECTOMY;  Surgeon: Elam Dutch, MD;  Location: [facility] CATH LAB;  Service: Cardiovascular;  Laterality: Bilateral;  iliacs   Social History:  reports that he has been smoking Cigarettes.  He has a 47.25 pack-year smoking history. He has never used smokeless tobacco. He reports that he does not drink alcohol or use illicit drugs.  Allergies  Allergen Reactions  . Levaquin [Levofloxacin] Hives    Family History  Problem Relation Age of Onset  . Coronary artery disease Mother   . Coronary artery disease Father   . Diabetes Sister   . Suicidality Brother   . Cirrhosis Brother   . Colon cancer Neg Hx      Prior to Admission medications   Medication Sig Start Date End Date Taking? Authorizing Provider  albuterol-ipratropium (COMBIVENT) 18-103 MCG/ACT inhaler Inhale 1-2 puffs into the lungs every 6 (six) hours as needed for wheezing or shortness of breath. 11/02/13   Dayna N Dunn, PA-C  aspirin 325 MG tablet Take 325 mg by mouth daily.    Historical Provider, MD  atorvastatin (LIPITOR) 40 MG tablet Take 40 mg by mouth daily.    Historical Provider, MD  clopidogrel (PLAVIX) 75 MG tablet Take 75 mg by mouth daily.    Historical Provider, MD  doxycycline (VIBRAMYCIN) 100 MG capsule Take 100 mg by mouth 2 (two) times daily.    Historical Provider, MD  fenofibrate 160 MG tablet Take 160 mg by mouth daily with breakfast.     Historical Provider, MD  furosemide (LASIX) 20 MG tablet Take 40 mg by mouth daily.    Historical Provider, MD  gabapentin (NEURONTIN) 100 MG capsule Take 300 mg by mouth at bedtime.  06/08/14   Historical Provider, MD  glipiZIDE (GLUCOTROL  XL) 2.5 MG 24 hr tablet Take 2.5 mg by mouth daily.     Historical Provider, MD  HYDROcodone-acetaminophen (NORCO/VICODIN) 5-325 MG per tablet Take 1 tablet by mouth 4 (four) times daily as needed for moderate pain.    Historical Provider, MD  insulin glargine (LANTUS) 100 UNIT/ML injection Inject 20 Units into the skin daily.    Historical Provider, MD  isosorbide mononitrate (IMDUR) 30 MG 24 hr tablet Take 1 tablet (30 mg total) by mouth daily. 08/19/14   Samuella Cota, MD  levalbuterol Penne Lash) 1.25 MG/3ML nebulizer solution Take 0.63 mg by nebulization every 4 (four) hours as needed for wheezing or shortness of breath. 07/14/14   Shanker Kristeen Mans, MD  lisinopril (PRINIVIL,ZESTRIL) 20 MG tablet Take 40 mg by mouth daily.     Historical Provider, MD  loratadine (CLARITIN) 10 MG tablet Take 10 mg by mouth daily.    Historical Provider, MD  metFORMIN (GLUCOPHAGE) 1000 MG tablet Take 1,000 mg by mouth daily.    Historical Provider, MD  metolazone (ZAROXOLYN) 2.5 MG tablet TAKE ONE (1) TABLET EACH DAY 10/03/14   Lendon Colonel, NP  metoprolol tartrate (LOPRESSOR) 25 MG tablet Take 25 mg by mouth 2 (two) times daily.     Historical Provider, MD  nitroGLYCERIN (NITROSTAT) 0.4 MG SL tablet Place 0.4 mg under the tongue every 5 (five) minutes x 3 doses as needed for chest pain.     Historical Provider, MD  Pancrelipase, Lip-Prot-Amyl, (CREON) 24000 UNITS CPEP Take 48,000-72,000 Units by mouth See admin instructions. Take 72000 units 3 times daily with all meals and take 48000 units with snacks    Historical Provider, MD  pantoprazole (PROTONIX) 40 MG tablet Take 1 tablet (40 mg total) by mouth daily. 11/02/13   Dayna N Dunn, PA-C  spironolactone (ALDACTONE) 25 MG tablet Take 25 mg by mouth daily.      Historical Provider, MD  SYMBICORT 160-4.5 MCG/ACT inhaler Inhale 1-2 puffs into the lungs 2 (two) times daily as needed (shorntess of breath).  07/04/12   Historical Provider, MD  tiotropium (SPIRIVA)  18 MCG inhalation capsule Place 18 mcg into inhaler and inhale daily.    Historical Provider, MD   Physical Exam: Filed Vitals:   12/03/14 1524 12/03/14 1749 12/03/14 1837 12/03/14 1900  BP: 142/90 154/97 133/110 165/74  Pulse: 92 95 98   Temp: 97.7 F (36.5 C)     TempSrc: Oral     Resp: 16 16 16 16   SpO2: 99% 98% 91%     Wt Readings from Last 3 Encounters:  11/03/14 77.111 kg (170 lb)  09/23/14 77.111 kg (170 lb)  09/09/14 78.019 kg (172 lb)    General:  Appears calm and comfortable Eyes:  PERRL, normal lids, irises & conjunctiva ENT:  grossly normal hearing, lips & tongue Neck:  no LAD, masses or thyromegaly Cardiovascular: RRR, no m/r/g. Trace LE edema Telemetry:  SR, no arrhythmias  Respiratory:  CTA bilaterally, no w/r/r. Normal respiratory effort. Abdomen:  soft, ntnd Skin: L ankle medial malleolus large necrotic wound w/ surounding erythema Musculoskeletal:  grossly normal tone BUE/BLE Psychiatric:  grossly normal mood and affect, speech fluent and appropriate Neurologic:  grossly non-focal.              Labs on Admission:  Basic Metabolic Panel:  Recent Labs Lab 12/03/14 1710  NA 132*  K 4.3  CL 90*  CO2 32  GLUCOSE 250*  BUN 31*  CREATININE 1.30  CALCIUM 8.8   Liver Function Tests: No results for input(s): AST, ALT, ALKPHOS, BILITOT, PROT, ALBUMIN in the last 168 hours. No results for input(s): LIPASE, AMYLASE in the last 168 hours. No results for input(s): AMMONIA in the last 168 hours. CBC:  Recent Labs Lab 12/03/14 1710  WBC 15.3*  NEUTROABS 12.6*  HGB 9.7*  HCT 29.5*  MCV 90.5  PLT 548*   Cardiac Enzymes:  Recent Labs Lab 12/03/14 1710  TROPONINI 0.09*    BNP (last 3 results)  Recent Labs  07/21/14 2348 08/16/14 1351 08/17/14 0609  PROBNP 3366.0* 6508.0* 9230.0*   CBG:  Recent Labs Lab 12/03/14 1528  GLUCAP 288*    Radiological Exams on Admission: Dg Chest 1  View  12/03/2014   CLINICAL DATA:  Bilateral  lower extremity swelling.  EXAM: CHEST - 1 VIEW  COMPARISON:  PA and lateral chest 10/05/2014 the hand 10/23/2013.  FINDINGS: Heart size is normal. The patient is status post CABG. Lungs are clear. No pneumothorax or pleural effusion.  IMPRESSION: No acute disease.   Electronically Signed   By: Inge Rise M.D.   On: 12/03/2014 17:47   Dg Tibia/fibula Left  12/03/2014   CLINICAL DATA:  Bilateral lower extremity swelling. Wound over the medial malleolus of the left ankle.  EXAM: LEFT TIBIA AND FIBULA - 2 VIEW  COMPARISON:  None.  FINDINGS: Wound over the medial malleolus is identified. There may be an additional wound posteriorly along the left lower leg. a No acute bony or joint abnormality is seen. Atherosclerosis is noted.  IMPRESSION: Skin wounds without underlying acute bony or joint abnormality.  Atherosclerosis.   Electronically Signed   By: Inge Rise M.D.   On: 12/03/2014 17:49   Dg Ankle Complete Left  12/03/2014   CLINICAL DATA:  Bilateral leg swelling, bilateral ankle swelling, large round necrotic sore overlying medial malleolus on left ankle  EXAM: LEFT ANKLE COMPLETE - 3+ VIEW  COMPARISON:  None.  FINDINGS: No fracture.  Ankle mortise is normally spaced and aligned.  There is a soft tissue defect extending to the surface of the bone overlying the medial malleolus. No bone resorption is seen to suggest osteomyelitis. There is no soft tissue air. No radiopaque foreign body.  Mild diffuse subcutaneous soft tissue edema is noted of the lower leg and ankle.  IMPRESSION: No fracture.  No evidence of osteomyelitis.   Electronically Signed   By: Lajean Manes M.D.   On: 12/03/2014 17:47   Dg Ankle Complete Right  12/03/2014   CLINICAL DATA:  Bilateral lower extremity swelling and pain. Initial encounter.  EXAM: RIGHT ANKLE - COMPLETE 3+ VIEW  COMPARISON:  None.  FINDINGS: Soft tissue swelling is seen about the right ankle. No underlying acute bony or joint abnormality is identified. No soft  tissue gas collection is seen. Atherosclerosis is noted.  IMPRESSION: Soft tissue swelling.  Otherwise negative.   Electronically Signed   By: Inge Rise M.D.   On: 12/03/2014 17:50    EKG: Independently reviewed. Sinus, tach, PACs  Assessment/Plan Principal Problem:   Ulcer of left lower extremity Active Problems:   CORONARY ARTERY BYPASS GRAFT, HX OF   Hyperlipemia   PVD (peripheral vascular disease)   Essential hypertension   COPD (chronic obstructive pulmonary disease)   Gangrene   Pancreatic insufficiency   74yo M w/ PMH of PVD, DM, HTN, Pancreatic insufficiency, peripheral neuropathy, GERD, CAD/MI, HLD, CHF presenting w/ acute on chronic worsening of a L ankle wound adn elevated troponins.   L ankle dry gangrene w/ cellulitis: chronic open wound w/ acute worsening. Pt w/ extensive vascular history. Vascular consulted, Dr. Charleston Ropes, by ED and will evaluate pt when arrives at [facility]. Likely amputation. WBC  - Admit to [facility] - f/u [facility] -NPO after midnight.  - Coags  Elevated troponin: 0.09 w/ mild ST depression. L;ast CP several days ago. Cards consulted by ED, Dr. Tommi Rumps, and agree w/ Heparin drip and transfer to [facility]. Likely not a cath candidate as was recently done.  - tele - cycle troponins - f/u Cards recommendations - cont home ASA and Plavix  DM II: 9.3 last A1c in October. On SS Lantus but reported home CBG of 400-500  on a regular basis.  - Hold home Lantus for presumed NPO and surgery - SSI - A1c - Hold metformin and glipizide  Chronic systolic and diastolic CHF: no acute exacerbation. Last Echo 10/2013 w/ EF 45-50% and grade 1 diastolic dysfunction. - continue hoem lasix and metolazone, spironolactone - cont bblocker and ACEi - consider repeat Echo if decompensates.   HTN: normotensive - continue home imdur, diuretics, metop  Pancreatic insufficiency:  - continue home creon  COPD: no acute execerbation - continue spiriva,  symbicort  Neuropathic pain:  - continue neurontin  HLD: - continue statin  Tobacco: continues to smoke - nicotine patch   Code Status: DNR DVT Prophylaxis: Heparin Drip Family Communication: none Disposition Plan: transfer to [facility]. Pending further care    MERRELL, DAVID J, MD Family Medicine [facility] [email or web address] Password [email or web address]

## 2014-12-04 DIAGNOSIS — A419 Sepsis, unspecified organism: Principal | ICD-10-CM

## 2014-12-04 DIAGNOSIS — L97929 Non-pressure chronic ulcer of unspecified part of left lower leg with unspecified severity: Secondary | ICD-10-CM

## 2014-12-04 DIAGNOSIS — Z951 Presence of aortocoronary bypass graft: Secondary | ICD-10-CM

## 2014-12-04 DIAGNOSIS — I70243 Atherosclerosis of native arteries of left leg with ulceration of ankle: Secondary | ICD-10-CM

## 2014-12-04 DIAGNOSIS — R7989 Other specified abnormal findings of blood chemistry: Secondary | ICD-10-CM

## 2014-12-04 DIAGNOSIS — I739 Peripheral vascular disease, unspecified: Secondary | ICD-10-CM

## 2014-12-04 DIAGNOSIS — I214 Non-ST elevation (NSTEMI) myocardial infarction: Secondary | ICD-10-CM

## 2014-12-04 LAB — CBC
HCT: 28.7 % — ABNORMAL LOW (ref 39.0–52.0)
HEMOGLOBIN: 9 g/dL — AB (ref 13.0–17.0)
MCH: 28.4 pg (ref 26.0–34.0)
MCHC: 31.4 g/dL (ref 30.0–36.0)
MCV: 90.5 fL (ref 78.0–100.0)
Platelets: 576 10*3/uL — ABNORMAL HIGH (ref 150–400)
RBC: 3.17 MIL/uL — ABNORMAL LOW (ref 4.22–5.81)
RDW: 14.6 % (ref 11.5–15.5)
WBC: 13.7 10*3/uL — AB (ref 4.0–10.5)

## 2014-12-04 LAB — COMPREHENSIVE METABOLIC PANEL
ALK PHOS: 79 U/L (ref 39–117)
ALT: 9 U/L (ref 0–53)
AST: 13 U/L (ref 0–37)
Albumin: 2 g/dL — ABNORMAL LOW (ref 3.5–5.2)
Anion gap: 10 (ref 5–15)
BUN: 23 mg/dL (ref 6–23)
CO2: 31 mmol/L (ref 19–32)
Calcium: 8.8 mg/dL (ref 8.4–10.5)
Chloride: 93 mmol/L — ABNORMAL LOW (ref 96–112)
Creatinine, Ser: 0.99 mg/dL (ref 0.50–1.35)
GFR calc Af Amer: >90 mL/min (ref >90)
GFR calc non Af Amer: 80 mL/min — ABNORMAL LOW (ref >90)
GLUCOSE: 160 mg/dL — AB (ref 70–99)
Potassium: 3.8 mmol/L (ref 3.5–5.1)
Sodium: 134 mmol/L — ABNORMAL LOW (ref 135–145)
Total Bilirubin: 0.4 mg/dL (ref 0.3–1.2)
Total Protein: 6.2 g/dL (ref 6.0–8.3)

## 2014-12-04 LAB — TROPONIN I
TROPONIN I: 0.1 ng/mL — AB (ref <0.031)
Troponin I: 0.15 ng/mL — ABNORMAL HIGH (ref <0.031)
Troponin I: 0.08 ng/mL — ABNORMAL HIGH (ref <0.031)

## 2014-12-04 LAB — BLOOD GAS, ARTERIAL
ACID-BASE EXCESS: 6.9 mmol/L — AB (ref 0.0–2.0)
Bicarbonate: 31.4 mEq/L — ABNORMAL HIGH (ref 20.0–24.0)
DRAWN BY: 398991
O2 Content: 5 L/min
O2 Saturation: 96 %
PO2 ART: 102 mmHg — AB (ref 80.0–100.0)
Patient temperature: 98.6
TCO2: 32.9 mmol/L (ref 0–100)
pCO2 arterial: 49.2 mmHg — ABNORMAL HIGH (ref 35.0–45.0)
pH, Arterial: 7.421 (ref 7.350–7.450)

## 2014-12-04 LAB — HEPARIN LEVEL (UNFRACTIONATED)
Heparin Unfractionated: <0.1 IU/mL — ABNORMAL LOW (ref 0.30–0.70)
Heparin Unfractionated: <0.1 IU/mL — ABNORMAL LOW (ref 0.30–0.70)

## 2014-12-04 LAB — GLUCOSE, CAPILLARY
GLUCOSE-CAPILLARY: 158 mg/dL — AB (ref 70–99)
GLUCOSE-CAPILLARY: 164 mg/dL — AB (ref 70–99)
Glucose-Capillary: 74 mg/dL (ref 70–99)
Glucose-Capillary: 156 mg/dL — ABNORMAL HIGH (ref 70–99)

## 2014-12-04 LAB — MRSA PCR SCREENING: MRSA BY PCR: NEGATIVE

## 2014-12-04 MED ORDER — HEPARIN BOLUS VIA INFUSION
2000.0000 [IU] | Freq: Once | INTRAVENOUS | Status: AC
  Administered 2014-12-04: 2000 [IU] via INTRAVENOUS
  Filled 2014-12-04: qty 2000

## 2014-12-04 MED ORDER — HEPARIN (PORCINE) IN NACL 100-0.45 UNIT/ML-% IJ SOLN
1700.0000 [IU]/h | INTRAMUSCULAR | Status: DC
  Administered 2014-12-05: 1700 [IU]/h via INTRAVENOUS
  Filled 2014-12-04 (×3): qty 250

## 2014-12-04 MED ORDER — CETYLPYRIDINIUM CHLORIDE 0.05 % MT LIQD
7.0000 mL | Freq: Two times a day (BID) | OROMUCOSAL | Status: DC
  Administered 2014-12-04 – 2014-12-08 (×8): 7 mL via OROMUCOSAL

## 2014-12-04 MED ORDER — LORAZEPAM 2 MG/ML IJ SOLN
0.5000 mg | Freq: Once | INTRAMUSCULAR | Status: AC
  Administered 2014-12-04: 0.5 mg via INTRAVENOUS

## 2014-12-04 MED ORDER — LORAZEPAM 2 MG/ML IJ SOLN
INTRAMUSCULAR | Status: AC
  Filled 2014-12-04: qty 1

## 2014-12-04 NOTE — Progress Notes (Signed)
ANTICOAGULATION CONSULT NOTE - Follow Up Consult  Pharmacy Consult for Heparin Indication: CP/ACS  Allergies  Allergen Reactions  . Levaquin [Levofloxacin] Hives    Patient Measurements: Height: 5\' 10"  (177.8 cm) Weight: 166 lb 0.1 oz (75.3 kg) IBW/kg (Calculated) : 73  Vital Signs: Temp: 99.4 F (37.4 C) (01/31 0433) Temp Source: Oral (01/31 0433) BP: 154/73 mmHg (01/31 0433) Pulse Rate: 111 (01/31 0433)  Labs:  Recent Labs  12/03/14 1710 12/04/14 0418 12/04/14 0630  HGB 9.7* 9.0*  --   HCT 29.5* 28.7*  --   PLT 548* 576*  --   LABPROT 13.0  --   --   INR 0.97  --   --   HEPARINUNFRC  --   --  <0.10*  CREATININE 1.30 0.99  --   TROPONINI 0.09* 0.15*  --     Estimated Creatinine Clearance: 69.6 mL/min (by C-G formula based on Cr of 0.99).   Medications:  Scheduled:  . antiseptic oral rinse  7 mL Mouth Rinse BID  . aspirin  325 mg Oral Daily  . atorvastatin  40 mg Oral Daily  . clopidogrel  75 mg Oral Daily  . furosemide  40 mg Oral Daily  . gabapentin  300 mg Oral QHS  . insulin aspart  0-15 Units Subcutaneous TID WC  . isosorbide mononitrate  30 mg Oral Daily  . lipase/protease/amylase  72,000 Units Oral TID WC  . lisinopril  40 mg Oral Daily  . metolazone  2.5 mg Oral Daily  . metoprolol tartrate  25 mg Oral BID  . nicotine  14 mg Transdermal Daily  . pantoprazole  40 mg Oral Daily  . piperacillin-tazobactam (ZOSYN)  IV  3.375 g Intravenous Q8H  . sodium chloride  3 mL Intravenous Q12H  . spironolactone  25 mg Oral Daily  . tiotropium  18 mcg Inhalation Daily  . vancomycin  750 mg Intravenous Q12H   Infusions:  . sodium chloride 75 mL/hr at 12/03/14 2218  . heparin 900 Units/hr (12/04/14 0500)    Assessment:  CC: Stephen [name] is an 74 y.o. male admitted on 12/03/2014 presenting with BLE pain and weakness.  PMH: PVD, DM, HTN, Pancreatic insufficiency, peripheral neuropathy, GERD, CAD/MI, HLD, CHF   Anticoagulation/Heme: Heparin for ACS - HL  undetectable.  INR admit 0.97.  Hgb trend down 9.7 >> 9.0, plt up 576.  No reports of bleeding at this time.    Nephrology: Crcl 65-70 mL/min.  K wnl, K slightly low, Ca wnl.    Goal of Therapy:  Heparin level 0.3-0.7 units/ml Monitor platelets by anticoagulation protocol: Yes   Plan: - Heparin 2000 unit bolus  - Increase heparin to 1200 units/hr - Follow up Heparin level 6 hours after rate change - Daily CBC and heparin level - Monitor for signs and symptoms of bleeding - Follow up anticoagulation plans for surgery  Hassie Bruce, Pharm. D. Clinical Pharmacy Resident Pager: [phone] Ph: [phone] 12/04/2014 8:03 AM

## 2014-12-04 NOTE — Progress Notes (Signed)
[facility]                                                                              Patient Demographics  Stephen [name], is a 74 y.o. male, DOB - [birth year], BJS:[id number]  Admit date - 12/03/2014   Admitting Physician Sid Falcon, MD  Outpatient Primary MD for the patient is Reynold Bowen, NT  LOS - 1   Chief Complaint  Patient presents with  . Leg Swelling      HPI on 12/03/2014 by Dr. Linna Darner Stephen [name] is a 74 y.o. Male.Leg pain: L. Getting worse. Located in L ankle w/o radiation. Open sore and w/ purulent discharge. Open wound on ankle present for 8-9 mo. Associated w/ generalized body aches. Deneis fevers, SOB, palpitations. Hydrocodone w/o benefit in ankle. Pain is worse w/ movement.  CP a couple nights ago that was relieved w/ "CP pill." but pt unsure of what medicine it was he took.  CBG at home range from 400-500 routinely  Assessment & Plan   Peripheral Vascular Disease -patient has had stenting of the bilateral iliac systems for ulcer -Healed ulcers on right lower extremity -Nonhealing open wounds on Left lower ext.   -Vascular surgery consulted and appreciated, patient not a candidate from fempop bypass, will likely need an amputation  -Continue vancomycin and zosyn  Elevated troponin -Patient had chest pain a few days ago, currently chest pain free -Cardiology consulted and appreciated -Continue heparin drip -Continue aspirin and plavix, statin, metoprolol, lisinopril  Sepsis secondary to LLE cellulitis/nonhealing uclers -Treatment and plan as above -Patient continues to have low grade temp, leukocytosis, tachycardia  Chronic systolic and diastolic heart failure -Echocardiogram December 2014 shows EF of 60-73%, grade 1 diastolic dysfunction -Continue home regimen of Lasix, metolazone, spironolactone, metoprolol, lisinopril -Monitor daily weights, intake and output -Echocardiogram pending  Diabetes mellitus, type II -Poorly  controlled -Last hemoglobin A1c in October 2015 9.3 -Continue Lantus, insulin sliding scale, CBG monitoring -Metformin and glipizide held  Hypertension -Continue home regimen: Metoprolol, lisinopril, diuretics  Pancreatic insufficiency -Continue creon  COPD -Currently compensated, no wheezing -Continue Symbicort, Spiriva  Neuropathic pain -Continue Neurontin  Hyperlipidemia -Continue statin  Tobacco abuse -Patient counseled continue nicotine patch  Code Status: DNR  Family Communication: None at bedside  Disposition Plan: Admitted  Time Spent in minutes   30 minutes  Procedures  None  Consults   Cardiology Vascular Surgery  DVT Prophylaxis  Heparin  Lab Results  Component Value Date   PLT 576* 12/04/2014    Medications  Scheduled Meds: . antiseptic oral rinse  7 mL Mouth Rinse BID  . aspirin  325 mg Oral Daily  . atorvastatin  40 mg Oral Daily  . clopidogrel  75 mg Oral Daily  . furosemide  40 mg Oral Daily  . gabapentin  300 mg Oral QHS  . insulin aspart  0-15 Units Subcutaneous TID WC  . isosorbide mononitrate  30 mg Oral Daily  . lipase/protease/amylase  72,000 Units Oral TID WC  . lisinopril  40 mg Oral Daily  . metolazone  2.5 mg Oral Daily  . metoprolol tartrate  25 mg Oral BID  . nicotine  14 mg Transdermal Daily  . pantoprazole  40 mg Oral Daily  . piperacillin-tazobactam (ZOSYN)  IV  3.375 g Intravenous Q8H  . sodium chloride  3 mL Intravenous Q12H  . spironolactone  25 mg Oral Daily  . tiotropium  18 mcg Inhalation Daily  . vancomycin  750 mg Intravenous Q12H   Continuous Infusions: . sodium chloride 75 mL/hr at 12/03/14 2218  . heparin 1,200 Units/hr (12/04/14 0831)   PRN Meds:.acetaminophen **OR** acetaminophen, budesonide-formoterol, levalbuterol, lipase/protease/amylase, morphine injection, ondansetron **OR** ondansetron (ZOFRAN) IV  Antibiotics    Anti-infectives    Start     Dose/Rate Route Frequency Ordered Stop    12/04/14 1200  vancomycin (VANCOCIN) IVPB 750 mg/150 ml premix     750 mg150 mL/hr over 60 Minutes Intravenous Every 12 hours 12/03/14 2216     12/04/14 0400  piperacillin-tazobactam (ZOSYN) IVPB 3.375 g     3.375 g12.5 mL/hr over 240 Minutes Intravenous Every 8 hours 12/03/14 2216     12/03/14 2100  piperacillin-tazobactam (ZOSYN) IVPB 3.375 g     3.375 g100 mL/hr over 30 Minutes Intravenous  Once 12/03/14 2024 12/04/14 0011   12/03/14 2030  vancomycin (VANCOCIN) 1,500 mg in sodium chloride 0.9 % 500 mL IVPB     1,500 mg250 mL/hr over 120 Minutes Intravenous  Once 12/03/14 2024 12/04/14 0200        Subjective:   Stephen [name] seen and examined today.  Denies chest pain or shortness of breath. Continues to complain of leg pain.  Objective:   Filed Vitals:   12/04/14 0120 12/04/14 0433 12/04/14 0543 12/04/14 0700  BP:  154/73    Pulse:  111    Temp: 99.5 F (37.5 C) 99.4 F (37.4 C)  99.2 F (37.3 C)  TempSrc: Oral Oral  Oral  Resp:  20    Height:      Weight:      SpO2:  100% 97%     Wt Readings from Last 3 Encounters:  12/03/14 75.3 kg (166 lb 0.1 oz)  11/03/14 77.111 kg (170 lb)  09/23/14 77.111 kg (170 lb)     Intake/Output Summary (Last 24 hours) at 12/04/14 1108 Last data filed at 12/04/14 0500  Gross per 24 hour  Intake   1263 ml  Output    550 ml  Net    713 ml    Exam  General: Well developed, malnourished appears stated age  89: NCAT, mucous membranes moist.   Cardiovascular: S1 S2 auscultated, no rubs, murmurs or gallops. Regular rate and rhythm.  Respiratory: Clear to auscultation bilaterally with equal chest rise  Abdomen: Soft, nontender, nondistended, + bowel sounds  Extremities: warm dry without cyanosis clubbing; +1 edema in LE   Neuro: AAOx3, however lethargic, able to move all extremities   Skin: LLE: uclerations in various stages, nonhealing ucler on medial malleolus, erythema  Data Review   Micro Results Recent Results (from  the past 240 hour(s))  MRSA PCR Screening     Status: None   Collection Time: 12/03/14  9:31 PM  Result Value Ref Range Status   MRSA by PCR NEGATIVE NEGATIVE Final    Comment:        The GeneXpert MRSA Assay (FDA approved for NASAL specimens only), is one component of a comprehensive MRSA colonization surveillance program. It is not intended to diagnose MRSA infection nor to guide or monitor treatment for MRSA infections.     Radiology Reports Dg Chest 1 View  12/03/2014  CLINICAL DATA:  Bilateral lower extremity swelling.  EXAM: CHEST - 1 VIEW  COMPARISON:  PA and lateral chest 10/05/2014 the hand 10/23/2013.  FINDINGS: Heart size is normal. The patient is status post CABG. Lungs are clear. No pneumothorax or pleural effusion.  IMPRESSION: No acute disease.   Electronically Signed   By: Inge Rise M.D.   On: 12/03/2014 17:47   Dg Tibia/fibula Left  12/03/2014   CLINICAL DATA:  Bilateral lower extremity swelling. Wound over the medial malleolus of the left ankle.  EXAM: LEFT TIBIA AND FIBULA - 2 VIEW  COMPARISON:  None.  FINDINGS: Wound over the medial malleolus is identified. There may be an additional wound posteriorly along the left lower leg. a No acute bony or joint abnormality is seen. Atherosclerosis is noted.  IMPRESSION: Skin wounds without underlying acute bony or joint abnormality.  Atherosclerosis.   Electronically Signed   By: Inge Rise M.D.   On: 12/03/2014 17:49   Dg Ankle Complete Left  12/03/2014   CLINICAL DATA:  Bilateral leg swelling, bilateral ankle swelling, large round necrotic sore overlying medial malleolus on left ankle  EXAM: LEFT ANKLE COMPLETE - 3+ VIEW  COMPARISON:  None.  FINDINGS: No fracture.  Ankle mortise is normally spaced and aligned.  There is a soft tissue defect extending to the surface of the bone overlying the medial malleolus. No bone resorption is seen to suggest osteomyelitis. There is no soft tissue air. No radiopaque foreign  body.  Mild diffuse subcutaneous soft tissue edema is noted of the lower leg and ankle.  IMPRESSION: No fracture.  No evidence of osteomyelitis.   Electronically Signed   By: Lajean Manes M.D.   On: 12/03/2014 17:47   Dg Ankle Complete Right  12/03/2014   CLINICAL DATA:  Bilateral lower extremity swelling and pain. Initial encounter.  EXAM: RIGHT ANKLE - COMPLETE 3+ VIEW  COMPARISON:  None.  FINDINGS: Soft tissue swelling is seen about the right ankle. No underlying acute bony or joint abnormality is identified. No soft tissue gas collection is seen. Atherosclerosis is noted.  IMPRESSION: Soft tissue swelling.  Otherwise negative.   Electronically Signed   By: Inge Rise M.D.   On: 12/03/2014 17:50    CBC  Recent Labs Lab 12/03/14 1710 12/04/14 0418  WBC 15.3* 13.7*  HGB 9.7* 9.0*  HCT 29.5* 28.7*  PLT 548* 576*  MCV 90.5 90.5  MCH 29.8 28.4  MCHC 32.9 31.4  RDW 14.5 14.6  LYMPHSABS 1.5  --   MONOABS 1.1*  --   EOSABS 0.1  --   BASOSABS 0.0  --     Chemistries   Recent Labs Lab 12/03/14 1710 12/04/14 0418  NA 132* 134*  K 4.3 3.8  CL 90* 93*  CO2 32 31  GLUCOSE 250* 160*  BUN 31* 23  CREATININE 1.30 0.99  CALCIUM 8.8 8.8  AST  --  13  ALT  --  9  ALKPHOS  --  79  BILITOT  --  0.4   ------------------------------------------------------------------------------------------------------------------ estimated creatinine clearance is 69.6 mL/min (by C-G formula based on Cr of 0.99). ------------------------------------------------------------------------------------------------------------------ No results for input(s): HGBA1C in the last 72 hours. ------------------------------------------------------------------------------------------------------------------ No results for input(s): CHOL, HDL, LDLCALC, TRIG, CHOLHDL, LDLDIRECT in the last 72  hours. ------------------------------------------------------------------------------------------------------------------ No results for input(s): TSH, T4TOTAL, T3FREE, THYROIDAB in the last 72 hours.  Invalid input(s): FREET3 ------------------------------------------------------------------------------------------------------------------ No results for input(s): VITAMINB12, FOLATE, FERRITIN, TIBC, IRON, RETICCTPCT in the last 72 hours.  Coagulation  profile  Recent Labs Lab 12/03/14 1710  INR 0.97    No results for input(s): DDIMER in the last 72 hours.  Cardiac Enzymes  Recent Labs Lab 12/03/14 1710 12/04/14 0418  TROPONINI 0.09* 0.15*   ------------------------------------------------------------------------------------------------------------------ Invalid input(s): POCBNP    Easton Fetty D.O. on 12/04/2014 at 11:08 AM  Between 7am to 7pm - Pager - [phone]  After 7pm go to [email or web address] - password [email or web address]  And look for the night coverage person covering for me after hours  [facility] Office  [phone]

## 2014-12-04 NOTE — Progress Notes (Signed)
ANTICOAGULATION CONSULT NOTE - Follow Up Consult  Pharmacy Consult for Heparin Indication: CP/ACS  Allergies  Allergen Reactions  . Levaquin [Levofloxacin] Hives    Patient Measurements: Height: 5\' 10"  (177.8 cm) Weight: 166 lb 0.1 oz (75.3 kg) IBW/kg (Calculated) : 73  Vital Signs: Temp: 99.5 F (37.5 C) (01/31 1635) Temp Source: Oral (01/31 1635)  Labs:  Recent Labs  12/03/14 1710 12/04/14 0418 12/04/14 0630 12/04/14 1010 12/04/14 1538  HGB 9.7* 9.0*  --   --   --   HCT 29.5* 28.7*  --   --   --   PLT 548* 576*  --   --   --   LABPROT 13.0  --   --   --   --   INR 0.97  --   --   --   --   HEPARINUNFRC  --   --  <0.10*  --  <0.10*  CREATININE 1.30 0.99  --   --   --   TROPONINI 0.09* 0.15*  --  0.10* 0.08*    Estimated Creatinine Clearance: 69.6 mL/min (by C-G formula based on Cr of 0.99).   Medications:  Scheduled:  . antiseptic oral rinse  7 mL Mouth Rinse BID  . aspirin  325 mg Oral Daily  . atorvastatin  40 mg Oral Daily  . clopidogrel  75 mg Oral Daily  . furosemide  40 mg Oral Daily  . gabapentin  300 mg Oral QHS  . insulin aspart  0-15 Units Subcutaneous TID WC  . isosorbide mononitrate  30 mg Oral Daily  . lipase/protease/amylase  72,000 Units Oral TID WC  . lisinopril  40 mg Oral Daily  . metolazone  2.5 mg Oral Daily  . metoprolol tartrate  25 mg Oral BID  . nicotine  14 mg Transdermal Daily  . pantoprazole  40 mg Oral Daily  . piperacillin-tazobactam (ZOSYN)  IV  3.375 g Intravenous Q8H  . sodium chloride  3 mL Intravenous Q12H  . spironolactone  25 mg Oral Daily  . tiotropium  18 mcg Inhalation Daily  . vancomycin  750 mg Intravenous Q12H   Infusions:  . sodium chloride 75 mL/hr at 12/03/14 2218  . heparin 1,200 Units/hr (12/04/14 0831)    Assessment:  CC: Stephen [name] is an 74 y.o. male admitted on 12/03/2014 presenting with BLE pain and weakness.  PMH: PVD, DM, HTN, Pancreatic insufficiency, peripheral neuropathy, GERD, CAD/MI,  HLD, CHF   Anticoagulation/Heme: Heparin for ACS - HL remains undetectable.  Hgb trend down 9.7 >> 9.0, plt up 576.  No reports of bleeding at this time per RN.    Nephrology: Crcl 65-70 mL/min.  Goal of Therapy:  Heparin level 0.3-0.7 units/ml Monitor platelets by anticoagulation protocol: Yes   Plan: - Heparin 2000 unit bolus  - Increase heparin to 1500 units/hr - Follow up Heparin level 6 hours after rate change - Daily CBC and heparin level - Monitor for signs and symptoms of bleeding - Follow up anticoagulation plans for surgery  Albertina Parr, PharmD., BCPS Clinical Pharmacist Pager [phone]

## 2014-12-04 NOTE — Consult Note (Signed)
CARDIOLOGY CONSULT NOTE   Patient ID: Stephen [name] MRN: [id number], DOB/AGE: Sep 14, 1941   Admit date: 12/03/2014 Date of Consult: 12/04/2014   Primary Physician: Reynold Bowen, NT Primary Cardiologist: None  Pt. Profile   6M CAD and CABG, severe PVD s/p prior percutaneous procedures including bilateral iliac stenting Nov 2015 , tobacco abuse, COPD, HTN, diabetes, LBBB, hyperlipidemia, and chronic non-healing LLE ulceration who presents to the [facility] ED for worsened LLE pain. Cardiology is consulted for a mildly elevated troponin.  Problem List  Past Medical History  Diagnosis Date  . Diabetes mellitus   . Hypertension   . Pancreatitis   . Neuropathy   . Hyperlipemia   . Peripheral vascular disease     a. 10/2013: severely abnormal ABIs, for OP evaluation.  [name] GERD (gastroesophageal reflux disease)   . S/P colonoscopy July 2003    hyperplastic polyp, diverticulosis, anal papilla and internal hemorrhoids r  . S/P endoscopy 2003    Dr. Braulio Bosch: erosive reflux esophagitis , PUD  . PUD (peptic ulcer disease)     diagnosed via EGD by Dr. Braulio Bosch at [place]  . Diverticulosis   . Hemorrhoids   . Coronary artery disease      a. BMS to LAD 1999. b. HSRA to LAD and stent to RCA 2007. c. NSTEMI/CABG x 3 in 04/2010. d. NSTEMI 10/2013 - secondary to demand ischemia in setting of COPD exacerbation with severe underlying CAD - cath with 3/3 patent grafts, for medical therapy.   [name] COPD (chronic obstructive pulmonary disease)     a. Ongoing tobacco abuse.  . Arthritis   . Neutropenia     a. Dx 10/2013 on labs - instructed to f/u PCP.  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   . Ischemic cardiomyopathy     a. previously EF 30% in 2011. b. 10/2013: EF 45-50% by echo.  . Alcoholism     a. Remote alcoholism  . 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    Past Surgical  History  Procedure Laterality Date  . Coronary artery bypass graft  June 2011    X3  . Right inguinal hernia repair    . Cholecystectomy      3-4 years ago  . Appendectomy      age 41  . Cataracts    . Colonoscopy  05/12/2002    Dr. [name]- diverticulosis, anal papilla, internal hemorrhoids, rectal polyps  . [name] dilation  04/16/2012    Schatzki's ring-status post dilation and disruption/ as described above. Grade 1 esophageal varices. Small hiatal hernia.  [name] dilation  04/16/2012    Procedure: [name] DILATION;  Surgeon: Daneil Dolin, MD;  Location: [facility];  Service: Endoscopy;  Laterality: N/A;  . Colonoscopy  08/10/2012    Procedure: COLONOSCOPY;  Surgeon: Daneil Dolin, MD;  Location: [facility];  Service: Endoscopy;  Laterality: N/A;  9:45 needs 30 mins extra /have Glucagon on hand  . Left heart catheterization with coronary/graft angiogram N/A 10/29/2013    Procedure: LEFT HEART CATHETERIZATION WITH [name];  Surgeon: Burnell Blanks, MD;  Location: [facility] CATH LAB;  Service: Cardiovascular;  Laterality: N/A;  . Abdominal aortagram N/A 09/09/2014    Procedure: ABDOMINAL Maxcine Ham;  Surgeon: Elam Dutch, MD;  Location: [facility] CATH LAB;  Service: Cardiovascular;  Laterality: N/A;  . Atherectomy Bilateral  09/23/2014    Procedure: ATHERECTOMY;  Surgeon: Elam Dutch, MD;  Location: [facility] CATH LAB;  Service: Cardiovascular;  Laterality: Bilateral;  iliacs     Allergies  Allergies  Allergen Reactions  . Levaquin [Levofloxacin] Hives    HPI   34M CAD and CABG, severe PVD s/p prior percutaneous procedures including bilateral iliac stenting Nov 2015 , tobacco abuse, COPD, HTN, diabetes, LBBB, hyperlipidemia, and chronic non-healing LLE ulceration who presents to the [facility] ED for worsened LLE pain. Work-up notable for TnI 0.09. Cr 1.3, K 4.3, WBC 15.3 CXR demonstrated no acute disease. He was transferred to [facility] for vascular surgical evaluation. Dr.  Charleston Ropes was consulted and amputation is considered probable at this point. He was started on vanc/zosyn for infected ulcer. Cardiology is consulted for a mildly elevated troponin that was found on routine lab evaluation.  Stephen [name] was very sleepy and difficult to interview. He was unable to state where we were but knew the year and that it was his birthday. He was clearly able to state that he had not had recent chest pain and that his breathing was normal.   Stephen [name] has had multiple episodes of Type II MIs in the setting of various acute illness. He las underwent a cardiac cath on 10/29/2013 and it demonstrated severe native vessel CAD with 3/3 patent bypass grafts (LIMA to LAD, SVG to PDA, SVG to ramus). Echocardiogram on 10/28/13 demonstrated mildly reduced LV systolic function, EF 40-98%, akinesis of the basalinferior myocardium, and grade I diastolic dysfunction.  Inpatient Medications  . antiseptic oral rinse  7 mL Mouth Rinse BID  . aspirin  325 mg Oral Daily  . atorvastatin  40 mg Oral Daily  . clopidogrel  75 mg Oral Daily  . furosemide  40 mg Oral Daily  . gabapentin  300 mg Oral QHS  . insulin aspart  0-15 Units Subcutaneous TID WC  . isosorbide mononitrate  30 mg Oral Daily  . lipase/protease/amylase  72,000 Units Oral TID WC  . lisinopril  40 mg Oral Daily  . metolazone  2.5 mg Oral Daily  . metoprolol tartrate  25 mg Oral BID  . nicotine  14 mg Transdermal Daily  . pantoprazole  40 mg Oral Daily  . piperacillin-tazobactam (ZOSYN)  IV  3.375 g Intravenous Q8H  . sodium chloride  3 mL Intravenous Q12H  . spironolactone  25 mg Oral Daily  . tiotropium  18 mcg Inhalation Daily  . vancomycin  1,500 mg Intravenous Once  . vancomycin  750 mg Intravenous Q12H    Family History Family History  Problem Relation Age of Onset  . Coronary artery disease Mother   . Coronary artery disease Father   . Diabetes Sister   . Suicidality Brother   . Cirrhosis Brother   . Colon  cancer Neg Hx      Social History History   Social History  . Marital Status: Married    Spouse Name: N/A    Number of Children: N/A  . Years of Education: N/A   Occupational History  . Disabled    Social History Main Topics  . Smoking status: Current Every Day Smoker -- 0.75 packs/day for 63 years    Types: Cigarettes  . Smokeless tobacco: Never Used  . Alcohol Use: No     Comment: Quit 1985  . Drug Use: No  . Sexual Activity: Not Currently   Other Topics Concern  . Not on file   Social History Narrative  Review of Systems  Unable to complete full ROS  Physical Exam  Blood pressure 106/50, pulse 84, temperature 99.4 F (37.4 C), temperature source Axillary, resp. rate 14, height 5\' 10"  (1.778 m), weight 75.3 kg (166 lb 0.1 oz), SpO2 97 %.  General: Pleasant, NAD, sleepy, mildly confused Psych: Normal affect. Neuro: Sleepy. Moves all extremities spontaneously. HEENT: Normal  Neck: Supple without bruits or JVD. Lungs:  Resp regular and unlabored. Diffuse inspiratory and expiratory wheezes. Heart: RRR no s3, s4, or murmurs. Abdomen: Soft, non-tender, non-distended, BS + x 4.  Extremities: No clubbing, cyanosis. 1+ LE edema. The LLE has multiple ulcerations in various stages of healing. The largest is a ~5cm in diameter non healing ulcer overlying the medial malleoulus with central eschar with surrounding erythema. No drainage on my exam. DP pulses have been dopplerable. Radials 2+   Labs   Recent Labs  12/03/14 1710  TROPONINI 0.09*   Lab Results  Component Value Date   WBC 15.3* 12/03/2014   HGB 9.7* 12/03/2014   HCT 29.5* 12/03/2014   MCV 90.5 12/03/2014   PLT 548* 12/03/2014    Recent Labs Lab 12/03/14 1710  NA 132*  K 4.3  CL 90*  CO2 32  BUN 31*  CREATININE 1.30  CALCIUM 8.8  GLUCOSE 250*   Lab Results  Component Value Date   CHOL 169 10/28/2013   HDL 53 10/28/2013   LDLCALC 76 10/28/2013   TRIG 202* 10/28/2013   No results  found for: DDIMER  Radiology/Studies  Dg Chest 1 View  12/03/2014   CLINICAL DATA:  Bilateral lower extremity swelling.  EXAM: CHEST - 1 VIEW  COMPARISON:  PA and lateral chest 10/05/2014 the hand 10/23/2013.  FINDINGS: Heart size is normal. The patient is status post CABG. Lungs are clear. No pneumothorax or pleural effusion.  IMPRESSION: No acute disease.   Electronically Signed   By: Inge Rise M.D.   On: 12/03/2014 17:47   Dg Tibia/fibula Left  12/03/2014   CLINICAL DATA:  Bilateral lower extremity swelling. Wound over the medial malleolus of the left ankle.  EXAM: LEFT TIBIA AND FIBULA - 2 VIEW  COMPARISON:  None.  FINDINGS: Wound over the medial malleolus is identified. There may be an additional wound posteriorly along the left lower leg. a No acute bony or joint abnormality is seen. Atherosclerosis is noted.  IMPRESSION: Skin wounds without underlying acute bony or joint abnormality.  Atherosclerosis.   Electronically Signed   By: Inge Rise M.D.   On: 12/03/2014 17:49   Dg Ankle Complete Left  12/03/2014   CLINICAL DATA:  Bilateral leg swelling, bilateral ankle swelling, large round necrotic sore overlying medial malleolus on left ankle  EXAM: LEFT ANKLE COMPLETE - 3+ VIEW  COMPARISON:  None.  FINDINGS: No fracture.  Ankle mortise is normally spaced and aligned.  There is a soft tissue defect extending to the surface of the bone overlying the medial malleolus. No bone resorption is seen to suggest osteomyelitis. There is no soft tissue air. No radiopaque foreign body.  Mild diffuse subcutaneous soft tissue edema is noted of the lower leg and ankle.  IMPRESSION: No fracture.  No evidence of osteomyelitis.   Electronically Signed   By: Lajean Manes M.D.   On: 12/03/2014 17:47   Dg Ankle Complete Right  12/03/2014   CLINICAL DATA:  Bilateral lower extremity swelling and pain. Initial encounter.  EXAM: RIGHT ANKLE - COMPLETE 3+ VIEW  COMPARISON:  None.  FINDINGS: Soft  tissue swelling  is seen about the right ankle. No underlying acute bony or joint abnormality is identified. No soft tissue gas collection is seen. Atherosclerosis is noted.  IMPRESSION: Soft tissue swelling.  Otherwise negative.   Electronically Signed   By: Inge Rise M.D.   On: 12/03/2014 17:50   TTE 10/28/13 Study Conclusions  - Left ventricle: The cavity size was mildly dilated. Systolic function was mildly reduced. The estimated ejection fraction was in the range of 45% to 50%. There is akinesis of the basalinferior myocardium. Doppler parameters are consistent with abnormal left ventricular relaxation (grade 1 diastolic dysfunction). - Mitral valve: Mild regurgitation. - Left atrium: The atrium was mildly dilated.  Cath 10/29/13 Hemodynamic Findings: Central aortic pressure: 198/60 Left ventricular pressure: 199/3/20  Angiographic Findings:  Left main: 99% proximal stenosis. The mid vessel is calcified with diffuse 30% stenosis.   Left Anterior Descending Artery: Large caliber vessel that courses to the apex. The proximal vessel has diffuse 30% stenosis. The mid vessel is stented. There is moderate restenosis in the stents. There is competitive flow from the patent IMA graft. Several diagonal branches are seen to fill from the graft.   Circumflex Artery: Intermediate branch is large in caliber with 50% proximal stenosis, fills from the patent vein graft and antegrade. The AV groove Circumflex is small in caliber with one small caliber obtuse marginal branch, fills from native antegrade flow and retrograde flow from the intermediate branch/vein graft. This Circumflex system is protected by the patent vein graft even though the left main has severe disease.   Right Coronary Artery: Large dominant vessel with 100% proximal stenosis. The distal vessel fills from the patent vein graft. His native vessel has severe disease proximal to the graft insertion.   Graft Anatomy:  SVG to PDA  is patent.  SVG to intermediate is patent LIMA to mid LAD is patent.   Left Ventricular Angiogram: LVEF=40%. Global hypokinesis.   Impression: 1. Severe triple vessel CAD s/p CABG with 3/3 patent bypass grafts.  2. Moderate LV systolic dysfunction 3. NSTEMI secondary to demand ischemia in setting of COPD exacerbation with severe underlying CAD  Recommendations: Continue medical management of CAD. Aggressive risk factor reduction including smoking cessation, BP control. He will need continued treatment for his COPD exacerbation. I will ask Pulmonary to see him today to help with management of his COPD exacerbation.     ECG  NSR, LBBB  ASSESSMENT AND PLAN  4M CAD and CABG, severe PVD s/p prior percutaneous procedures including bilateral iliac stenting Nov 2015 , tobacco abuse, COPD, HTN, diabetes, LBBB, hyperlipidemia, and chronic non-healing LLE ulceration who presents to the [facility] ED for worsened LLE pain. He was transferred to [facility] for vascular surgical intervention. Cardiology is consulted for a mildly elevated troponin.  Based on known history of severe 3VD with multiple instances of Type II MIs, in the setting of having no chest pain, but having an infected ulceration, the mildly elevated TnI is most likely due to demand. It is worth cycling cardiac biomarkers to ascertain the pattern of rise and fall. Although Stephen [name] is high risk for a moderate risk surgical procedure (amputation), the infected ulceration is likely driving the NSTEMI and so the risk/benefit analysis likely favors proceeding with amputation (if recommended) so long as the troponin trends do not suggest a Type I MI.   1. OK to continue heparin for now. If 2nd TnI is stable, please discontinue. 2. Continue CAD meds: ASA, imdur, lisinopril, spironolactone,  clopidogrel, and metoprolol, at home doses. 3. Increase atorvastatin to 80mg .  4. Check TTE  Signed, Lamar Sprinkles, MD 12/04/2014, 1:17 AM

## 2014-12-04 NOTE — Progress Notes (Signed)
Patient transferred from [facility] via [place]. Patient oriented to unit and room, instructed on callbell and placed at side. Belongings at bedside. No family available at this time. Will continue to monitor.

## 2014-12-04 NOTE — Consult Note (Signed)
[facility] NOTE   MRN : [id number]  Reason for Consult: sever PAD with non healing left LE ulcers Referring Physician: ED  History of Present Illness: 74 y/o male  who was last seen  In our office on 11/04/2015.  He is s/p Aortogram with right EIA diamondback athrectomy, right EIA stent, left CIA and EIA athrectomy and left CIA and EIA stent 09/23/14.   Dr. [name] recommendations at that visit:  Patient has cardiomyopathy, severe deconditioning, severe COPD. I have had several discussions with the patient and his sons and do not believe that he is in any condition to tolerate a femoropopliteal bypass currently. He has a patent aortoiliac system at this point. He has easily palpable bilateral femoral pulses. He has healed some ulcers on the left leg. He has no ulcers on the right leg currently. Hopefully the left leg ulcers will continue to heal with conservative measures. Due to his underlying multiple comorbidities and do not believe he will ever be a candidate for a femoropopliteal bypass. The occlusions in his lower extremities are too lengthy and calcified to consider percutaneous options. The patient will follow-up with me in 6 weeks' time to see if he has improved overall.  He came to the ED secondary to increased left leg pain.     His hypercholesterolemia is managed with a statin.  He does take Plavix and an aspirin.  He has been started on IV antibiotics  Current Facility-Administered Medications  Medication Dose Route Frequency Provider Last Rate Last Dose  . 0.45 % sodium chloride infusion   Intravenous Continuous Waldemar Dickens, MD 75 mL/hr at 12/03/14 2218    . acetaminophen (TYLENOL) tablet 650 mg  650 mg Oral Q6H PRN Waldemar Dickens, MD       Or  . acetaminophen (TYLENOL) suppository 650 mg  650 mg Rectal Q6H PRN Waldemar Dickens, MD      . antiseptic oral rinse (CPC / CETYLPYRIDINIUM CHLORIDE 0.05%) solution 7 mL  7 mL Mouth Rinse BID  Sid Falcon, MD      . aspirin tablet 325 mg  325 mg Oral Daily Waldemar Dickens, MD      . atorvastatin (LIPITOR) tablet 40 mg  40 mg Oral Daily Waldemar Dickens, MD      . budesonide-formoterol [facility]) 160-4.5 MCG/ACT inhaler 1-2 puff  1-2 puff Inhalation BID PRN Waldemar Dickens, MD      . clopidogrel (PLAVIX) tablet 75 mg  75 mg Oral Daily Waldemar Dickens, MD      . furosemide (LASIX) tablet 40 mg  40 mg Oral Daily Waldemar Dickens, MD      . gabapentin (NEURONTIN) capsule 300 mg  300 mg Oral QHS Waldemar Dickens, MD   300 mg at 12/03/14 2218  . heparin ADULT infusion 100 units/mL (25000 units/250 mL)  1,200 Units/hr Intravenous Continuous Blossom Hoops, RPH      . heparin bolus via infusion 2,000 Units  2,000 Units Intravenous Once Blossom Hoops, [facility]      . insulin aspart (novoLOG) injection 0-15 Units  0-15 Units Subcutaneous TID WC Waldemar Dickens, MD      . isosorbide mononitrate (IMDUR) 24 hr tablet 30 mg  30 mg Oral Daily Waldemar Dickens, MD      . levalbuterol [facility]) nebulizer solution 0.63 mg  0.63 mg Nebulization Q4H PRN Waldemar Dickens, MD   0.63 mg at 12/04/14 0541  .  lipase/protease/amylase (CREON) capsule 48,000 Units  48,000 Units Oral PRN Jaquita Folds, RPH      . lipase/protease/amylase (CREON) capsule 72,000 Units  72,000 Units Oral TID WC Kendra P Hiatt, RPH   72,000 Units at 12/04/14 0800  . lisinopril (PRINIVIL,ZESTRIL) tablet 40 mg  40 mg Oral Daily Waldemar Dickens, MD      . metolazone (ZAROXOLYN) tablet 2.5 mg  2.5 mg Oral Daily Waldemar Dickens, MD      . metoprolol tartrate (LOPRESSOR) tablet 25 mg  25 mg Oral BID Waldemar Dickens, MD   25 mg at 12/03/14 2218  . morphine 4 MG/ML injection 4 mg  4 mg Intravenous Q4H PRN Waldemar Dickens, MD   4 mg at 12/04/14 0534  . nicotine (NICODERM CQ - dosed in mg/24 hours) patch 14 mg  14 mg Transdermal Daily Waldemar Dickens, MD   14 mg at 12/03/14 2000  . ondansetron (ZOFRAN) tablet 4 mg  4 mg Oral Q6H PRN Waldemar Dickens, MD        Or  . ondansetron [facility]) injection 4 mg  4 mg Intravenous Q6H PRN Waldemar Dickens, MD      . pantoprazole (PROTONIX) EC tablet 40 mg  40 mg Oral Daily Waldemar Dickens, MD   40 mg at 12/03/14 2218  . piperacillin-tazobactam (ZOSYN) IVPB 3.375 g  3.375 g Intravenous Q8H Rogue Bussing, RPH   3.375 g at 12/04/14 0442  . sodium chloride 0.9 % injection 3 mL  3 mL Intravenous Q12H Waldemar Dickens, MD   3 mL at 12/03/14 2220  . spironolactone (ALDACTONE) tablet 25 mg  25 mg Oral Daily Waldemar Dickens, MD      . tiotropium [facility]) inhalation capsule 18 mcg  18 mcg Inhalation Daily Waldemar Dickens, MD      . vancomycin (VANCOCIN) IVPB 750 mg/150 ml premix  750 mg Intravenous Q12H Rogue Bussing, [facility]        Pt meds include: Statin :Yes Betablocker: Yes ASA: Yes Other anticoagulants/antiplatelets: None  Past Medical History  Diagnosis Date  . Diabetes mellitus   . Hypertension   . Pancreatitis   . Neuropathy   . Hyperlipemia   . Peripheral vascular disease     a. 10/2013: severely abnormal ABIs, for OP evaluation.  [name] GERD (gastroesophageal reflux disease)   . S/P colonoscopy July 2003    hyperplastic polyp, diverticulosis, anal papilla and internal hemorrhoids r  . S/P endoscopy 2003    Dr. Braulio Bosch: erosive reflux esophagitis , PUD  . PUD (peptic ulcer disease)     diagnosed via EGD by Dr. Braulio Bosch at [place]  . Diverticulosis   . Hemorrhoids   . Coronary artery disease      a. BMS to LAD 1999. b. HSRA to LAD and stent to RCA 2007. c. NSTEMI/CABG x 3 in 04/2010. d. NSTEMI 10/2013 - secondary to demand ischemia in setting of COPD exacerbation with severe underlying CAD - cath with 3/3 patent grafts, for medical therapy.   [name] COPD (chronic obstructive pulmonary disease)     a. Ongoing tobacco abuse.  . Arthritis   . Neutropenia     a. Dx 10/2013 on labs - instructed to f/u PCP.  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   . Ischemic cardiomyopathy     a. previously EF 30% in 2011. b. 10/2013: EF  45-50% by echo.  . Alcoholism     a. Remote alcoholism  . 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    Past Surgical History  Procedure Laterality Date  . Coronary artery bypass graft  June 2011    X3  . Right inguinal hernia repair    . Cholecystectomy      3-4 years ago  . Appendectomy      age 8  . Cataracts    . Colonoscopy  05/12/2002    Dr. [name]- diverticulosis, anal papilla, internal hemorrhoids, rectal polyps  . [name] dilation  04/16/2012    Schatzki's ring-status post dilation and disruption/ as described above. Grade 1 esophageal varices. Small hiatal hernia.  [name] dilation  04/16/2012    Procedure: [name] DILATION;  Surgeon: Daneil Dolin, MD;  Location: [facility] ENDO SUITE;  Service: Endoscopy;  Laterality: N/A;  . Colonoscopy  08/10/2012    Procedure: COLONOSCOPY;  Surgeon: Daneil Dolin, MD;  Location: [facility] ENDO SUITE;  Service: Endoscopy;  Laterality: N/A;  9:45 needs 30 mins extra /have Glucagon on hand  . Left heart catheterization with coronary/graft angiogram N/A 10/29/2013    Procedure: LEFT HEART CATHETERIZATION WITH [name];  Surgeon: Burnell Blanks, MD;  Location: [facility] CATH LAB;  Service: Cardiovascular;  Laterality: N/A;  . Abdominal aortagram N/A 09/09/2014    Procedure: ABDOMINAL Maxcine Ham;  Surgeon: Elam Dutch, MD;  Location: [facility] CATH LAB;  Service: Cardiovascular;  Laterality: N/A;  . Atherectomy Bilateral 09/23/2014    Procedure: ATHERECTOMY;  Surgeon: Elam Dutch, MD;  Location: [facility] CATH LAB;  Service: Cardiovascular;  Laterality: Bilateral;  iliacs    Social History History  Substance Use Topics  . Smoking status: Current Every Day Smoker -- 0.75 packs/day for 63 years    Types: Cigarettes  . Smokeless tobacco: Never Used  . Alcohol Use: No     Comment: Quit 1985     Family History Family History  Problem Relation Age of Onset  . Coronary artery disease Mother   . Coronary artery disease Father   . Diabetes Sister   . Suicidality Brother   . Cirrhosis Brother   . Colon cancer Neg Hx     Allergies  Allergen Reactions  . Levaquin [Levofloxacin] Hives     REVIEW OF SYSTEMS  General: [ ]  Weight loss, [ ]  Fever, [ ]  chills Neurologic: [ ]  Dizziness, [ ]  Blackouts, [ ]  Seizure [ ]  Stroke, [ ]  "Mini stroke", [ ]  Slurred speech, [ ]  Temporary blindness; [ ]  weakness in arms or legs, [ ]  Hoarseness [ ]  Dysphagia Cardiac: [ ]  Chest pain/pressure, [ ]  Shortness of breath at rest [ ]  Shortness of breath with exertion, [ ]  Atrial fibrillation or irregular heartbeat  Vascular: [ ]  Pain in legs with walking, [ ]  Pain in legs at rest, [ ]  Pain in legs at night,  [ ]  Non-healing ulcer, [ ]  Blood clot in vein/DVT,   Pulmonary: [ ]  Home oxygen, [ ]  Productive cough, [ ]  Coughing up blood, [ ]  Asthma,  [ ]  Wheezing [ ]  COPD Musculoskeletal:  [ ]  Arthritis, [ ]  Low back pain, [ ]  Joint pain Hematologic: [ ]  Easy Bruising, [ ]  Anemia; [ ]  Hepatitis Gastrointestinal: [ ]  Blood in stool, [ ]  Gastroesophageal Reflux/heartburn, Urinary: [ ]  chronic Kidney disease, [ ]  on HD - [ ]  MWF or [ ]  TTHS, [ ]  Burning with  urination, [ ]  Difficulty urinating Skin: [ ]  Rashes, [ ]  Wounds Psychological: [ ]  Anxiety, [ ]  Depression  Physical Examination Filed Vitals:   12/04/14 0100 12/04/14 0120 12/04/14 0433 12/04/14 0543  BP: 106/50  154/73   Pulse: 84  111   Temp: 99.4 F (37.4 C) 99.5 F (37.5 C) 99.4 F (37.4 C)   TempSrc: Axillary Oral Oral   Resp: 14  20   Height:      Weight:      SpO2: 97%  100% 97%   Body mass index is 23.82 kg/(m^2).  General:  WDWN in NAD HENT: WNL Eyes: Pupils equal Pulmonary: normal non-labored breathing , without Rales, rhonchi,  wheezing Cardiac: RRR, without  Murmurs, rubs or gallops; No carotid bruits Abdomen: soft, NT,  no masses Skin: no rashes, ulcers noted;  no Gangrene , no cellulitis; no open wounds;   Vascular Exam/Pulses:Palpable femoral pulses bilaterally.   Musculoskeletal: left LE multiple wounds worse being medial malleolus with erythema-cellulitis. Neurologic: A&O X 3; Appropriate Affect ;  SENSATION: normal; MOTOR FUNCTION: 5/5 Symmetric Speech is fluent/normal   Significant Diagnostic Studies: CBC Lab Results  Component Value Date   WBC 13.7* 12/04/2014   HGB 9.0* 12/04/2014   HCT 28.7* 12/04/2014   MCV 90.5 12/04/2014   PLT 576* 12/04/2014    BMET    Component Value Date/Time   NA 134* 12/04/2014 0418   K 3.8 12/04/2014 0418   CL 93* 12/04/2014 0418   CO2 31 12/04/2014 0418   GLUCOSE 160* 12/04/2014 0418   BUN 23 12/04/2014 0418   CREATININE 0.99 12/04/2014 0418   CALCIUM 8.8 12/04/2014 0418   GFRNONAA 80* 12/04/2014 0418   GFRAA >90 12/04/2014 0418   Estimated Creatinine Clearance: 69.6 mL/min (by C-G formula based on Cr of 0.99).  COAG Lab Results  Component Value Date   INR 0.97 12/03/2014   INR 1.04 10/29/2013   INR 1.22 04/23/2010     Non-Invasive Vascular Imaging:  ABI's 11/03/14: Right 0.55 Left: Not done due to ulcers on leg   ASSESSMENT/PLAN:  Sever PAD with non healing left LE ulcers Due to his underlying multiple comorbidities and do not believe he will ever be a candidate for a femoropopliteal bypass. The occlusions in his lower extremities are too lengthy and calcified to consider percutaneous options.  We will have to consider left LE amputation. I will discuss a plan with Dr. Fae Pippin, [facility] 12/04/2014 8:20 AM  I agree with the above.  This is a patient of Dr. [name].  He has undergone atherectomy and stenting of bilateral iliac systems for bilateral ulcers.  The ulcers on his right leg have healed.  He continues to have palpable femoral pulses bilaterally.  He appears to have progression of the wound on his left medial  malleolus with purulent drainage.  Previously, the patient has been deemed a very poor operative candidate for surgical bypass.  Continue with IV antibiotics.  Dr. [name] will evaluate tomorrow.  The patient is at high risk for requiring amputation.  Annamarie Major

## 2014-12-04 NOTE — Progress Notes (Signed)
Found patient sitting on floor next to bed. Returned patient back to bed. Patient confused, reoriented patient. VSS. No signs of injury. Fredirick Maudlin, NP notified. Bed alarm on. Will continue to monitor.

## 2014-12-04 NOTE — Progress Notes (Signed)
Subjective: No CP.    Objective: Vital signs in last 24 hours: Temp:  [97.7 F (36.5 C)-99.5 F (37.5 C)] 99.2 F (37.3 C) (01/31 0700) Pulse Rate:  [84-111] 111 (01/31 0433) Resp:  [14-20] 20 (01/31 0433) BP: (106-168)/(50-110) 154/73 mmHg (01/31 0433) SpO2:  [91 %-100 %] 97 % (01/31 0543) Weight:  [166 lb 0.1 oz (75.3 kg)] 166 lb 0.1 oz (75.3 kg) (01/30 2120) Last BM Date: 12/02/14  Intake/Output from previous day: 01/30 0701 - 01/31 0700 In: 1263 [I.V.:663; IV Piggyback:600] Out: 550 [Urine:550] Intake/Output this shift:    Medications Current Facility-Administered Medications  Medication Dose Route Frequency Provider Last Rate Last Dose  . 0.45 % sodium chloride infusion   Intravenous Continuous Waldemar Dickens, MD 75 mL/hr at 12/03/14 2218    . acetaminophen (TYLENOL) tablet 650 mg  650 mg Oral Q6H PRN Waldemar Dickens, MD       Or  . acetaminophen (TYLENOL) suppository 650 mg  650 mg Rectal Q6H PRN Waldemar Dickens, MD      . antiseptic oral rinse (CPC / CETYLPYRIDINIUM CHLORIDE 0.05%) solution 7 mL  7 mL Mouth Rinse BID Sid Falcon, MD   7 mL at 12/04/14 1049  . aspirin tablet 325 mg  325 mg Oral Daily Waldemar Dickens, MD   325 mg at 12/04/14 1049  . atorvastatin (LIPITOR) tablet 40 mg  40 mg Oral Daily Waldemar Dickens, MD      . budesonide-formoterol [facility]) 160-4.5 MCG/ACT inhaler 1-2 puff  1-2 puff Inhalation BID PRN Waldemar Dickens, MD      . clopidogrel (PLAVIX) tablet 75 mg  75 mg Oral Daily Waldemar Dickens, MD   75 mg at 12/04/14 1049  . furosemide (LASIX) tablet 40 mg  40 mg Oral Daily Waldemar Dickens, MD   40 mg at 12/04/14 1049  . gabapentin (NEURONTIN) capsule 300 mg  300 mg Oral QHS Waldemar Dickens, MD   300 mg at 12/03/14 2218  . heparin ADULT infusion 100 units/mL (25000 units/250 mL)  1,200 Units/hr Intravenous Continuous Blossom Hoops, RPH 12 mL/hr at 12/04/14 0831 1,200 Units/hr at 12/04/14 0831  . insulin aspart (novoLOG) injection 0-15 Units   0-15 Units Subcutaneous TID WC Waldemar Dickens, MD   3 Units at 12/04/14 [phone]  . isosorbide mononitrate (IMDUR) 24 hr tablet 30 mg  30 mg Oral Daily Waldemar Dickens, MD      . levalbuterol [facility]) nebulizer solution 0.63 mg  0.63 mg Nebulization Q4H PRN Waldemar Dickens, MD   0.63 mg at 12/04/14 0541  . lipase/protease/amylase (CREON) capsule 48,000 Units  48,000 Units Oral PRN Jaquita Folds, RPH      . lipase/protease/amylase (CREON) capsule 72,000 Units  72,000 Units Oral TID WC Kendra P Hiatt, RPH   72,000 Units at 12/04/14 0800  . lisinopril (PRINIVIL,ZESTRIL) tablet 40 mg  40 mg Oral Daily Waldemar Dickens, MD   40 mg at 12/04/14 1049  . metolazone (ZAROXOLYN) tablet 2.5 mg  2.5 mg Oral Daily Waldemar Dickens, MD      . metoprolol tartrate (LOPRESSOR) tablet 25 mg  25 mg Oral BID Waldemar Dickens, MD   25 mg at 12/04/14 1049  . morphine 4 MG/ML injection 4 mg  4 mg Intravenous Q4H PRN Waldemar Dickens, MD   4 mg at 12/04/14 0534  . nicotine (NICODERM CQ - dosed in mg/24 hours) patch 14 mg  14 mg Transdermal Daily Waldemar Dickens, MD   14 mg at 12/04/14 1050  . ondansetron (ZOFRAN) tablet 4 mg  4 mg Oral Q6H PRN Waldemar Dickens, MD       Or  . ondansetron [facility]) injection 4 mg  4 mg Intravenous Q6H PRN Waldemar Dickens, MD      . pantoprazole (PROTONIX) EC tablet 40 mg  40 mg Oral Daily Waldemar Dickens, MD   40 mg at 12/04/14 1049  . piperacillin-tazobactam (ZOSYN) IVPB 3.375 g  3.375 g Intravenous Q8H Rogue Bussing, RPH   3.375 g at 12/04/14 0442  . sodium chloride 0.9 % injection 3 mL  3 mL Intravenous Q12H Waldemar Dickens, MD   3 mL at 12/03/14 2220  . spironolactone (ALDACTONE) tablet 25 mg  25 mg Oral Daily Waldemar Dickens, MD      . tiotropium [facility]) inhalation capsule 18 mcg  18 mcg Inhalation Daily Waldemar Dickens, MD   18 mcg at 12/04/14 [phone]  . vancomycin (VANCOCIN) IVPB 750 mg/150 ml premix  750 mg Intravenous Q12H Rogue Bussing, [facility] [address]        PE: General appearance:  alert, cooperative and no distress Lungs: CTA Heart: regular rate and rhythm, S1, S2 normal, no murmur, click, rub or gallop Extremities: Trace LEE Pulses: 1+ radials.   Neurologic: grossly normal  Lab Results:   Recent Labs  12/03/14 1710 12/04/14 0418  WBC 15.3* 13.7*  HGB 9.7* 9.0*  HCT 29.5* 28.7*  PLT 548* 576*   BMET  Recent Labs  12/03/14 1710 12/04/14 0418  NA 132* 134*  K 4.3 3.8  CL 90* 93*  CO2 32 31  GLUCOSE 250* 160*  BUN 31* 23  CREATININE 1.30 0.99  CALCIUM 8.8 8.8   PT/INR  Recent Labs  12/03/14 1710  LABPROT 13.0  INR 0.97   Cardiac Panel (last 3 results)  Recent Labs  12/03/14 1710 12/04/14 0418 12/04/14 1010  TROPONINI 0.09* 0.15* 0.10*    Assessment/Plan  47M CAD and CABG, severe PVD s/p prior percutaneous procedures including bilateral iliac stenting Nov 2015 , tobacco abuse, COPD, HTN, diabetes, LBBB, hyperlipidemia, and chronic non-healing LLE ulceration.  Cardiac cath on 10/29/2013 and it demonstrated severe native vessel CAD with 3/3 patent bypass grafts (LIMA to LAD, SVG to PDA, SVG to ramus.  He presented to the [facility] ED for worsened LLE pain. Cardiology consulted for a mildly elevated troponin.   Principal Problem:   Ulcer of left lower extremity Active Problems:   CORONARY ARTERY BYPASS GRAFT, HX OF   Hyperlipemia   PVD (peripheral vascular disease)   Essential hypertension   COPD (chronic obstructive pulmonary disease)   Gangrene   Pancreatic insufficiency  Troponin peaked at 0.15 and last was 0.10.  He denies CP.  Not likely having ischemic event.  Continue Imdur 30, lopressor 25bid, plavix, lasix 40, lisinopril 40, aldactone 25, metolazone 2.5mg . Echo pending.  If no changes from prior, DC heparin.    LOS: 1 day    HAGER, BRYAN PA-C 12/04/2014 11:41 AM  I have seen and examined the patient along with HAGER, BRYAN PA-C.  I have reviewed the chart, notes and new data.  I agree with PA's note.  Marginal elevation in  troponin is not consistent with acute coronary syndrome, although it does portend poor overall cardiovascular prognosis. No plan for repeat coronary evaluation at this time, continue medical management.   Sanda Klein, MD, [facility] [phone] 12/04/2014, 12:23 PM

## 2014-12-05 ENCOUNTER — Inpatient Hospital Stay (HOSPITAL_COMMUNITY): Payer: Medicare Other | Admitting: Anesthesiology

## 2014-12-05 ENCOUNTER — Encounter (HOSPITAL_COMMUNITY): Payer: Self-pay | Admitting: Anesthesiology

## 2014-12-05 ENCOUNTER — Encounter (HOSPITAL_COMMUNITY)
Admission: EM | Disposition: A | Discharge status: Skilled Nursing Facility | Payer: Self-pay | Source: Home / Self Care | Attending: Internal Medicine

## 2014-12-05 DIAGNOSIS — I255 Ischemic cardiomyopathy: Secondary | ICD-10-CM | POA: Insufficient documentation

## 2014-12-05 DIAGNOSIS — I509 Heart failure, unspecified: Secondary | ICD-10-CM

## 2014-12-05 DIAGNOSIS — I96 Gangrene, not elsewhere classified: Secondary | ICD-10-CM

## 2014-12-05 HISTORY — PX: AMPUTATION: SHX166

## 2014-12-05 LAB — CBC
HCT: 24.8 % — ABNORMAL LOW (ref 39.0–52.0)
Hemoglobin: 7.7 g/dL — ABNORMAL LOW (ref 13.0–17.0)
MCH: 28 pg (ref 26.0–34.0)
MCHC: 31 g/dL (ref 30.0–36.0)
MCV: 90.2 fL (ref 78.0–100.0)
Platelets: 537 10*3/uL — ABNORMAL HIGH (ref 150–400)
RBC: 2.75 MIL/uL — AB (ref 4.22–5.81)
RDW: 14.9 % (ref 11.5–15.5)
WBC: 15.2 10*3/uL — ABNORMAL HIGH (ref 4.0–10.5)

## 2014-12-05 LAB — BASIC METABOLIC PANEL
ANION GAP: 10 (ref 5–15)
BUN: 22 mg/dL (ref 6–23)
CO2: 31 mmol/L (ref 19–32)
Calcium: 8.8 mg/dL (ref 8.4–10.5)
Chloride: 90 mmol/L — ABNORMAL LOW (ref 96–112)
Creatinine, Ser: 1.06 mg/dL (ref 0.50–1.35)
GFR calc non Af Amer: 68 mL/min — ABNORMAL LOW (ref >90)
GFR, EST AFRICAN AMERICAN: 79 mL/min — AB (ref >90)
Glucose, Bld: 178 mg/dL — ABNORMAL HIGH (ref 70–99)
Potassium: 3.7 mmol/L (ref 3.5–5.1)
Sodium: 131 mmol/L — ABNORMAL LOW (ref 135–145)

## 2014-12-05 LAB — GLUCOSE, CAPILLARY
Glucose-Capillary: 167 mg/dL — ABNORMAL HIGH (ref 70–99)
Glucose-Capillary: 134 mg/dL — ABNORMAL HIGH (ref 70–99)
Glucose-Capillary: 149 mg/dL — ABNORMAL HIGH (ref 70–99)
Glucose-Capillary: 162 mg/dL — ABNORMAL HIGH (ref 70–99)
Glucose-Capillary: 135 mg/dL — ABNORMAL HIGH (ref 70–99)

## 2014-12-05 LAB — HEMOGLOBIN A1C
Hgb A1c MFr Bld: 6.6 % — ABNORMAL HIGH (ref 4.8–5.6)
Mean Plasma Glucose: 143 mg/dL

## 2014-12-05 LAB — PREPARE RBC (CROSSMATCH)

## 2014-12-05 LAB — HEPARIN LEVEL (UNFRACTIONATED)
HEPARIN UNFRACTIONATED: 0.22 [IU]/mL — AB (ref 0.30–0.70)
Heparin Unfractionated: 0.33 IU/mL (ref 0.30–0.70)

## 2014-12-05 SURGERY — AMPUTATION, ABOVE KNEE
Anesthesia: General | Site: Leg Upper | Laterality: Left

## 2014-12-05 MED ORDER — LACTATED RINGERS IV SOLN
INTRAVENOUS | Status: DC | PRN
  Administered 2014-12-05: 12:00:00 via INTRAVENOUS

## 2014-12-05 MED ORDER — FENTANYL CITRATE 0.05 MG/ML IJ SOLN
INTRAMUSCULAR | Status: DC | PRN
  Administered 2014-12-05: 100 ug via INTRAVENOUS
  Administered 2014-12-05: 50 ug via INTRAVENOUS

## 2014-12-05 MED ORDER — PROPOFOL 10 MG/ML IV BOLUS
INTRAVENOUS | Status: AC
  Filled 2014-12-05: qty 20

## 2014-12-05 MED ORDER — LORAZEPAM 2 MG/ML IJ SOLN
1.0000 mg | Freq: Once | INTRAMUSCULAR | Status: AC
  Administered 2014-12-06: 1 mg via INTRAVENOUS
  Filled 2014-12-05: qty 1

## 2014-12-05 MED ORDER — MIDAZOLAM HCL 5 MG/5ML IJ SOLN
INTRAMUSCULAR | Status: DC | PRN
  Administered 2014-12-05: 1 mg via INTRAVENOUS

## 2014-12-05 MED ORDER — PHENYLEPHRINE 40 MCG/ML (10ML) SYRINGE FOR IV PUSH (FOR BLOOD PRESSURE SUPPORT)
PREFILLED_SYRINGE | INTRAVENOUS | Status: AC
  Filled 2014-12-05: qty 10

## 2014-12-05 MED ORDER — LIDOCAINE HCL (CARDIAC) 20 MG/ML IV SOLN
INTRAVENOUS | Status: AC
  Filled 2014-12-05: qty 5

## 2014-12-05 MED ORDER — LIDOCAINE HCL (CARDIAC) 20 MG/ML IV SOLN
INTRAVENOUS | Status: DC | PRN
  Administered 2014-12-05: 50 mg via INTRAVENOUS

## 2014-12-05 MED ORDER — PHENYLEPHRINE HCL 10 MG/ML IJ SOLN
INTRAMUSCULAR | Status: DC | PRN
  Administered 2014-12-05: 40 ug via INTRAVENOUS

## 2014-12-05 MED ORDER — PROMETHAZINE HCL 25 MG/ML IJ SOLN: 6.2500 mg | INTRAMUSCULAR | Status: DC | PRN

## 2014-12-05 MED ORDER — EPHEDRINE SULFATE 50 MG/ML IJ SOLN
INTRAMUSCULAR | Status: DC | PRN
  Administered 2014-12-05 (×2): 5 mg via INTRAVENOUS

## 2014-12-05 MED ORDER — HYDROMORPHONE HCL 1 MG/ML IJ SOLN: 0.2500 mg | INTRAMUSCULAR | Status: DC | PRN

## 2014-12-05 MED ORDER — MORPHINE SULFATE 2 MG/ML IJ SOLN
2.0000 mg | INTRAMUSCULAR | Status: DC | PRN
  Administered 2014-12-05 – 2014-12-08 (×14): 2 mg via INTRAVENOUS
  Filled 2014-12-05 (×14): qty 1

## 2014-12-05 MED ORDER — SODIUM CHLORIDE 0.9 % IJ SOLN
INTRAMUSCULAR | Status: AC
  Filled 2014-12-05: qty 10

## 2014-12-05 MED ORDER — ARTIFICIAL TEARS OP OINT
TOPICAL_OINTMENT | OPHTHALMIC | Status: AC
  Filled 2014-12-05: qty 3.5

## 2014-12-05 MED ORDER — HEPARIN (PORCINE) IN NACL 100-0.45 UNIT/ML-% IJ SOLN
1700.0000 [IU]/h | INTRAMUSCULAR | Status: DC
  Administered 2014-12-06: 1700 [IU]/h via INTRAVENOUS
  Filled 2014-12-05 (×2): qty 250

## 2014-12-05 MED ORDER — MEPERIDINE HCL 25 MG/ML IJ SOLN: 6.2500 mg | INTRAMUSCULAR | Status: DC | PRN

## 2014-12-05 MED ORDER — OXYCODONE HCL 5 MG PO TABS: 5.0000 mg | ORAL_TABLET | Freq: Once | ORAL | Status: DC | PRN

## 2014-12-05 MED ORDER — SUCCINYLCHOLINE CHLORIDE 20 MG/ML IJ SOLN
INTRAMUSCULAR | Status: DC | PRN
  Administered 2014-12-05: 80 mg via INTRAVENOUS

## 2014-12-05 MED ORDER — ONDANSETRON HCL 4 MG/2ML IJ SOLN
INTRAMUSCULAR | Status: AC
  Filled 2014-12-05: qty 2

## 2014-12-05 MED ORDER — 0.9 % SODIUM CHLORIDE (POUR BTL) OPTIME
TOPICAL | Status: DC | PRN
  Administered 2014-12-05: 1000 mL

## 2014-12-05 MED ORDER — EPHEDRINE SULFATE 50 MG/ML IJ SOLN
INTRAMUSCULAR | Status: AC
  Filled 2014-12-05: qty 1

## 2014-12-05 MED ORDER — ARTIFICIAL TEARS OP OINT
TOPICAL_OINTMENT | OPHTHALMIC | Status: DC | PRN
  Administered 2014-12-05: 1 via OPHTHALMIC

## 2014-12-05 MED ORDER — DOCUSATE SODIUM 100 MG PO CAPS
100.0000 mg | ORAL_CAPSULE | Freq: Every day | ORAL | Status: DC
  Administered 2014-12-06 – 2014-12-09 (×2): 100 mg via ORAL
  Filled 2014-12-05 (×2): qty 1

## 2014-12-05 MED ORDER — PROPOFOL 10 MG/ML IV BOLUS
INTRAVENOUS | Status: DC | PRN
  Administered 2014-12-05: 70 mg via INTRAVENOUS

## 2014-12-05 MED ORDER — MIDAZOLAM HCL 2 MG/2ML IJ SOLN
INTRAMUSCULAR | Status: AC
  Filled 2014-12-05: qty 2

## 2014-12-05 MED ORDER — SUCCINYLCHOLINE CHLORIDE 20 MG/ML IJ SOLN
INTRAMUSCULAR | Status: AC
  Filled 2014-12-05: qty 1

## 2014-12-05 MED ORDER — ONDANSETRON HCL 4 MG/2ML IJ SOLN
INTRAMUSCULAR | Status: DC | PRN
  Administered 2014-12-05: 4 mg via INTRAVENOUS

## 2014-12-05 MED ORDER — FENTANYL CITRATE 0.05 MG/ML IJ SOLN
INTRAMUSCULAR | Status: AC
  Filled 2014-12-05: qty 5

## 2014-12-05 MED ORDER — OXYCODONE HCL 5 MG/5ML PO SOLN: 5.0000 mg | Freq: Once | ORAL | Status: DC | PRN

## 2014-12-05 SURGICAL SUPPLY — 43 items
BANDAGE ELASTIC 4 VELCRO ST LF (GAUZE/BANDAGES/DRESSINGS) ×1 IMPLANT
BANDAGE ELASTIC 6 VELCRO ST LF (GAUZE/BANDAGES/DRESSINGS) ×2 IMPLANT
BLADE SAW RECIP 87.9 MT (BLADE) ×2 IMPLANT
BNDG COHESIVE 4X5 TAN STRL (GAUZE/BANDAGES/DRESSINGS) ×2 IMPLANT
BNDG COHESIVE 6X5 TAN STRL LF (GAUZE/BANDAGES/DRESSINGS) ×2 IMPLANT
BNDG GAUZE ELAST 4 BULKY (GAUZE/BANDAGES/DRESSINGS) ×2 IMPLANT
CANISTER SUCTION 2500CC (MISCELLANEOUS) ×2 IMPLANT
CLIP TI MEDIUM 6 (CLIP) IMPLANT
COVER SURGICAL LIGHT HANDLE (MISCELLANEOUS) ×2 IMPLANT
COVER TABLE BACK 60X90 (DRAPES) ×2 IMPLANT
DRAIN CHANNEL 19F RND (DRAIN) IMPLANT
DRAPE ORTHO SPLIT 77X108 STRL (DRAPES) ×4
DRAPE PROXIMA HALF (DRAPES) ×2 IMPLANT
DRAPE SURG ORHT 6 SPLT 77X108 (DRAPES) ×2 IMPLANT
DRSG ADAPTIC 3X8 NADH LF (GAUZE/BANDAGES/DRESSINGS) ×2 IMPLANT
ELECT REM PT RETURN 9FT ADLT (ELECTROSURGICAL) ×2
ELECTRODE REM PT RTRN 9FT ADLT (ELECTROSURGICAL) ×1 IMPLANT
EVACUATOR SILICONE 100CC (DRAIN) IMPLANT
GAUZE SPONGE 4X4 12PLY STRL (GAUZE/BANDAGES/DRESSINGS) ×2 IMPLANT
GLOVE BIO SURGEON STRL SZ7.5 (GLOVE) ×2 IMPLANT
GLOVE BIOGEL PI IND STRL 6.5 (GLOVE) IMPLANT
GLOVE BIOGEL PI INDICATOR 6.5 (GLOVE) ×1
GLOVE ECLIPSE 6.5 STRL STRAW (GLOVE) ×1 IMPLANT
GLOVE SURG SS PI 7.0 STRL IVOR (GLOVE) ×2 IMPLANT
GOWN STRL REUS W/ TWL LRG LVL3 (GOWN DISPOSABLE) ×3 IMPLANT
GOWN STRL REUS W/TWL LRG LVL3 (GOWN DISPOSABLE) ×8
KIT BASIN OR (CUSTOM PROCEDURE TRAY) ×2 IMPLANT
KIT ROOM TURNOVER OR (KITS) ×2 IMPLANT
NS IRRIG 1000ML POUR BTL (IV SOLUTION) ×2 IMPLANT
PACK GENERAL/GYN (CUSTOM PROCEDURE TRAY) ×2 IMPLANT
PAD ARMBOARD 7.5X6 YLW CONV (MISCELLANEOUS) ×4 IMPLANT
SPONGE GAUZE 4X4 12PLY STER LF (GAUZE/BANDAGES/DRESSINGS) ×1 IMPLANT
STAPLER VISISTAT 35W (STAPLE) ×2 IMPLANT
STOCKINETTE IMPERVIOUS LG (DRAPES) ×2 IMPLANT
SUT ETHILON 3 0 PS 1 (SUTURE) IMPLANT
SUT SILK 2 0 SH (SUTURE) ×1 IMPLANT
SUT SILK 2 0 TIES 10X30 (SUTURE) ×2 IMPLANT
SUT VIC AB 2-0 CT1 18 (SUTURE) ×4 IMPLANT
SUT VIC AB 2-0 SH 18 (SUTURE) ×4 IMPLANT
SUT VIC AB 3-0 SH 27 (SUTURE) ×4
SUT VIC AB 3-0 SH 27X BRD (SUTURE) ×2 IMPLANT
UNDERPAD 30X30 INCONTINENT (UNDERPADS AND DIAPERS) ×2 IMPLANT
WATER STERILE IRR 1000ML POUR (IV SOLUTION) ×2 IMPLANT

## 2014-12-05 NOTE — Progress Notes (Signed)
Utilization review completed. Auri Jahnke, RN, BSN. 

## 2014-12-05 NOTE — Progress Notes (Signed)
  Echocardiogram 2D Echocardiogram has been performed.  Stephen [name] 12/05/2014, 9:11 AM

## 2014-12-05 NOTE — Progress Notes (Signed)
Extubation note:  At bedside in PACU. Pt following commands with adequate TV's (>500cc's) and coughing on PS 28mmHg/ 78mmHgPEEP. Oropharynx suctioned by RT and pt extubated and placed on [place] without event. Pt with adequate respiratory rate and chest excursion on [place]. Deep coughing encouraged.  Suzette Battiest, MD

## 2014-12-05 NOTE — Progress Notes (Signed)
Patient: Stephen [name] / Admit Date: 12/03/2014 / Date of Encounter: 12/05/2014, 11:23 AM   Subjective: Confused. Pulled out IV line overnight. No CP. C/o that he can't lie flat.   Objective: Telemetry: sinus tach, occ PVCs, occ vent trigeminy, occasional couplets Physical Exam: Blood pressure 145/63, pulse 108, temperature 98.4 F (36.9 C), temperature source Oral, resp. rate 16, height 5\' 10"  (1.778 m), weight 166 lb 0.1 oz (75.3 kg), SpO2 98 %. General: Well developed sallow complexion WM, in no acute distress Head: Normocephalic, atraumatic, sclera non-icteric, no xanthomas, nares are without discharge. Neck: JVP not elevated. Lungs: Diffusely diminished without wheezes, rales, or rhonchi. Breathing is unlabored. Heart: RRR S1 S2 without murmurs, rubs, or gallops.  Abdomen: Soft, non-tender, non-distended with normoactive bowel sounds. No rebound/guarding. Extremities: No clubbing or cyanosis. No edema.  Neuro: Oriented to self but not date and place beyond [place]. Follows some commands but loses interest easily.   Intake/Output Summary (Last 24 hours) at 12/05/14 1123 Last data filed at 12/05/14 0800  Gross per 24 hour  Intake   3012 ml  Output   1500 ml  Net   1512 ml    Inpatient Medications:  . antiseptic oral rinse  7 mL Mouth Rinse BID  . aspirin  325 mg Oral Daily  . atorvastatin  40 mg Oral Daily  . clopidogrel  75 mg Oral Daily  . furosemide  40 mg Oral Daily  . gabapentin  300 mg Oral QHS  . insulin aspart  0-15 Units Subcutaneous TID WC  . isosorbide mononitrate  30 mg Oral Daily  . lipase/protease/amylase  72,000 Units Oral TID WC  . lisinopril  40 mg Oral Daily  . metolazone  2.5 mg Oral Daily  . metoprolol tartrate  25 mg Oral BID  . nicotine  14 mg Transdermal Daily  . pantoprazole  40 mg Oral Daily  . piperacillin-tazobactam (ZOSYN)  IV  3.375 g Intravenous Q8H  . sodium chloride  3 mL Intravenous Q12H  . spironolactone  25 mg Oral Daily  .  tiotropium  18 mcg Inhalation Daily  . vancomycin  750 mg Intravenous Q12H   Infusions:  . sodium chloride 75 mL/hr at 12/04/14 2127  . heparin 1,700 Units/hr (12/05/14 0800)    Labs:  Recent Labs  12/04/14 0418 12/05/14 0015  NA 134* 131*  K 3.8 3.7  CL 93* 90*  CO2 31 31  GLUCOSE 160* 178*  BUN 23 22  CREATININE 0.99 1.06  CALCIUM 8.8 8.8    Recent Labs  12/04/14 0418  AST 13  ALT 9  ALKPHOS 79  BILITOT 0.4  PROT 6.2  ALBUMIN 2.0*    Recent Labs  12/03/14 1710 12/04/14 0418 12/05/14 0015  WBC 15.3* 13.7* 15.2*  NEUTROABS 12.6*  --   --   HGB 9.7* 9.0* 7.7*  HCT 29.5* 28.7* 24.8*  MCV 90.5 90.5 90.2  PLT 548* 576* 537*    Recent Labs  12/03/14 1710 12/04/14 0418 12/04/14 1010 12/04/14 1538  TROPONINI 0.09* 0.15* 0.10* 0.08*   Invalid input(s): POCBNP  Recent Labs  12/04/14 0418  HGBA1C 6.6*     Radiology/Studies:  Dg Chest 1 View  12/03/2014   CLINICAL DATA:  Bilateral lower extremity swelling.  EXAM: CHEST - 1 VIEW  COMPARISON:  PA and lateral chest 10/05/2014 the hand 10/23/2013.  FINDINGS: Heart size is normal. The patient is status post CABG. Lungs are clear. No pneumothorax or pleural effusion.  IMPRESSION: No acute  disease.   Electronically Signed   By: Inge Rise M.D.   On: 12/03/2014 17:47   Dg Tibia/fibula Left  12/03/2014   CLINICAL DATA:  Bilateral lower extremity swelling. Wound over the medial malleolus of the left ankle.  EXAM: LEFT TIBIA AND FIBULA - 2 VIEW  COMPARISON:  None.  FINDINGS: Wound over the medial malleolus is identified. There may be an additional wound posteriorly along the left lower leg. a No acute bony or joint abnormality is seen. Atherosclerosis is noted.  IMPRESSION: Skin wounds without underlying acute bony or joint abnormality.  Atherosclerosis.   Electronically Signed   By: Inge Rise M.D.   On: 12/03/2014 17:49   Dg Ankle Complete Left  12/03/2014   CLINICAL DATA:  Bilateral leg swelling,  bilateral ankle swelling, large round necrotic sore overlying medial malleolus on left ankle  EXAM: LEFT ANKLE COMPLETE - 3+ VIEW  COMPARISON:  None.  FINDINGS: No fracture.  Ankle mortise is normally spaced and aligned.  There is a soft tissue defect extending to the surface of the bone overlying the medial malleolus. No bone resorption is seen to suggest osteomyelitis. There is no soft tissue air. No radiopaque foreign body.  Mild diffuse subcutaneous soft tissue edema is noted of the lower leg and ankle.  IMPRESSION: No fracture.  No evidence of osteomyelitis.   Electronically Signed   By: Lajean Manes M.D.   On: 12/03/2014 17:47   Dg Ankle Complete Right  12/03/2014   CLINICAL DATA:  Bilateral lower extremity swelling and pain. Initial encounter.  EXAM: RIGHT ANKLE - COMPLETE 3+ VIEW  COMPARISON:  None.  FINDINGS: Soft tissue swelling is seen about the right ankle. No underlying acute bony or joint abnormality is identified. No soft tissue gas collection is seen. Atherosclerosis is noted.  IMPRESSION: Soft tissue swelling.  Otherwise negative.   Electronically Signed   By: Inge Rise M.D.   On: 12/03/2014 17:50     Assessment and Plan  1. 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 CAD s/p PCI, CABG 2011 with mildly elevated troponin 3. Ischemic cardiomyopathy/chronic systolic CHF 4. Acute on chronic anemia 5. Poor functional baseline 6. HTN 7. HLD 8. Diabetes mellitus 9. Confusion  Troponin elevation is nonspecific in setting of above. He has not had any chest pain recently. 2D echo 12/05/14: mod-severely reduced LV function, EF 30-35%, moderate diffuse HK with severe HK of inferior MK, mild-mod MR. EF down from prior echo (45-50% in 10/2013) but cath in 10/2013 was 40% with 3/3 patent bypass grafts (cath done at that time for + troponin in setting of COPD exacerbation). Do not anticipate further invasive cardiac eval at present time. Also of note, he became  confused overnight and pulled out his IVs. Mental status remains foggy right now. Vascular is aware of this. Hgb 11 range at baseline, now 7.7. Dr. Irish Lack discussed with Dr. [name] who still recommends proceeding with surgery as it is felt that he will not begin to improve until the amputation occurs. May need to consider diuresis post-surgery +/- transfusion to keep Hgb afloat given underlying CAD and recently positive troponin. Marginal elevation in troponin is not consistent with acute coronary syndrome, although it does portend poor overall cardiovascular prognosis  Signed, Dayna Dunn PA-C  I have examined the patient and reviewed assessment and plan and discussed with patient.  Agree with above as stated.  Decreased EF.  WOuld avoid excessive IV fluids to avoid  CHF.  Most recent cath showed patent grafts.  He appears ill today and part of that is his infected leg.  D/w Dr. [name].  WOuld proceed with surgery.  He is not low risk but there are no better options for treatment.  Will follow.   Jahmere Bramel S.

## 2014-12-05 NOTE — Anesthesia Preprocedure Evaluation (Addendum)
Anesthesia Evaluation  Patient identified by MRN, date of birth, ID band Patient awake    Reviewed: Allergy & Precautions, NPO status , Patient's Chart, lab work & pertinent test results, reviewed documented beta blocker date and time   Airway Mallampati: II  TM Distance: >3 FB Neck ROM: Full    Dental no notable dental hx. (+) Edentulous Upper, Edentulous Lower   Pulmonary pneumonia -, COPDCurrent Smoker,  breath sounds clear to auscultation  Pulmonary exam normal       Cardiovascular Exercise Tolerance: Poor hypertension, Pt. on medications and Pt. on home beta blockers + CAD, + Past MI, + Cardiac Stents, + CABG, + Peripheral Vascular Disease and +CHF + Valvular Problems/Murmurs MR Rhythm:Regular Rate:Normal  Echo 12/05/2014 - Left ventricle: The cavity size was mildly dilated. Wall thickness was normal. Systolic function was moderately toseverely reduced. The estimated ejection fraction was in therange of 30% to 35%. Moderate diffuse hypokinesis with distinctregional wall motion abnormalities. Severe hypokinesis of theinferior myocardium. Due to tachycardia, there was fusion ofearly and atrial contributions to ventricular filling. The studyis not technically sufficient to allow evaluation of LV diastolicfunction. - Mitral valve: There was mild to moderate regurgitation directedcentrally. - Left atrium: The atrium was mildly dilated.    Neuro/Psych negative neurological ROS     GI/Hepatic PUD, GERD-  ,(+)     substance abuse  alcohol use,   Endo/Other  diabetes, Type 2, Oral Hypoglycemic Agents, Insulin Dependent  Renal/GU negative Renal ROS     Musculoskeletal  (+) Arthritis -,   Abdominal   Peds  Hematology negative hematology ROS (+)   Anesthesia Other Findings Profound anemia.  Type and screen sent  Reproductive/Obstetrics negative OB ROS                           Anesthesia Physical Anesthesia Plan  ASA: III  Anesthesia Plan: General   Post-op Pain Management:    Induction: Intravenous  Airway Management Planned: Oral ETT  Additional Equipment: None  Intra-op Plan:   Post-operative Plan: Extubation in OR  Informed Consent: I have reviewed the patients History and Physical, chart, labs and discussed the procedure including the risks, benefits and alternatives for the proposed anesthesia with the patient or authorized representative who has indicated his/her understanding and acceptance.   Dental advisory given  Plan Discussed with: CRNA  Anesthesia Plan Comments:         Anesthesia Quick Evaluation

## 2014-12-05 NOTE — Anesthesia Procedure Notes (Signed)
Procedure Name: Intubation Date/Time: 12/05/2014 1:11 PM Performed by: Williemae Area B Pre-anesthesia Checklist: Patient identified, Emergency Drugs available, Suction available and Patient being monitored Patient Re-evaluated:Patient Re-evaluated prior to inductionPreoxygenation: Pre-oxygenation with 100% oxygen Intubation Type: IV induction Ventilation: Mask ventilation without difficulty Laryngoscope Size: Mac and 4 Grade View: Grade II Tube type: Oral Tube size: 7.5 mm Number of attempts: 1 Airway Equipment and Method: Stylet Placement Confirmation: ETT inserted through vocal cords under direct vision,  breath sounds checked- equal and bilateral and positive ETCO2 Secured at: 22 (cm at gum) cm Tube secured with: Tape Dental Injury: Teeth and Oropharynx as per pre-operative assessment

## 2014-12-05 NOTE — Transfer of Care (Signed)
Immediate Anesthesia Transfer of Care Note  Patient: LEMONT SITZMANN  Procedure(s) Performed: Procedure(s): AMPUTATION ABOVE KNEE (Left)  Patient Location: PACU  Anesthesia Type:General  Level of Consciousness: awake and Patient remains intubated per anesthesia plan  Airway & Oxygen Therapy: Patient Spontanous Breathing, Patient remains intubated per anesthesia plan, Patient placed on Ventilator (see vital sign flow sheet for setting) and support mode; pt beginning to struggle and requir his mitts restrained  Post-op Assessment: Report given to RN, Post -op Vital signs reviewed and stable, Patient moving all extremities and Dr. Ola Spurr called to evaluate for sedation or extubation  Post vital signs: Reviewed and stable  Last Vitals:  Filed Vitals:   12/05/14 1118  BP: 145/63  Pulse: 108  Temp:   Resp:     Complications: No apparent anesthesia complications

## 2014-12-05 NOTE — Progress Notes (Signed)
ANTICOAGULATION CONSULT NOTE - Follow Up Consult  Pharmacy Consult for heparin Indication: chest pain/ACS  Labs:  Recent Labs  12/03/14 1710 12/04/14 0418  12/04/14 1010 12/04/14 1538 12/05/14 0015 12/05/14 0911  HGB 9.7* 9.0*  --   --   --  7.7*  --   HCT 29.5* 28.7*  --   --   --  24.8*  --   PLT 548* 576*  --   --   --  537*  --   LABPROT 13.0  --   --   --   --   --   --   INR 0.97  --   --   --   --   --   --   HEPARINUNFRC  --   --   < >  --  <0.10* 0.22* 0.33  CREATININE 1.30 0.99  --   --   --  1.06  --   TROPONINI 0.09* 0.15*  --  0.10* 0.08*  --   --   < > = values in this interval not displayed.   Assessment: 74yo male now therapeutic on heparin for chest pain AKA planned for later today  Goal of Therapy:  Heparin level 0.3-0.7 units/ml   Plan:  Continue heparin at 1700 units / hr Follow up plan for heparin -- stop?  Thank you. Anette Guarneri, PharmD [phone]  12/05/2014,10:55 AM

## 2014-12-05 NOTE — Op Note (Signed)
[facility] OPERATIVE NOTE  Procedure: Left above knee amputation  Surgeon(s): Elam Dutch, MD  ASSISTANT: Gaye Alken, RNFA  Anesthesia: General  Specimens: Left leg  PROCEDURE DETAIL: After obtaining informed consent, the patient was taken to the operating room. The patient was placed in supine position the operating room table. After induction of general anesthesia the patient's entire left lower extremity was prepped and draped in usual sterile fashion. A circumferential incision was made on the left leg just above the knee. The incision was carried down into the sucutaneous tissues down to level the saphenous vein. This was ligated and divided between silk ties. Soft tissues were taken down as well as the muscle and fascia with cautery. The superficial femoral artery and vein were dissected free circumferentially clamped and divided. These were suture ligated proximally. Remainder of the soft tissues were taken down with cautery. The periosteum was raised on the femur approximately 5 cm above the skin edge. The femur was divided at this level. The leg was passed off the table as a specimen. Hemostasis was obtained. The wound was thoroughly irrigated with normal saline solution. The fascial edges were reapproximated using interrupted 2 0 Vicryl sutures. The subcutaneous tissues reapproximated using a running 3-0 Vicryl suture. The skin was closed staples. Patient tolerated procedure well and there were no complications. Instrument sponge and needle counts correct in the case. Patient was taken to recovery in stable condition.  Ruta Hinds, MD [facility] Office: [phone] Pager: [phone]

## 2014-12-05 NOTE — Progress Notes (Signed)
ANTICOAGULATION CONSULT NOTE - Follow Up Consult  Pharmacy Consult for heparin Indication: chest pain/ACS  Labs:  Recent Labs  12/03/14 1710 12/04/14 0418 12/04/14 0630 12/04/14 1010 12/04/14 1538 12/05/14 0015  HGB 9.7* 9.0*  --   --   --  7.7*  HCT 29.5* 28.7*  --   --   --  24.8*  PLT 548* 576*  --   --   --  537*  LABPROT 13.0  --   --   --   --   --   INR 0.97  --   --   --   --   --   HEPARINUNFRC  --   --  <0.10*  --  <0.10* 0.22*  CREATININE 1.30 0.99  --   --   --  1.06  TROPONINI 0.09* 0.15*  --  0.10* 0.08*  --      Assessment: 74yo male remains subtherapeutic on heparin after rate increases though approaching goal.  Goal of Therapy:  Heparin level 0.3-0.7 units/ml   Plan:  Will increase heparin gtt by 2-3 units/kg/hr to 1700 units/hr and check level in 6-8hr.  Wynona Neat, PharmD, BCPS  12/05/2014,1:36 AM

## 2014-12-05 NOTE — Consult Note (Signed)
Pt confused, "wants water"  Filed Vitals:   12/04/14 1941 12/04/14 2333 12/05/14 0348 12/05/14 0758  BP: 100/80 128/67 135/67 145/63  Pulse: 117 114 114 107  Temp: 98.2 F (36.8 C) 98.4 F (36.9 C) 99.1 F (37.3 C) 98.4 F (36.9 C)  TempSrc: Axillary Oral Oral Oral  Resp: 18 15 15 16   Height:      Weight:      SpO2: 98% 96% 95% 98%   Left leg necrotic ulcer with eschar left medial malleolus and scattered satellite ulcers Right leg ulcers essentially healed at this point 2+ femoral bilat, absent pop pedal pulses  Neuro: moves extremities alert and oriented x 0  Pt with sepsis/cellulitis left leg which is non salvageable.  Pt with known CAD and poor overall functional and cardiac status  Plan for left AKA later today.  Consent obtained by phone with wife.  Risks primarily cardiac events other risks resp failure infection bleeding, non healing AKA.  Keep NPO  Ruta Hinds, MD [facility] Office: [phone] Pager: [phone]

## 2014-12-05 NOTE — Progress Notes (Signed)
[facility]                                                                              Patient Demographics  Stephen [name], is a 74 y.o. male, DOB - 05-12-1941, KVQ:[id number]  Admit date - 12/03/2014   Admitting Physician Sid Falcon, MD  Outpatient Primary MD for the patient is Reynold Bowen, NT  LOS - 2   Chief Complaint  Patient presents with  . Leg Swelling      HPI on 12/03/2014 by Dr. Linna Darner Stephen [name] is a 74 y.o. Male.Leg pain: L. Getting worse. Located in L ankle w/o radiation. Open sore and w/ purulent discharge. Open wound on ankle present for 8-9 mo. Associated w/ generalized body aches. Deneis fevers, SOB, palpitations. Hydrocodone w/o benefit in ankle. Pain is worse w/ movement.  CP a couple nights ago that was relieved w/ "CP pill." but pt unsure of what medicine it was he took.  CBG at home range from 400-500 routinely  Assessment & Plan   Peripheral Vascular Disease -patient has had stenting of the bilateral iliac systems for ulcer -Healed ulcers on right lower extremity -Nonhealing open wounds on Left lower ext.   -Vascular surgery consulted and appreciated, patient not a candidate from fempop bypass, will likely need an amputation; pending evaluation by Dr. [name] -Continue vancomycin and zosyn -Patient is a high risk candidate for surgery  Elevated troponin -Patient had chest pain a few days ago, currently chest pain free -Cardiology consulted and appreciated and does not feel this is secondary to ACS.  Pending echocardiogram results, if no change, discontinue heparin -Continue heparin drip -Continue aspirin and plavix, statin, metoprolol, lisinopril  Sepsis secondary to LLE cellulitis/nonhealing uclers -Treatment and plan as above -Patient continues to have low grade temp, leukocytosis, tachycardia  Chronic systolic and diastolic heart failure -Echocardiogram December 2014 shows EF of 64-33%, grade 1 diastolic dysfunction -Continue  home regimen of Lasix, metolazone, spironolactone, metoprolol, lisinopril -Monitor daily weights, intake and output -Echocardiogram pending  Diabetes mellitus, type II -Poorly controlled -Last hemoglobin A1c in October 2015 9.3 -Continue Lantus, insulin sliding scale, CBG monitoring -Metformin and glipizide held  Hypertension -Continue home regimen: Metoprolol, lisinopril, diuretics  Pancreatic insufficiency -Continue creon  COPD -Currently compensated, no wheezing -Continue Symbicort, Spiriva  Neuropathic pain -Continue Neurontin  Hyperlipidemia -Continue statin  Tobacco abuse -Patient counseled continue nicotine patch  Code Status: DNR  Family Communication: None at bedside  Disposition Plan: Admitted  Time Spent in minutes   30 minutes  Procedures  None  Consults   Cardiology Vascular Surgery  DVT Prophylaxis  Heparin  Lab Results  Component Value Date   PLT 537* 12/05/2014    Medications  Scheduled Meds: . antiseptic oral rinse  7 mL Mouth Rinse BID  . aspirin  325 mg Oral Daily  . atorvastatin  40 mg Oral Daily  . clopidogrel  75 mg Oral Daily  . furosemide  40 mg Oral Daily  . gabapentin  300 mg Oral QHS  . insulin aspart  0-15 Units Subcutaneous TID WC  . isosorbide mononitrate  30 mg Oral Daily  . lipase/protease/amylase  72,000 Units Oral TID  WC  . lisinopril  40 mg Oral Daily  . metolazone  2.5 mg Oral Daily  . metoprolol tartrate  25 mg Oral BID  . nicotine  14 mg Transdermal Daily  . pantoprazole  40 mg Oral Daily  . piperacillin-tazobactam (ZOSYN)  IV  3.375 g Intravenous Q8H  . sodium chloride  3 mL Intravenous Q12H  . spironolactone  25 mg Oral Daily  . tiotropium  18 mcg Inhalation Daily  . vancomycin  750 mg Intravenous Q12H   Continuous Infusions: . sodium chloride 75 mL/hr at 12/04/14 2127  . heparin 1,700 Units/hr (12/05/14 0547)   PRN Meds:.acetaminophen **OR** acetaminophen, budesonide-formoterol, levalbuterol,  lipase/protease/amylase, morphine injection, ondansetron **OR** ondansetron (ZOFRAN) IV  Antibiotics    Anti-infectives    Start     Dose/Rate Route Frequency Ordered Stop   12/04/14 1200  vancomycin (VANCOCIN) IVPB 750 mg/150 ml premix     750 mg150 mL/hr over 60 Minutes Intravenous Every 12 hours 12/03/14 2216     12/04/14 0400  piperacillin-tazobactam (ZOSYN) IVPB 3.375 g     3.375 g12.5 mL/hr over 240 Minutes Intravenous Every 8 hours 12/03/14 2216     12/03/14 2100  piperacillin-tazobactam (ZOSYN) IVPB 3.375 g     3.375 g100 mL/hr over 30 Minutes Intravenous  Once 12/03/14 2024 12/04/14 0011   12/03/14 2030  vancomycin (VANCOCIN) 1,500 mg in sodium chloride 0.9 % 500 mL IVPB     1,500 mg250 mL/hr over 120 Minutes Intravenous  Once 12/03/14 2024 12/04/14 0200        Subjective:   [name] seen and examined today.  Denies any pain at this time.  Inquires about going home.  Denies chest pain or shortness of breath.   Objective:   Filed Vitals:   12/04/14 1941 12/04/14 2333 12/05/14 0348 12/05/14 0758  BP: 100/80 128/67 135/67 145/63  Pulse: 117 114 114   Temp: 98.2 F (36.8 C) 98.4 F (36.9 C) 99.1 F (37.3 C) 98.4 F (36.9 C)  TempSrc: Axillary Oral Oral Oral  Resp: 18 15 15 16   Height:      Weight:      SpO2: 98% 96% 95%     Wt Readings from Last 3 Encounters:  12/03/14 75.3 kg (166 lb 0.1 oz)  11/03/14 77.111 kg (170 lb)  09/23/14 77.111 kg (170 lb)     Intake/Output Summary (Last 24 hours) at 12/05/14 2423 Last data filed at 12/05/14 0500  Gross per 24 hour  Intake   2828 ml  Output   1050 ml  Net   1778 ml    Exam  General: Well developed, malnourished appears stated age  17: NCAT, mucous membranes moist.   Cardiovascular: S1 S2 auscultated, no rubs, murmurs or gallops. Regular rate and rhythm.  Respiratory: Diminished breath sounds, rhonchi lung bases  Abdomen: Soft, nontender, nondistended, + bowel sounds  Extremities: warm dry  without cyanosis clubbing; +1 edema in LE   Neuro: AAOx3, nonfocal, sleepy  Skin: LLE: uclerations in various stages, nonhealing ucler on medial malleolus, erythema  Data Review   Micro Results Recent Results (from the past 240 hour(s))  MRSA PCR Screening     Status: None   Collection Time: 12/03/14  9:31 PM  Result Value Ref Range Status   MRSA by PCR NEGATIVE NEGATIVE Final    Comment:        The GeneXpert MRSA Assay (FDA approved for NASAL specimens only), is one component of a comprehensive MRSA colonization surveillance program.  It is not intended to diagnose MRSA infection nor to guide or monitor treatment for MRSA infections.     Radiology Reports Dg Chest 1 View  12/03/2014   CLINICAL DATA:  Bilateral lower extremity swelling.  EXAM: CHEST - 1 VIEW  COMPARISON:  PA and lateral chest 10/05/2014 the hand 10/23/2013.  FINDINGS: Heart size is normal. The patient is status post CABG. Lungs are clear. No pneumothorax or pleural effusion.  IMPRESSION: No acute disease.   Electronically Signed   By: Inge Rise M.D.   On: 12/03/2014 17:47   Dg Tibia/fibula Left  12/03/2014   CLINICAL DATA:  Bilateral lower extremity swelling. Wound over the medial malleolus of the left ankle.  EXAM: LEFT TIBIA AND FIBULA - 2 VIEW  COMPARISON:  None.  FINDINGS: Wound over the medial malleolus is identified. There may be an additional wound posteriorly along the left lower leg. a No acute bony or joint abnormality is seen. Atherosclerosis is noted.  IMPRESSION: Skin wounds without underlying acute bony or joint abnormality.  Atherosclerosis.   Electronically Signed   By: Inge Rise M.D.   On: 12/03/2014 17:49   Dg Ankle Complete Left  12/03/2014   CLINICAL DATA:  Bilateral leg swelling, bilateral ankle swelling, large round necrotic sore overlying medial malleolus on left ankle  EXAM: LEFT ANKLE COMPLETE - 3+ VIEW  COMPARISON:  None.  FINDINGS: No fracture.  Ankle mortise is normally  spaced and aligned.  There is a soft tissue defect extending to the surface of the bone overlying the medial malleolus. No bone resorption is seen to suggest osteomyelitis. There is no soft tissue air. No radiopaque foreign body.  Mild diffuse subcutaneous soft tissue edema is noted of the lower leg and ankle.  IMPRESSION: No fracture.  No evidence of osteomyelitis.   Electronically Signed   By: Lajean Manes M.D.   On: 12/03/2014 17:47   Dg Ankle Complete Right  12/03/2014   CLINICAL DATA:  Bilateral lower extremity swelling and pain. Initial encounter.  EXAM: RIGHT ANKLE - COMPLETE 3+ VIEW  COMPARISON:  None.  FINDINGS: Soft tissue swelling is seen about the right ankle. No underlying acute bony or joint abnormality is identified. No soft tissue gas collection is seen. Atherosclerosis is noted.  IMPRESSION: Soft tissue swelling.  Otherwise negative.   Electronically Signed   By: Inge Rise M.D.   On: 12/03/2014 17:50    CBC  Recent Labs Lab 12/03/14 1710 12/04/14 0418 12/05/14 0015  WBC 15.3* 13.7* 15.2*  HGB 9.7* 9.0* 7.7*  HCT 29.5* 28.7* 24.8*  PLT 548* 576* 537*  MCV 90.5 90.5 90.2  MCH 29.8 28.4 28.0  MCHC 32.9 31.4 31.0  RDW 14.5 14.6 14.9  LYMPHSABS 1.5  --   --   MONOABS 1.1*  --   --   EOSABS 0.1  --   --   BASOSABS 0.0  --   --     Chemistries   Recent Labs Lab 12/03/14 1710 12/04/14 0418 12/05/14 0015  NA 132* 134* 131*  K 4.3 3.8 3.7  CL 90* 93* 90*  CO2 32 31 31  GLUCOSE 250* 160* 178*  BUN 31* 23 22  CREATININE 1.30 0.99 1.06  CALCIUM 8.8 8.8 8.8  AST  --  13  --   ALT  --  9  --   ALKPHOS  --  79  --   BILITOT  --  0.4  --    ------------------------------------------------------------------------------------------------------------------ estimated creatinine clearance  is 65 mL/min (by C-G formula based on Cr of 1.06). ------------------------------------------------------------------------------------------------------------------ No results  for input(s): HGBA1C in the last 72 hours. ------------------------------------------------------------------------------------------------------------------ No results for input(s): CHOL, HDL, LDLCALC, TRIG, CHOLHDL, LDLDIRECT in the last 72 hours. ------------------------------------------------------------------------------------------------------------------ No results for input(s): TSH, T4TOTAL, T3FREE, THYROIDAB in the last 72 hours.  Invalid input(s): FREET3 ------------------------------------------------------------------------------------------------------------------ No results for input(s): VITAMINB12, FOLATE, FERRITIN, TIBC, IRON, RETICCTPCT in the last 72 hours.  Coagulation profile  Recent Labs Lab 12/03/14 1710  INR 0.97    No results for input(s): DDIMER in the last 72 hours.  Cardiac Enzymes  Recent Labs Lab 12/04/14 0418 12/04/14 1010 12/04/14 1538  TROPONINI 0.15* 0.10* 0.08*   ------------------------------------------------------------------------------------------------------------------ Invalid input(s): POCBNP    Veronda Gabor D.O. on 12/05/2014 at 8:38 AM  Between 7am to 7pm - Pager - [phone]  After 7pm go to [email or web address] - password [email or web address]  And look for the night coverage person covering for me after hours  [facility] Office  [phone]

## 2014-12-05 NOTE — Progress Notes (Signed)
Pt very agitated. Confused and in restraints preop per Georgina Pillion.  Dr. Ola Spurr to bedside. Considering extubation. Pt squeezing his hand strong.

## 2014-12-05 NOTE — Progress Notes (Signed)
Dr.Fitzgerald and respiratory extubated pt. o2 sat = 92% on 4L/[place]

## 2014-12-05 NOTE — Progress Notes (Signed)
INITIAL NUTRITION ASSESSMENT  DOCUMENTATION CODES Per approved criteria  -Not Applicable   INTERVENTION:  Recommend swallow evaluation with SLP to determine safest diet consistency.  Once diet advanced, will likely need PO supplements to maximize intake of protein and calories.  NUTRITION DIAGNOSIS: Increased nutrient needs related to AKA as evidenced by estimated protein and calorie needs.   Goal: Intake to meet >90% of estimated nutrition needs.  Monitor:  PO intake, labs, weight trend.  Reason for Assessment: MD Consult for assessment of nutrition status  74 y.o. male  Admitting Dx: Ulcer of left lower extremity  ASSESSMENT: Patient admitted on 1/30 with left ankle pain, open sore with purulent discharge. Wound present for 8-9 months.  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 Currently NPO, previously on dysphagia 1 diet with thin liquids. Per discussion with RN, patient has been NPO today for surgery and he has been very confused. She reports some coughing with PO's. Noted chronic cleft palate from traumatic injury as a child. Patient was on a dysphagia 1 (pureed) diet with thin liquids yesterday. Unsure of usual diet. Recommend swallow evaluation with SLP.  Height: Ht Readings from Last 1 Encounters:  12/03/14 5\' 10"  (1.778 m)    Weight: Wt Readings from Last 1 Encounters:  12/03/14 166 lb 0.1 oz (75.3 kg)    Ideal Body Weight: 75.5 kg   % Ideal Body Weight: 100%  Wt Readings from Last 10 Encounters:  12/03/14 166 lb 0.1 oz (75.3 kg)  11/03/14 170 lb (77.111 kg)  09/23/14 170 lb (77.111 kg)  09/09/14 172 lb (78.019 kg)  09/08/14 172 lb (78.019 kg)  08/29/14 192 lb (87.091 kg)  08/19/14 215 lb 3.4 oz (97.62 kg)  08/08/14 198 lb (89.812 kg)  07/27/14 190 lb 12.8 oz (86.546 kg)  07/23/14 202 lb 6.1 oz (91.8 kg)    Usual Body Weight: 170 lb  % Usual Body Weight: 98%  BMI:  Body mass index is 23.82 kg/(m^2).  Estimated Nutritional Needs: Kcal:  2000-2200 Protein: 115-130 gm Fluid: 2-2.2 L  Skin: stage 2 pressure ulcer to left and right buttocks, diabetic ulcer to left ankle  Diet Order: Diet NPO time specified  EDUCATION NEEDS: -Education not appropriate at this time   Intake/Output Summary (Last 24 hours) at 12/05/14 1252 Last data filed at 12/05/14 0800  Gross per 24 hour  Intake   2692 ml  Output   1500 ml  Net   1192 ml    Last BM: 1/29   Labs:   Recent Labs Lab 12/03/14 1710 12/04/14 0418 12/05/14 0015  NA 132* 134* 131*  K 4.3 3.8 3.7  CL 90* 93* 90*  CO2 32 31 31  BUN 31* 23 22  CREATININE 1.30 0.99 1.06  CALCIUM 8.8 8.8 8.8  GLUCOSE 250* 160* 178*    CBG (last 3)   Recent Labs  12/04/14 2129 12/05/14 0803 12/05/14 1132  GLUCAP 156* 167* 134*    Scheduled Meds: . [MAR Hold] antiseptic oral rinse  7 mL Mouth Rinse BID  . [MAR Hold] aspirin  325 mg Oral Daily  . [MAR Hold] atorvastatin  40 mg Oral Daily  . [MAR Hold] clopidogrel  75 mg Oral Daily  . [MAR Hold] furosemide  40 mg Oral Daily  . [MAR Hold] gabapentin  300 mg Oral QHS  . [MAR Hold] insulin aspart  0-15 Units Subcutaneous TID WC  . [MAR Hold] isosorbide mononitrate  30 mg Oral Daily  . [facility] Hold]  lipase/protease/amylase  72,000 Units Oral TID WC  . [MAR Hold] lisinopril  40 mg Oral Daily  . [MAR Hold] metolazone  2.5 mg Oral Daily  . [MAR Hold] metoprolol tartrate  25 mg Oral BID  . [MAR Hold] nicotine  14 mg Transdermal Daily  . [MAR Hold] pantoprazole  40 mg Oral Daily  . [MAR Hold] piperacillin-tazobactam (ZOSYN)  IV  3.375 g Intravenous Q8H  . [MAR Hold] sodium chloride  3 mL Intravenous Q12H  . [MAR Hold] spironolactone  25 mg Oral Daily  . [MAR Hold] tiotropium  18 mcg Inhalation Daily  . [facility] Hold] vancomycin  750 mg Intravenous Q12H    Continuous Infusions: . sodium chloride 75 mL/hr at 12/04/14 2127  . heparin 1,700 Units/hr (12/05/14 0800)    Past Medical History  Diagnosis Date  . Diabetes mellitus    . Hypertension   . Pancreatitis   . Neuropathy   . Hyperlipemia   . Peripheral vascular disease     a. 10/2013: severely abnormal ABIs, for OP evaluation.  [name] GERD (gastroesophageal reflux disease)   . S/P colonoscopy July 2003    hyperplastic polyp, diverticulosis, anal papilla and internal hemorrhoids r  . S/P endoscopy 2003    Dr. Braulio Bosch: erosive reflux esophagitis , PUD  . PUD (peptic ulcer disease)     diagnosed via EGD by Dr. Braulio Bosch at [place]  . Diverticulosis   . Hemorrhoids   . Coronary artery disease      a. BMS to LAD 1999. b. HSRA to LAD and stent to RCA 2007. c. NSTEMI/CABG x 3 in 04/2010. d. NSTEMI 10/2013 - secondary to demand ischemia in setting of COPD exacerbation with severe underlying CAD - cath with 3/3 patent grafts, for medical therapy.   [name] COPD (chronic obstructive pulmonary disease)     a. Ongoing tobacco abuse.  . Arthritis   . Neutropenia     a. Dx 10/2013 on labs - instructed to f/u PCP.  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   . Ischemic cardiomyopathy     a. previously EF 30% in 2011. b. 10/2013: EF 45-50% by echo.  . Alcoholism     a. Remote alcoholism  . 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    Past Surgical History  Procedure Laterality Date  . Coronary artery bypass graft  June 2011    X3  . Right inguinal hernia repair    . Cholecystectomy      3-4 years ago  . Appendectomy      age 89  . Cataracts    . Colonoscopy  05/12/2002    Dr. [name]- diverticulosis, anal papilla, internal hemorrhoids, rectal polyps  . [name] dilation  04/16/2012    Schatzki's ring-status post dilation and disruption/ as described above. Grade 1 esophageal varices. Small hiatal hernia.  [name] dilation  04/16/2012    Procedure: [name] DILATION;  Surgeon: Daneil Dolin, MD;  Location: [facility];  Service: Endoscopy;  Laterality:  N/A;  . Colonoscopy  08/10/2012    Procedure: COLONOSCOPY;  Surgeon: Daneil Dolin, MD;  Location: [facility];  Service: Endoscopy;  Laterality: N/A;  9:45 needs 30 mins extra /have Glucagon on hand  . Left heart catheterization with coronary/graft angiogram N/A 10/29/2013    Procedure: LEFT HEART CATHETERIZATION WITH [name];  Surgeon: Burnell Blanks,  MD;  Location: [facility] CATH LAB;  Service: Cardiovascular;  Laterality: N/A;  . Abdominal aortagram N/A 09/09/2014    Procedure: ABDOMINAL Maxcine Ham;  Surgeon: Elam Dutch, MD;  Location: [facility] CATH LAB;  Service: Cardiovascular;  Laterality: N/A;  . Atherectomy Bilateral 09/23/2014    Procedure: ATHERECTOMY;  Surgeon: Elam Dutch, MD;  Location: [facility] CATH LAB;  Service: Cardiovascular;  Laterality: Bilateral;  iliacs    Molli Barrows, [place], LDN, [place] Pager [phone] After Hours Pager [phone]

## 2014-12-05 NOTE — Procedures (Signed)
Extubation Procedure Note  Patient Details:   Name: Stephen [name] DOB: [birth year] MRN: [id number]   Airway Documentation:     Evaluation  O2 sats: stable throughout Complications: No apparent complications Patient did tolerate procedure well. Bilateral Breath Sounds: Rhonchi Suctioning: Airway Yes  Patient tolerated wean. MD ordered to extubate and at bedside. Positve for cuff leak. Patient extubated to a 2 Lpm nasal cannula. No signs of dyspnea or stridor. RN at bedside. Patient resting comfortably.   Myrtie Neither 12/05/2014, 3:19 PM

## 2014-12-05 NOTE — Anesthesia Postprocedure Evaluation (Signed)
  Anesthesia Post-op Note  Patient: Stephen [name]  Procedure(s) Performed: Procedure(s): AMPUTATION ABOVE KNEE (Left)  Patient Location: PACU  Anesthesia Type:General  Level of Consciousness: awake and alert   Airway and Oxygen Therapy: Patient Spontanous Breathing  Post-op Pain: none  Post-op Assessment: Post-op Vital signs reviewed  Post-op Vital Signs: Reviewed  Last Vitals:  Filed Vitals:   12/05/14 1519  BP:   Pulse:   Temp: 36.1 C  Resp:     Complications: No apparent anesthesia complications

## 2014-12-06 ENCOUNTER — Encounter (HOSPITAL_COMMUNITY): Payer: Self-pay | Admitting: Vascular Surgery

## 2014-12-06 DIAGNOSIS — Z89612 Acquired absence of left leg above knee: Secondary | ICD-10-CM

## 2014-12-06 DIAGNOSIS — D62 Acute posthemorrhagic anemia: Secondary | ICD-10-CM

## 2014-12-06 LAB — BASIC METABOLIC PANEL
Anion gap: 8 (ref 5–15)
BUN: 16 mg/dL (ref 6–23)
CHLORIDE: 93 mmol/L — AB (ref 96–112)
CO2: 33 mmol/L — ABNORMAL HIGH (ref 19–32)
Calcium: 8.3 mg/dL — ABNORMAL LOW (ref 8.4–10.5)
Creatinine, Ser: 1.02 mg/dL (ref 0.50–1.35)
GFR calc Af Amer: 83 mL/min — ABNORMAL LOW (ref >90)
GFR calc non Af Amer: 71 mL/min — ABNORMAL LOW (ref >90)
Glucose, Bld: 134 mg/dL — ABNORMAL HIGH (ref 70–99)
Potassium: 3.1 mmol/L — ABNORMAL LOW (ref 3.5–5.1)
SODIUM: 134 mmol/L — AB (ref 135–145)

## 2014-12-06 LAB — CBC
HEMATOCRIT: 20.5 % — AB (ref 39.0–52.0)
Hemoglobin: 6.6 g/dL — CL (ref 13.0–17.0)
MCH: 28.4 pg (ref 26.0–34.0)
MCHC: 32.2 g/dL (ref 30.0–36.0)
MCV: 88.4 fL (ref 78.0–100.0)
Platelets: 452 10*3/uL — ABNORMAL HIGH (ref 150–400)
RBC: 2.32 MIL/uL — ABNORMAL LOW (ref 4.22–5.81)
RDW: 14.6 % (ref 11.5–15.5)
WBC: 9.9 10*3/uL (ref 4.0–10.5)

## 2014-12-06 LAB — GLUCOSE, CAPILLARY
GLUCOSE-CAPILLARY: 105 mg/dL — AB (ref 70–99)
Glucose-Capillary: 152 mg/dL — ABNORMAL HIGH (ref 70–99)
Glucose-Capillary: 168 mg/dL — ABNORMAL HIGH (ref 70–99)

## 2014-12-06 LAB — PREPARE RBC (CROSSMATCH)

## 2014-12-06 LAB — MAGNESIUM: MAGNESIUM: 1.4 mg/dL — AB (ref 1.5–2.5)

## 2014-12-06 LAB — HEMOGLOBIN AND HEMATOCRIT, BLOOD
HCT: 22.8 % — ABNORMAL LOW (ref 39.0–52.0)
Hemoglobin: 7.5 g/dL — ABNORMAL LOW (ref 13.0–17.0)

## 2014-12-06 MED ORDER — PIPERACILLIN-TAZOBACTAM 3.375 G IVPB
3.3750 g | Freq: Once | INTRAVENOUS | Status: AC
  Administered 2014-12-06: 3.375 g via INTRAVENOUS
  Filled 2014-12-06: qty 50

## 2014-12-06 MED ORDER — POTASSIUM CHLORIDE CRYS ER 20 MEQ PO TBCR
40.0000 meq | EXTENDED_RELEASE_TABLET | Freq: Once | ORAL | Status: AC
  Administered 2014-12-06: 40 meq via ORAL
  Filled 2014-12-06: qty 2

## 2014-12-06 MED ORDER — VANCOMYCIN HCL IN DEXTROSE 750-5 MG/150ML-% IV SOLN
750.0000 mg | Freq: Once | INTRAVENOUS | Status: AC
  Administered 2014-12-06: 750 mg via INTRAVENOUS
  Filled 2014-12-06: qty 150

## 2014-12-06 MED ORDER — POTASSIUM CHLORIDE CRYS ER 20 MEQ PO TBCR
20.0000 meq | EXTENDED_RELEASE_TABLET | Freq: Every day | ORAL | Status: DC
  Administered 2014-12-07 – 2014-12-09 (×3): 20 meq via ORAL
  Filled 2014-12-06 (×3): qty 1

## 2014-12-06 MED ORDER — SODIUM CHLORIDE 0.9 % IV SOLN
Freq: Once | INTRAVENOUS | Status: AC
  Administered 2014-12-06: 05:00:00 via INTRAVENOUS

## 2014-12-06 NOTE — Progress Notes (Signed)
CRITICAL VALUE ALERT  Critical value received:  Hgb 6.6  Date of notification:  12/06/14  Time of notification:  7034  Critical value read back:Yes.    Nurse who received alert:  D. Aleene Davidson, RN  MD notified (1st page):  Lamar Blinks, NP  Time of first page:  [phone]  MD notified (2nd page):  Time of second page:  Responding MD:  Lamar Blinks, NP  Time MD responded:  4818  New orders received.

## 2014-12-06 NOTE — Progress Notes (Addendum)
  Progress Note    12/06/2014 7:41 AM 1 Day Post-Op  Subjective:  States he is having pain in his left leg  Tm 99.1 now afebrile HR 90's-100's NSR/ST 008'Q-761'P systolic 50% [id number]  ABx: Vanc Zosyn  Filed Vitals:   12/06/14 0700  BP:   Pulse:   Temp: 98.2 F (36.8 C)  Resp:     Physical Exam: Incisions:  Left AKA site with bandage intact and clean   CBC    Component Value Date/Time   WBC 9.9 12/06/2014 0230   RBC 2.32* 12/06/2014 0230   HGB 6.6* 12/06/2014 0230   HCT 20.5* 12/06/2014 0230   PLT 452* 12/06/2014 0230   MCV 88.4 12/06/2014 0230   MCH 28.4 12/06/2014 0230   MCHC 32.2 12/06/2014 0230   RDW 14.6 12/06/2014 0230   LYMPHSABS 1.5 12/03/2014 1710   MONOABS 1.1* 12/03/2014 1710   EOSABS 0.1 12/03/2014 1710   BASOSABS 0.0 12/03/2014 1710    BMET    Component Value Date/Time   NA 134* 12/06/2014 0230   K 3.1* 12/06/2014 0230   CL 93* 12/06/2014 0230   CO2 33* 12/06/2014 0230   GLUCOSE 134* 12/06/2014 0230   BUN 16 12/06/2014 0230   CREATININE 1.02 12/06/2014 0230   CALCIUM 8.3* 12/06/2014 0230   GFRNONAA 71* 12/06/2014 0230   GFRAA 83* 12/06/2014 0230    INR    Component Value Date/Time   INR 0.97 12/03/2014 1710     Intake/Output Summary (Last 24 hours) at 12/06/14 0741 Last data filed at 12/06/14 0700  Gross per 24 hour  Intake   2059 ml  Output   1125 ml  Net    934 ml     Assessment/Plan:  74 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 -should be able to d/c ABx after this morning's dose -will need PT consult-will not let me place this order with pt on bed rest -continue with pain control -will take bandage down tomorrow -acute surgical blood loss anemia-pt receiving PRBC's   Leontine Locket, PA-C [facility] [phone] 12/06/2014 7:41 AM  Agree with above  Change  dressing tomorrow Get out of bed  Ruta Hinds, MD [facility] [place] Office: [phone] Pager: [phone]

## 2014-12-06 NOTE — Evaluation (Signed)
Clinical/Bedside Swallow Evaluation Patient Details  Name: Stephen [name] MRN: [id number] Date of Birth: 1941/01/14  Today's Date: 12/06/2014 Time: SLP Start Time (ACUTE ONLY): 0914 SLP Stop Time (ACUTE ONLY): 0926 SLP Time Calculation (min) (ACUTE ONLY): 12 min  Past Medical History:  Past Medical History  Diagnosis Date  . Diabetes mellitus   . Hypertension   . Pancreatitis   . Neuropathy   . Hyperlipemia   . Peripheral vascular disease     a. 10/2013: severely abnormal ABIs, for OP evaluation.  [name] GERD (gastroesophageal reflux disease)   . S/P colonoscopy July 2003    hyperplastic polyp, diverticulosis, anal papilla and internal hemorrhoids r  . S/P endoscopy 2003    Dr. Braulio Bosch: erosive reflux esophagitis , PUD  . PUD (peptic ulcer disease)     diagnosed via EGD by Dr. Braulio Bosch at [facility]  . Diverticulosis   . Hemorrhoids   . Coronary artery disease      a. BMS to LAD 1999. b. HSRA to LAD and stent to RCA 2007. c. NSTEMI/CABG x 3 in 04/2010. d. NSTEMI 10/2013 - secondary to demand ischemia in setting of COPD exacerbation with severe underlying CAD - cath with 3/3 patent grafts, for medical therapy.   [name] COPD (chronic obstructive pulmonary disease)     a. Ongoing tobacco abuse.  . Arthritis   . Neutropenia     a. Dx 10/2013 on labs - instructed to f/u PCP.  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   . Ischemic cardiomyopathy     a. previously EF 30% in 2011. b. 10/2013: EF 45-50% by echo.  . Alcoholism     a. Remote alcoholism  . 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   Past Surgical History:  Past Surgical History  Procedure Laterality Date  . Coronary artery bypass graft  June 2011    X3  . Right inguinal hernia repair    . Cholecystectomy      3-4 years ago  . Appendectomy      age 18  . Cataracts    . Colonoscopy  05/12/2002    Dr. [name]-  diverticulosis, anal papilla, internal hemorrhoids, rectal polyps  . [name] dilation  04/16/2012    Schatzki's ring-status post dilation and disruption/ as described above. Grade 1 esophageal varices. Small hiatal hernia.  [name] dilation  04/16/2012    Procedure: [name] DILATION;  Surgeon: Daneil Dolin, MD;  Location: [facility];  Service: Endoscopy;  Laterality: N/A;  . Colonoscopy  08/10/2012    Procedure: COLONOSCOPY;  Surgeon: Daneil Dolin, MD;  Location: [facility];  Service: Endoscopy;  Laterality: N/A;  9:45 needs 30 mins extra /have Glucagon on hand  . Left heart catheterization with coronary/graft angiogram N/A 10/29/2013    Procedure: LEFT HEART CATHETERIZATION WITH [name];  Surgeon: Burnell Blanks, MD;  Location: [facility] CATH LAB;  Service: Cardiovascular;  Laterality: N/A;  . Abdominal aortagram N/A 09/09/2014    Procedure: ABDOMINAL Maxcine Ham;  Surgeon: Elam Dutch, MD;  Location: [facility] CATH LAB;  Service: Cardiovascular;  Laterality: N/A;  . Atherectomy Bilateral 09/23/2014    Procedure: ATHERECTOMY;  Surgeon: Elam Dutch, MD;  Location: [facility] CATH LAB;  Service: Cardiovascular;  Laterality: Bilateral;  iliacs   HPI:  74 y.o.male with hx of PVD, pancreatic insufficiency, DM, chronic systolic and diastolic  heart failure, COPD, admitted with sepsis secondary to LLE celllulitis/honhealing ulcers.  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  Extubated s/p surgery without complications.  Confusion with concerns for aspiration, hence swallow eval ordered.    Assessment / Plan / Recommendation Clinical Impression  Pt presents with normal biomechanical swallow with active mastication, consistent swallow response, and no s/s of aspiration.  Primary barrier to safe eating is lethargy.  Recommend Dysphagia 3 diet/chopped meat, thin liquids (pt states he wears dentures but not present in room); meds whole with puree for now.  Please provide full supervision with meals until MS  allows for him to eat safely and independently.  No further SLP needs identified - will sign off.     Aspiration Risk  Mild    Diet Recommendation Dysphagia 3 (Mechanical Soft);Thin liquid   Liquid Administration via: Cup;Straw Medication Administration: Whole meds with puree Supervision: Full supervision/cueing for compensatory strategies    Other  Recommendations Oral Care Recommendations: Oral care BID   Follow Up Recommendations  None        Swallow Study Prior Functional Status       General Date of Onset: 12/03/14 HPI: 74 y.o.male with hx of PVD, pancreatic insufficiency, DM, chronic systolic and diastolic heart failure, COPD, admitted with sepsis secondary to LLE celllulitis/honhealing ulcers.  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  Extubated s/p surgery without complications.  Confusion with concerns for aspiration, hence swallow eval ordered.  Type of Study: Bedside swallow evaluation Previous Swallow Assessment: none per records Diet Prior to this Study: NPO Temperature Spikes Noted: No Respiratory Status: Nasal cannula History of Recent Intubation: Yes Length of Intubations (days): 1 days Date extubated: 12/05/14 Behavior/Cognition: Alert (sleepy) Oral Cavity - Dentition: Edentulous Self-Feeding Abilities: Needs assist Patient Positioning: Upright in bed Baseline Vocal Quality: Clear Volitional Cough: Strong Volitional Swallow: Able to elicit    Oral/Motor/Sensory Function Overall Oral Motor/Sensory Function: Appears within functional limits for tasks assessed   Ice Chips Ice chips: Within functional limits Presentation: Spoon   Thin Liquid Thin Liquid: Within functional limits Presentation: Cup;Straw    Nectar Thick Nectar Thick Liquid: Not tested   Honey Thick Honey Thick Liquid: Not tested   Puree Puree: Within functional limits Presentation: Self Fed;Spoon   Solid  Delayna Sparlin L. [place], [place] CCC/SLP Pager [phone]     Solid: Not tested       Juan Quam  Laurice 12/06/2014,9:33 AM

## 2014-12-06 NOTE — Progress Notes (Signed)
OT Cancellation Note  Patient Details Name: DRELYN PISTILLI MRN: [id number] DOB: 11-Apr-1941   Cancelled Treatment:    Reason Eval/Treat Not Completed: Fatigue/lethargy limiting ability to participate. OT spoke with pt briefly and he fell back asleep with OT standing there. Will re-attempt as time allows.  Benito Mccreedy OTR/L [phone] 12/06/2014, 11:13 AM

## 2014-12-06 NOTE — Progress Notes (Signed)
Rehab admissions - I met with pt in follow up to rehab MD consult to explain the possibility of inpatient rehab. Pt was sleepy in chair, in mitts and lap restraint belt with no family present. Pt was not able to answer baseline questions. Informational brochures were left in pt's room.  I then called pt's wife. Wife answered the phone but preferred that I speak with pt's grand-daughter as pt's wife stated, "I can't really hear. She will tell me what's going on." Grand-daughter named Shaunta Laurance Flatten said she has been helping her grandparents and will be able to provide needed care for pt if needed. In addition, Shaunta shared that pt's son Vicente Males will also be an available caregiver. [name] is somewhat limited in her physical ability to assist pt. They are all in agreement to pursue inpatient rehab.  I explained that pt has [facility] Medicare and we will need insurance authorization for a possible inpatient rehab stay.  I will now open pt's case with [facility] Medicare. I will check on pt's status tomorrow and will keep the pt/family and medical team aware of any updates.  Please call me with any questions. Thanks.  Nanetta Batty, PT Rehabilitation Admissions Coordinator [phone]

## 2014-12-06 NOTE — Progress Notes (Signed)
[facility]                                                                              Patient Demographics  Stephen [name], is a 74 y.o. male, DOB - [birth year], CLE:[id number]  Admit date - 12/03/2014   Admitting Physician Sid Falcon, MD  Outpatient Primary MD for the patient is Reynold Bowen, NT  LOS - 3   Chief Complaint  Patient presents with  . Leg Swelling      HPI on 12/03/2014 by Dr. Linna Darner Stephen [name] is a 74 y.o. Male.Leg pain: L. Getting worse. Located in L ankle w/o radiation. Open sore and w/ purulent discharge. Open wound on ankle present for 8-9 mo. Associated w/ generalized body aches. Deneis fevers, SOB, palpitations. Hydrocodone w/o benefit in ankle. Pain is worse w/ movement.  CP a couple nights ago that was relieved w/ "CP pill." but pt unsure of what medicine it was he took.  CBG at home range from 400-500 routinely  Interim History Patient status post left AKA. Vascular surgery following. PTOT currently pending.  Assessment & Plan   Peripheral Vascular Disease -patient has had stenting of the bilateral iliac systems for ulcer -Healed ulcers on right lower extremity -Nonhealing open wounds on Left lower ext.   -Vascular surgery consulted and appreciated, patient is s/p Left AKA -Was initially placed on vancomycin and zosyn- but can now be discontinued -Will consult PT/OT -PMR consulted  Anemia secondary to Acute Blood Loss -Hb 6.6, 1uPRBCs transfused -Will like require an additional 2 units, however given his CHF will replace carefully -Continue to monitor CBC  Elevated troponin -Patient had chest pain a few days ago, currently chest pain free -Cardiology consulted and appreciated and does not feel this is secondary to ACS.  Pending echocardiogram results, if no change, discontinue heparin -Echocardiogram shows EF 30-35%, spoke with cardiology will discontinue heparin at this time -Continue aspirin and plavix, statin, metoprolol,  lisinopril  Sepsis secondary to LLE cellulitis/nonhealing uclers -Improving, Treatment and plan as above -Leukocytosis and fever resolved  Chronic systolic and diastolic heart failure -Echocardiogram December 2014 shows EF of 94-49%, grade 1 diastolic dysfunction -Continue home regimen of Lasix, metolazone, spironolactone, metoprolol, lisinopril -Monitor daily weights, intake and output -Repeat Echocardiogram: EF30-35%, moderate diffuse hypokinesis  Diabetes mellitus, type II -Hemoglobin A1c 6.6 -Continue Lantus, insulin sliding scale, CBG monitoring -Metformin and glipizide held  Hypertension -Continue home regimen: Metoprolol, lisinopril, diuretics  Pancreatic insufficiency -Continue creon  COPD -Currently compensated, no wheezing -Continue Symbicort, Spiriva  Neuropathic pain -Continue Neurontin  Hyperlipidemia -Continue statin  Tobacco abuse -Patient counseled continue nicotine patch  Code Status: DNR  Family Communication: None at bedside  Disposition Plan: Admitted, pending PT/OT consults  Time Spent in minutes   30 minutes  Procedures  Echocardiogram Left AKA  Consults   Cardiology Vascular Surgery PMR  DVT Prophylaxis  SCDs (RLE)  Lab Results  Component Value Date   PLT 452* 12/06/2014    Medications  Scheduled Meds: . antiseptic oral rinse  7 mL Mouth Rinse BID  . aspirin  325 mg Oral Daily  . atorvastatin  40 mg Oral Daily  . clopidogrel  75 mg Oral Daily  . docusate sodium  100 mg Oral Daily  . furosemide  40 mg Oral Daily  . gabapentin  300 mg Oral QHS  . insulin aspart  0-15 Units Subcutaneous TID WC  . isosorbide mononitrate  30 mg Oral Daily  . lipase/protease/amylase  72,000 Units Oral TID WC  . lisinopril  40 mg Oral Daily  . metolazone  2.5 mg Oral Daily  . metoprolol tartrate  25 mg Oral BID  . nicotine  14 mg Transdermal Daily  . pantoprazole  40 mg Oral Daily  . piperacillin-tazobactam (ZOSYN)  IV  3.375 g Intravenous  Q8H  . sodium chloride  3 mL Intravenous Q12H  . spironolactone  25 mg Oral Daily  . tiotropium  18 mcg Inhalation Daily  . vancomycin  750 mg Intravenous Q12H   Continuous Infusions: . sodium chloride 75 mL/hr at 12/05/14 1900  . heparin 1,700 Units/hr (12/06/14 0700)   PRN Meds:.acetaminophen **OR** acetaminophen, budesonide-formoterol, levalbuterol, lipase/protease/amylase, morphine injection, ondansetron **OR** ondansetron (ZOFRAN) IV  Antibiotics    Anti-infectives    Start     Dose/Rate Route Frequency Ordered Stop   12/04/14 1200  vancomycin (VANCOCIN) IVPB 750 mg/150 ml premix     750 mg150 mL/hr over 60 Minutes Intravenous Every 12 hours 12/03/14 2216     12/04/14 0400  piperacillin-tazobactam (ZOSYN) IVPB 3.375 g     3.375 g12.5 mL/hr over 240 Minutes Intravenous Every 8 hours 12/03/14 2216     12/03/14 2100  piperacillin-tazobactam (ZOSYN) IVPB 3.375 g     3.375 g100 mL/hr over 30 Minutes Intravenous  Once 12/03/14 2024 12/04/14 0011   12/03/14 2030  vancomycin (VANCOCIN) 1,500 mg in sodium chloride 0.9 % 500 mL IVPB     1,500 mg250 mL/hr over 120 Minutes Intravenous  Once 12/03/14 2024 12/04/14 0200        Subjective:   Stephen [name] seen and examined today. Complains of left leg pain.  Denies chest pain or shortness of breath.  Objective:   Filed Vitals:   12/06/14 0540 12/06/14 0600 12/06/14 0700 12/06/14 0933  BP: 121/45 124/57  124/56  Pulse: 105 108  107  Temp: 98.3 F (36.8 C) 98.4 F (36.9 C) 98.2 F (36.8 C) 97.8 F (36.6 C)  TempSrc: Axillary Axillary Axillary Oral  Resp: 14 16  17   Height:      Weight:      SpO2: 96% 98%  95%    Wt Readings from Last 3 Encounters:  12/06/14 71.9 kg (158 lb 8.2 oz)  11/03/14 77.111 kg (170 lb)  09/23/14 77.111 kg (170 lb)     Intake/Output Summary (Last 24 hours) at 12/06/14 0959 Last data filed at 12/06/14 0930  Gross per 24 hour  Intake   2302 ml  Output    675 ml  Net   1627 ml     Exam  General: Well developed, malnourished appears stated age  89: NCAT, mucous membranes moist.   Cardiovascular: S1 S2 auscultated, no rubs, murmurs or gallops. Regular rate and rhythm.  Respiratory: Diminished breath sounds, rhonchi lung bases  Abdomen: Soft, nontender, nondistended, + bowel sounds  Extremities: Left AKA- with bandage  Neuro: AAOx3, nonfocal, sleepy  Data Review   Micro Results Recent Results (from the past 240 hour(s))  MRSA PCR Screening     Status: None   Collection Time: 12/03/14  9:31 PM  Result Value Ref Range Status   MRSA by PCR NEGATIVE NEGATIVE Final  Comment:        The GeneXpert MRSA Assay (FDA approved for NASAL specimens only), is one component of a comprehensive MRSA colonization surveillance program. It is not intended to diagnose MRSA infection nor to guide or monitor treatment for MRSA infections.     Radiology Reports Dg Chest 1 View  12/03/2014   CLINICAL DATA:  Bilateral lower extremity swelling.  EXAM: CHEST - 1 VIEW  COMPARISON:  PA and lateral chest 10/05/2014 the hand 10/23/2013.  FINDINGS: Heart size is normal. The patient is status post CABG. Lungs are clear. No pneumothorax or pleural effusion.  IMPRESSION: No acute disease.   Electronically Signed   By: Inge Rise M.D.   On: 12/03/2014 17:47   Dg Tibia/fibula Left  12/03/2014   CLINICAL DATA:  Bilateral lower extremity swelling. Wound over the medial malleolus of the left ankle.  EXAM: LEFT TIBIA AND FIBULA - 2 VIEW  COMPARISON:  None.  FINDINGS: Wound over the medial malleolus is identified. There may be an additional wound posteriorly along the left lower leg. a No acute bony or joint abnormality is seen. Atherosclerosis is noted.  IMPRESSION: Skin wounds without underlying acute bony or joint abnormality.  Atherosclerosis.   Electronically Signed   By: Inge Rise M.D.   On: 12/03/2014 17:49   Dg Ankle Complete Left  12/03/2014   CLINICAL DATA:   Bilateral leg swelling, bilateral ankle swelling, large round necrotic sore overlying medial malleolus on left ankle  EXAM: LEFT ANKLE COMPLETE - 3+ VIEW  COMPARISON:  None.  FINDINGS: No fracture.  Ankle mortise is normally spaced and aligned.  There is a soft tissue defect extending to the surface of the bone overlying the medial malleolus. No bone resorption is seen to suggest osteomyelitis. There is no soft tissue air. No radiopaque foreign body.  Mild diffuse subcutaneous soft tissue edema is noted of the lower leg and ankle.  IMPRESSION: No fracture.  No evidence of osteomyelitis.   Electronically Signed   By: Lajean Manes M.D.   On: 12/03/2014 17:47   Dg Ankle Complete Right  12/03/2014   CLINICAL DATA:  Bilateral lower extremity swelling and pain. Initial encounter.  EXAM: RIGHT ANKLE - COMPLETE 3+ VIEW  COMPARISON:  None.  FINDINGS: Soft tissue swelling is seen about the right ankle. No underlying acute bony or joint abnormality is identified. No soft tissue gas collection is seen. Atherosclerosis is noted.  IMPRESSION: Soft tissue swelling.  Otherwise negative.   Electronically Signed   By: Inge Rise M.D.   On: 12/03/2014 17:50    CBC  Recent Labs Lab 12/03/14 1710 12/04/14 0418 12/05/14 0015 12/06/14 0230  WBC 15.3* 13.7* 15.2* 9.9  HGB 9.7* 9.0* 7.7* 6.6*  HCT 29.5* 28.7* 24.8* 20.5*  PLT 548* 576* 537* 452*  MCV 90.5 90.5 90.2 88.4  MCH 29.8 28.4 28.0 28.4  MCHC 32.9 31.4 31.0 32.2  RDW 14.5 14.6 14.9 14.6  LYMPHSABS 1.5  --   --   --   MONOABS 1.1*  --   --   --   EOSABS 0.1  --   --   --   BASOSABS 0.0  --   --   --     Chemistries   Recent Labs Lab 12/03/14 1710 12/04/14 0418 12/05/14 0015 12/06/14 0230  NA 132* 134* 131* 134*  K 4.3 3.8 3.7 3.1*  CL 90* 93* 90* 93*  CO2 32 31 31 33*  GLUCOSE 250* 160* 178* 134*  BUN 31* 23 22 16   CREATININE 1.30 0.99 1.06 1.02  CALCIUM 8.8 8.8 8.8 8.3*  AST  --  13  --   --   ALT  --  9  --   --   ALKPHOS  --  79   --   --   BILITOT  --  0.4  --   --    ------------------------------------------------------------------------------------------------------------------ estimated creatinine clearance is 66.6 mL/min (by C-G formula based on Cr of 1.02). ------------------------------------------------------------------------------------------------------------------  Recent Labs  12/04/14 0418  HGBA1C 6.6*   ------------------------------------------------------------------------------------------------------------------ No results for input(s): CHOL, HDL, LDLCALC, TRIG, CHOLHDL, LDLDIRECT in the last 72 hours. ------------------------------------------------------------------------------------------------------------------ No results for input(s): TSH, T4TOTAL, T3FREE, THYROIDAB in the last 72 hours.  Invalid input(s): FREET3 ------------------------------------------------------------------------------------------------------------------ No results for input(s): VITAMINB12, FOLATE, FERRITIN, TIBC, IRON, RETICCTPCT in the last 72 hours.  Coagulation profile  Recent Labs Lab 12/03/14 1710  INR 0.97    No results for input(s): DDIMER in the last 72 hours.  Cardiac Enzymes  Recent Labs Lab 12/04/14 0418 12/04/14 1010 12/04/14 1538  TROPONINI 0.15* 0.10* 0.08*   ------------------------------------------------------------------------------------------------------------------ Invalid input(s): POCBNP    Millissa Deese D.O. on 12/06/2014 at 9:59 AM  Between 7am to 7pm - Pager - [phone]  After 7pm go to [email or web address] - password [email or web address]  And look for the night coverage person covering for me after hours  [facility] Office  [phone]

## 2014-12-06 NOTE — Progress Notes (Signed)
Patient: Stephen [name] / Admit Date: 12/03/2014 / Date of Encounter: 12/06/2014, 11:39 AM   Subjective: Denies pain, SOB.   Objective: Telemetry: sinus tach, occ PVCs, occ vent trigeminy Physical Exam: Blood pressure 124/56, pulse 107, temperature 98.4 F (36.9 C), temperature source Oral, resp. rate 17, height 5\' 10"  (1.778 m), weight 158 lb 8.2 oz (71.9 kg), SpO2 95 %. General: Well developed sallow complexion WM, in no acute distress Head: Normocephalic, atraumatic, sclera non-icteric, no xanthomas, nares are without discharge. Neck: JVP not elevated. Lungs: Rhonchorous throughout. No wheezing. Moderate air movement. Breathing is unlabored. Heart: RRR slightly elevated rate S1 S2 without murmurs, rubs, or gallops.  Abdomen: Soft, non-tender, non-distended with normoactive bowel sounds. No rebound/guarding. Extremities: No clubbing or cyanosis. No edema. S/p wrapped L AKA. Neuro: Oriented to self, doesn't really answer what year it is. Took a long time to answer location but finally said he is at "[place]." Follows commands. Slowed affect.   Intake/Output Summary (Last 24 hours) at 12/06/14 1139 Last data filed at 12/06/14 1100  Gross per 24 hour  Intake   2302 ml  Output    700 ml  Net   1602 ml    Inpatient Medications:  . antiseptic oral rinse  7 mL Mouth Rinse BID  . aspirin  325 mg Oral Daily  . atorvastatin  40 mg Oral Daily  . clopidogrel  75 mg Oral Daily  . docusate sodium  100 mg Oral Daily  . furosemide  40 mg Oral Daily  . gabapentin  300 mg Oral QHS  . insulin aspart  0-15 Units Subcutaneous TID WC  . isosorbide mononitrate  30 mg Oral Daily  . lipase/protease/amylase  72,000 Units Oral TID WC  . lisinopril  40 mg Oral Daily  . metolazone  2.5 mg Oral Daily  . metoprolol tartrate  25 mg Oral BID  . nicotine  14 mg Transdermal Daily  . pantoprazole  40 mg Oral Daily  . piperacillin-tazobactam (ZOSYN)  IV  3.375 g Intravenous Once  . sodium chloride  3 mL  Intravenous Q12H  . spironolactone  25 mg Oral Daily  . tiotropium  18 mcg Inhalation Daily  . vancomycin  750 mg Intravenous Once   Infusions:  . sodium chloride 75 mL/hr at 12/05/14 1900    Labs:  Recent Labs  12/05/14 0015 12/06/14 0230  NA 131* 134*  K 3.7 3.1*  CL 90* 93*  CO2 31 33*  GLUCOSE 178* 134*  BUN 22 16  CREATININE 1.06 1.02  CALCIUM 8.8 8.3*    Recent Labs  12/04/14 0418  AST 13  ALT 9  ALKPHOS 79  BILITOT 0.4  PROT 6.2  ALBUMIN 2.0*    Recent Labs  12/03/14 1710  12/05/14 0015 12/06/14 0230  WBC 15.3*  < > 15.2* 9.9  NEUTROABS 12.6*  --   --   --   HGB 9.7*  < > 7.7* 6.6*  HCT 29.5*  < > 24.8* 20.5*  MCV 90.5  < > 90.2 88.4  PLT 548*  < > 537* 452*  < > = values in this interval not displayed.  Recent Labs  12/03/14 1710 12/04/14 0418 12/04/14 1010 12/04/14 1538  TROPONINI 0.09* 0.15* 0.10* 0.08*   Invalid input(s): POCBNP  Recent Labs  12/04/14 0418  HGBA1C 6.6*     Radiology/Studies:  Dg Chest 1 View  12/03/2014   CLINICAL DATA:  Bilateral lower extremity swelling.  EXAM: CHEST - 1 VIEW  COMPARISON:  PA and lateral chest 10/05/2014 the hand 10/23/2013.  FINDINGS: Heart size is normal. The patient is status post CABG. Lungs are clear. No pneumothorax or pleural effusion.  IMPRESSION: No acute disease.   Electronically Signed   By: Inge Rise M.D.   On: 12/03/2014 17:47   Dg Tibia/fibula Left  12/03/2014   CLINICAL DATA:  Bilateral lower extremity swelling. Wound over the medial malleolus of the left ankle.  EXAM: LEFT TIBIA AND FIBULA - 2 VIEW  COMPARISON:  None.  FINDINGS: Wound over the medial malleolus is identified. There may be an additional wound posteriorly along the left lower leg. a No acute bony or joint abnormality is seen. Atherosclerosis is noted.  IMPRESSION: Skin wounds without underlying acute bony or joint abnormality.  Atherosclerosis.   Electronically Signed   By: Inge Rise M.D.   On: 12/03/2014  17:49   Dg Ankle Complete Left  12/03/2014   CLINICAL DATA:  Bilateral leg swelling, bilateral ankle swelling, large round necrotic sore overlying medial malleolus on left ankle  EXAM: LEFT ANKLE COMPLETE - 3+ VIEW  COMPARISON:  None.  FINDINGS: No fracture.  Ankle mortise is normally spaced and aligned.  There is a soft tissue defect extending to the surface of the bone overlying the medial malleolus. No bone resorption is seen to suggest osteomyelitis. There is no soft tissue air. No radiopaque foreign body.  Mild diffuse subcutaneous soft tissue edema is noted of the lower leg and ankle.  IMPRESSION: No fracture.  No evidence of osteomyelitis.   Electronically Signed   By: Lajean Manes M.D.   On: 12/03/2014 17:47   Dg Ankle Complete Right  12/03/2014   CLINICAL DATA:  Bilateral lower extremity swelling and pain. Initial encounter.  EXAM: RIGHT ANKLE - COMPLETE 3+ VIEW  COMPARISON:  None.  FINDINGS: Soft tissue swelling is seen about the right ankle. No underlying acute bony or joint abnormality is identified. No soft tissue gas collection is seen. Atherosclerosis is noted.  IMPRESSION: Soft tissue swelling.  Otherwise negative.   Electronically Signed   By: Inge Rise M.D.   On: 12/03/2014 17:50     Assessment and Plan  1. 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 CAD s/p PCI, CABG 2011 with mildly elevated troponin 3. Ischemic cardiomyopathy/chronic systolic CHF - EF 76-16% 4. Acute on chronic anemia 5. Poor functional baseline 6. HTN 7. HLD 8. Diabetes mellitus 9. Confusion 10. PVCs  Troponin elevation is nonspecific in setting of above. He has not had any chest pain recently. 2D echo 12/05/14: mod-severely reduced LV function, EF 30-35%, moderate diffuse HK with severe HK of inferior MK, mild-mod MR. EF down from prior echo (EF by echo 45-50% in 10/2013 but EF by cath in 10/2013 was 40% with 3/3 patent bypass grafts - cath done at that time for + troponin  in setting of COPD exacerbation). Per discussion with Dr. Irish Lack, do not anticipate further invasive cardiac eval at present time. Marginal elevation in troponin is not consistent with acute coronary syndrome, although it does portend poor overall cardiovascular prognosis. If he develops ischemic cardiac symptoms we could revisit this but for now will continue medical therapy with ASA, BB, statin, Imdur, ACEI. With concomitant Plavix would consider dropping ASA to 81mg  daily although will defer to vascular team.  Weight is down 8 lbs since admission, I/O net +3.8L. Give 36meq KCl today and start 66meq daily (is on spironolactone, more recent  K 3.7-4.3 on this regimen). Add Mg level. Metolazone resumed today (missed dose yesterday). Agree with transfusion to keep Hgb afloat given underlying CAD and recently positive troponin - watch volume status carefully with this. Will discuss possible additional afternoon dose of Lasix with MD given blood transfusion (if we do, will need another dose of KCl). Sinus tach appropriate for degree of anemia.  Signed, Melina Copa PA-C  I have examined the patient and reviewed assessment and plan and discussed with patient.  Agree with above as stated.  Agree with additional lasix and potassium if RBCs given.   Lucion Dilger S.

## 2014-12-06 NOTE — Evaluation (Signed)
Physical Therapy Evaluation Patient Details Name: Stephen [name] MRN: [id number] DOB: 1941/08/04 Today's Date: 12/06/2014   History of Present Illness  pt with L AKA and hx of deconditioning PTA.    Clinical Impression  Pt more alert once in sitting position and participates well with mobility.  Pt A with coming to stand, but R LE buckled with prolonged standing and required total A to prevent fall and complete transfer to recliner.  Feel pt would benefit from CIR at D/C to maximize independence prior to returning to home.  Will continue to follow.      Follow Up Recommendations CIR    Equipment Recommendations   (TBD)    Recommendations for Other Services Rehab consult     Precautions / Restrictions Precautions Precautions: Fall Restrictions Weight Bearing Restrictions: No      Mobility  Bed Mobility Overal bed mobility: Needs Assistance Bed Mobility: Supine to Sit     Supine to sit: Mod assist;HOB elevated     General bed mobility comments: cues for arousal and A with bringing trunk up to sitting and hips to EOB.    Transfers Overall transfer level: Needs assistance Equipment used: 2 person hand held assist Transfers: Sit to/from [employer] Sit to Stand: Mod assist;+2 physical assistance   Squat pivot transfers: Total assist;+2 physical assistance     General transfer comment: pt able to A with coming to stand, but needs facilitation for upright posture as pt remains somewhat flexed.  During pivot to recliner pt's R LE buckled requiring total A to complete transfer and prevent fall.    Ambulation/Gait                Stairs            Wheelchair Mobility    Modified Rankin (Stroke Patients Only)       Balance Overall balance assessment: Needs assistance Sitting-balance support: Single extremity supported;Feet supported Sitting balance-Leahy Scale: Poor Sitting balance - Comments: pt able to A with scooting to EOB with UE  support and needs at least single UE to maintain sitting balance.     Standing balance support: During functional activity Standing balance-Leahy Scale: Zero                               Pertinent Vitals/Pain Pain Assessment: Faces Faces Pain Scale: Hurts little more Pain Location: L residual limb Pain Intervention(s): Monitored during session;Premedicated before session;Repositioned    Home Living Family/patient expects to be discharged to:: Inpatient rehab Living Arrangements: Spouse/significant other               Additional Comments: pt indicates his spouse is disabled.      Prior Function Level of Independence: Independent with assistive device(s)         Comments: pt only ambulates short distances, otherwise used W/C for mobility.       Hand Dominance        Extremity/Trunk Assessment   Upper Extremity Assessment: Defer to OT evaluation           Lower Extremity Assessment: RLE deficits/detail;LLE deficits/detail RLE Deficits / Details: Grossly weak 3+/5.   LLE Deficits / Details: New L AKA.    Cervical / Trunk Assessment: Normal  Communication   Communication: No difficulties  Cognition Arousal/Alertness: Lethargic;Suspect due to medications Behavior During Therapy: Flat affect Overall Cognitive Status: Impaired/Different from baseline Area of Impairment: Orientation;Attention;Memory;Following commands;Safety/judgement;Awareness;Problem solving Orientation Level:  Disoriented to;Place;Time Current Attention Level: Sustained Memory: Decreased short-term memory Following Commands: Follows one step commands with increased time Safety/Judgement: Decreased awareness of safety;Decreased awareness of deficits Awareness: Intellectual Problem Solving: Slow processing;Decreased initiation;Difficulty sequencing;Requires verbal cues;Requires tactile cues General Comments: Once pt oriented he was able to retain that he was in [facility] and  then was able to state he had surgery on his leg.      General Comments      Exercises        Assessment/Plan    PT Assessment Patient needs continued PT services  PT Diagnosis Difficulty walking;Generalized weakness   PT Problem List Decreased strength;Decreased activity tolerance;Decreased balance;Decreased mobility;Decreased knowledge of use of DME;Decreased cognition;Decreased safety awareness  PT Treatment Interventions DME instruction;Gait training;Functional mobility training;Therapeutic activities;Therapeutic exercise;Balance training;Cognitive remediation;Patient/family education   PT Goals (Current goals can be found in the Care Plan section) Acute Rehab PT Goals Patient Stated Goal: Get up PT Goal Formulation: With patient Time For Goal Achievement: 12/20/14 Potential to Achieve Goals: Good    Frequency Min 3X/week   Barriers to discharge        Co-evaluation               End of Session Equipment Utilized During Treatment: Gait belt;Oxygen Activity Tolerance: Patient limited by lethargy Patient left: in chair;with call bell/phone within reach;with restraints reapplied Nurse Communication: Mobility status         Time: 4920-1007 PT Time Calculation (min) (ACUTE ONLY): 19 min   Charges:   PT Evaluation $Initial PT Evaluation Tier I: 1 Procedure     PT G CodesCatarina Hartshorn, [place] 12/06/2014, 10:26 AM

## 2014-12-06 NOTE — Clinical Documentation Improvement (Addendum)
MD's, NP's, and PA's   Several notes documenting patient as being "confused", episode of pulling out  IV, found on floor x1,  with the use of  mittens, and restraints prior to surgery, now also requiring Ativan.  Please document clinical diagnosis if appropriate. Thank you  . Document the etiology of the altered mental status as:  Drug -induced Confusion  Drug- induced Delirium  --Encephalopathy (types)  Anoxic/hypoxic  Drug-induced or /toxic (specify drug)  Metabolic or septic  Other (specify)  Cannot determine    Treatment: Surgery for non healing ulcers, cellulitus, IV antibiotics ,     Thank You,

## 2014-12-06 NOTE — Consult Note (Signed)
Physical Medicine and Rehabilitation Consult Reason for Consult: Left AKA Referring Physician: [facility]   HPI: Stephen [name] is a 74 y.o. right handed male with history of severe COPD, pancreatitis, hypertension, diabetes mellitus and peripheral neuropathy, CAD/cardiomyopathy with CABG, peripheral vascular disease with stenting. Presented 12/03/2014 with left ankle ulcer open wound 8-9 months and followed by vascular surgery. 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 [name]. Hospital course pain management. Bouts of confusion and agitation requiring Ativan. Patient remains on aspirin and Plavix therapy for long complicated cardiac history. Acute blood loss anemia 6.6 and plan transfusion. Physical and occupational therapy evaluations are pending. M.D. has requested physical medicine rehabilitation consult.   Review of Systems  Cardiovascular: Positive for palpitations and leg swelling.  Gastrointestinal:       GERD  All other systems reviewed and are negative.  Past Medical History  Diagnosis Date  . Diabetes mellitus   . Hypertension   . Pancreatitis   . Neuropathy   . Hyperlipemia   . Peripheral vascular disease     a. 10/2013: severely abnormal ABIs, for OP evaluation.  [name] GERD (gastroesophageal reflux disease)   . S/P colonoscopy July 2003    hyperplastic polyp, diverticulosis, anal papilla and internal hemorrhoids r  . S/P endoscopy 2003    Dr. Braulio Bosch: erosive reflux esophagitis , PUD  . PUD (peptic ulcer disease)     diagnosed via EGD by Dr. Braulio Bosch at [facility]  . Diverticulosis   . Hemorrhoids   . Coronary artery disease      a. BMS to LAD 1999. b. HSRA to LAD and stent to RCA 2007. c. NSTEMI/CABG x 3 in 04/2010. d. NSTEMI 10/2013 - secondary to demand ischemia in setting of COPD exacerbation with severe underlying CAD - cath with 3/3 patent grafts, for medical therapy.   [name] COPD  (chronic obstructive pulmonary disease)     a. Ongoing tobacco abuse.  . Arthritis   . Neutropenia     a. Dx 10/2013 on labs - instructed to f/u PCP.  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   . Ischemic cardiomyopathy     a. previously EF 30% in 2011. b. 10/2013: EF 45-50% by echo.  . Alcoholism     a. Remote alcoholism  . 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   Past Surgical History  Procedure Laterality Date  . Coronary artery bypass graft  June 2011    X3  . Right inguinal hernia repair    . Cholecystectomy      3-4 years ago  . Appendectomy      age 55  . Cataracts    . Colonoscopy  05/12/2002    Dr. [name]- diverticulosis, anal papilla, internal hemorrhoids, rectal polyps  . [name] dilation  04/16/2012    Schatzki's ring-status post dilation and disruption/ as described above. Grade 1 esophageal varices. Small hiatal hernia.  [name] dilation  04/16/2012    Procedure: [name] DILATION;  Surgeon: Daneil Dolin, MD;  Location: [facility];  Service: Endoscopy;  Laterality: N/A;  . Colonoscopy  08/10/2012    Procedure: COLONOSCOPY;  Surgeon: Daneil Dolin, MD;  Location: [facility];  Service: Endoscopy;  Laterality: N/A;  9:45 needs 30 mins extra /have Glucagon on hand  .  Left heart catheterization with coronary/graft angiogram N/A 10/29/2013    Procedure: LEFT HEART CATHETERIZATION WITH [name];  Surgeon: Burnell Blanks, MD;  Location: [facility] CATH LAB;  Service: Cardiovascular;  Laterality: N/A;  . Abdominal aortagram N/A 09/09/2014    Procedure: ABDOMINAL Maxcine Ham;  Surgeon: Elam Dutch, MD;  Location: [facility] CATH LAB;  Service: Cardiovascular;  Laterality: N/A;  . Atherectomy Bilateral 09/23/2014    Procedure: ATHERECTOMY;  Surgeon: Elam Dutch, MD;  Location: [facility] CATH LAB;  Service: Cardiovascular;  Laterality: Bilateral;   iliacs   Family History  Problem Relation Age of Onset  . Coronary artery disease Mother   . Coronary artery disease Father   . Diabetes Sister   . Suicidality Brother   . Cirrhosis Brother   . Colon cancer Neg Hx    Social History:  reports that he has been smoking Cigarettes.  He has a 47.25 pack-year smoking history. He has never used smokeless tobacco. He reports that he does not drink alcohol or use illicit drugs. Allergies:  Allergies  Allergen Reactions  . Levaquin [Levofloxacin] Hives   Medications Prior to Admission  Medication Sig Dispense Refill  . albuterol-ipratropium (COMBIVENT) 18-103 MCG/ACT inhaler Inhale 1-2 puffs into the lungs every 6 (six) hours as needed for wheezing or shortness of breath.    [name] aspirin 325 MG tablet Take 325 mg by mouth daily.    [name] atorvastatin (LIPITOR) 40 MG tablet Take 40 mg by mouth daily.    . clopidogrel (PLAVIX) 75 MG tablet Take 75 mg by mouth daily.    [name] doxycycline (VIBRAMYCIN) 100 MG capsule Take 100 mg by mouth 2 (two) times daily.    . fenofibrate 160 MG tablet Take 160 mg by mouth daily with breakfast.     . furosemide (LASIX) 20 MG tablet Take 40 mg by mouth daily.    [name] gabapentin (NEURONTIN) 100 MG capsule Take 300 mg by mouth at bedtime.     [name] glipiZIDE (GLUCOTROL XL) 2.5 MG 24 hr tablet Take 2.5 mg by mouth daily.     [name] HYDROcodone-acetaminophen (NORCO/VICODIN) 5-325 MG per tablet Take 1 tablet by mouth 4 (four) times daily as needed for moderate pain.    [name] insulin glargine (LANTUS) 100 UNIT/ML injection Inject 15-30 Units into the skin daily.     . isosorbide mononitrate (IMDUR) 30 MG 24 hr tablet Take 1 tablet (30 mg total) by mouth daily. 30 tablet 0  . levalbuterol (XOPENEX) 1.25 MG/3ML nebulizer solution Take 0.63 mg by nebulization every 4 (four) hours as needed for wheezing or shortness of breath. 72 mL 0  . lisinopril (PRINIVIL,ZESTRIL) 20 MG tablet Take 40 mg by mouth daily.     [name] loratadine (CLARITIN) 10 MG tablet  Take 10 mg by mouth daily.    . metFORMIN (GLUCOPHAGE) 1000 MG tablet Take 1,000 mg by mouth daily.    . metolazone (ZAROXOLYN) 2.5 MG tablet TAKE ONE (1) TABLET EACH DAY 30 tablet 0  . metoprolol tartrate (LOPRESSOR) 25 MG tablet Take 25 mg by mouth 2 (two) times daily.     . nitroGLYCERIN (NITROSTAT) 0.4 MG SL tablet Place 0.4 mg under the tongue every 5 (five) minutes x 3 doses as needed for chest pain.     . Pancrelipase, Lip-Prot-Amyl, (CREON) 24000 UNITS CPEP Take 48,000-72,000 Units by mouth See admin instructions. Take 72000 units 3 times daily with all meals and take 48000 units with snacks    . pantoprazole (PROTONIX) 40 MG  tablet Take 1 tablet (40 mg total) by mouth daily. 30 tablet 1  . spironolactone (ALDACTONE) 25 MG tablet Take 25 mg by mouth daily.      . SYMBICORT 160-4.5 MCG/ACT inhaler Inhale 1-2 puffs into the lungs 2 (two) times daily as needed (shorntess of breath).     . tiotropium (SPIRIVA) 18 MCG inhalation capsule Place 18 mcg into inhaler and inhale daily.      Home: Home Living Family/patient expects to be discharged to:: Private residence Living Arrangements: Spouse/significant other  Functional History:   Functional Status:  Mobility:          ADL:    Cognition: Cognition Orientation Level: Oriented to person, Disoriented to place, Disoriented to time, Disoriented to situation    Blood pressure 129/73, pulse 105, temperature 98.3 F (36.8 C), temperature source Axillary, resp. rate 17, height 5\' 10"  (1.778 m), weight 71.9 kg (158 lb 8.2 oz), SpO2 96 %. Physical Exam  HENT:  Head: Normocephalic.  Eyes: EOM are normal.  Neck: Normal range of motion. Neck supple. No thyromegaly present.  Cardiovascular: Normal rate and regular rhythm.   Respiratory: Effort normal and breath sounds normal. No respiratory distress.  GI: Soft. Bowel sounds are normal. He exhibits no distension.  Musculoskeletal: He exhibits edema.  Left thigh appropriately tender    Neurological:  Patient is lethargic but arousable. He was able to provide his name and place but would fall asleep during exam. Told me why he was here. Moves all 4's. Holding cup with assistance by SLP.   Skin:  Left AKA site is dressed     Results for orders placed or performed during the hospital encounter of 12/03/14 (from the past 24 hour(s))  Glucose, capillary     Status: Abnormal   Collection Time: 12/05/14  8:03 AM  Result Value Ref Range   Glucose-Capillary 167 (H) 70 - 99 mg/dL  Heparin level (unfractionated)     Status: None   Collection Time: 12/05/14  9:11 AM  Result Value Ref Range   Heparin Unfractionated 0.33 0.30 - 0.70 IU/mL  Glucose, capillary     Status: Abnormal   Collection Time: 12/05/14 11:32 AM  Result Value Ref Range   Glucose-Capillary 134 (H) 70 - 99 mg/dL  Type and screen     Status: None (Preliminary result)   Collection Time: 12/05/14 12:45 PM  Result Value Ref Range   ABO/RH(D) A POS    Antibody Screen NEG    Sample Expiration 12/08/2014    Unit Number [id number]    Blood Component Type RED CELLS,LR    Unit division 00    Status of Unit ALLOCATED    Transfusion Status OK TO TRANSFUSE    Crossmatch Result Compatible    Unit Number [id number]    Blood Component Type RED CELLS,LR    Unit division 00    Status of Unit ALLOCATED    Transfusion Status OK TO TRANSFUSE    Crossmatch Result Compatible    Unit Number [id number]    Blood Component Type RED CELLS,LR    Unit division 00    Status of Unit ISSUED    Transfusion Status OK TO TRANSFUSE    Crossmatch Result Compatible    Unit Number [id number]    Blood Component Type RED CELLS,LR    Unit division 00    Status of Unit ALLOCATED    Transfusion Status OK TO TRANSFUSE    Crossmatch Result Compatible   Prepare RBC (crossmatch)  Status: None   Collection Time: 12/05/14  1:42 PM  Result Value Ref Range   Order Confirmation ORDER PROCESSED BY BLOOD BANK   Glucose, capillary      Status: Abnormal   Collection Time: 12/05/14  2:36 PM  Result Value Ref Range   Glucose-Capillary 149 (H) 70 - 99 mg/dL  Glucose, capillary     Status: Abnormal   Collection Time: 12/05/14  3:39 PM  Result Value Ref Range   Glucose-Capillary 162 (H) 70 - 99 mg/dL  Glucose, capillary     Status: Abnormal   Collection Time: 12/05/14  9:30 PM  Result Value Ref Range   Glucose-Capillary 135 (H) 70 - 99 mg/dL  CBC     Status: Abnormal   Collection Time: 12/06/14  2:30 AM  Result Value Ref Range   WBC 9.9 4.0 - 10.5 K/uL   RBC 2.32 (L) 4.22 - 5.81 MIL/uL   Hemoglobin 6.6 (LL) 13.0 - 17.0 g/dL   HCT 20.5 (L) 39.0 - 52.0 %   MCV 88.4 78.0 - 100.0 fL   MCH 28.4 26.0 - 34.0 pg   MCHC 32.2 30.0 - 36.0 g/dL   RDW 14.6 11.5 - 15.5 %   Platelets 452 (H) 150 - 400 K/uL  Basic metabolic panel     Status: Abnormal   Collection Time: 12/06/14  2:30 AM  Result Value Ref Range   Sodium 134 (L) 135 - 145 mmol/L   Potassium 3.1 (L) 3.5 - 5.1 mmol/L   Chloride 93 (L) 96 - 112 mmol/L   CO2 33 (H) 19 - 32 mmol/L   Glucose, Bld 134 (H) 70 - 99 mg/dL   BUN 16 6 - 23 mg/dL   Creatinine, Ser 1.02 0.50 - 1.35 mg/dL   Calcium 8.3 (L) 8.4 - 10.5 mg/dL   GFR calc non Af Amer 71 (L) >90 mL/min   GFR calc Af Amer 83 (L) >90 mL/min   Anion gap 8 5 - 15  Prepare RBC     Status: None   Collection Time: 12/06/14  4:30 AM  Result Value Ref Range   Order Confirmation ORDER PROCESSED BY BLOOD BANK    No results found.  Assessment/Plan: Diagnosis: left AKA 1. Does the need for close, 24 hr/day medical supervision in concert with the patient's rehab needs make it unreasonable for this patient to be served in a less intensive setting? Yes 2. Co-Morbidities requiring supervision/potential complications: severe COPD, CAD, ICM/CHF,  3. Due to bladder management, bowel management, safety, skin/wound care, disease management, medication administration, pain management and patient education, does the patient  require 24 hr/day rehab nursing? Yes 4. Does the patient require coordinated care of a physician, rehab nurse, PT (1-2 hrs/day, 5 days/week) and OT (1-2 hrs/day, 5 days/week) to address physical and functional deficits in the context of the above medical diagnosis(es)? Yes Addressing deficits in the following areas: balance, endurance, locomotion, strength, transferring, bowel/bladder control, bathing, dressing, feeding, grooming, toileting and psychosocial support 5. Can the patient actively participate in an intensive therapy program of at least 3 hrs of therapy per day at least 5 days per week? Yes 6. The potential for patient to make measurable gains while on inpatient rehab is excellent 7. Anticipated functional outcomes upon discharge from inpatient rehab are supervision and min assist  with PT, supervision and min assist with OT, n/a with SLP. 8. Estimated rehab length of stay to reach the above functional goals is: likely 12-17 days 9. Does the patient  have adequate social supports and living environment to accommodate these discharge functional goals? Yes 10. Anticipated D/C setting: Home 11. Anticipated post D/C treatments: HH therapy, Outpatient therapy and Home excercise program 12. Overall Rehab/Functional Prognosis: excellent  RECOMMENDATIONS: This patient's condition is appropriate for continued rehabilitative care in the following setting: CIR Patient has agreed to participate in recommended program. Yes Note that insurance prior authorization may be required for reimbursement for recommended care.  Comment: Rehab Admissions Coordinator to follow up.  Thanks,  Meredith Staggers, MD, Mellody Drown     12/06/2014

## 2014-12-07 LAB — CBC
HCT: 22.7 % — ABNORMAL LOW (ref 39.0–52.0)
Hemoglobin: 7.4 g/dL — ABNORMAL LOW (ref 13.0–17.0)
MCH: 28.9 pg (ref 26.0–34.0)
MCHC: 32.6 g/dL (ref 30.0–36.0)
MCV: 88.7 fL (ref 78.0–100.0)
PLATELETS: 429 10*3/uL — AB (ref 150–400)
RBC: 2.56 MIL/uL — ABNORMAL LOW (ref 4.22–5.81)
RDW: 14.3 % (ref 11.5–15.5)
WBC: 8.1 10*3/uL (ref 4.0–10.5)

## 2014-12-07 LAB — MAGNESIUM: Magnesium: 1.4 mg/dL — ABNORMAL LOW (ref 1.5–2.5)

## 2014-12-07 LAB — HEMOGLOBIN AND HEMATOCRIT, BLOOD
HCT: 26.1 % — ABNORMAL LOW (ref 39.0–52.0)
Hemoglobin: 8.5 g/dL — ABNORMAL LOW (ref 13.0–17.0)

## 2014-12-07 LAB — BASIC METABOLIC PANEL
ANION GAP: 8 (ref 5–15)
BUN: 15 mg/dL (ref 6–23)
CALCIUM: 8.2 mg/dL — AB (ref 8.4–10.5)
CO2: 32 mmol/L (ref 19–32)
Chloride: 92 mmol/L — ABNORMAL LOW (ref 96–112)
Creatinine, Ser: 1.1 mg/dL (ref 0.50–1.35)
GFR calc Af Amer: 75 mL/min — ABNORMAL LOW (ref >90)
GFR calc non Af Amer: 65 mL/min — ABNORMAL LOW (ref >90)
Glucose, Bld: 166 mg/dL — ABNORMAL HIGH (ref 70–99)
Potassium: 3.9 mmol/L (ref 3.5–5.1)
Sodium: 132 mmol/L — ABNORMAL LOW (ref 135–145)

## 2014-12-07 LAB — GLUCOSE, CAPILLARY
Glucose-Capillary: 211 mg/dL — ABNORMAL HIGH (ref 70–99)
Glucose-Capillary: 251 mg/dL — ABNORMAL HIGH (ref 70–99)

## 2014-12-07 LAB — PREPARE RBC (CROSSMATCH)

## 2014-12-07 MED ORDER — FUROSEMIDE 10 MG/ML IJ SOLN
40.0000 mg | Freq: Once | INTRAMUSCULAR | Status: AC | PRN
  Administered 2014-12-07: 40 mg via INTRAVENOUS
  Filled 2014-12-07 (×2): qty 4

## 2014-12-07 MED ORDER — QUETIAPINE FUMARATE 25 MG PO TABS
25.0000 mg | ORAL_TABLET | Freq: Every day | ORAL | Status: DC
  Administered 2014-12-07 – 2014-12-08 (×2): 25 mg via ORAL
  Filled 2014-12-07 (×3): qty 1

## 2014-12-07 MED ORDER — POTASSIUM CHLORIDE CRYS ER 20 MEQ PO TBCR
20.0000 meq | EXTENDED_RELEASE_TABLET | Freq: Once | ORAL | Status: AC
  Administered 2014-12-07: 20 meq via ORAL
  Filled 2014-12-07: qty 1

## 2014-12-07 MED ORDER — SODIUM CHLORIDE 0.9 % IV SOLN
Freq: Once | INTRAVENOUS | Status: AC
  Administered 2014-12-07: 12:00:00 via INTRAVENOUS

## 2014-12-07 NOTE — Evaluation (Addendum)
Occupational Therapy Evaluation Patient Details Name: Stephen [name] MRN: [id number] DOB: Mar 19, 1941 Today's Date: 12/07/2014    History of Present Illness pt with L AKA and hx of deconditioning PTA.     Clinical Impression   Pt s/p above. Pt independent with ADLs, PTA. Feel pt will benefit from acute OT to increase independence prior to d/c. Recommending CIR for rehab.    Follow Up Recommendations  CIR;Supervision/Assistance - 24 hour    Equipment Recommendations  Other (comment) (defer to next venue)    Recommendations for Other Services Rehab consult     Precautions / Restrictions Precautions Precautions: Fall Restrictions Weight Bearing Restrictions: Yes LLE Weight Bearing: Non weight bearing      Mobility Bed Mobility Overal bed mobility: Needs Assistance Bed Mobility: Supine to Sit     Supine to sit: Max assist     General bed mobility comments: cues and assist to scoot EOB. Cues for supine to sit.   Transfers Overall transfer level: Needs assistance   Transfers: Sit to/from [employer] Transfers Sit to Stand: Max assist;+2 physical assistance   Squat pivot transfers: +2 physical assistance;Max assist     General transfer comment: cues for technique. Pt unable to stand upright. Performed squat pivot to chair.    Balance    Heavy assist to transfer to chair.                                         ADL Overall ADL's : Needs assistance/impaired Eating/Feeding: Set up;Supervision/ safety;Sitting   Grooming: Wash/dry face;Wash/dry hands;Applying deodorant;Maximal assistance;Sitting;Moderate assistance Grooming Details (indicate cue type and reason): instructed pt to wash face and he was washing hands and then told pt to apply deodorant and pt put it on his neck and hands.  Upper Body Bathing: Moderate assistance;Maximal assistance;Sitting   Lower Body Bathing: Maximal assistance;Bed level   Upper Body Dressing : Sitting;Moderate  assistance;Maximal assistance   Lower Body Dressing: Total assistance;Bed level   Toilet Transfer: Maximal assistance;+2 for physical assistance;Squat-pivot (from bed to chair)   Toileting- Clothing Manipulation and Hygiene: Maximal assistance;Bed level       Functional mobility during ADLs: Maximal assistance;+2 for physical assistance (squat pivot) General ADL Comments: Pt having difficulty following commands in session and with apparent cognitive deficits. Performed squat pivot to chair.      Vision                     Perception     Praxis      Pertinent Vitals/Pain Pain Assessment: Faces Faces Pain Scale: Hurts little more Pain Location: Lt residual limb Pain Intervention(s): Monitored during session     Hand Dominance     Extremity/Trunk Assessment Upper Extremity Assessment Upper Extremity Assessment: Generalized weakness   Lower Extremity Assessment Lower Extremity Assessment: Defer to PT evaluation       Communication Communication Communication: No difficulties   Cognition Arousal/Alertness: Awake/alert Behavior During Therapy: Flat affect Overall Cognitive Status: Impaired/Different from baseline (unsure of baseline) Area of Impairment: Orientation;Following commands;Safety/judgement;Problem solving;Attention Orientation Level: Disoriented to;Time Current Attention Level: Sustained   Following Commands: Follows one step commands inconsistently;Follows one step commands with increased time Safety/Judgement: Decreased awareness of safety   Problem Solving: Slow processing;Decreased initiation;Requires verbal cues;Requires tactile cues     General Comments       Exercises       Shoulder Instructions  Home Living Family/patient expects to be discharged to:: Inpatient rehab Living Arrangements: Spouse/significant other                               Additional Comments: pt indicates his spouse is disabled.         Prior Functioning/Environment Level of Independence: Independent with assistive device(s)        Comments: pt only ambulates short distances, otherwise used W/C for mobility.      OT Diagnosis: Generalized weakness;Acute pain;Altered mental status   OT Problem List: Decreased strength;Impaired balance (sitting and/or standing);Decreased cognition;Decreased safety awareness;Decreased knowledge of use of DME or AE;Decreased knowledge of precautions;Pain;Decreased activity tolerance   OT Treatment/Interventions: Self-care/ADL training;Therapeutic exercise;DME and/or AE instruction;Therapeutic activities;Cognitive remediation/compensation;Balance training;Patient/family education    OT Goals(Current goals can be found in the care plan section) Acute Rehab OT Goals Patient Stated Goal: go home OT Goal Formulation: Patient unable to participate in goal setting (however participated in session today) Time For Goal Achievement: 12/14/14 Potential to Achieve Goals: Good ADL Goals Pt Will Perform Grooming: with min assist;sitting (sitting EOB) Pt Will Perform Lower Body Dressing: with mod assist;sitting/lateral leans;bed level Pt Will Transfer to Toilet: squat pivot transfer;bedside commode;with min assist Additional ADL Goal #1: Pt will perform bed mobility at Min A level as precursor for ADLs.  OT Frequency: Min 2X/week   Barriers to D/C:            Co-evaluation              End of Session Equipment Utilized During Treatment: Gait belt;Oxygen Nurse Communication: Mobility status;Other (comment) (transfer technique; cognition)  Activity Tolerance: Patient tolerated treatment well Patient left: in chair;with call bell/phone within reach;with restraints reapplied;with nursing/sitter in room; precautions   Time: 6803-2122 OT Time Calculation (min): 15 min Charges:  OT General Charges $OT Visit: 1 Procedure OT Evaluation $Initial OT Evaluation Tier I: 1 Procedure G-CodesBenito Mccreedy OTR/L [id number] 12/07/2014, 10:24 AM

## 2014-12-07 NOTE — Progress Notes (Addendum)
[facility]  Subjective  - still confused   Objective 136/62 103 98.9 F (37.2 C) (Oral) 13 95%  Intake/Output Summary (Last 24 hours) at 12/07/14 0841 Last data filed at 12/07/14 0700  Gross per 24 hour  Intake 2045.07 ml  Output   2200 ml  Net -154.93 ml   Left AKA some oozing from lateral aspect, no focal hematoma Dressing changed  Assessment/Planning: Dry 4 x 4 kerlix ACE once daily  Blood loss anemia tranfuse per primary team  Off heparin, will check coags  Mescal Flinchbaugh E 12/07/2014 8:41 AM --  Laboratory Lab Results:  Recent Labs  12/06/14 0230 12/06/14 1950 12/07/14 0236  WBC 9.9  --  8.1  HGB 6.6* 7.5* 7.4*  HCT 20.5* 22.8* 22.7*  PLT 452*  --  429*   BMET  Recent Labs  12/06/14 0230 12/07/14 0236  NA 134* 132*  K 3.1* 3.9  CL 93* 92*  CO2 33* 32  GLUCOSE 134* 166*  BUN 16 15  CREATININE 1.02 1.10  CALCIUM 8.3* 8.2*    COAG Lab Results  Component Value Date   INR 0.97 12/03/2014   INR 1.04 10/29/2013   INR 1.22 04/23/2010   No results found for: PTT

## 2014-12-07 NOTE — Progress Notes (Addendum)
[facility]                                                                              Patient Demographics  Stephen [name], is a 74 y.o. male, DOB - 1941/09/02, ZOX:[id number]  Admit date - 12/03/2014   Admitting Physician Sid Falcon, MD  Outpatient Primary MD for the patient is Reynold Bowen, NT  LOS - 4   Chief Complaint  Patient presents with  . Leg Swelling      HPI on 12/03/2014 by Dr. Linna Darner Stephen [name] is a 74 y.o. Male.Leg pain: L. Getting worse. Located in L ankle w/o radiation. Open sore and w/ purulent discharge. Open wound on ankle present for 8-9 mo. Associated w/ generalized body aches. Deneis fevers, SOB, palpitations. Hydrocodone w/o benefit in ankle. Pain is worse w/ movement.  CP a couple nights ago that was relieved w/ "CP pill." but pt unsure of what medicine it was he took.  CBG at home range from 400-500 routinely  Interim History Patient status post left AKA. Vascular surgery following. PTOT currently pending.  Assessment & Plan   Peripheral Vascular Disease -patient has had stenting of the bilateral iliac systems for ulcer -Healed ulcers on right lower extremity -Nonhealing open wounds on Left lower ext.   -Vascular surgery consulted and appreciated, patient is s/p Left AKA -Was initially placed on vancomycin and zosyn- stopped on 12/06/14 -Will consult PT/OT -PMR consulted  Anemia secondary to Acute Blood Loss - 1uPRBCs transfused, 2/2, another unit transfused 2/3. -Continue to monitor CBC  Acute encephalopathy - Patient has mild baseline dementia, with acute delirium. - Will start on low-dose Seroquel.  Elevated troponin -Patient had chest pain a few days ago, currently chest pain free -Cardiology consulted and appreciated and does not feel this is secondary to ACS.  Pending echocardiogram results, if no change, discontinue heparin -Echocardiogram shows EF 30-35%, spoke with cardiology will discontinue heparin at this  time -Continue aspirin and plavix, statin, metoprolol, lisinopril  Sepsis secondary to LLE cellulitis/nonhealing uclers -Improving, Treatment and plan as above -Leukocytosis and fever resolved  Chronic systolic and diastolic heart failure -Echocardiogram December 2014 shows EF of 81-19%, grade 1 diastolic dysfunction -Continue home regimen of Lasix, metolazone, spironolactone, metoprolol, lisinopril -Monitor daily weights, intake and output -Repeat Echocardiogram: EF30-35%, moderate diffuse hypokinesis  Diabetes mellitus, type II -Hemoglobin A1c 6.6 -Continue Lantus, insulin sliding scale, CBG monitoring -Metformin and glipizide held  Hypertension -Continue home regimen: Metoprolol, lisinopril, diuretics  Pancreatic insufficiency -Continue creon  COPD -Currently compensated, no wheezing -Continue Symbicort, Spiriva  Neuropathic pain -Continue Neurontin  Hyperlipidemia -Continue statin  Tobacco abuse -Patient counseled continue nicotine patch  Code Status: DNR  Family Communication: Spoke with son over the phone  Disposition Plan: CIR on medically stable  Time Spent in minutes   30 minutes  Procedures  Echocardiogram Left AKA  Consults   Cardiology Vascular Surgery PMR  DVT Prophylaxis  SCDs (RLE), patient is already on full dose full dose aspirin and Plavix, with anemia, with mild bleed at surgical site, will want to start him on any further prophylaxis.  Lab Results  Component Value Date   PLT 429* 12/07/2014  Medications  Scheduled Meds: . antiseptic oral rinse  7 mL Mouth Rinse BID  . aspirin  325 mg Oral Daily  . atorvastatin  40 mg Oral Daily  . clopidogrel  75 mg Oral Daily  . docusate sodium  100 mg Oral Daily  . furosemide  40 mg Oral Daily  . gabapentin  300 mg Oral QHS  . insulin aspart  0-15 Units Subcutaneous TID WC  . isosorbide mononitrate  30 mg Oral Daily  . lipase/protease/amylase  72,000 Units Oral TID WC  . lisinopril  40  mg Oral Daily  . metolazone  2.5 mg Oral Daily  . metoprolol tartrate  25 mg Oral BID  . nicotine  14 mg Transdermal Daily  . pantoprazole  40 mg Oral Daily  . potassium chloride  20 mEq Oral Daily  . potassium chloride  20 mEq Oral Once  . sodium chloride  3 mL Intravenous Q12H  . spironolactone  25 mg Oral Daily  . tiotropium  18 mcg Inhalation Daily   Continuous Infusions: . sodium chloride 75 mL/hr at 12/07/14 0535   PRN Meds:.acetaminophen **OR** acetaminophen, budesonide-formoterol, furosemide, levalbuterol, lipase/protease/amylase, morphine injection, ondansetron **OR** ondansetron (ZOFRAN) IV  Antibiotics    Anti-infectives    Start     Dose/Rate Route Frequency Ordered Stop   12/06/14 1200  piperacillin-tazobactam (ZOSYN) IVPB 3.375 g     3.375 g12.5 mL/hr over 240 Minutes Intravenous  Once 12/06/14 1127 12/06/14 1646   12/06/14 1200  vancomycin (VANCOCIN) IVPB 750 mg/150 ml premix     750 mg150 mL/hr over 60 Minutes Intravenous  Once 12/06/14 1127 12/06/14 1347   12/04/14 1200  vancomycin (VANCOCIN) IVPB 750 mg/150 ml premix  Status:  Discontinued     750 mg150 mL/hr over 60 Minutes Intravenous Every 12 hours 12/03/14 2216 12/06/14 1127   12/04/14 0400  piperacillin-tazobactam (ZOSYN) IVPB 3.375 g  Status:  Discontinued     3.375 g12.5 mL/hr over 240 Minutes Intravenous Every 8 hours 12/03/14 2216 12/06/14 1127   12/03/14 2100  piperacillin-tazobactam (ZOSYN) IVPB 3.375 g     3.375 g100 mL/hr over 30 Minutes Intravenous  Once 12/03/14 2024 12/04/14 0011   12/03/14 2030  vancomycin (VANCOCIN) 1,500 mg in sodium chloride 0.9 % 500 mL IVPB     1,500 mg250 mL/hr over 120 Minutes Intravenous  Once 12/03/14 2024 12/04/14 0200        Subjective:   Stephen [name] seen and examined today. Complains of left leg pain.  Denies chest pain or shortness of breath.  Objective:   Filed Vitals:   12/07/14 0921 12/07/14 0938 12/07/14 1130 12/07/14 1205  BP: 143/63  98/48 90/52   Pulse: 109  88 89  Temp:   98.1 F (36.7 C) 98 F (36.7 C)  TempSrc:   Oral Oral  Resp: 24  13 15   Height:      Weight:      SpO2: 97% 94% 92% 98%    Wt Readings from Last 3 Encounters:  12/07/14 68.8 kg (151 lb 10.8 oz)  11/03/14 77.111 kg (170 lb)  09/23/14 77.111 kg (170 lb)     Intake/Output Summary (Last 24 hours) at 12/07/14 1523 Last data filed at 12/07/14 0700  Gross per 24 hour  Intake   1125 ml  Output   2175 ml  Net  -1050 ml    Exam  General: Well developed, malnourished appears stated age  65: NCAT, mucous membranes moist.   Cardiovascular: S1 S2 auscultated,  no rubs, murmurs or gallops. Regular rate and rhythm.  Respiratory: Diminished breath sounds, rhonchi lung bases  Abdomen: Soft, nontender, nondistended, + bowel sounds  Extremities: Left AKA- with bandage, with small amount of blood flowing through the gauze.  Neuro: Awake, confused, nonfocal, sleepy  Data Review   Micro Results Recent Results (from the past 240 hour(s))  MRSA PCR Screening     Status: None   Collection Time: 12/03/14  9:31 PM  Result Value Ref Range Status   MRSA by PCR NEGATIVE NEGATIVE Final    Comment:        The GeneXpert MRSA Assay (FDA approved for NASAL specimens only), is one component of a comprehensive MRSA colonization surveillance program. It is not intended to diagnose MRSA infection nor to guide or monitor treatment for MRSA infections.     Radiology Reports Dg Chest 1 View  12/03/2014   CLINICAL DATA:  Bilateral lower extremity swelling.  EXAM: CHEST - 1 VIEW  COMPARISON:  PA and lateral chest 10/05/2014 the hand 10/23/2013.  FINDINGS: Heart size is normal. The patient is status post CABG. Lungs are clear. No pneumothorax or pleural effusion.  IMPRESSION: No acute disease.   Electronically Signed   By: Inge Rise M.D.   On: 12/03/2014 17:47   Dg Tibia/fibula Left  12/03/2014   CLINICAL DATA:  Bilateral lower extremity swelling. Wound  over the medial malleolus of the left ankle.  EXAM: LEFT TIBIA AND FIBULA - 2 VIEW  COMPARISON:  None.  FINDINGS: Wound over the medial malleolus is identified. There may be an additional wound posteriorly along the left lower leg. a No acute bony or joint abnormality is seen. Atherosclerosis is noted.  IMPRESSION: Skin wounds without underlying acute bony or joint abnormality.  Atherosclerosis.   Electronically Signed   By: Inge Rise M.D.   On: 12/03/2014 17:49   Dg Ankle Complete Left  12/03/2014   CLINICAL DATA:  Bilateral leg swelling, bilateral ankle swelling, large round necrotic sore overlying medial malleolus on left ankle  EXAM: LEFT ANKLE COMPLETE - 3+ VIEW  COMPARISON:  None.  FINDINGS: No fracture.  Ankle mortise is normally spaced and aligned.  There is a soft tissue defect extending to the surface of the bone overlying the medial malleolus. No bone resorption is seen to suggest osteomyelitis. There is no soft tissue air. No radiopaque foreign body.  Mild diffuse subcutaneous soft tissue edema is noted of the lower leg and ankle.  IMPRESSION: No fracture.  No evidence of osteomyelitis.   Electronically Signed   By: Lajean Manes M.D.   On: 12/03/2014 17:47   Dg Ankle Complete Right  12/03/2014   CLINICAL DATA:  Bilateral lower extremity swelling and pain. Initial encounter.  EXAM: RIGHT ANKLE - COMPLETE 3+ VIEW  COMPARISON:  None.  FINDINGS: Soft tissue swelling is seen about the right ankle. No underlying acute bony or joint abnormality is identified. No soft tissue gas collection is seen. Atherosclerosis is noted.  IMPRESSION: Soft tissue swelling.  Otherwise negative.   Electronically Signed   By: Inge Rise M.D.   On: 12/03/2014 17:50    CBC  Recent Labs Lab 12/03/14 1710 12/04/14 0418 12/05/14 0015 12/06/14 0230 12/06/14 1950 12/07/14 0236  WBC 15.3* 13.7* 15.2* 9.9  --  8.1  HGB 9.7* 9.0* 7.7* 6.6* 7.5* 7.4*  HCT 29.5* 28.7* 24.8* 20.5* 22.8* 22.7*  PLT 548* 576*  537* 452*  --  429*  MCV 90.5 90.5 90.2 88.4  --  88.7  MCH 29.8 28.4 28.0 28.4  --  28.9  MCHC 32.9 31.4 31.0 32.2  --  32.6  RDW 14.5 14.6 14.9 14.6  --  14.3  LYMPHSABS 1.5  --   --   --   --   --   MONOABS 1.1*  --   --   --   --   --   EOSABS 0.1  --   --   --   --   --   BASOSABS 0.0  --   --   --   --   --     Chemistries   Recent Labs Lab 12/03/14 1710 12/04/14 0418 12/05/14 0015 12/06/14 0230 12/07/14 0236  NA 132* 134* 131* 134* 132*  K 4.3 3.8 3.7 3.1* 3.9  CL 90* 93* 90* 93* 92*  CO2 32 31 31 33* 32  GLUCOSE 250* 160* 178* 134* 166*  BUN 31* 23 22 16 15   CREATININE 1.30 0.99 1.06 1.02 1.10  CALCIUM 8.8 8.8 8.8 8.3* 8.2*  MG  --   --   --  1.4* 1.4*  AST  --  13  --   --   --   ALT  --  9  --   --   --   ALKPHOS  --  79  --   --   --   BILITOT  --  0.4  --   --   --    ------------------------------------------------------------------------------------------------------------------ estimated creatinine clearance is 59.1 mL/min (by C-G formula based on Cr of 1.1). ------------------------------------------------------------------------------------------------------------------ No results for input(s): HGBA1C in the last 72 hours. ------------------------------------------------------------------------------------------------------------------ No results for input(s): CHOL, HDL, LDLCALC, TRIG, CHOLHDL, LDLDIRECT in the last 72 hours. ------------------------------------------------------------------------------------------------------------------ No results for input(s): TSH, T4TOTAL, T3FREE, THYROIDAB in the last 72 hours.  Invalid input(s): FREET3 ------------------------------------------------------------------------------------------------------------------ No results for input(s): VITAMINB12, FOLATE, FERRITIN, TIBC, IRON, RETICCTPCT in the last 72 hours.  Coagulation profile  Recent Labs Lab 12/03/14 1710  INR 0.97    No results for input(s):  DDIMER in the last 72 hours.  Cardiac Enzymes  Recent Labs Lab 12/04/14 0418 12/04/14 1010 12/04/14 1538  TROPONINI 0.15* 0.10* 0.08*   ------------------------------------------------------------------------------------------------------------------ Invalid input(s): POCBNP    ELGERGAWY, DAWOOD D.O. on 12/07/2014 at 3:23 PM  Between 7am to 7pm - Pager - [phone]  After 7pm go to [email or web address] - password [email or web address]  And look for the night coverage person covering for me after hours  [facility] Office  [phone]

## 2014-12-07 NOTE — Progress Notes (Signed)
Medicare Important Message given? YES  (If response is "NO", the following Medicare IM given date fields will be blank)  Date Medicare IM given: 12/07/14 Medicare IM given by:  Dahlia Client [employer]

## 2014-12-07 NOTE — Progress Notes (Signed)
Rehab admissions - I am following pt's case and met with pt and his family this am. [name], dtr, grand-dtr and great grand-dtr were present. [name] is very hard of hearing and grand-dtr helped to reinforce our conversation. Grand-dtr helped to give baseline details and shared that she and pt's son Vicente Males plan to be the primary caregivers for pt and pt's wife.   I explained that we are waiting on response from [facility] Medicare. I will keep the pt/family and medical team aware of any updates.  Pt was currently receiving PRBC's, had lap restraint and safety sitter present. As I was leaving the room, pt was asking to stand up and needed direction to remain seated.  We will consider possible inpatient rehab admit pending his medical clearance and improvement in cognitive status, insurance approval and our bed availability.  Please call me with any questions. Thanks.  Nanetta Batty, PT Rehabilitation Admissions Coordinator [phone]

## 2014-12-07 NOTE — Progress Notes (Signed)
Pt ordered to get Lasix after 1uPRBCs transfused. Held due to hypotension, BP 90s/40s. Will continue to monitor and give when pt's BP improves.

## 2014-12-07 NOTE — Progress Notes (Signed)
SUBJECTIVE:  confused  OBJECTIVE:   Vitals:   Filed Vitals:   12/07/14 0427 12/07/14 0530 12/07/14 0700 12/07/14 0938  BP: 136/62     Pulse: 103     Temp: 99.2 F (37.3 C)  98.9 F (37.2 C)   TempSrc: Oral  Oral   Resp: 13     Height:  5\' 10"  (1.778 m)    Weight:  151 lb 10.8 oz (68.8 kg)    SpO2: 95%   94%   I&O's:   Intake/Output Summary (Last 24 hours) at 12/07/14 1018 Last data filed at 12/07/14 0700  Gross per 24 hour  Intake 1526.07 ml  Output   2200 ml  Net -673.93 ml   TELEMETRY: Reviewed telemetry pt in NSR:     PHYSICAL EXAM General: Well developed, well nourished, frail Head:   Normal cephalic and atramatic  Lungs:  No wheezing Heart: HRRR    Abdomen: abdomen soft and non-tender Msk:  Back normal,  Normal strength and tone for age. Extremities:  Left BKA  Neuro: confused Psych:  Normal affect, responds appropriately Skin: No rash   LABS: Basic Metabolic Panel:  Recent Labs  12/06/14 0230 12/07/14 0236  NA 134* 132*  K 3.1* 3.9  CL 93* 92*  CO2 33* 32  GLUCOSE 134* 166*  BUN 16 15  CREATININE 1.02 1.10  CALCIUM 8.3* 8.2*  MG 1.4* 1.4*   Liver Function Tests: No results for input(s): AST, ALT, ALKPHOS, BILITOT, PROT, ALBUMIN in the last 72 hours. No results for input(s): LIPASE, AMYLASE in the last 72 hours. CBC:  Recent Labs  12/06/14 0230 12/06/14 1950 12/07/14 0236  WBC 9.9  --  8.1  HGB 6.6* 7.5* 7.4*  HCT 20.5* 22.8* 22.7*  MCV 88.4  --  88.7  PLT 452*  --  429*   Cardiac Enzymes:  Recent Labs  12/04/14 1538  TROPONINI 0.08*   BNP: Invalid input(s): POCBNP D-Dimer: No results for input(s): DDIMER in the last 72 hours. Hemoglobin A1C: No results for input(s): HGBA1C in the last 72 hours. Fasting Lipid Panel: No results for input(s): CHOL, HDL, LDLCALC, TRIG, CHOLHDL, LDLDIRECT in the last 72 hours. Thyroid Function Tests: No results for input(s): TSH, T4TOTAL, T3FREE, THYROIDAB in the last 72  hours.  Invalid input(s): FREET3 Anemia Panel: No results for input(s): VITAMINB12, FOLATE, FERRITIN, TIBC, IRON, RETICCTPCT in the last 72 hours. Coag Panel:   Lab Results  Component Value Date   INR 0.97 12/03/2014   INR 1.04 10/29/2013   INR 1.22 04/23/2010    RADIOLOGY: Dg Chest 1 View  12/03/2014   CLINICAL DATA:  Bilateral lower extremity swelling.  EXAM: CHEST - 1 VIEW  COMPARISON:  PA and lateral chest 10/05/2014 the hand 10/23/2013.  FINDINGS: Heart size is normal. The patient is status post CABG. Lungs are clear. No pneumothorax or pleural effusion.  IMPRESSION: No acute disease.   Electronically Signed   By: Inge Rise M.D.   On: 12/03/2014 17:47   Dg Tibia/fibula Left  12/03/2014   CLINICAL DATA:  Bilateral lower extremity swelling. Wound over the medial malleolus of the left ankle.  EXAM: LEFT TIBIA AND FIBULA - 2 VIEW  COMPARISON:  None.  FINDINGS: Wound over the medial malleolus is identified. There may be an additional wound posteriorly along the left lower leg. a No acute bony or joint abnormality is seen. Atherosclerosis is noted.  IMPRESSION: Skin wounds without underlying acute bony or joint abnormality.  Atherosclerosis.  Electronically Signed   By: Inge Rise M.D.   On: 12/03/2014 17:49   Dg Ankle Complete Left  12/03/2014   CLINICAL DATA:  Bilateral leg swelling, bilateral ankle swelling, large round necrotic sore overlying medial malleolus on left ankle  EXAM: LEFT ANKLE COMPLETE - 3+ VIEW  COMPARISON:  None.  FINDINGS: No fracture.  Ankle mortise is normally spaced and aligned.  There is a soft tissue defect extending to the surface of the bone overlying the medial malleolus. No bone resorption is seen to suggest osteomyelitis. There is no soft tissue air. No radiopaque foreign body.  Mild diffuse subcutaneous soft tissue edema is noted of the lower leg and ankle.  IMPRESSION: No fracture.  No evidence of osteomyelitis.   Electronically Signed   By: Lajean Manes M.D.   On: 12/03/2014 17:47   Dg Ankle Complete Right  12/03/2014   CLINICAL DATA:  Bilateral lower extremity swelling and pain. Initial encounter.  EXAM: RIGHT ANKLE - COMPLETE 3+ VIEW  COMPARISON:  None.  FINDINGS: Soft tissue swelling is seen about the right ankle. No underlying acute bony or joint abnormality is identified. No soft tissue gas collection is seen. Atherosclerosis is noted.  IMPRESSION: Soft tissue swelling.  Otherwise negative.   Electronically Signed   By: Inge Rise M.D.   On: 12/03/2014 17:50      ASSESSMENT:/PLAN: Ischemic cardiomyopathy:  2D echo 12/05/14: mod-severely reduced LV function, EF 30-35%, moderate diffuse HK with severe HK of inferior MK, mild-mod MR. EF down from prior echo (EF by echo 45-50% in 10/2013 but EF by cath in 10/2013 was 40% with 3/3 patent bypass grafts - cath done at that time for + troponin in setting of COPD exacerbation).   Cr stable. Would give additional lasix and potassium if RBCs given.    Jettie Booze, MD  12/07/2014  10:18 AM

## 2014-12-08 LAB — GLUCOSE, CAPILLARY
Glucose-Capillary: 389 mg/dL — ABNORMAL HIGH (ref 70–99)
Glucose-Capillary: 261 mg/dL — ABNORMAL HIGH (ref 70–99)
Glucose-Capillary: 227 mg/dL — ABNORMAL HIGH (ref 70–99)
Glucose-Capillary: 393 mg/dL — ABNORMAL HIGH (ref 70–99)
Glucose-Capillary: 195 mg/dL — ABNORMAL HIGH (ref 70–99)

## 2014-12-08 LAB — CBC
HCT: 24.1 % — ABNORMAL LOW (ref 39.0–52.0)
HCT: 28.4 % — ABNORMAL LOW (ref 39.0–52.0)
HEMOGLOBIN: 9.5 g/dL — AB (ref 13.0–17.0)
Hemoglobin: 7.9 g/dL — ABNORMAL LOW (ref 13.0–17.0)
MCH: 28.2 pg (ref 26.0–34.0)
MCH: 29.1 pg (ref 26.0–34.0)
MCHC: 32.8 g/dL (ref 30.0–36.0)
MCHC: 33.5 g/dL (ref 30.0–36.0)
MCV: 86.1 fL (ref 78.0–100.0)
MCV: 86.9 fL (ref 78.0–100.0)
Platelets: 415 10*3/uL — ABNORMAL HIGH (ref 150–400)
Platelets: 466 10*3/uL — ABNORMAL HIGH (ref 150–400)
RBC: 2.8 MIL/uL — ABNORMAL LOW (ref 4.22–5.81)
RBC: 3.27 MIL/uL — ABNORMAL LOW (ref 4.22–5.81)
RDW: 15.5 % (ref 11.5–15.5)
RDW: 14.9 % (ref 11.5–15.5)
WBC: 7.7 10*3/uL (ref 4.0–10.5)
WBC: 10.1 10*3/uL (ref 4.0–10.5)

## 2014-12-08 LAB — BASIC METABOLIC PANEL
Anion gap: 8 (ref 5–15)
BUN: 14 mg/dL (ref 6–23)
CO2: 34 mmol/L — ABNORMAL HIGH (ref 19–32)
Calcium: 8.3 mg/dL — ABNORMAL LOW (ref 8.4–10.5)
Chloride: 93 mmol/L — ABNORMAL LOW (ref 96–112)
Creatinine, Ser: 0.81 mg/dL (ref 0.50–1.35)
GFR calc Af Amer: >90 mL/min (ref >90)
GFR calc non Af Amer: 87 mL/min — ABNORMAL LOW (ref >90)
GLUCOSE: 207 mg/dL — AB (ref 70–99)
Potassium: 3.7 mmol/L (ref 3.5–5.1)
SODIUM: 135 mmol/L (ref 135–145)

## 2014-12-08 LAB — APTT: APTT: 49 s — AB (ref 24–37)

## 2014-12-08 LAB — ALBUMIN: ALBUMIN: 1.6 g/dL — AB (ref 3.5–5.2)

## 2014-12-08 LAB — PROTIME-INR
INR: 1.08 (ref 0.00–1.49)
Prothrombin Time: 14.1 seconds (ref 11.6–15.2)

## 2014-12-08 LAB — PREPARE RBC (CROSSMATCH)

## 2014-12-08 MED ORDER — SODIUM CHLORIDE 0.9 % IV SOLN
Freq: Once | INTRAVENOUS | Status: AC
  Administered 2014-12-08: 10 mL/h via INTRAVENOUS

## 2014-12-08 MED ORDER — ENSURE PUDDING PO PUDG
1.0000 | Freq: Three times a day (TID) | ORAL | Status: DC
  Administered 2014-12-08 – 2014-12-09 (×4): 1 via ORAL

## 2014-12-08 MED ORDER — LEVALBUTEROL HCL 0.63 MG/3ML IN NEBU
INHALATION_SOLUTION | RESPIRATORY_TRACT | Status: AC
  Filled 2014-12-08: qty 3

## 2014-12-08 MED ORDER — PRO-STAT SUGAR FREE PO LIQD
30.0000 mL | Freq: Three times a day (TID) | ORAL | Status: DC
  Administered 2014-12-08 – 2014-12-09 (×4): 30 mL via ORAL
  Filled 2014-12-08 (×7): qty 30

## 2014-12-08 MED ORDER — LEVALBUTEROL HCL 0.63 MG/3ML IN NEBU
INHALATION_SOLUTION | RESPIRATORY_TRACT | Status: AC
  Administered 2014-12-08: 0.63 mg
  Filled 2014-12-08: qty 3

## 2014-12-08 MED ORDER — ENSURE COMPLETE PO LIQD
237.0000 mL | Freq: Two times a day (BID) | ORAL | Status: DC
  Administered 2014-12-08 – 2014-12-09 (×3): 237 mL via ORAL

## 2014-12-08 MED ORDER — INSULIN GLARGINE 100 UNIT/ML ~~LOC~~ SOLN
8.0000 [IU] | Freq: Every day | SUBCUTANEOUS | Status: DC
  Administered 2014-12-08 – 2014-12-09 (×2): 8 [IU] via SUBCUTANEOUS
  Filled 2014-12-08 (×2): qty 0.08

## 2014-12-08 MED ORDER — POTASSIUM CHLORIDE CRYS ER 20 MEQ PO TBCR
40.0000 meq | EXTENDED_RELEASE_TABLET | Freq: Once | ORAL | Status: DC
Start: 2014-12-08 — End: 2014-12-09

## 2014-12-08 MED ORDER — FUROSEMIDE 10 MG/ML IJ SOLN
20.0000 mg | Freq: Once | INTRAMUSCULAR | Status: AC
  Administered 2014-12-08: 20 mg via INTRAVENOUS

## 2014-12-08 NOTE — Progress Notes (Signed)
Utilization review completed.  

## 2014-12-08 NOTE — Progress Notes (Signed)
PT Cancellation Note  Patient Details Name: Stephen [name] MRN: [id number] DOB: [birth year]   Cancelled Treatment:    Reason Eval/Treat Not Completed: Fatigue/lethargy limiting ability to participate.  Pt more lethargic and per RT pt beginning to wheeze.  Pt with egg and sausage pocketed in his cheek, assisted to clear this out of pt's mouth.  Will hold mobility at this time and f/u as appropriate.     Caera Enwright, Thornton Papas 12/08/2014, 10:16 AM

## 2014-12-08 NOTE — Progress Notes (Signed)
Rehab admissions - I am following pt's case and we did receive insurance approval from [facility] Medicare for inpatient rehab. Rehab PA examined pt this am and we have reviewed pt's case. At this time, pt is not medically ready for inpatient rehab due to mental status. Pt is also receiving PRBC's this am. Safety sitter present. Rehab PA is hopeful that pt may be appropriate for CIR tomorrow.   Received a call from [place], Tourist information centre manager and shared update with her. I also discussed case with Dr. Waldron Labs. MD states that pt may be ready for CIR tomorrow and that pt has baseline dementia.  I called and updated pt's family.  I will check on pt's status tomorrow and possibly consider inpatient rehab admit pending his medical clearance and our bed availability.  Please call me with any questions. Thanks.  Nanetta Batty, PT Rehabilitation Admissions Coordinator [phone]

## 2014-12-08 NOTE — Progress Notes (Signed)
SUBJECTIVE:  Confused, sleepy. States he feels rough.  OBJECTIVE:   Vitals:   Filed Vitals:   12/07/14 2026 12/07/14 2126 12/08/14 0017 12/08/14 0323  BP: 129/54 126/65 143/64 152/66  Pulse: 107  94 97  Temp:   99.8 F (37.7 C) 97.9 F (36.6 C)  TempSrc:   Oral Oral  Resp: 21  19 16   Height:      Weight:    151 lb 0.2 oz (68.5 kg)  SpO2: 93%  99% 96%   I&O's:    Intake/Output Summary (Last 24 hours) at 12/08/14 8469 Last data filed at 12/08/14 0300  Gross per 24 hour  Intake   1040 ml  Output    800 ml  Net    240 ml   TELEMETRY: Reviewed telemetry pt in NSR with PVCs.:  PHYSICAL EXAM General: Well developed, well nourished, frail Head:   Normal cephalic and atramatic, poor dentition  Lungs:  Coarse bilateral rhonchi Heart: HRRR , no gallop or murmur.  Abdomen: abdomen soft and non-tender Msk:  Back normal,  Normal strength and tone for age. Extremities:  Left AKA, right leg with dressed ulcer on shin Neuro: confused Skin: No rash   LABS: Basic Metabolic Panel:  Recent Labs  12/06/14 0230 12/07/14 0236 12/08/14 0248  NA 134* 132* 135  K 3.1* 3.9 3.7  CL 93* 92* 93*  CO2 33* 32 34*  GLUCOSE 134* 166* 207*  BUN 16 15 14   CREATININE 1.02 1.10 0.81  CALCIUM 8.3* 8.2* 8.3*  MG 1.4* 1.4*  --    Liver Function Tests:  Recent Labs  12/08/14 0248  ALBUMIN 1.6*   No results for input(s): LIPASE, AMYLASE in the last 72 hours. CBC:  Recent Labs  12/07/14 0236 12/07/14 1902 12/08/14 0248  WBC 8.1  --  7.7  HGB 7.4* 8.5* 7.9*  HCT 22.7* 26.1* 24.1*  MCV 88.7  --  86.1  PLT 429*  --  415*   Cardiac Enzymes: No results for input(s): CKTOTAL, CKMB, CKMBINDEX, TROPONINI in the last 72 hours. BNP: Invalid input(s): POCBNP D-Dimer: No results for input(s): DDIMER in the last 72 hours. Hemoglobin A1C: No results for input(s): HGBA1C in the last 72 hours. Fasting Lipid Panel: No results for input(s): CHOL, HDL, LDLCALC, TRIG, CHOLHDL, LDLDIRECT in  the last 72 hours. Thyroid Function Tests: No results for input(s): TSH, T4TOTAL, T3FREE, THYROIDAB in the last 72 hours.  Invalid input(s): FREET3 Anemia Panel: No results for input(s): VITAMINB12, FOLATE, FERRITIN, TIBC, IRON, RETICCTPCT in the last 72 hours. Coag Panel:   Lab Results  Component Value Date   INR 1.08 12/08/2014   INR 0.97 12/03/2014   INR 1.04 10/29/2013    RADIOLOGY: Dg Chest 1 View  12/03/2014   CLINICAL DATA:  Bilateral lower extremity swelling.  EXAM: CHEST - 1 VIEW  COMPARISON:  PA and lateral chest 10/05/2014 the hand 10/23/2013.  FINDINGS: Heart size is normal. The patient is status post CABG. Lungs are clear. No pneumothorax or pleural effusion.  IMPRESSION: No acute disease.   Electronically Signed   By: Inge Rise M.D.   On: 12/03/2014 17:47   Dg Tibia/fibula Left  12/03/2014   CLINICAL DATA:  Bilateral lower extremity swelling. Wound over the medial malleolus of the left ankle.  EXAM: LEFT TIBIA AND FIBULA - 2 VIEW  COMPARISON:  None.  FINDINGS: Wound over the medial malleolus is identified. There may be an additional wound posteriorly along the left lower leg. a  No acute bony or joint abnormality is seen. Atherosclerosis is noted.  IMPRESSION: Skin wounds without underlying acute bony or joint abnormality.  Atherosclerosis.   Electronically Signed   By: Inge Rise M.D.   On: 12/03/2014 17:49   Dg Ankle Complete Left  12/03/2014   CLINICAL DATA:  Bilateral leg swelling, bilateral ankle swelling, large round necrotic sore overlying medial malleolus on left ankle  EXAM: LEFT ANKLE COMPLETE - 3+ VIEW  COMPARISON:  None.  FINDINGS: No fracture.  Ankle mortise is normally spaced and aligned.  There is a soft tissue defect extending to the surface of the bone overlying the medial malleolus. No bone resorption is seen to suggest osteomyelitis. There is no soft tissue air. No radiopaque foreign body.  Mild diffuse subcutaneous soft tissue edema is noted of the  lower leg and ankle.  IMPRESSION: No fracture.  No evidence of osteomyelitis.   Electronically Signed   By: Lajean Manes M.D.   On: 12/03/2014 17:47   Dg Ankle Complete Right  12/03/2014   CLINICAL DATA:  Bilateral lower extremity swelling and pain. Initial encounter.  EXAM: RIGHT ANKLE - COMPLETE 3+ VIEW  COMPARISON:  None.  FINDINGS: Soft tissue swelling is seen about the right ankle. No underlying acute bony or joint abnormality is identified. No soft tissue gas collection is seen. Atherosclerosis is noted.  IMPRESSION: Soft tissue swelling.  Otherwise negative.   Electronically Signed   By: Inge Rise M.D.   On: 12/03/2014 17:50      ASSESSMENT:/PLAN:  Ischemic cardiomyopathy EF 30-35%:  2D echo 12/05/14: mod-severely reduced LV function, EF 30-35%, moderate diffuse HK with severe HK of inferior MK, mild-mod MR. EF down from prior echo (EF by echo 45-50% in 10/2013 but EF by cath in 10/2013 was 40% with 3/3 patent bypass grafts - cath done at that time for + troponin in setting of COPD exacerbation). Appears euvolemic on Lasix, aldactone, metolazone, ACEi, metoprolol, and nitrates. Weight is down (post AKA).  Cr stable. Would give additional lasix and potassium if RBCs given.   CAD s/p CABG- no angina.   Patches Mcdonnell M [place], MD  12/08/2014  7:12 AM

## 2014-12-08 NOTE — Progress Notes (Signed)
[facility]                                                                              Patient Demographics  Stephen [name], is a 74 y.o. male, DOB - [birth year], OEU:[id number]  Admit date - 12/03/2014   Admitting Physician Sid Falcon, MD  Outpatient Primary MD for the patient is Reynold Bowen, NT  LOS - 5   Chief Complaint  Patient presents with  . Leg Swelling      HPI on 12/03/2014 by Dr. Linna Darner Stephen [name] is a 74 y.o. Male.Leg pain: L. Getting worse. Located in L ankle w/o radiation. Open sore and w/ purulent discharge. Open wound on ankle present for 8-9 mo. Associated w/ generalized body aches. Deneis fevers, SOB, palpitations. Hydrocodone w/o benefit in ankle. Pain is worse w/ movement.  CP a couple nights ago that was relieved w/ "CP pill." but pt unsure of what medicine it was he took.  CBG at home range from 400-500 routinely  Interim History Patient status post left AKA. Vascular surgery following. PTOT currently pending.  Assessment & Plan   Peripheral Vascular Disease -patient has had stenting of the bilateral iliac systems for ulcer -Healed ulcers on right lower extremity -Nonhealing open wounds on Left lower ext.   -Vascular surgery consulted and appreciated, patient is s/p Left AKA -Was initially placed on vancomycin and zosyn- stopped on 12/06/14 -PMR consulted. - We'll continue with Plavix, will stop full dose aspirin given patient anemia and requirement for transfusion and initial oozing from surgical site.  Anemia secondary to Acute Blood Loss - 1uPRBCs transfused, 2/2, another unit transfused 2/3, and one unit transfused 2/4. -Continue to monitor CBC  Acute encephalopathy - Patient has mild baseline dementia, with acute delirium. - started on low-dose Seroquel.  Elevated troponin -Patient had chest pain a few days ago, currently chest pain free -Cardiology consulted and appreciated and does not feel this is secondary to ACS.  -Echocardiogram shows EF 30-35% -Continue plavix, statin, metoprolol, lisinopril , aspirin held secondary to anemia, with oozing from wound requiring multiple transfusions.  Ischemic cardiomyopathy with ejection fraction 30-35% - Appears to be euvolemic, continue with beta blockers, ACE inhibitor, statin, Plavix. - We'll give extra dose of Lasix today post blood transfusion.  Sepsis secondary to LLE cellulitis/nonhealing uclers -Improving, Treatment and plan as above -Leukocytosis and fever resolved  Chronic systolic and diastolic heart failure -Echocardiogram December 2014 shows EF of 15-40%, grade 1 diastolic dysfunction -Continue home regimen of Lasix, metolazone, spironolactone, metoprolol, lisinopril -Monitor daily weights, intake and output -Repeat Echocardiogram: EF30-35%, moderate diffuse hypokinesis  Diabetes mellitus, type II -Hemoglobin A1c 6.6 - insulin sliding scale, CBG uncontrolled, continue to diet, patient was not on carb modified diet, started on Lantus 8 units daily. -Metformin and glipizide held  Hypertension -Continue home regimen: Metoprolol, lisinopril, diuretics  Pancreatic insufficiency -Continue creon  COPD -Currently compensated, no wheezing -Continue Symbicort, Spiriva  Neuropathic pain -Continue Neurontin  Hyperlipidemia -Continue statin  Tobacco abuse -Patient counseled continue nicotine patch  Code Status: DNR  Family Communication: Spoke with son over the phone  Disposition Plan: CIR on medically stable  Time Spent in minutes  30 minutes  Procedures  Echocardiogram Left AKA  Consults   Cardiology Vascular Surgery PMR  DVT Prophylaxis  SCDs (RLE), patient is already on full dose full dose aspirin and Plavix, with anemia, with mild bleed at surgical site, will want to start him on any further prophylaxis.  Lab Results  Component Value Date   PLT 415* 12/08/2014    Medications  Scheduled Meds: . antiseptic oral rinse  7  mL Mouth Rinse BID  . atorvastatin  40 mg Oral Daily  . clopidogrel  75 mg Oral Daily  . docusate sodium  100 mg Oral Daily  . feeding supplement (ENSURE COMPLETE)  237 mL Oral BID BM  . feeding supplement (ENSURE)  1 Container Oral TID WC  . feeding supplement (PRO-STAT SUGAR FREE 64)  30 mL Oral TID WC  . furosemide  20 mg Intravenous Once  . furosemide  40 mg Oral Daily  . gabapentin  300 mg Oral QHS  . insulin aspart  0-15 Units Subcutaneous TID WC  . insulin glargine  8 Units Subcutaneous Daily  . isosorbide mononitrate  30 mg Oral Daily  . lipase/protease/amylase  72,000 Units Oral TID WC  . lisinopril  40 mg Oral Daily  . metolazone  2.5 mg Oral Daily  . metoprolol tartrate  25 mg Oral BID  . nicotine  14 mg Transdermal Daily  . pantoprazole  40 mg Oral Daily  . potassium chloride  20 mEq Oral Daily  . QUEtiapine  25 mg Oral QHS  . sodium chloride  3 mL Intravenous Q12H  . spironolactone  25 mg Oral Daily  . tiotropium  18 mcg Inhalation Daily   Continuous Infusions:   PRN Meds:.acetaminophen **OR** acetaminophen, budesonide-formoterol, levalbuterol, lipase/protease/amylase, morphine injection, ondansetron **OR** ondansetron (ZOFRAN) IV  Antibiotics    Anti-infectives    Start     Dose/Rate Route Frequency Ordered Stop   12/06/14 1200  piperacillin-tazobactam (ZOSYN) IVPB 3.375 g     3.375 g12.5 mL/hr over 240 Minutes Intravenous  Once 12/06/14 1127 12/06/14 1646   12/06/14 1200  vancomycin (VANCOCIN) IVPB 750 mg/150 ml premix     750 mg150 mL/hr over 60 Minutes Intravenous  Once 12/06/14 1127 12/06/14 1347   12/04/14 1200  vancomycin (VANCOCIN) IVPB 750 mg/150 ml premix  Status:  Discontinued     750 mg150 mL/hr over 60 Minutes Intravenous Every 12 hours 12/03/14 2216 12/06/14 1127   12/04/14 0400  piperacillin-tazobactam (ZOSYN) IVPB 3.375 g  Status:  Discontinued     3.375 g12.5 mL/hr over 240 Minutes Intravenous Every 8 hours 12/03/14 2216 12/06/14 1127   12/03/14  2100  piperacillin-tazobactam (ZOSYN) IVPB 3.375 g     3.375 g100 mL/hr over 30 Minutes Intravenous  Once 12/03/14 2024 12/04/14 0011   12/03/14 2030  vancomycin (VANCOCIN) 1,500 mg in sodium chloride 0.9 % 500 mL IVPB     1,500 mg250 mL/hr over 120 Minutes Intravenous  Once 12/03/14 2024 12/04/14 0200        Subjective:   Stephen [name] seen and examined today. Complains of left leg pain.  Denies chest pain or shortness of breath.  Objective:   Filed Vitals:   12/08/14 0846 12/08/14 0927 12/08/14 0942 12/08/14 1115  BP: 125/58   129/61  Pulse: 93   97  Temp: 98.2 F (36.8 C)   98.2 F (36.8 C)  TempSrc: Oral   Oral  Resp: 15   23  Height:      Weight:  SpO2: 93% 95% 95% 93%    Wt Readings from Last 3 Encounters:  12/08/14 68.5 kg (151 lb 0.2 oz)  11/03/14 77.111 kg (170 lb)  09/23/14 77.111 kg (170 lb)     Intake/Output Summary (Last 24 hours) at 12/08/14 1426 Last data filed at 12/08/14 1115  Gross per 24 hour  Intake    850 ml  Output    800 ml  Net     50 ml    Exam  General: Well developed, malnourished appears stated age  47: NCAT, mucous membranes moist.   Cardiovascular: S1 S2 auscultated, no rubs, murmurs or gallops. Regular rate and rhythm.  Respiratory: Diminished breath sounds, rhonchi lung bases  Abdomen: Soft, nontender, nondistended, + bowel sounds  Extremities: Left AKA- with bandage, no evidence of bleed or discharge.  Neuro: Awake, confused, nonfocal, sleepy  Data Review   Micro Results Recent Results (from the past 240 hour(s))  MRSA PCR Screening     Status: None   Collection Time: 12/03/14  9:31 PM  Result Value Ref Range Status   MRSA by PCR NEGATIVE NEGATIVE Final    Comment:        The GeneXpert MRSA Assay (FDA approved for NASAL specimens only), is one component of a comprehensive MRSA colonization surveillance program. It is not intended to diagnose MRSA infection nor to guide or monitor treatment for MRSA  infections.     Radiology Reports Dg Chest 1 View  12/03/2014   CLINICAL DATA:  Bilateral lower extremity swelling.  EXAM: CHEST - 1 VIEW  COMPARISON:  PA and lateral chest 10/05/2014 the hand 10/23/2013.  FINDINGS: Heart size is normal. The patient is status post CABG. Lungs are clear. No pneumothorax or pleural effusion.  IMPRESSION: No acute disease.   Electronically Signed   By: Inge Rise M.D.   On: 12/03/2014 17:47   Dg Tibia/fibula Left  12/03/2014   CLINICAL DATA:  Bilateral lower extremity swelling. Wound over the medial malleolus of the left ankle.  EXAM: LEFT TIBIA AND FIBULA - 2 VIEW  COMPARISON:  None.  FINDINGS: Wound over the medial malleolus is identified. There may be an additional wound posteriorly along the left lower leg. a No acute bony or joint abnormality is seen. Atherosclerosis is noted.  IMPRESSION: Skin wounds without underlying acute bony or joint abnormality.  Atherosclerosis.   Electronically Signed   By: Inge Rise M.D.   On: 12/03/2014 17:49   Dg Ankle Complete Left  12/03/2014   CLINICAL DATA:  Bilateral leg swelling, bilateral ankle swelling, large round necrotic sore overlying medial malleolus on left ankle  EXAM: LEFT ANKLE COMPLETE - 3+ VIEW  COMPARISON:  None.  FINDINGS: No fracture.  Ankle mortise is normally spaced and aligned.  There is a soft tissue defect extending to the surface of the bone overlying the medial malleolus. No bone resorption is seen to suggest osteomyelitis. There is no soft tissue air. No radiopaque foreign body.  Mild diffuse subcutaneous soft tissue edema is noted of the lower leg and ankle.  IMPRESSION: No fracture.  No evidence of osteomyelitis.   Electronically Signed   By: Lajean Manes M.D.   On: 12/03/2014 17:47   Dg Ankle Complete Right  12/03/2014   CLINICAL DATA:  Bilateral lower extremity swelling and pain. Initial encounter.  EXAM: RIGHT ANKLE - COMPLETE 3+ VIEW  COMPARISON:  None.  FINDINGS: Soft tissue swelling is  seen about the right ankle. No underlying acute bony or joint  abnormality is identified. No soft tissue gas collection is seen. Atherosclerosis is noted.  IMPRESSION: Soft tissue swelling.  Otherwise negative.   Electronically Signed   By: Inge Rise M.D.   On: 12/03/2014 17:50    CBC  Recent Labs Lab 12/03/14 1710 12/04/14 0418 12/05/14 0015 12/06/14 0230 12/06/14 1950 12/07/14 0236 12/07/14 1902 12/08/14 0248  WBC 15.3* 13.7* 15.2* 9.9  --  8.1  --  7.7  HGB 9.7* 9.0* 7.7* 6.6* 7.5* 7.4* 8.5* 7.9*  HCT 29.5* 28.7* 24.8* 20.5* 22.8* 22.7* 26.1* 24.1*  PLT 548* 576* 537* 452*  --  429*  --  415*  MCV 90.5 90.5 90.2 88.4  --  88.7  --  86.1  MCH 29.8 28.4 28.0 28.4  --  28.9  --  28.2  MCHC 32.9 31.4 31.0 32.2  --  32.6  --  32.8  RDW 14.5 14.6 14.9 14.6  --  14.3  --  15.5  LYMPHSABS 1.5  --   --   --   --   --   --   --   MONOABS 1.1*  --   --   --   --   --   --   --   EOSABS 0.1  --   --   --   --   --   --   --   BASOSABS 0.0  --   --   --   --   --   --   --     Chemistries   Recent Labs Lab 12/04/14 0418 12/05/14 0015 12/06/14 0230 12/07/14 0236 12/08/14 0248  NA 134* 131* 134* 132* 135  K 3.8 3.7 3.1* 3.9 3.7  CL 93* 90* 93* 92* 93*  CO2 31 31 33* 32 34*  GLUCOSE 160* 178* 134* 166* 207*  BUN 23 22 16 15 14   CREATININE 0.99 1.06 1.02 1.10 0.81  CALCIUM 8.8 8.8 8.3* 8.2* 8.3*  MG  --   --  1.4* 1.4*  --   AST 13  --   --   --   --   ALT 9  --   --   --   --   ALKPHOS 79  --   --   --   --   BILITOT 0.4  --   --   --   --    ------------------------------------------------------------------------------------------------------------------ estimated creatinine clearance is 79.9 mL/min (by C-G formula based on Cr of 0.81). ------------------------------------------------------------------------------------------------------------------ No results for input(s): HGBA1C in the last 72  hours. ------------------------------------------------------------------------------------------------------------------ No results for input(s): CHOL, HDL, LDLCALC, TRIG, CHOLHDL, LDLDIRECT in the last 72 hours. ------------------------------------------------------------------------------------------------------------------ No results for input(s): TSH, T4TOTAL, T3FREE, THYROIDAB in the last 72 hours.  Invalid input(s): FREET3 ------------------------------------------------------------------------------------------------------------------ No results for input(s): VITAMINB12, FOLATE, FERRITIN, TIBC, IRON, RETICCTPCT in the last 72 hours.  Coagulation profile  Recent Labs Lab 12/03/14 1710 12/08/14 0248  INR 0.97 1.08    No results for input(s): DDIMER in the last 72 hours.  Cardiac Enzymes  Recent Labs Lab 12/04/14 0418 12/04/14 1010 12/04/14 1538  TROPONINI 0.15* 0.10* 0.08*   ------------------------------------------------------------------------------------------------------------------ Invalid input(s): POCBNP    Nicoles Sedlacek MD. on 12/08/2014 at 2:26 PM  Between 7am to 7pm - Pager - [phone]  After 7pm go to [email or web address] - password [email or web address]  And look for the night coverage person covering for me after hours  [facility] Office  [phone]

## 2014-12-08 NOTE — Progress Notes (Signed)
[facility]  Subjective  - Pleasant alert not oriented.  At baseline.   Objective 125/58 93 98.2 F (36.8 C) (Oral) 15 95%  Intake/Output Summary (Last 24 hours) at 12/08/14 1107 Last data filed at 12/08/14 0300  Gross per 24 hour  Intake    740 ml  Output    800 ml  Net    -60 ml    Left AKA dressing changed. No active bleeding, incision clean and healing well.  Assessment/Planning: POD #2  AKA Receiving transfusion currently    Laurence Slate [facility] 12/08/2014 11:07 AM --  Laboratory Lab Results:  Recent Labs  12/07/14 0236 12/07/14 1902 12/08/14 0248  WBC 8.1  --  7.7  HGB 7.4* 8.5* 7.9*  HCT 22.7* 26.1* 24.1*  PLT 429*  --  415*   BMET  Recent Labs  12/07/14 0236 12/08/14 0248  NA 132* 135  K 3.9 3.7  CL 92* 93*  CO2 32 34*  GLUCOSE 166* 207*  BUN 15 14  CREATININE 1.10 0.81  CALCIUM 8.2* 8.3*    COAG Lab Results  Component Value Date   INR 1.08 12/08/2014   INR 0.97 12/03/2014   INR 1.04 10/29/2013   No results found for: PTT

## 2014-12-09 ENCOUNTER — Telehealth: Payer: Self-pay | Admitting: Vascular Surgery

## 2014-12-09 LAB — TYPE AND SCREEN
ABO/RH(D): A POS
ANTIBODY SCREEN: NEGATIVE
UNIT DIVISION: 0
UNIT DIVISION: 0
UNIT DIVISION: 0
Unit division: 0

## 2014-12-09 LAB — CBC
HCT: 28 % — ABNORMAL LOW (ref 39.0–52.0)
Hemoglobin: 9.2 g/dL — ABNORMAL LOW (ref 13.0–17.0)
MCH: 28.8 pg (ref 26.0–34.0)
MCHC: 32.9 g/dL (ref 30.0–36.0)
MCV: 87.8 fL (ref 78.0–100.0)
PLATELETS: 412 10*3/uL — AB (ref 150–400)
RBC: 3.19 MIL/uL — AB (ref 4.22–5.81)
RDW: 15 % (ref 11.5–15.5)
WBC: 8.7 10*3/uL (ref 4.0–10.5)

## 2014-12-09 LAB — GLUCOSE, CAPILLARY
GLUCOSE-CAPILLARY: 304 mg/dL — AB (ref 70–99)
GLUCOSE-CAPILLARY: 343 mg/dL — AB (ref 70–99)

## 2014-12-09 MED ORDER — PRO-STAT SUGAR FREE PO LIQD: 30.0000 mL | Freq: Three times a day (TID) | ORAL | Status: DC

## 2014-12-09 MED ORDER — INSULIN GLARGINE 100 UNIT/ML ~~LOC~~ SOLN: 8.0000 [IU] | Freq: Every day | SUBCUTANEOUS | Status: DC

## 2014-12-09 MED ORDER — LEVALBUTEROL HCL 1.25 MG/0.5ML IN NEBU
1.2500 mg | INHALATION_SOLUTION | RESPIRATORY_TRACT | Status: DC
  Administered 2014-12-09 (×2): 1.25 mg via RESPIRATORY_TRACT
  Filled 2014-12-09 (×2): qty 0.5

## 2014-12-09 MED ORDER — ENSURE COMPLETE PO LIQD: 237.0000 mL | Freq: Two times a day (BID) | ORAL | Status: DC

## 2014-12-09 MED ORDER — HYDROCODONE-ACETAMINOPHEN 5-325 MG PO TABS: 1.0000 | ORAL_TABLET | Freq: Four times a day (QID) | ORAL | Status: DC | PRN

## 2014-12-09 MED ORDER — INSULIN GLARGINE 100 UNIT/ML ~~LOC~~ SOLN: 15.0000 [IU] | Freq: Every day | SUBCUTANEOUS | Status: DC

## 2014-12-09 MED ORDER — INSULIN ASPART 100 UNIT/ML ~~LOC~~ SOLN: 0.0000 [IU] | Freq: Three times a day (TID) | SUBCUTANEOUS | Status: DC

## 2014-12-09 NOTE — Progress Notes (Signed)
PT noted to be pocketing his food in his mouth. Had episodes of choking while eating his food x 4.

## 2014-12-09 NOTE — Progress Notes (Signed)
Results for [name], [name] (MRN [id number]) as of 12/09/2014 13:51  Ref. Range 12/08/2014 20:58 12/09/2014 04:35 12/09/2014 07:44 12/09/2014 11:41  Glucose-Capillary Latest Range: 70-99 mg/dL 195 (H)  304 (H) 343 (H)  CBGs continue to be greater than 180 mg/dl. Getting Ensure before meal times. Recommend increasing Lantus to 12-15 units daily if CBGs continue to be elevated. (67 kg X 0.2). Will continue to follow while in hospital. Harvel Ricks RN BSN CDE

## 2014-12-09 NOTE — Progress Notes (Signed)
[facility]  Subjective  - still sore left leg   Objective 149/75 95 98.5 F (36.9 C) (Oral) 16 95%  Intake/Output Summary (Last 24 hours) at 12/09/14 0901 Last data filed at 12/09/14 0550  Gross per 24 hour  Intake    935 ml  Output   1550 ml  Net   -615 ml   Left AKA healing still with some occasional blood ooze from skin edge  Assessment/Planning: S/p AKA continue Kerlix Ace Dressing once daily Will recheck pt next week if still in hospital Otherwise needs follow up appt for staple removal in 1 month  Dorleen Kissel E 12/09/2014 9:01 AM --  Laboratory Lab Results:  Recent Labs  12/08/14 2030 12/09/14 0435  WBC 10.1 8.7  HGB 9.5* 9.2*  HCT 28.4* 28.0*  PLT 466* 412*   BMET  Recent Labs  12/07/14 0236 12/08/14 0248  NA 132* 135  K 3.9 3.7  CL 92* 93*  CO2 32 34*  GLUCOSE 166* 207*  BUN 15 14  CREATININE 1.10 0.81  CALCIUM 8.2* 8.3*    COAG Lab Results  Component Value Date   INR 1.08 12/08/2014   INR 0.97 12/03/2014   INR 1.04 10/29/2013   No results found for: PTT

## 2014-12-09 NOTE — Progress Notes (Signed)
Results for NINA, HOAR (MRN [id number]) as of 12/09/2014 09:17  Ref. Range 12/08/2014 17:31 12/08/2014 20:30 12/08/2014 20:58 12/09/2014 04:35 12/09/2014 07:44  Glucose-Capillary Latest Range: 70-99 mg/dL 393 (H)  195 (H)  304 (H)  CBGs elevated after meal yesterday at supper (Ensure) and a very early (5 am) serving today of Ensure. Recommend taking Ensure closer to meal times so that premeal CBGs will be more accurate. Will continue to follow while in hospital. Harvel Ricks RN BSN CDE

## 2014-12-09 NOTE — Discharge Summary (Signed)
Stephen [name], 74 y.o., DOB [birth year], MRN [id number]. Admission date: 12/03/2014 Discharge Date 12/09/2014 Primary MD Reynold Bowen, NT Admitting Physician Sid Falcon, MD   PCP/accepting facility physician please follow on: - Recheck CBC, BMP within 3 days. - Patient will need daily wound dressing change, need to follow with vascular surgery on 4 weeks for staples removal. - May need to repeat swallow evaluation when patient general condition is improved, and advance his diet, currently patient is being discharged on dysphagia 2 with thin liquid diet. - Patient has labile diabetes mellitus, will be discharged on sliding scale, metformin and glipizide and Lantus, please reassess and adjust dose as needed.   Diet: Dysphagia to thin liquid, carbohydrate modified, low-sodium, heart healthy.  CODE STATUS: - Patient is full code at time of discharge as discussed with wife and son.  Admission Diagnosis  Gangrene [I96] Elevated troponin [R79.89] Wound cellulitis [L03.90] Varicose veins of leg with swelling, bilateral [I83.893]  Discharge Diagnosis   Principal Problem:   Ulcer of left lower extremity Active Problems:   CORONARY ARTERY BYPASS GRAFT, HX OF   Hyperlipemia   PVD (peripheral vascular disease)   Essential hypertension   COPD (chronic obstructive pulmonary disease)   Gangrene   Pancreatic insufficiency   Cardiomyopathy, ischemic   Past Medical History  Diagnosis Date  . Diabetes mellitus   . Hypertension   . Pancreatitis   . Neuropathy   . Hyperlipemia   . Peripheral vascular disease     a. 10/2013: severely abnormal ABIs, for OP evaluation.  [name] GERD (gastroesophageal reflux disease)   . S/P colonoscopy July 2003    hyperplastic polyp, diverticulosis, anal papilla and internal hemorrhoids r  . S/P endoscopy 2003    Dr. Braulio Bosch: erosive reflux esophagitis , PUD  . PUD (peptic ulcer disease)     diagnosed via EGD by Dr. Braulio Bosch at [place]  .  Diverticulosis   . Hemorrhoids   . Coronary artery disease      a. BMS to LAD 1999. b. HSRA to LAD and stent to RCA 2007. c. NSTEMI/CABG x 3 in 04/2010. d. NSTEMI 10/2013 - secondary to demand ischemia in setting of COPD exacerbation with severe underlying CAD - cath with 3/3 patent grafts, for medical therapy.   [name] COPD (chronic obstructive pulmonary disease)     a. Ongoing tobacco abuse.  . Arthritis   . Neutropenia     a. Dx 10/2013 on labs - instructed to f/u PCP.  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   . Ischemic cardiomyopathy     a. previously EF 30% in 2011. b. 10/2013: EF 45-50% by echo.  . Alcoholism     a. Remote alcoholism  . 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    Past Surgical History  Procedure Laterality Date  . Coronary artery bypass graft  June 2011    X3  . Right inguinal hernia repair    . Cholecystectomy      3-4 years ago  . Appendectomy      age 61  . Cataracts    . Colonoscopy  05/12/2002    Dr. [name]- diverticulosis, anal papilla, internal hemorrhoids, rectal polyps  . [name] dilation  04/16/2012    Schatzki's ring-status post dilation and disruption/ as described above. Grade 1 esophageal varices. Small hiatal hernia.  [name] dilation  04/16/2012    Procedure: [name] DILATION;  Surgeon: Daneil Dolin, MD;  [facility];  Service: Endoscopy;  Laterality: N/A;  . Colonoscopy  08/10/2012    Procedure: COLONOSCOPY;  Surgeon: Daneil Dolin, MD;  [facility]: [facility];  Service: Endoscopy;  Laterality: N/A;  9:45 needs 30 mins extra /have Glucagon on hand  . Left heart catheterization with coronary/graft angiogram N/A 10/29/2013    Procedure: LEFT HEART CATHETERIZATION WITH [name];  Surgeon: Burnell Blanks, MD;  Location: [facility] CATH LAB;  Service: Cardiovascular;  Laterality: N/A;  . Abdominal aortagram  N/A 09/09/2014    Procedure: ABDOMINAL Maxcine Ham;  Surgeon: Elam Dutch, MD;  Location: [facility] CATH LAB;  Service: Cardiovascular;  Laterality: N/A;  . Atherectomy Bilateral 09/23/2014    Procedure: ATHERECTOMY;  Surgeon: Elam Dutch, MD;  Location: [facility] CATH LAB;  Service: Cardiovascular;  Laterality: Bilateral;  iliacs  . Amputation Left 12/05/2014    Procedure: AMPUTATION ABOVE KNEE;  Surgeon: Elam Dutch, MD;  Location: [facility] OR;  Service: Vascular;  Laterality: Left;     Hospital Course See H&P, Labs, Consult and Test reports for all details in brief, patient was admitted for **  Principal Problem:   Ulcer of left lower extremity Active Problems:   CORONARY ARTERY BYPASS GRAFT, HX OF   Hyperlipemia   PVD (peripheral vascular disease)   Essential hypertension   COPD (chronic obstructive pulmonary disease)   Gangrene   Pancreatic insufficiency   Cardiomyopathy, ischemic  HPI on 12/03/2014 by Dr. Linna Darner Stephen [name] is a 74 y.o. Male.Leg pain: L. Getting worse. Located in L ankle w/o radiation. Open sore and w/ purulent discharge. Open wound on ankle present for 8-9 mo. Associated w/ generalized body aches. Patient was seen by vascular surgery, with known history of severe peripheral vascular disease, with known standing and bilateral iliac systems, vascular surgery were consulted, and patient did require left above-knee amputation on 2/1 by Dr. [name], patient post surgical course was complicated by minimal oozing from surgical site, and acute blood loss anemia, where he did require multiple transfusions, patient was on dual antiplatelet therapy including Plavix and aspirin, aspirin has to be stopped giving his oozing from surgical site, hemoglobin has been stable over the last 24 hours, with no further oozing after stopping aspirin.  Peripheral Vascular Disease -patient has had stenting of the bilateral iliac systems for ulcer -Healed ulcers on right lower  extremity -Nonhealing open wounds on Left lower ext ,Vascular surgery consulted and appreciated, patient is s/p Left AKA -Was initially placed on vancomycin and zosyn- stopped on 12/06/14 - We'll continue with Plavix on discharge, full dose aspirin was stopped given patient anemia and requirement for transfusion and initial oozing from surgical site.  Anemia secondary to Acute Blood Loss - 1uPRBCs transfused, 2/2, another unit transfused 2/3, and one unit transfused 2/4. -Continue to monitor CBC, recheck CBC in 3 days from discharge.  Acute encephalopathy - Patient has mild baseline dementia, with acute delirium during hospital stay. - started on low-dose Seroquel, will discontinue on discharge giving significant improvement in mental status.  Elevated troponin -Cardiology consulted and appreciated and does not feel this is secondary to ACS. May give sublingual nitroglycerin for chest pain.  -Echocardiogram shows EF 30-35% -Continue plavix, statin, metoprolol, lisinopril , aspirin held secondary to anemia, with oozing from wound requiring multiple transfusions.  Ischemic cardiomyopathy with ejection fraction 30-35% - Appears to be euvolemic, continue with beta blockers, ACE inhibitor,  statin, Plavix.   Sepsis secondary to LLE cellulitis/nonhealing uclers -Improving, Treatment and plan as above -Leukocytosis and fever resolved  Chronic systolic and diastolic heart failure -Echocardiogram December 2014 shows EF of 72-53%, grade 1 diastolic dysfunction -Continue home regimen of Lasix, metolazone, spironolactone, metoprolol, lisinopril -Repeat Echocardiogram: EF30-35%, moderate diffuse hypokinesis  Diabetes mellitus, type II -Hemoglobin A1c 6.6 - Very labile during hospital stay, will resume back on metformin and glipizide home dose, decrease home dose Lantus on discharge as will add insulin sliding scale.   Hypertension -Continue home regimen: Metoprolol, lisinopril,  diuretics  Pancreatic insufficiency -Continue creon  COPD -Currently compensated, no wheezing -Continue Symbicort, Spiriva  Neuropathic pain -Continue Neurontin  Hyperlipidemia -Continue statin  Tobacco abuse    Consults  Vascular surgery Cardiology  Significant Tests:  See full reports for all details    Dg Chest 1 View  12/03/2014   CLINICAL DATA:  Bilateral lower extremity swelling.  EXAM: CHEST - 1 VIEW  COMPARISON:  PA and lateral chest 10/05/2014 the hand 10/23/2013.  FINDINGS: Heart size is normal. The patient is status post CABG. Lungs are clear. No pneumothorax or pleural effusion.  IMPRESSION: No acute disease.   Electronically Signed   By: Inge Rise M.D.   On: 12/03/2014 17:47   Dg Tibia/fibula Left  12/03/2014   CLINICAL DATA:  Bilateral lower extremity swelling. Wound over the medial malleolus of the left ankle.  EXAM: LEFT TIBIA AND FIBULA - 2 VIEW  COMPARISON:  None.  FINDINGS: Wound over the medial malleolus is identified. There may be an additional wound posteriorly along the left lower leg. a No acute bony or joint abnormality is seen. Atherosclerosis is noted.  IMPRESSION: Skin wounds without underlying acute bony or joint abnormality.  Atherosclerosis.   Electronically Signed   By: Inge Rise M.D.   On: 12/03/2014 17:49   Dg Ankle Complete Left  12/03/2014   CLINICAL DATA:  Bilateral leg swelling, bilateral ankle swelling, large round necrotic sore overlying medial malleolus on left ankle  EXAM: LEFT ANKLE COMPLETE - 3+ VIEW  COMPARISON:  None.  FINDINGS: No fracture.  Ankle mortise is normally spaced and aligned.  There is a soft tissue defect extending to the surface of the bone overlying the medial malleolus. No bone resorption is seen to suggest osteomyelitis. There is no soft tissue air. No radiopaque foreign body.  Mild diffuse subcutaneous soft tissue edema is noted of the lower leg and ankle.  IMPRESSION: No fracture.  No evidence of  osteomyelitis.   Electronically Signed   By: Lajean Manes M.D.   On: 12/03/2014 17:47   Dg Ankle Complete Right  12/03/2014   CLINICAL DATA:  Bilateral lower extremity swelling and pain. Initial encounter.  EXAM: RIGHT ANKLE - COMPLETE 3+ VIEW  COMPARISON:  None.  FINDINGS: Soft tissue swelling is seen about the right ankle. No underlying acute bony or joint abnormality is identified. No soft tissue gas collection is seen. Atherosclerosis is noted.  IMPRESSION: Soft tissue swelling.  Otherwise negative.   Electronically Signed   By: Inge Rise M.D.   On: 12/03/2014 17:50     Today   Subjective:   Levorn Oleski today has no headache,no chest abdominal pain,no new weakness tingling or numbness, no significant events overnight.  Objective:   Blood pressure 134/71, pulse 102, temperature 97.8 F (36.6 C), temperature source Oral, resp. rate 16, height 5\' 10"  (1.778 m), weight 67.6 kg (149 lb 0.5 oz), SpO2 98 %.  Intake/Output  Summary (Last 24 hours) at 12/09/14 1414 Last data filed at 12/09/14 1048  Gross per 24 hour  Intake    360 ml  Output   1950 ml  Net  -1590 ml    Exam  General: Well developed, malnourished appears stated age  5: NCAT, mucous membranes moist.   Cardiovascular: S1 S2 auscultated, no rubs, murmurs or gallops. Regular rate and rhythm.  Respiratory: Diminished breath sounds, rhonchi lung bases  Abdomen: Soft, nontender, nondistended, + bowel sounds  Extremities: Left AKA- with bandage, no evidence of bleed or discharge.  Neuro: Awake, confused, nonfocal,   Data Review   Cultures -   CBC w Diff:  Lab Results  Component Value Date   WBC 8.7 12/09/2014   HGB 9.2* 12/09/2014   HCT 28.0* 12/09/2014   PLT 412* 12/09/2014   LYMPHOPCT 10* 12/03/2014   MONOPCT 7 12/03/2014   EOSPCT 0 12/03/2014   BASOPCT 0 12/03/2014   CMP:  Lab Results  Component Value Date   NA 135 12/08/2014   K 3.7 12/08/2014   CL 93* 12/08/2014   CO2 34* 12/08/2014    BUN 14 12/08/2014   CREATININE 0.81 12/08/2014   PROT 6.2 12/04/2014   ALBUMIN 1.6* 12/08/2014   BILITOT 0.4 12/04/2014   ALKPHOS 79 12/04/2014   AST 13 12/04/2014   ALT 9 12/04/2014  .  Micro Results Recent Results (from the past 240 hour(s))  MRSA PCR Screening     Status: None   Collection Time: 12/03/14  9:31 PM  Result Value Ref Range Status   MRSA by PCR NEGATIVE NEGATIVE Final    Comment:        The GeneXpert MRSA Assay (FDA approved for NASAL specimens only), is one component of a comprehensive MRSA colonization surveillance program. It is not intended to diagnose MRSA infection nor to guide or monitor treatment for MRSA infections.      Discharge Instructions     CODE STATUS: Full code Change dressing daily Kerlix Ace Dressing once daily  Dysphagia 2 with thin liquid diet      Follow-up Information    Follow up with Elam Dutch, MD In 4 weeks.   Specialty:  Vascular Surgery   Why:  Office will call you to arrange your appt (sent)   Contact information:   [place] [address] [phone]       Discharge Medications     Medication List    STOP taking these medications        aspirin 325 MG tablet     doxycycline 100 MG capsule  Commonly known as:  VIBRAMYCIN      TAKE these medications        albuterol-ipratropium 18-103 MCG/ACT inhaler  Commonly known as:  COMBIVENT  Inhale 1-2 puffs into the lungs every 6 (six) hours as needed for wheezing or shortness of breath.     atorvastatin 40 MG tablet  Commonly known as:  LIPITOR  Take 40 mg by mouth daily.     clopidogrel 75 MG tablet  Commonly known as:  PLAVIX  Take 75 mg by mouth daily.     CREON 24000 UNITS Cpep  Generic drug:  Pancrelipase (Lip-Prot-Amyl)  Take 75,102-58,527 Units by mouth See admin instructions. Take 72000 units 3 times daily with all meals and take 48000 units with snacks     feeding supplement (ENSURE COMPLETE) Liqd  Take 237 mLs by mouth  2 (two) times daily between meals.  feeding supplement (PRO-STAT SUGAR FREE 64) Liqd  Take 30 mLs by mouth 3 (three) times daily with meals.     fenofibrate 160 MG tablet  Take 160 mg by mouth daily with breakfast.     furosemide 20 MG tablet  Commonly known as:  LASIX  Take 40 mg by mouth daily.     gabapentin 100 MG capsule  Commonly known as:  NEURONTIN  Take 300 mg by mouth at bedtime.     glipiZIDE 2.5 MG 24 hr tablet  Commonly known as:  GLUCOTROL XL  Take 2.5 mg by mouth daily.     HYDROcodone-acetaminophen 5-325 MG per tablet  Commonly known as:  NORCO/VICODIN  Take 1 tablet by mouth 4 (four) times daily as needed for moderate pain.     insulin aspart 100 UNIT/ML injection  Commonly known as:  novoLOG  Inject 0-15 Units into the skin 3 (three) times daily with meals.     insulin glargine 100 UNIT/ML injection  Commonly known as:  LANTUS  Inject 0.15 mLs (15 Units total) into the skin daily.     isosorbide mononitrate 30 MG 24 hr tablet  Commonly known as:  IMDUR  Take 1 tablet (30 mg total) by mouth daily.     levalbuterol 1.25 MG/3ML nebulizer solution  Commonly known as:  XOPENEX  Take 0.63 mg by nebulization every 4 (four) hours as needed for wheezing or shortness of breath.     lisinopril 20 MG tablet  Commonly known as:  PRINIVIL,ZESTRIL  Take 40 mg by mouth daily.     loratadine 10 MG tablet  Commonly known as:  CLARITIN  Take 10 mg by mouth daily.     metFORMIN 1000 MG tablet  Commonly known as:  GLUCOPHAGE  Take 1,000 mg by mouth daily.     metolazone 2.5 MG tablet  Commonly known as:  ZAROXOLYN  TAKE ONE (1) TABLET EACH DAY     metoprolol tartrate 25 MG tablet  Commonly known as:  LOPRESSOR  Take 25 mg by mouth 2 (two) times daily.     nitroGLYCERIN 0.4 MG SL tablet  Commonly known as:  NITROSTAT  Place 0.4 mg under the tongue every 5 (five) minutes x 3 doses as needed for chest pain.     pantoprazole 40 MG tablet  Commonly known  as:  PROTONIX  Take 1 tablet (40 mg total) by mouth daily.     spironolactone 25 MG tablet  Commonly known as:  ALDACTONE  Take 25 mg by mouth daily.     SYMBICORT 160-4.5 MCG/ACT inhaler  Generic drug:  budesonide-formoterol  Inhale 1-2 puffs into the lungs 2 (two) times daily as needed (shorntess of breath).     tiotropium 18 MCG inhalation capsule  Commonly known as:  SPIRIVA  Place 18 mcg into inhaler and inhale daily.         Total Time in preparing paper work, data evaluation and todays exam - 35 minutes  Ethne Jeon M.D on 12/09/2014 at 2:14 PM  [place]  [phone]

## 2014-12-09 NOTE — Evaluation (Signed)
Clinical/Bedside Swallow Evaluation Patient Details  Name: Stephen [name] MRN: [id number] Date of Birth: January 02, 1941  Today's Date: 12/09/2014 Time: SLP Start Time (ACUTE ONLY): 1145 SLP Stop Time (ACUTE ONLY): 1158 SLP Time Calculation (min) (ACUTE ONLY): 13 min  Past Medical History:  Past Medical History  Diagnosis Date  . Diabetes mellitus   . Hypertension   . Pancreatitis   . Neuropathy   . Hyperlipemia   . Peripheral vascular disease     a. 10/2013: severely abnormal ABIs, for OP evaluation.  [name] GERD (gastroesophageal reflux disease)   . S/P colonoscopy July 2003    hyperplastic polyp, diverticulosis, anal papilla and internal hemorrhoids r  . S/P endoscopy 2003    Dr. Braulio Bosch: erosive reflux esophagitis , PUD  . PUD (peptic ulcer disease)     diagnosed via EGD by Dr. Braulio Bosch at [place]  . Diverticulosis   . Hemorrhoids   . Coronary artery disease      a. BMS to LAD 1999. b. HSRA to LAD and stent to RCA 2007. c. NSTEMI/CABG x 3 in 04/2010. d. NSTEMI 10/2013 - secondary to demand ischemia in setting of COPD exacerbation with severe underlying CAD - cath with 3/3 patent grafts, for medical therapy.   [name] COPD (chronic obstructive pulmonary disease)     a. Ongoing tobacco abuse.  . Arthritis   . Neutropenia     a. Dx 10/2013 on labs - instructed to f/u PCP.  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   . Ischemic cardiomyopathy     a. previously EF 30% in 2011. b. 10/2013: EF 45-50% by echo.  . Alcoholism     a. Remote alcoholism  . 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   Past Surgical History:  Past Surgical History  Procedure Laterality Date  . Coronary artery bypass graft  June 2011    X3  . Right inguinal hernia repair    . Cholecystectomy      3-4 years ago  . Appendectomy      age 63  . Cataracts    . Colonoscopy  05/12/2002    Dr. [name]-  diverticulosis, anal papilla, internal hemorrhoids, rectal polyps  . [name] dilation  04/16/2012    Schatzki's ring-status post dilation and disruption/ as described above. Grade 1 esophageal varices. Small hiatal hernia.  [name] dilation  04/16/2012    Procedure: [name] DILATION;  Surgeon: Daneil Dolin, MD;  [facility];  Service: Endoscopy;  Laterality: N/A;  . Colonoscopy  08/10/2012    Procedure: COLONOSCOPY;  Surgeon: Daneil Dolin, MD;  [facility];  Service: Endoscopy;  Laterality: N/A;  9:45 needs 30 mins extra /have Glucagon on hand  . Left heart catheterization with coronary/graft angiogram N/A 10/29/2013    Procedure: LEFT HEART CATHETERIZATION WITH [name];  Surgeon: Burnell Blanks, MD;  Location: [facility] CATH LAB;  Service: Cardiovascular;  Laterality: N/A;  . Abdominal aortagram N/A 09/09/2014    Procedure: ABDOMINAL Maxcine Ham;  Surgeon: Elam Dutch, MD;  Location: [facility] CATH LAB;  Service: Cardiovascular;  Laterality: N/A;  . Atherectomy Bilateral 09/23/2014    Procedure: ATHERECTOMY;  Surgeon: Elam Dutch, MD;  Location: [facility] CATH LAB;  Service: Cardiovascular;  Laterality: Bilateral;  iliacs  . Amputation Left 12/05/2014    Procedure: AMPUTATION ABOVE KNEE;  Surgeon: Elam Dutch,  MD;  Location: [facility] OR;  Service: Vascular;  Laterality: Left;   HPI:  74 y.o.male with hx of PVD, pancreatic insufficiency, DM, chronic systolic and diastolic heart failure, COPD, admitted with sepsis secondary to LLE celllulitis/honhealing ulcers.  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  Extubated s/p surgery without complications.  Swallow evaluation completed 22 recommending Dys 3 diet and thin liquids with no further SLP f/u. MD requests reassessment 2/5 as pt continues to pocket food and had choking episode x4 during meal with RN.   Assessment / Plan / Recommendation Clinical Impression  Pt has a mild cognitively-based oral dysphagia with bilateral pocketing  of regular textures. SLP provided Mod cues throughout intake for sustained attention and awareness of oral residuals in order to appropriately clear. Given RN reports of functional difficulties during meals on current diet as well as pt without access to his dentures (which per pt report he typically uses for meals), recommend downgrade to Dys 2 diet, continuing thin liquids. SLP to follow briefly for tolerance.    Aspiration Risk  Mild    Diet Recommendation Dysphagia 2 (Fine chop);Thin liquid   Liquid Administration via: Cup;Straw Medication Administration: Whole meds with puree Supervision: Full supervision/cueing for compensatory strategies Compensations: Slow rate;Small sips/bites;Check for pocketing Postural Changes and/or Swallow Maneuvers: Seated upright 90 degrees    Other  Recommendations Oral Care Recommendations: Oral care BID   Follow Up Recommendations  Inpatient Rehab    Frequency and Duration min 2x/week  1 week   Pertinent Vitals/Pain n/a    SLP Swallow Goals     Swallow Study Prior Functional Status       General Date of Onset: 12/03/14 HPI: 74 y.o.male with hx of PVD, pancreatic insufficiency, DM, chronic systolic and diastolic heart failure, COPD, admitted with sepsis secondary to LLE celllulitis/honhealing ulcers.  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  Extubated s/p surgery without complications.  Swallow evaluation completed 22 recommending Dys 3 diet and thin liquids with no further SLP f/u. MD requests reassessment 2/5 as pt continues to pocket food and had choking episode x4 during meal with RN. Type of Study: Bedside swallow evaluation Previous Swallow Assessment: see HPI Diet Prior to this Study: Dysphagia 3 (soft);Thin liquids Temperature Spikes Noted: No Respiratory Status: Nasal cannula History of Recent Intubation: Yes Length of Intubations (days): 1 days Date extubated: 12/05/14 Behavior/Cognition: Cooperative;Confused;Requires cueing Oral Cavity -  Dentition: Edentulous;Dentures, not available Self-Feeding Abilities: Able to feed self Patient Positioning: Upright in chair Baseline Vocal Quality: Clear    Oral/Motor/Sensory Function Overall Oral Motor/Sensory Function: Appears within functional limits for tasks assessed   Ice Chips Ice chips: Not tested   Thin Liquid Thin Liquid: Within functional limits Presentation: Straw;Self Fed    Nectar Thick Nectar Thick Liquid: Not tested   Honey Thick Honey Thick Liquid: Not tested   Puree Puree: Within functional limits Presentation: Self Fed;Spoon   Solid   GO    Solid: Impaired Presentation: Self Fed Oral Phase Impairments: Impaired mastication;Poor awareness of bolus;Impaired anterior to posterior transit Oral Phase Functional Implications: Right lateral sulci pocketing;Left lateral sulci pocketing      Germain Osgood, M.A. CCC-SLP [phone]  Germain Osgood 12/09/2014,12:23 PM

## 2014-12-09 NOTE — Progress Notes (Signed)
Rehab admissions - I am following pt's case and unfortunately, we have no available rehab beds today or tomorrow. I have updated Hassan Rowan, case Freight forwarder and [employer], Education officer, museum and the plan is now for skilled nursing.  I spoke with pt's wife by phone to explain the situation and she was understanding. She was comfortable with pt going to SNF closer to home in [place]. I explained that [employer], social worker would be following up on those details. Pt's granddtr was not there for me to speak with as granddtr has been my primary contact.   I also updated [facility] Medicare Rn representatives of the plan for SNF.   I will now sign off pt's case and recommend that SNF be pursued as we have no open rehab beds until Monday.  Thanks.  Nanetta Batty, PT Rehabilitation Admissions Coordinator [phone]

## 2014-12-09 NOTE — Progress Notes (Signed)
Report called in to [place] ,  [place], [place].

## 2014-12-09 NOTE — Telephone Encounter (Addendum)
-----   Message from Gabriel Earing, [place] sent at 12/09/2014 12:25 PM EST ----- S/p left AKA. F/u with fields in 4 weeks.  Thanks   12/09/14: spoke with pts son, dpm

## 2014-12-09 NOTE — Progress Notes (Signed)
CSW (Clinical Social Worker) prepared pt dc packet and placed with shadow chart. CSW arranged non-emergent ambulance transport. Pt, pt family, pt nurse, and facility informed. CSW signing off.  Lexii Walsh, LCSWA [phone]  

## 2014-12-09 NOTE — Progress Notes (Signed)
SUBJECTIVE:  confused  OBJECTIVE:   Vitals:   Filed Vitals:   12/09/14 0746 12/09/14 0800 12/09/14 1022 12/09/14 1107  BP:   134/71   Pulse:   102   Temp:      TempSrc:      Resp:      Height:      Weight:      SpO2: 95% 95%  98%   I&O's:    Intake/Output Summary (Last 24 hours) at 12/09/14 1220 Last data filed at 12/09/14 1048  Gross per 24 hour  Intake    360 ml  Output   1950 ml  Net  -1590 ml   TELEMETRY: Reviewed telemetry pt in NSR:     PHYSICAL EXAM General: Well developed, well nourished, frail Head:   Normal cephalic and atramatic  Lungs:  No wheezing Heart: HRRR    Abdomen: abdomen soft and non-tender Msk:  Back normal,  Normal strength and tone for age. Extremities:  Left BKA  Neuro: confused Psych:  Normal affect, responds appropriately Skin: No rash   LABS: Basic Metabolic Panel:  Recent Labs  12/07/14 0236 12/08/14 0248  NA 132* 135  K 3.9 3.7  CL 92* 93*  CO2 32 34*  GLUCOSE 166* 207*  BUN 15 14  CREATININE 1.10 0.81  CALCIUM 8.2* 8.3*  MG 1.4*  --    Liver Function Tests:  Recent Labs  12/08/14 0248  ALBUMIN 1.6*   No results for input(s): LIPASE, AMYLASE in the last 72 hours. CBC:  Recent Labs  12/08/14 2030 12/09/14 0435  WBC 10.1 8.7  HGB 9.5* 9.2*  HCT 28.4* 28.0*  MCV 86.9 87.8  PLT 466* 412*   Cardiac Enzymes: No results for input(s): CKTOTAL, CKMB, CKMBINDEX, TROPONINI in the last 72 hours. BNP: Invalid input(s): POCBNP D-Dimer: No results for input(s): DDIMER in the last 72 hours. Hemoglobin A1C: No results for input(s): HGBA1C in the last 72 hours. Fasting Lipid Panel: No results for input(s): CHOL, HDL, LDLCALC, TRIG, CHOLHDL, LDLDIRECT in the last 72 hours. Thyroid Function Tests: No results for input(s): TSH, T4TOTAL, T3FREE, THYROIDAB in the last 72 hours.  Invalid input(s): FREET3 Anemia Panel: No results for input(s): VITAMINB12, FOLATE, FERRITIN, TIBC, IRON, RETICCTPCT in the last 72  hours. Coag Panel:   Lab Results  Component Value Date   INR 1.08 12/08/2014   INR 0.97 12/03/2014   INR 1.04 10/29/2013    RADIOLOGY: Dg Chest 1 View  12/03/2014   CLINICAL DATA:  Bilateral lower extremity swelling.  EXAM: CHEST - 1 VIEW  COMPARISON:  PA and lateral chest 10/05/2014 the hand 10/23/2013.  FINDINGS: Heart size is normal. The patient is status post CABG. Lungs are clear. No pneumothorax or pleural effusion.  IMPRESSION: No acute disease.   Electronically Signed   By: Inge Rise M.D.   On: 12/03/2014 17:47   Dg Tibia/fibula Left  12/03/2014   CLINICAL DATA:  Bilateral lower extremity swelling. Wound over the medial malleolus of the left ankle.  EXAM: LEFT TIBIA AND FIBULA - 2 VIEW  COMPARISON:  None.  FINDINGS: Wound over the medial malleolus is identified. There may be an additional wound posteriorly along the left lower leg. a No acute bony or joint abnormality is seen. Atherosclerosis is noted.  IMPRESSION: Skin wounds without underlying acute bony or joint abnormality.  Atherosclerosis.   Electronically Signed   By: Inge Rise M.D.   On: 12/03/2014 17:49   Dg Ankle Complete Left  12/03/2014   CLINICAL DATA:  Bilateral leg swelling, bilateral ankle swelling, large round necrotic sore overlying medial malleolus on left ankle  EXAM: LEFT ANKLE COMPLETE - 3+ VIEW  COMPARISON:  None.  FINDINGS: No fracture.  Ankle mortise is normally spaced and aligned.  There is a soft tissue defect extending to the surface of the bone overlying the medial malleolus. No bone resorption is seen to suggest osteomyelitis. There is no soft tissue air. No radiopaque foreign body.  Mild diffuse subcutaneous soft tissue edema is noted of the lower leg and ankle.  IMPRESSION: No fracture.  No evidence of osteomyelitis.   Electronically Signed   By: Lajean Manes M.D.   On: 12/03/2014 17:47   Dg Ankle Complete Right  12/03/2014   CLINICAL DATA:  Bilateral lower extremity swelling and pain. Initial  encounter.  EXAM: RIGHT ANKLE - COMPLETE 3+ VIEW  COMPARISON:  None.  FINDINGS: Soft tissue swelling is seen about the right ankle. No underlying acute bony or joint abnormality is identified. No soft tissue gas collection is seen. Atherosclerosis is noted.  IMPRESSION: Soft tissue swelling.  Otherwise negative.   Electronically Signed   By: Inge Rise M.D.   On: 12/03/2014 17:50      ASSESSMENT:/PLAN: Ischemic cardiomyopathy:  2D echo 12/05/14: mod-severely reduced LV function, EF 30-35%, moderate diffuse HK with severe HK of inferior MK, mild-mod MR. EF down from prior echo (EF by echo 45-50% in 10/2013 but EF by cath in 10/2013 was 40% with 3/3 patent bypass grafts - cath done at that time for + troponin in setting of COPD exacerbation).   Cr stable. Would give additional lasix and potassium if RBCs given. Hbg better now.   Appears euvolemic.  Having some stump pain.  Back on cardiac meds. Will sign off. Please call with questions.   Stephen Booze, MD  12/09/2014  12:20 PM

## 2014-12-09 NOTE — Clinical Social Work Placement (Addendum)
    Clinical Social Work Department CLINICAL SOCIAL WORK PLACEMENT NOTE 12/09/2014  Patient:  SHELLY, SHOULTZ  Account Number:  [id number] Admit date:  12/03/2014  Clinical Social Worker:  Berton Mount, Latanya Presser  Date/time:  12/09/2014 12:13 PM  Clinical Social Work is seeking post-discharge placement for this patient at the following level of care:   SKILLED NURSING   (*CSW will update this form in Epic as items are completed)   12/09/2014  Patient/family provided with [facility] Department of Clinical Social Work's list of facilities offering this level of care within the geographic area requested by the patient (or if unable, by the patient's family).  12/09/2014  Patient/family informed of their freedom to choose among providers that offer the needed level of care, that participate in Medicare, Medicaid or managed care program needed by the patient, have an available bed and are willing to accept the patient.  12/09/2014  Patient/family informed of [facility]' ownership interest in [facility], as well as of the fact that they are under no obligation to receive care at this facility.  PASARR submitted to EDS on 12/09/2014 PASARR number received on 12/09/2014  FL2 transmitted to all facilities in geographic area requested by pt/family on  12/09/2014 FL2 transmitted to all facilities within larger geographic area on   Patient informed that his/her managed care company has contracts with or will negotiate with  certain facilities, including the following:     Patient/family informed of bed offers received:  12/09/2014 Patient chooses bed at [facility] Physician recommends and patient chooses bed at    Patient to be transferred [facility]  on 12/09/2014  Patient to be transferred to facility by [facility] Patient and family notified of transfer on 12/09/2014 Name of family member notified:  Adrin Julian (wife)  The following physician request were entered in  Epic: Physician Request  Please sign FL2.    Additional CommentsBerton Mount, [place]

## 2014-12-09 NOTE — Clinical Social Work Psychosocial (Signed)
     Clinical Social Work Department BRIEF PSYCHOSOCIAL ASSESSMENT 12/09/2014  Patient:  Stephen [name], Stephen [name]     Account Number:  [id number]     Admit date:  12/03/2014  Clinical Social Worker:  Adair Laundry  Date/Time:  12/09/2014 11:48 AM  Referred by:  Physician  Date Referred:  12/09/2014 Referred for  SNF Placement   Other Referral:   Interview type:  Patient Other interview type:    PSYCHOSOCIAL DATA Living Status:  WIFE Admitted from facility:   Level of care:  [place] Primary support name:  Stephen [name] Primary support relationship to patient:  SPOUSE Degree of support available:   Pt has good family support    CURRENT CONCERNS Current Concerns  Post-Acute Placement   Other Concerns:    SOCIAL WORK ASSESSMENT / PLAN CSW visited pt room and spoke to pt about plan for dc to SNF today. Pt is agreeable to discharging to ST rehab and informed CSW they would want a facility in [facility]. However, CSW unsure if pt comprehending conversation because pt continued to repeat information. CSW asked if pt would be agreeable to [place] speaking with his wife about plan as well. Pt agreeable to this. CSW called pt wife and she informed CSW she is agreeable to pt discharging to SNF and requesting a facility in [place] or [place]. Pt wife has no facility preference but did mention that she has heard of [facility] before. Pt wife expressed that she is glad pt has recovered enough to move out of hospital setting.   Assessment/plan status:  Psychosocial Support/Ongoing Assessment of Needs Other assessment/ plan:   Information/referral to community resources:   SNF list to be provided with bed offers    PATIENTS/FAMILYS RESPONSE TO PLAN OF CARE: Pt and pt family agreeable to SNF placement.      [place], [place]

## 2014-12-09 NOTE — Care Management Note (Signed)
    Page 1 of 1   12/09/2014     2:13:50 PM CARE MANAGEMENT NOTE 12/09/2014  Patient:  Stephen [name], Stephen [name]   Account Number:  [id number]  Date Initiated:  12/06/2014  Documentation initiated by:  Marvetta Gibbons  Subjective/Objective Assessment:   Pt admitted with left leg infection- s/p amputation     Action/Plan:   PTA pt lived at home- PT/OT evals, CIR consulted- NCM to follow for recommendations   Anticipated DC Date:  12/08/2014   Anticipated DC Plan:  IP REHAB FACILITY  In-house referral  Clinical Social Worker      DC Planning Services  CM consult      Choice offered to / List presented to:             Status of service:  Completed, signed off Medicare Important Message given?  YES (If response is "NO", the following Medicare IM given date fields will be blank) Date Medicare IM given:  12/07/2014 Medicare IM given by:  Marvetta Gibbons Date Additional Medicare IM given:  12/09/2014 Additional Medicare IM given by:  Garcia Dalzell GRAVES-BIGELOW  Discharge Disposition:  [place]  Per UR Regulation:  Reviewed for med. necessity/level of care/duration of stay  If discussed at [place] of Stay Meetings, dates discussed:    Comments:   12-09-14 [place], RN,BSN [phone] Plan for d/c to SNF. CSW assisting with disposition needs.   12/07/14- 1100- Marvetta Gibbons RN, BSN [phone] CIR has been consulted and per note in [place] with insurance- pt/family agreeable to CIR- pt will need rehab prior to returning home- CIR vs SNF.

## 2014-12-09 NOTE — Progress Notes (Signed)
Physical Therapy Treatment Patient Details Name: Stephen [name] MRN: [id number] DOB: [birth year] Today's Date: 12/09/2014    History of Present Illness pt with L AKA and hx of deconditioning PTA.      PT Comments    Pt more alert and participating well today.  Continues to require 2 person A for OOB mobility.  Spoke with RN about pt needing full supervision with meals as the Swallow Precaution wasn't hanging in pt's room as it had on previous unit.  RN to f/u.  Will continue to follow.    Follow Up Recommendations  CIR     Equipment Recommendations  None recommended by PT    Recommendations for Other Services       Precautions / Restrictions Precautions Precautions: Fall Restrictions Weight Bearing Restrictions: No    Mobility  Bed Mobility Overal bed mobility: Needs Assistance;+2 for physical assistance Bed Mobility: Supine to Sit     Supine to sit: Max assist     General bed mobility comments: cues for initiation and participation.  pt able to A with coming to sitting, but does still require extensive A.    Transfers Overall transfer level: Needs assistance Equipment used: 2 person hand held assist Transfers: Set designer Transfers;Sit to/from Stand Sit to Stand: Max assist;+2 physical assistance   Squat pivot transfers: Max assist;+2 physical assistance     General transfer comment: pt contineus to have difficulty obtaining an upright posture and remains in more of a squat position for pivot to chiar.    Ambulation/Gait                 Stairs            Wheelchair Mobility    Modified Rankin (Stroke Patients Only)       Balance                                    Cognition Arousal/Alertness: Awake/alert Behavior During Therapy: WFL for tasks assessed/performed Overall Cognitive Status: Impaired/Different from baseline (Unsure of baseline) Area of Impairment: Orientation;Attention;Memory;Following  commands;Safety/judgement;Awareness;Problem solving Orientation Level: Disoriented to;Time;Place Current Attention Level: Sustained Memory: Decreased short-term memory Following Commands: Follows one step commands inconsistently;Follows one step commands with increased time Safety/Judgement: Decreased awareness of safety;Decreased awareness of deficits Awareness: Intellectual Problem Solving: Slow processing;Decreased initiation;Requires verbal cues;Requires tactile cues General Comments: pt with much improved affect today and more alert.  pt participating in conversation and does follow some directions.      Exercises      General Comments        Pertinent Vitals/Pain Pain Assessment: Faces Faces Pain Scale: Hurts a little bit Pain Location: L residual limb Pain Intervention(s): Monitored during session;Premedicated before session;Repositioned    Home Living                      Prior Function            PT Goals (current goals can now be found in the care plan section) Acute Rehab PT Goals Patient Stated Goal: go home PT Goal Formulation: With patient Time For Goal Achievement: 12/20/14 Potential to Achieve Goals: Good Progress towards PT goals: Progressing toward goals    Frequency  Min 3X/week    PT Plan Current plan remains appropriate    Co-evaluation             End of Session Equipment Utilized During  Treatment: Gait belt Activity Tolerance: Patient tolerated treatment well Patient left: in chair;with call bell/phone within reach;with chair alarm set;with nursing/sitter in room     Time: 2761-4709 PT Time Calculation (min) (ACUTE ONLY): 15 min  Charges:  $Therapeutic Activity: 8-22 mins                    G CodesCatarina Hartshorn, [place] 12/09/2014, 11:04 AM

## 2014-12-09 NOTE — Discharge Instructions (Signed)
Follow with Primary MD Reynold Bowen, NT or accepting physician at skilled nursing facility in 3 days ,  Get CBC, CMP, checked  in 3 days.   Activity: PT/ OT   Disposition SNF   Diet: dysphagia 2 with thin liquids  , with feeding assistance and aspiration precautions as needed.  For Heart failure patients - Check your Weight same time everyday, if you gain over 2 pounds, or you develop in leg swelling, experience more shortness of breath or chest pain, call your Primary MD immediately. Follow Cardiac Low Salt Diet and 1.8 lit/day fluid restriction.   On your next visit with your primary care physician please Get Medicines reviewed and adjusted.   Please request your Prim.MD to go over all Hospital Tests and Procedure/Radiological results at the follow up, please get all Hospital records sent to your Prim MD by signing hospital release before you go home.   If you experience worsening of your admission symptoms, develop shortness of breath, life threatening emergency, suicidal or homicidal thoughts you must seek medical attention immediately by calling 911 or calling your MD immediately  if symptoms less severe.  You Must read complete instructions/literature along with all the possible adverse reactions/side effects for all the Medicines you take and that have been prescribed to you. Take any new Medicines after you have completely understood and accpet all the possible adverse reactions/side effects.   Do not drive, operating heavy machinery, perform activities at heights, swimming or participation in water activities or provide baby sitting services if your were admitted for syncope or siezures until you have seen by Primary MD or a Neurologist and advised to do so again.  Do not drive when taking Pain medications.    Do not take more than prescribed Pain, Sleep and Anxiety Medications  Special Instructions: If you have smoked or chewed Tobacco  in the last 2 yrs please stop  smoking, stop any regular Alcohol  and or any Recreational drug use.  Wear Seat belts while driving.   Please note  You were cared for by a hospitalist during your hospital stay. If you have any questions about your discharge medications or the care you received while you were in the hospital after you are discharged, you can call the unit and asked to speak with the hospitalist on call if the hospitalist that took care of you is not available. Once you are discharged, your primary care physician will handle any further medical issues. Please note that NO REFILLS for any discharge medications will be authorized once you are discharged, as it is imperative that you return to your primary care physician (or establish a relationship with a primary care physician if you do not have one) for your aftercare needs so that they can reassess your need for medications and monitor your lab values.

## 2014-12-12 LAB — GLUCOSE, CAPILLARY
GLUCOSE-CAPILLARY: 152 mg/dL — AB (ref 70–99)
Glucose-Capillary: 147 mg/dL — ABNORMAL HIGH (ref 70–99)

## 2014-12-22 ENCOUNTER — Ambulatory Visit: Payer: Medicare Other | Admitting: Vascular Surgery

## 2014-12-30 ENCOUNTER — Emergency Department (HOSPITAL_COMMUNITY): Payer: Medicare Other

## 2014-12-30 ENCOUNTER — Inpatient Hospital Stay (HOSPITAL_COMMUNITY)
Admission: EM | Admit: 2014-12-30 | Discharge: 2014-12-31 | DRG: 191 | Disposition: A | Discharge status: Home / Self Care | Payer: Medicare Other | Attending: Internal Medicine | Admitting: Internal Medicine

## 2014-12-30 ENCOUNTER — Encounter (HOSPITAL_COMMUNITY): Payer: Self-pay

## 2014-12-30 ENCOUNTER — Telehealth: Payer: Self-pay | Admitting: *Deleted

## 2014-12-30 DIAGNOSIS — Z9981 Dependence on supplemental oxygen: Secondary | ICD-10-CM

## 2014-12-30 DIAGNOSIS — R778 Other specified abnormalities of plasma proteins: Secondary | ICD-10-CM | POA: Diagnosis present

## 2014-12-30 DIAGNOSIS — Z794 Long term (current) use of insulin: Secondary | ICD-10-CM

## 2014-12-30 DIAGNOSIS — Z89612 Acquired absence of left leg above knee: Secondary | ICD-10-CM

## 2014-12-30 DIAGNOSIS — J9611 Chronic respiratory failure with hypoxia: Secondary | ICD-10-CM | POA: Diagnosis present

## 2014-12-30 DIAGNOSIS — Z951 Presence of aortocoronary bypass graft: Secondary | ICD-10-CM

## 2014-12-30 DIAGNOSIS — Z79891 Long term (current) use of opiate analgesic: Secondary | ICD-10-CM

## 2014-12-30 DIAGNOSIS — Z7951 Long term (current) use of inhaled steroids: Secondary | ICD-10-CM

## 2014-12-30 DIAGNOSIS — J441 Chronic obstructive pulmonary disease with (acute) exacerbation: Principal | ICD-10-CM | POA: Diagnosis present

## 2014-12-30 DIAGNOSIS — R0602 Shortness of breath: Secondary | ICD-10-CM | POA: Diagnosis present

## 2014-12-30 DIAGNOSIS — Z87730 Personal history of (corrected) cleft lip and palate: Secondary | ICD-10-CM

## 2014-12-30 DIAGNOSIS — E785 Hyperlipidemia, unspecified: Secondary | ICD-10-CM | POA: Diagnosis present

## 2014-12-30 DIAGNOSIS — K219 Gastro-esophageal reflux disease without esophagitis: Secondary | ICD-10-CM | POA: Diagnosis present

## 2014-12-30 DIAGNOSIS — I739 Peripheral vascular disease, unspecified: Secondary | ICD-10-CM | POA: Diagnosis present

## 2014-12-30 DIAGNOSIS — I259 Chronic ischemic heart disease, unspecified: Secondary | ICD-10-CM | POA: Diagnosis present

## 2014-12-30 DIAGNOSIS — Z833 Family history of diabetes mellitus: Secondary | ICD-10-CM

## 2014-12-30 DIAGNOSIS — E1165 Type 2 diabetes mellitus with hyperglycemia: Secondary | ICD-10-CM | POA: Diagnosis present

## 2014-12-30 DIAGNOSIS — R7989 Other specified abnormal findings of blood chemistry: Secondary | ICD-10-CM

## 2014-12-30 DIAGNOSIS — I2510 Atherosclerotic heart disease of native coronary artery without angina pectoris: Secondary | ICD-10-CM | POA: Diagnosis present

## 2014-12-30 DIAGNOSIS — I255 Ischemic cardiomyopathy: Secondary | ICD-10-CM | POA: Diagnosis present

## 2014-12-30 DIAGNOSIS — E138 Other specified diabetes mellitus with unspecified complications: Secondary | ICD-10-CM | POA: Diagnosis present

## 2014-12-30 DIAGNOSIS — Z8249 Family history of ischemic heart disease and other diseases of the circulatory system: Secondary | ICD-10-CM

## 2014-12-30 DIAGNOSIS — I252 Old myocardial infarction: Secondary | ICD-10-CM

## 2014-12-30 DIAGNOSIS — I10 Essential (primary) hypertension: Secondary | ICD-10-CM | POA: Diagnosis present

## 2014-12-30 DIAGNOSIS — I5042 Chronic combined systolic (congestive) and diastolic (congestive) heart failure: Secondary | ICD-10-CM | POA: Diagnosis present

## 2014-12-30 DIAGNOSIS — D649 Anemia, unspecified: Secondary | ICD-10-CM | POA: Diagnosis present

## 2014-12-30 DIAGNOSIS — F17210 Nicotine dependence, cigarettes, uncomplicated: Secondary | ICD-10-CM | POA: Diagnosis present

## 2014-12-30 DIAGNOSIS — M1990 Unspecified osteoarthritis, unspecified site: Secondary | ICD-10-CM | POA: Diagnosis present

## 2014-12-30 DIAGNOSIS — Z7902 Long term (current) use of antithrombotics/antiplatelets: Secondary | ICD-10-CM

## 2014-12-30 DIAGNOSIS — D6489 Other specified anemias: Secondary | ICD-10-CM

## 2014-12-30 LAB — BASIC METABOLIC PANEL
ANION GAP: 6 (ref 5–15)
BUN: 30 mg/dL — AB (ref 6–23)
CO2: 29 mmol/L (ref 19–32)
CREATININE: 1.21 mg/dL (ref 0.50–1.35)
Calcium: 9 mg/dL (ref 8.4–10.5)
Chloride: 95 mmol/L — ABNORMAL LOW (ref 96–112)
GFR calc non Af Amer: 58 mL/min — ABNORMAL LOW (ref >90)
GFR, EST AFRICAN AMERICAN: 67 mL/min — AB (ref >90)
Glucose, Bld: 406 mg/dL — ABNORMAL HIGH (ref 70–99)
Potassium: 4.3 mmol/L (ref 3.5–5.1)
Sodium: 130 mmol/L — ABNORMAL LOW (ref 135–145)

## 2014-12-30 LAB — CBC WITH DIFFERENTIAL/PLATELET
BASOS PCT: 0 % (ref 0–1)
Basophils Absolute: 0 10*3/uL (ref 0.0–0.1)
Eosinophils Absolute: 0.1 10*3/uL (ref 0.0–0.7)
Eosinophils Relative: 1 % (ref 0–5)
HCT: 28.3 % — ABNORMAL LOW (ref 39.0–52.0)
HEMOGLOBIN: 9 g/dL — AB (ref 13.0–17.0)
LYMPHS ABS: 0.9 10*3/uL (ref 0.7–4.0)
Lymphocytes Relative: 9 % — ABNORMAL LOW (ref 12–46)
MCH: 28.8 pg (ref 26.0–34.0)
MCHC: 31.8 g/dL (ref 30.0–36.0)
MCV: 90.7 fL (ref 78.0–100.0)
Monocytes Absolute: 0.4 10*3/uL (ref 0.1–1.0)
Monocytes Relative: 5 % (ref 3–12)
NEUTROS ABS: 7.7 10*3/uL (ref 1.7–7.7)
NEUTROS PCT: 85 % — AB (ref 43–77)
Platelets: 327 10*3/uL (ref 150–400)
RBC: 3.12 MIL/uL — ABNORMAL LOW (ref 4.22–5.81)
RDW: 14.9 % (ref 11.5–15.5)
WBC: 9.1 10*3/uL (ref 4.0–10.5)

## 2014-12-30 LAB — GLUCOSE, CAPILLARY: Glucose-Capillary: 537 mg/dL — ABNORMAL HIGH (ref 70–99)

## 2014-12-30 LAB — URINALYSIS, ROUTINE W REFLEX MICROSCOPIC
Bilirubin Urine: NEGATIVE
Glucose, UA: >1000 mg/dL — AB
KETONES UR: NEGATIVE mg/dL
LEUKOCYTES UA: NEGATIVE
Nitrite: NEGATIVE
PH: 5.5 (ref 5.0–8.0)
Protein, ur: 100 mg/dL — AB
SPECIFIC GRAVITY, URINE: 1.015 (ref 1.005–1.030)
Urobilinogen, UA: 0.2 mg/dL (ref 0.0–1.0)

## 2014-12-30 LAB — GLUCOSE, RANDOM: Glucose, Bld: 573 mg/dL (ref 70–99)

## 2014-12-30 LAB — URINE MICROSCOPIC-ADD ON

## 2014-12-30 LAB — TROPONIN I
TROPONIN I: 0.09 ng/mL — AB (ref <0.031)
Troponin I: 0.13 ng/mL — ABNORMAL HIGH (ref <0.031)

## 2014-12-30 LAB — BRAIN NATRIURETIC PEPTIDE: B Natriuretic Peptide: 293 pg/mL — ABNORMAL HIGH (ref 0.0–100.0)

## 2014-12-30 LAB — CBG MONITORING, ED: Glucose-Capillary: 388 mg/dL — ABNORMAL HIGH (ref 70–99)

## 2014-12-30 MED ORDER — HEPARIN SODIUM (PORCINE) 5000 UNIT/ML IJ SOLN
5000.0000 [IU] | Freq: Three times a day (TID) | INTRAMUSCULAR | Status: DC
  Administered 2014-12-30: 5000 [IU] via SUBCUTANEOUS
  Filled 2014-12-30 (×2): qty 1

## 2014-12-30 MED ORDER — AZITHROMYCIN 250 MG PO TABS
500.0000 mg | ORAL_TABLET | Freq: Every day | ORAL | Status: AC
  Administered 2014-12-30: 500 mg via ORAL
  Filled 2014-12-30: qty 2

## 2014-12-30 MED ORDER — LEVALBUTEROL HCL 0.63 MG/3ML IN NEBU
0.6300 mg | INHALATION_SOLUTION | RESPIRATORY_TRACT | Status: DC
  Administered 2014-12-30 (×2): 0.63 mg via RESPIRATORY_TRACT
  Filled 2014-12-30 (×2): qty 3

## 2014-12-30 MED ORDER — SODIUM CHLORIDE 0.9 % IV SOLN: 250.0000 mL | INTRAVENOUS | Status: DC | PRN

## 2014-12-30 MED ORDER — ACETAMINOPHEN 325 MG PO TABS: 650.0000 mg | ORAL_TABLET | Freq: Four times a day (QID) | ORAL | Status: DC | PRN

## 2014-12-30 MED ORDER — INSULIN GLARGINE 100 UNIT/ML ~~LOC~~ SOLN
20.0000 [IU] | Freq: Every day | SUBCUTANEOUS | Status: DC
  Administered 2014-12-30: 20 [IU] via SUBCUTANEOUS
  Filled 2014-12-30 (×3): qty 0.2

## 2014-12-30 MED ORDER — CLOPIDOGREL BISULFATE 75 MG PO TABS
75.0000 mg | ORAL_TABLET | Freq: Every day | ORAL | Status: DC
  Administered 2014-12-31: 75 mg via ORAL
  Filled 2014-12-30: qty 1

## 2014-12-30 MED ORDER — INSULIN ASPART 100 UNIT/ML ~~LOC~~ SOLN
0.0000 [IU] | Freq: Three times a day (TID) | SUBCUTANEOUS | Status: DC
  Administered 2014-12-31: 7 [IU] via SUBCUTANEOUS
  Administered 2014-12-31: 11 [IU] via SUBCUTANEOUS

## 2014-12-30 MED ORDER — AZITHROMYCIN 250 MG PO TABS
250.0000 mg | ORAL_TABLET | Freq: Every day | ORAL | Status: DC
  Administered 2014-12-31: 250 mg via ORAL
  Filled 2014-12-30: qty 1

## 2014-12-30 MED ORDER — PANCRELIPASE (LIP-PROT-AMYL) 12000-38000 UNITS PO CPEP
72000.0000 [IU] | ORAL_CAPSULE | Freq: Three times a day (TID) | ORAL | Status: DC
  Administered 2014-12-31 (×2): 72000 [IU] via ORAL
  Filled 2014-12-30 (×2): qty 6

## 2014-12-30 MED ORDER — PANCRELIPASE (LIP-PROT-AMYL) 12000-38000 UNITS PO CPEP: 48000.0000 [IU] | ORAL_CAPSULE | ORAL | Status: DC

## 2014-12-30 MED ORDER — ASPIRIN 81 MG PO CHEW
324.0000 mg | CHEWABLE_TABLET | Freq: Once | ORAL | Status: AC
Start: 2014-12-30 — End: 2014-12-30
  Administered 2014-12-30: 324 mg via ORAL
  Filled 2014-12-30: qty 4

## 2014-12-30 MED ORDER — ACETAMINOPHEN 650 MG RE SUPP: 650.0000 mg | Freq: Four times a day (QID) | RECTAL | Status: DC | PRN

## 2014-12-30 MED ORDER — IPRATROPIUM BROMIDE 0.02 % IN SOLN
0.5000 mg | RESPIRATORY_TRACT | Status: DC
  Administered 2014-12-30 (×2): 0.5 mg via RESPIRATORY_TRACT
  Filled 2014-12-30 (×2): qty 2.5

## 2014-12-30 MED ORDER — FUROSEMIDE 40 MG PO TABS
40.0000 mg | ORAL_TABLET | Freq: Every day | ORAL | Status: DC
  Administered 2014-12-31: 40 mg via ORAL
  Filled 2014-12-30: qty 1

## 2014-12-30 MED ORDER — HYDROCODONE-ACETAMINOPHEN 5-325 MG PO TABS
1.0000 | ORAL_TABLET | Freq: Four times a day (QID) | ORAL | Status: DC | PRN
  Administered 2014-12-31: 1 via ORAL
  Filled 2014-12-30: qty 1

## 2014-12-30 MED ORDER — NITROGLYCERIN 0.4 MG SL SUBL: 0.4000 mg | SUBLINGUAL_TABLET | SUBLINGUAL | Status: DC | PRN

## 2014-12-30 MED ORDER — GABAPENTIN 300 MG PO CAPS
300.0000 mg | ORAL_CAPSULE | Freq: Every day | ORAL | Status: DC
  Administered 2014-12-30: 300 mg via ORAL
  Filled 2014-12-30: qty 1

## 2014-12-30 MED ORDER — SODIUM CHLORIDE 0.9 % IJ SOLN: 3.0000 mL | INTRAMUSCULAR | Status: DC | PRN

## 2014-12-30 MED ORDER — SPIRONOLACTONE 25 MG PO TABS
25.0000 mg | ORAL_TABLET | Freq: Every day | ORAL | Status: DC
  Administered 2014-12-31: 25 mg via ORAL
  Filled 2014-12-30: qty 1

## 2014-12-30 MED ORDER — LISINOPRIL 10 MG PO TABS
40.0000 mg | ORAL_TABLET | Freq: Every day | ORAL | Status: DC
  Administered 2014-12-31: 40 mg via ORAL
  Filled 2014-12-30: qty 4

## 2014-12-30 MED ORDER — METHYLPREDNISOLONE SODIUM SUCC 125 MG IJ SOLR: 60.0000 mg | Freq: Two times a day (BID) | INTRAMUSCULAR | Status: DC

## 2014-12-30 MED ORDER — ATORVASTATIN CALCIUM 40 MG PO TABS: 40.0000 mg | ORAL_TABLET | Freq: Every day | ORAL | Status: DC

## 2014-12-30 MED ORDER — SODIUM CHLORIDE 0.9 % IJ SOLN
3.0000 mL | Freq: Two times a day (BID) | INTRAMUSCULAR | Status: DC
  Administered 2014-12-30 – 2014-12-31 (×2): 3 mL via INTRAVENOUS

## 2014-12-30 MED ORDER — ISOSORBIDE MONONITRATE ER 60 MG PO TB24
30.0000 mg | ORAL_TABLET | Freq: Every day | ORAL | Status: DC
  Administered 2014-12-31: 30 mg via ORAL
  Filled 2014-12-30: qty 1

## 2014-12-30 MED ORDER — PANTOPRAZOLE SODIUM 40 MG PO TBEC
40.0000 mg | DELAYED_RELEASE_TABLET | Freq: Every day | ORAL | Status: DC
  Administered 2014-12-31: 40 mg via ORAL
  Filled 2014-12-30: qty 1

## 2014-12-30 MED ORDER — INSULIN ASPART 100 UNIT/ML ~~LOC~~ SOLN: 0.0000 [IU] | Freq: Every day | SUBCUTANEOUS | Status: DC

## 2014-12-30 MED ORDER — PANCRELIPASE (LIP-PROT-AMYL) 12000-38000 UNITS PO CPEP: 24000.0000 [IU] | ORAL_CAPSULE | ORAL | Status: DC | PRN

## 2014-12-30 MED ORDER — BUDESONIDE-FORMOTEROL FUMARATE 160-4.5 MCG/ACT IN AERO
1.0000 | INHALATION_SPRAY | Freq: Two times a day (BID) | RESPIRATORY_TRACT | Status: DC | PRN
  Filled 2014-12-30: qty 6

## 2014-12-30 MED ORDER — METHYLPREDNISOLONE SODIUM SUCC 125 MG IJ SOLR
60.0000 mg | Freq: Two times a day (BID) | INTRAMUSCULAR | Status: DC
  Administered 2014-12-30 – 2014-12-31 (×2): 60 mg via INTRAVENOUS
  Filled 2014-12-30 (×2): qty 2

## 2014-12-30 MED ORDER — SODIUM CHLORIDE 0.9 % IJ SOLN: 3.0000 mL | Freq: Two times a day (BID) | INTRAMUSCULAR | Status: DC

## 2014-12-30 MED ORDER — LEVALBUTEROL HCL 0.63 MG/3ML IN NEBU: 0.6300 mg | INHALATION_SOLUTION | RESPIRATORY_TRACT | Status: DC | PRN

## 2014-12-30 MED ORDER — ONDANSETRON HCL 4 MG PO TABS: 4.0000 mg | ORAL_TABLET | Freq: Four times a day (QID) | ORAL | Status: DC | PRN

## 2014-12-30 MED ORDER — METOPROLOL TARTRATE 25 MG PO TABS
25.0000 mg | ORAL_TABLET | Freq: Two times a day (BID) | ORAL | Status: DC
  Administered 2014-12-30 – 2014-12-31 (×2): 25 mg via ORAL
  Filled 2014-12-30 (×2): qty 1

## 2014-12-30 MED ORDER — GUAIFENESIN ER 600 MG PO TB12
1200.0000 mg | ORAL_TABLET | Freq: Two times a day (BID) | ORAL | Status: DC
  Administered 2014-12-30 – 2014-12-31 (×2): 1200 mg via ORAL
  Filled 2014-12-30 (×2): qty 2

## 2014-12-30 MED ORDER — FUROSEMIDE 10 MG/ML IJ SOLN
20.0000 mg | Freq: Once | INTRAMUSCULAR | Status: AC
  Administered 2014-12-30: 20 mg via INTRAVENOUS
  Filled 2014-12-30: qty 2

## 2014-12-30 MED ORDER — ONDANSETRON HCL 4 MG/2ML IJ SOLN: 4.0000 mg | Freq: Four times a day (QID) | INTRAMUSCULAR | Status: DC | PRN

## 2014-12-30 MED ORDER — METOLAZONE 5 MG PO TABS
2.5000 mg | ORAL_TABLET | Freq: Every day | ORAL | Status: DC
  Administered 2014-12-31: 2.5 mg via ORAL
  Filled 2014-12-30: qty 1

## 2014-12-30 MED ORDER — LORATADINE 10 MG PO TABS
10.0000 mg | ORAL_TABLET | Freq: Every day | ORAL | Status: DC
  Administered 2014-12-31: 10 mg via ORAL
  Filled 2014-12-30: qty 1

## 2014-12-30 MED ORDER — INSULIN ASPART 100 UNIT/ML ~~LOC~~ SOLN
10.0000 [IU] | Freq: Once | SUBCUTANEOUS | Status: AC
  Administered 2014-12-30: 10 [IU] via SUBCUTANEOUS

## 2014-12-30 NOTE — Telephone Encounter (Signed)
I returned Stephen [name]'s triage call from 9:40 am this morning re: his pain medications. When I spoke to her though she seemed to be confused and said that he was released yesterday from the [facility] in [place]. This morning, he has fallen out of his wheelchair and hit his head. Patient is presently in the ED at [facility] being evaluated.   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 She was going to discuss his medication with the provider at [facility] prior to him being discharged. His next appt with [place] in on 01-05-15 with Dr. [name] for postop.

## 2014-12-30 NOTE — H&P (Signed)
[facility] History and Physical  Stephen [name]:[id number] DOB: August 06, 1941 DOA: 12/30/2014  Referring physician: Dr. Wyvonnia Dusky PCP: Reynold Bowen, NT   Chief Complaint: Shortness of breath  HPI: Stephen [name] is a 74 y.o. male with a complex past medical history including chronic respiratory failure on 3 L of oxygen, chronic combined systolic and diastolic congestive heart failure, COPD. He was recently hospitalized for peripheral vascular disease and underwent left above-the-knee amputation. He was discharged earlier this month. Patient reports that he didn't mean to do fairly well until this morning when he woke up with a productive cough worsening shortness of breath and wheezing. He denies any chest pain, fever, sore throat, congestion, myalgias, vomiting, diarrhea. He called 911 when his breathing did not improve. When paramedics arrived he received bronchodilator therapy which she reported significantly improved his symptoms. He was brought to the ER for evaluation where he receives steroids, breathing treatments which further improved his breathing. Workup including serum troponin was noted to be elevated at 0.13. EKG did not show any acute changes. He was seen by cardiology who recommended overnight admission for cycle troponins.   Review of Systems:  Pertinent positives as per history of present illness, otherwise negative  Past Medical History  Diagnosis Date  . Diabetes mellitus   . Hypertension   . Pancreatitis   . Neuropathy   . Hyperlipemia   . Peripheral vascular disease     a. 10/2013: severely abnormal ABIs, for OP evaluation.  [name] GERD (gastroesophageal reflux disease)   . S/P colonoscopy July 2003    hyperplastic polyp, diverticulosis, anal papilla and internal hemorrhoids r  . S/P endoscopy 2003    Dr. Braulio Bosch: erosive reflux esophagitis , PUD  . PUD (peptic ulcer disease)     diagnosed via EGD by Dr. Braulio Bosch at [place]  . Diverticulosis   .  Hemorrhoids   . Coronary artery disease      a. BMS to LAD 1999. b. HSRA to LAD and stent to RCA 2007. c. NSTEMI/CABG x 3 in 04/2010. d. NSTEMI 10/2013 - secondary to demand ischemia in setting of COPD exacerbation with severe underlying CAD - cath with 3/3 patent grafts, for medical therapy.   [name] COPD (chronic obstructive pulmonary disease)     a. Ongoing tobacco abuse.  . Arthritis   . Neutropenia     a. Dx 10/2013 on labs - instructed to f/u PCP.  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   . Ischemic cardiomyopathy     a. previously EF 30% in 2011. b. 10/2013: EF 45-50% by echo.  . Alcoholism     a. Remote alcoholism  . 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   Past Surgical History  Procedure Laterality Date  . Coronary artery bypass graft  June 2011    X3  . Right inguinal hernia repair    . Cholecystectomy      3-4 years ago  . Appendectomy      age 36  . Cataracts    . Colonoscopy  05/12/2002    Dr. [name]- diverticulosis, anal papilla, internal hemorrhoids, rectal polyps  . [name] dilation  04/16/2012    Schatzki's ring-status post dilation and disruption/ as described above. Grade 1 esophageal varices. Small hiatal hernia.  [name] dilation  04/16/2012    Procedure: [name] DILATION;  Surgeon: Cristopher Estimable  Rourk, MD;  Location: [facility];  Service: Endoscopy;  Laterality: N/A;  . Colonoscopy  08/10/2012    Procedure: COLONOSCOPY;  Surgeon: Daneil Dolin, MD;  Location: [facility];  Service: Endoscopy;  Laterality: N/A;  9:45 needs 30 mins extra /have Glucagon on hand  . Left heart catheterization with coronary/graft angiogram N/A 10/29/2013    Procedure: LEFT HEART CATHETERIZATION WITH [name];  Surgeon: Burnell Blanks, MD;  Location: [facility] CATH LAB;  Service: Cardiovascular;  Laterality: N/A;  . Abdominal aortagram N/A 09/09/2014     Procedure: ABDOMINAL Maxcine Ham;  Surgeon: Elam Dutch, MD;  Location: [facility] CATH LAB;  Service: Cardiovascular;  Laterality: N/A;  . Atherectomy Bilateral 09/23/2014    Procedure: ATHERECTOMY;  Surgeon: Elam Dutch, MD;  Location: [facility] CATH LAB;  Service: Cardiovascular;  Laterality: Bilateral;  iliacs  . Amputation Left 12/05/2014    Procedure: AMPUTATION ABOVE KNEE;  Surgeon: Elam Dutch, MD;  Location: [facility] OR;  Service: Vascular;  Laterality: Left;   Social History:  reports that he has been smoking Cigarettes.  He has a 47.25 pack-year smoking history. He has never used smokeless tobacco. He reports that he does not drink alcohol or use illicit drugs.  Allergies  Allergen Reactions  . Levaquin [Levofloxacin] Hives    Family History  Problem Relation Age of Onset  . Coronary artery disease Mother   . Coronary artery disease Father   . Diabetes Sister   . Suicidality Brother   . Cirrhosis Brother   . Colon cancer Neg Hx     Prior to Admission medications   Medication Sig Start Date End Date Taking? Authorizing Provider  albuterol-ipratropium (COMBIVENT) 18-103 MCG/ACT inhaler Inhale 1-2 puffs into the lungs every 6 (six) hours as needed for wheezing or shortness of breath. 11/02/13  Yes Dayna N Dunn, PA-C  atorvastatin (LIPITOR) 40 MG tablet Take 40 mg by mouth daily.   Yes Historical Provider, MD  fenofibrate 160 MG tablet Take 160 mg by mouth daily with breakfast.    Yes Historical Provider, MD  furosemide (LASIX) 20 MG tablet Take 40 mg by mouth daily.   Yes Historical Provider, MD  gabapentin (NEURONTIN) 100 MG capsule Take 300 mg by mouth at bedtime.  06/08/14  Yes Historical Provider, MD  glipiZIDE (GLUCOTROL XL) 2.5 MG 24 hr tablet Take 2.5 mg by mouth 2 (two) times daily.    Yes Historical Provider, MD  HYDROcodone-acetaminophen (NORCO/VICODIN) 5-325 MG per tablet Take 1 tablet by mouth 4 (four) times daily as needed for moderate pain. 12/09/14  Yes Phillips Climes, MD    insulin aspart (NOVOLOG) 100 UNIT/ML injection Inject 0-15 Units into the skin 3 (three) times daily with meals. 12/09/14  Yes Phillips Climes, MD  insulin glargine (LANTUS) 100 UNIT/ML injection Inject 0.15 mLs (15 Units total) into the skin daily. Patient taking differently: Inject 15 Units into the skin daily as needed (for elevated blood sugar levels).  12/09/14  Yes Phillips Climes, MD  levalbuterol (XOPENEX) 1.25 MG/3ML nebulizer solution Take 0.63 mg by nebulization every 4 (four) hours as needed for wheezing or shortness of breath. 07/14/14  Yes Shanker Kristeen Mans, MD  lisinopril (PRINIVIL,ZESTRIL) 20 MG tablet Take 40 mg by mouth daily.    Yes Historical Provider, MD  loratadine (CLARITIN) 10 MG tablet Take 10 mg by mouth daily.   Yes Historical Provider, MD  metFORMIN (GLUCOPHAGE) 1000 MG tablet Take 1,000 mg by mouth every morning.    Yes Historical  Provider, MD  metoprolol tartrate (LOPRESSOR) 25 MG tablet Take 25 mg by mouth 2 (two) times daily.    Yes Historical Provider, MD  nitroGLYCERIN (NITROSTAT) 0.4 MG SL tablet Place 0.4 mg under the tongue every 5 (five) minutes x 3 doses as needed for chest pain.    Yes Historical Provider, MD  Pancrelipase, Lip-Prot-Amyl, (CREON) 24000 UNITS CPEP Take 48,000-72,000 Units by mouth See admin instructions. Take 72000 units 3 times daily with all meals and take 48000 units with snacks   Yes Historical Provider, MD  pantoprazole (PROTONIX) 40 MG tablet Take 1 tablet (40 mg total) by mouth daily. 11/02/13  Yes Dayna N Dunn, PA-C  spironolactone (ALDACTONE) 25 MG tablet Take 25 mg by mouth daily.     Yes Historical Provider, MD  SYMBICORT 160-4.5 MCG/ACT inhaler Inhale 1-2 puffs into the lungs 2 (two) times daily as needed (shorntess of breath).  07/04/12  Yes Historical Provider, MD  tiotropium (SPIRIVA) 18 MCG inhalation capsule Place 18 mcg into inhaler and inhale daily.   Yes Historical Provider, MD  Amino Acids-Protein Hydrolys (FEEDING SUPPLEMENT,  PRO-STAT SUGAR FREE 64,) LIQD Take 30 mLs by mouth 3 (three) times daily with meals. 12/09/14   Phillips Climes, MD  clopidogrel (PLAVIX) 75 MG tablet Take 75 mg by mouth daily.    Historical Provider, MD  feeding supplement, ENSURE COMPLETE, (ENSURE COMPLETE) LIQD Take 237 mLs by mouth 2 (two) times daily between meals. Patient not taking: Reported on 12/30/2014 12/09/14   Phillips Climes, MD  isosorbide mononitrate (IMDUR) 30 MG 24 hr tablet Take 1 tablet (30 mg total) by mouth daily. 08/19/14   Samuella Cota, MD  metolazone (ZAROXOLYN) 2.5 MG tablet TAKE ONE (1) TABLET EACH DAY 10/03/14   Lendon Colonel, NP   Physical Exam: Filed Vitals:   12/30/14 1630 12/30/14 1700 12/30/14 1730 12/30/14 1800  BP: 159/69 150/61 147/70 161/67  Pulse: 85 91 95 97  Temp:      TempSrc:      Resp: 18  18 23   Height:      Weight:      SpO2: 100% 100% 100% 100%    Wt Readings from Last 3 Encounters:  12/30/14 68.493 kg (151 lb)  12/09/14 67.6 kg (149 lb 0.5 oz)  11/03/14 77.111 kg (170 lb)    General:  Appears calm and comfortable Eyes: PERRL, normal lids, irises & conjunctiva ENT: grossly normal hearing, lips & tongue Neck: no LAD, masses or thyromegaly Cardiovascular: RRR, no m/r/g. 1+ LE edema in RLE, L AKA. Telemetry: SR, no arrhythmias  Respiratory: fair air movement bilaterally, no wheezing. Normal respiratory effort. Abdomen: soft, ntnd Skin: no rash or induration seen on limited exam Musculoskeletal: Left lower AKA with healing incision Psychiatric: grossly normal mood and affect, speech fluent and appropriate Neurologic: grossly non-focal.          Labs on Admission:  Basic Metabolic Panel:  Recent Labs Lab 12/30/14 1406  NA 130*  K 4.3  CL 95*  CO2 29  GLUCOSE 406*  BUN 30*  CREATININE 1.21  CALCIUM 9.0   Liver Function Tests: No results for input(s): AST, ALT, ALKPHOS, BILITOT, PROT, ALBUMIN in the last 168 hours. No results for input(s): LIPASE, AMYLASE in the  last 168 hours. No results for input(s): AMMONIA in the last 168 hours. CBC:  Recent Labs Lab 12/30/14 1406  WBC 9.1  NEUTROABS 7.7  HGB 9.0*  HCT 28.3*  MCV 90.7  PLT 327  Cardiac Enzymes:  Recent Labs Lab 12/30/14 1406  TROPONINI 0.13*    BNP (last 3 results)  Recent Labs  12/30/14 1406  BNP 293.0*    ProBNP (last 3 results)  Recent Labs  07/21/14 2348 08/16/14 1351 08/17/14 0609  PROBNP 3366.0* 6508.0* 9230.0*    CBG:  Recent Labs Lab 12/30/14 1354  GLUCAP 388*    Radiological Exams on Admission: Dg Chest Portable 1 View  12/30/2014   CLINICAL DATA:  Short of breath.  Hyperglycemia  EXAM: PORTABLE CHEST - 1 VIEW  COMPARISON:  12/03/2014  FINDINGS: Postop CABG. Negative for heart failure. Negative for pneumonia or effusion. Lungs are clear.  IMPRESSION: No active disease.   Electronically Signed   By: Franchot Gallo M.D.   On: 12/30/2014 14:04    EKG: Independently reviewed. No acute changes noted, chronic LBBB  Assessment/Plan Active Problems:   Chronic ischemic heart disease   Chronic combined systolic and diastolic congestive heart failure   Secondary DM with complication   COPD exacerbation   Elevated troponin   Essential hypertension   Shortness of breath   Troponin level elevated   Chronic respiratory failure with hypoxia   Anemia   1. Elevated troponin. Possibly demand ischemia related to underlying respiratory issues. We'll continue to cycle troponins. If no significant rise in troponin level, Despite discharged home tomorrow with outpatient cardiology follow-up. Patient does not have any chest pain at this time. 2. COPD exacerbation. Continue the patient on bronchodilators, IV steroids, antibiotics and pulmonary hygiene. 3. Chronic respiratory failure. Appears to be near baseline. Continue current treatments. 4. Hypertension. We'll continue his outpatient regimen. 5. Chronic combined systolic and diastolic congestive heart failure.  Continue outpatient diuretics. Does not appear to be particularly volume overloaded. 6. Uncontrolled diabetes. We'll need to monitor this in the setting of steroid use. Continue Lantus and sliding scale insulin   Code Status: full code DVT Prophylaxis: heparin  Family Communication: no family present Disposition Plan: discharge home in AM if stable  Time spent: 23mins  Swati Granberry [facility] Pager [phone]

## 2014-12-30 NOTE — ED Notes (Signed)
Pt reports had left [facility] approx 2 weeks ago and was discharged from the [facility] yesterday>  Reports started having sob this morning.

## 2014-12-30 NOTE — ED Notes (Signed)
EMS reports called out for SOB.  Upon their arrival, pt was wheezing.  They administered albuterol enroute and 125mg  solumedrol IV.  CBG 460 per EMS.  BP 150/74.  Initial bp was 180/109.    Pt on home 02 at 3 liters.  02 sat 100% on 2liters.   Denies pain.
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
## 2014-12-30 NOTE — ED Provider Notes (Signed)
CSN: [id number]     Arrival date & time 12/30/14  1326 History  This chart was scribed for Ezequiel Essex, MD by Einar Pheasant, ED Scribe. This patient was seen in room APA02/APA02 and the patient's care was started at 1:40 PM.  Chief Complaint  Patient presents with  . Shortness of Breath  . Hyperglycemia   The history is provided by the patient and medical records. No language interpreter was used.   HPI Comments: Stephen [name] is a 74 y.o. male with PMhx of DM, COPD, DM, recent amputation, and CAD who was brought to the Emergency Department by EMS complaining of sudden onset persistent SOB that started approximately 45 minutes prior to arrival. Pt was wheezing upon EMS home arrival so he was given albuterol neb en route and 125 mg solumedrol IV, which pt states provided him relief. [place] on 3L oxygen at home. They also brought him in for hyperglycemia after recording a CBG of 460. He endorses associated productive cough with unspecified colored sputum. Pt is a smoker, but he states that he hasn't smoked in 2 weeks. Denies any chest pain, abdominal pain, rhinorrhea, sore throat, nausea, emesis, arthralgias, back pain, or weakness.  Pt was sent home from rehab yesterday.  Past Medical History  Diagnosis Date  . Diabetes mellitus   . Hypertension   . Pancreatitis   . Neuropathy   . Hyperlipemia   . Peripheral vascular disease     a. 10/2013: severely abnormal ABIs, for OP evaluation.  [name] GERD (gastroesophageal reflux disease)   . S/P colonoscopy July 2003    hyperplastic polyp, diverticulosis, anal papilla and internal hemorrhoids r  . S/P endoscopy 2003    Dr. Braulio Bosch: erosive reflux esophagitis , PUD  . PUD (peptic ulcer disease)     diagnosed via EGD by Dr. Braulio Bosch at [place]  . Diverticulosis   . Hemorrhoids   . Coronary artery disease      a. BMS to LAD 1999. b. HSRA to LAD and stent to RCA 2007. c. NSTEMI/CABG x 3 in 04/2010. d. NSTEMI 10/2013 - secondary to demand  ischemia in setting of COPD exacerbation with severe underlying CAD - cath with 3/3 patent grafts, for medical therapy.   [name] COPD (chronic obstructive pulmonary disease)     a. Ongoing tobacco abuse.  . Arthritis   . Neutropenia     a. Dx 10/2013 on labs - instructed to f/u PCP.  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   . Ischemic cardiomyopathy     a. previously EF 30% in 2011. b. 10/2013: EF 45-50% by echo.  . Alcoholism     a. Remote alcoholism  . 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   Past Surgical History  Procedure Laterality Date  . Coronary artery bypass graft  June 2011    X3  . Right inguinal hernia repair    . Cholecystectomy      3-4 years ago  . Appendectomy      age 21  . Cataracts    . Colonoscopy  05/12/2002    Dr. [name]- diverticulosis, anal papilla, internal hemorrhoids, rectal polyps  . [name] dilation  04/16/2012    Schatzki's ring-status post dilation and disruption/ as described above. Grade 1 esophageal varices. Small hiatal hernia.  [name] dilation  04/16/2012    Procedure: MALONEY DILATION;  Surgeon:  Daneil Dolin, MD;  [facility]: [facility];  Service: Endoscopy;  Laterality: N/A;  . Colonoscopy  08/10/2012    Procedure: COLONOSCOPY;  Surgeon: Daneil Dolin, MD;  Location: [facility];  Service: Endoscopy;  Laterality: N/A;  9:45 needs 30 mins extra /have Glucagon on hand  . Left heart catheterization with coronary/graft angiogram N/A 10/29/2013    Procedure: LEFT HEART CATHETERIZATION WITH [name];  Surgeon: Burnell Blanks, MD;  Location[facility] CATH LAB;  Service: Cardiovascular;  Laterality: N/A;  . Abdominal aortagram N/A 09/09/2014    Procedure: ABDOMINAL Maxcine Ham;  Surgeon: Elam Dutch, MD;  Location: [facility] CATH LAB;  Service: Cardiovascular;  Laterality: N/A;  . Atherectomy Bilateral 09/23/2014     Procedure: ATHERECTOMY;  Surgeon: Elam Dutch, MD;  Location: [facility] CATH LAB;  Service: Cardiovascular;  Laterality: Bilateral;  iliacs  . Amputation Left 12/05/2014    Procedure: AMPUTATION ABOVE KNEE;  Surgeon: Elam Dutch, MD;  Location: [facility] OR;  Service: Vascular;  Laterality: Left;   Family History  Problem Relation Age of Onset  . Coronary artery disease Mother   . Coronary artery disease Father   . Diabetes Sister   . Suicidality Brother   . Cirrhosis Brother   . Colon cancer Neg Hx    History  Substance Use Topics  . Smoking status: Current Every Day Smoker -- 0.75 packs/day for 63 years    Types: Cigarettes  . Smokeless tobacco: Never Used  . Alcohol Use: No     Comment: Quit 1985    Review of Systems A complete 10 system review of systems was obtained and all systems are negative except as noted in the HPI and PMH.   Allergies  Levaquin  Home Medications   Prior to Admission medications   Medication Sig Start Date End Date Taking? Authorizing Provider  albuterol-ipratropium (COMBIVENT) 18-103 MCG/ACT inhaler Inhale 1-2 puffs into the lungs every 6 (six) hours as needed for wheezing or shortness of breath. 11/02/13  Yes Dayna N Dunn, PA-C  atorvastatin (LIPITOR) 40 MG tablet Take 40 mg by mouth daily.   Yes Historical Provider, MD  fenofibrate 160 MG tablet Take 160 mg by mouth daily with breakfast.    Yes Historical Provider, MD  furosemide (LASIX) 20 MG tablet Take 40 mg by mouth daily.   Yes Historical Provider, MD  gabapentin (NEURONTIN) 100 MG capsule Take 300 mg by mouth at bedtime.  06/08/14  Yes Historical Provider, MD  glipiZIDE (GLUCOTROL XL) 2.5 MG 24 hr tablet Take 2.5 mg by mouth 2 (two) times daily.    Yes Historical Provider, MD  HYDROcodone-acetaminophen (NORCO/VICODIN) 5-325 MG per tablet Take 1 tablet by mouth 4 (four) times daily as needed for moderate pain. 12/09/14  Yes Phillips Climes, MD  insulin aspart (NOVOLOG) 100 UNIT/ML injection Inject  0-15 Units into the skin 3 (three) times daily with meals. 12/09/14  Yes Phillips Climes, MD  insulin glargine (LANTUS) 100 UNIT/ML injection Inject 0.15 mLs (15 Units total) into the skin daily. Patient taking differently: Inject 15 Units into the skin daily as needed (for elevated blood sugar levels).  12/09/14  Yes Phillips Climes, MD  levalbuterol (XOPENEX) 1.25 MG/3ML nebulizer solution Take 0.63 mg by nebulization every 4 (four) hours as needed for wheezing or shortness of breath. 07/14/14  Yes Shanker Kristeen Mans, MD  lisinopril (PRINIVIL,ZESTRIL) 20 MG tablet Take 40 mg by mouth daily.    Yes Historical Provider, MD  loratadine (CLARITIN) 10 MG  tablet Take 10 mg by mouth daily.   Yes Historical Provider, MD  metFORMIN (GLUCOPHAGE) 1000 MG tablet Take 1,000 mg by mouth every morning.    Yes Historical Provider, MD  metoprolol tartrate (LOPRESSOR) 25 MG tablet Take 25 mg by mouth 2 (two) times daily.    Yes Historical Provider, MD  nitroGLYCERIN (NITROSTAT) 0.4 MG SL tablet Place 0.4 mg under the tongue every 5 (five) minutes x 3 doses as needed for chest pain.    Yes Historical Provider, MD  Pancrelipase, Lip-Prot-Amyl, (CREON) 24000 UNITS CPEP Take 48,000-72,000 Units by mouth See admin instructions. Take 72000 units 3 times daily with all meals and take 48000 units with snacks   Yes Historical Provider, MD  pantoprazole (PROTONIX) 40 MG tablet Take 1 tablet (40 mg total) by mouth daily. 11/02/13  Yes Dayna N Dunn, PA-C  spironolactone (ALDACTONE) 25 MG tablet Take 25 mg by mouth daily.     Yes Historical Provider, MD  SYMBICORT 160-4.5 MCG/ACT inhaler Inhale 1-2 puffs into the lungs 2 (two) times daily as needed (shorntess of breath).  07/04/12  Yes Historical Provider, MD  tiotropium (SPIRIVA) 18 MCG inhalation capsule Place 18 mcg into inhaler and inhale daily.   Yes Historical Provider, MD  Amino Acids-Protein Hydrolys (FEEDING SUPPLEMENT, PRO-STAT SUGAR FREE 64,) LIQD Take 30 mLs by mouth 3  (three) times daily with meals. 12/09/14   Phillips Climes, MD  clopidogrel (PLAVIX) 75 MG tablet Take 75 mg by mouth daily.    Historical Provider, MD  isosorbide mononitrate (IMDUR) 30 MG 24 hr tablet Take 1 tablet (30 mg total) by mouth daily. 08/19/14   Samuella Cota, MD  metolazone (ZAROXOLYN) 2.5 MG tablet TAKE ONE (1) TABLET EACH DAY 10/03/14   Lendon Colonel, NP   BP 141/52 mmHg  Pulse 82  Temp(Src) 97.8 F (36.6 C) (Oral)  Resp 18  Ht 5\' 10"  (1.778 m)  Wt 151 lb (68.493 kg)  BMI 21.67 kg/m2  SpO2 94%   Physical Exam  Constitutional: He is oriented to person, place, and time. He appears well-developed and well-nourished. No distress.  HENT:  Head: Normocephalic and atraumatic.  Mouth/Throat: Oropharynx is clear and moist. No oropharyngeal exudate.  Eyes: Conjunctivae and EOM are normal. Pupils are equal, round, and reactive to light.  Neck: Normal range of motion. Neck supple.  No meningismus.  Cardiovascular: Normal rate, regular rhythm, normal heart sounds and intact distal pulses.   No murmur heard. Pulmonary/Chest: Effort normal. No respiratory distress. He has no wheezes. He has rhonchi.  Diminished breath sounds with rhonchi to bilateral bases.   Abdominal: Soft. There is no tenderness. There is no rebound and no guarding.  Musculoskeletal: Normal range of motion. He exhibits no edema or tenderness.  Left BKA, sutures intact. No erythema. No edema  Neurological: He is alert and oriented to person, place, and time. No cranial nerve deficit. He exhibits normal muscle tone. Coordination normal.  No ataxia on finger to nose bilaterally. No pronator drift. 5/5 strength throughout. CN 2-12 intact. Negative Romberg. Equal grip strength. Sensation intact. Gait is normal.   Skin: Skin is warm.  Psychiatric: He has a normal mood and affect. His behavior is normal.  Nursing note and vitals reviewed.   ED Course  Procedures   COORDINATION OF CARE: 1:46 PM- Pt advised  of plan for treatment and pt agrees.  Labs Review Labs Reviewed  CBC WITH DIFFERENTIAL/PLATELET - Abnormal; Notable for the following:    RBC 3.12 (*)  Hemoglobin 9.0 (*)    HCT 28.3 (*)    Neutrophils Relative % 85 (*)    Lymphocytes Relative 9 (*)    All other components within normal limits  BASIC METABOLIC PANEL - Abnormal; Notable for the following:    Sodium 130 (*)    Chloride 95 (*)    Glucose, Bld 406 (*)    BUN 30 (*)    GFR calc non Af Amer 58 (*)    GFR calc Af Amer 67 (*)    All other components within normal limits  TROPONIN I - Abnormal; Notable for the following:    Troponin I 0.13 (*)    All other components within normal limits  BRAIN NATRIURETIC PEPTIDE - Abnormal; Notable for the following:    B Natriuretic Peptide 293.0 (*)    All other components within normal limits  URINALYSIS, ROUTINE W REFLEX MICROSCOPIC - Abnormal; Notable for the following:    Glucose, UA >1000 (*)    Hgb urine dipstick SMALL (*)    Protein, ur 100 (*)    All other components within normal limits  TROPONIN I - Abnormal; Notable for the following:    Troponin I 0.09 (*)    All other components within normal limits  GLUCOSE, RANDOM - Abnormal; Notable for the following:    Glucose, Bld 573 (*)    All other components within normal limits  GLUCOSE, CAPILLARY - Abnormal; Notable for the following:    Glucose-Capillary 537 (*)    All other components within normal limits  CBG MONITORING, ED - Abnormal; Notable for the following:    Glucose-Capillary 388 (*)    All other components within normal limits  URINE MICROSCOPIC-ADD ON  TROPONIN I  TROPONIN I  BASIC METABOLIC PANEL  CBC    Imaging Review Dg Chest Portable 1 View  12/30/2014   CLINICAL DATA:  Short of breath.  Hyperglycemia  EXAM: PORTABLE CHEST - 1 VIEW  COMPARISON:  12/03/2014  FINDINGS: Postop CABG. Negative for heart failure. Negative for pneumonia or effusion. Lungs are clear.  IMPRESSION: No active disease.    Electronically Signed   By: Franchot Gallo M.D.   On: 12/30/2014 14:04     EKG Interpretation   Date/Time:  Friday December 30 2014 13:52:02 EST Ventricular Rate:  81 PR Interval:  167 QRS Duration: 141 QT Interval:  399 QTC Calculation: 463 R Axis:   -18 Text Interpretation:  Sinus rhythm Left bundle branch block Baseline  wander in lead(s) V5 No significant change was found Confirmed by Wyvonnia Dusky   MD, Marleny Faller [phone]) on 12/30/2014 2:06:02 PM      MDM   Final diagnoses:  Elevated troponin  COPD exacerbation   Shortness of breath with congestion and cough since this morning. No chest pain. Patient discharged from rehabilitation yesterday after a left AKA on February 5. States compliance with medications.  Chest x-ray negative. EKG unchanged. Hemoglobin stable. Labs show hyperglycemia without DKA. Troponin elevated 0.13. Mild troponin elevation noted during previous admission as well. Thought to be due to COPD exacerbation. Per cardiology notes ". 2D echo 12/05/14: mod-severely reduced LV function, EF 30-35%, moderate diffuse HK with severe HK of inferior MK, mild-mod MR. EF down from prior echo (45-50% in 10/2013) but cath in 10/2013 was 40% with 3/3 patent bypass grafts (cath done at that time for + troponin in setting of COPD exacerbation)."  Dr. Harl Bowie has seen patient and recommends overnight monitoring in setting of elevated troponin.  D/w Dr. Roderic Palau who will admit.  No CP in the ED.  Breathing improved and stable on home 3L [place].   I personally performed the services described in this documentation, which was scribed in my presence. The recorded information has been reviewed and is accurate.   Ezequiel Essex, MD 12/30/14 2322

## 2014-12-30 NOTE — Consult Note (Signed)
Primary cardiologist: Dr Bronson Ing Consulting cardiologist: Dr Carlyle Dolly  Clinical Summary Stephen [name] is a 74 y.o.male hx of CAD with prior CABG, severe PAD w/ multiple interventions and recent AKA, tobacco abuse, HTN, COPD, DM, HL, chronic LBBB, chronic systolic HF LVEF 49-44%,  admitted with SOB and wheezing. Symptoms reportedly improved with nebs and steroids. + productive cough with occasional sputum. No chest pain.   Frequent admissions for medical illness with notes elevated troponins.  - Last cath 10/2013 in setting of NSTEMI due to demand ischemia from COPD exacerbation, cath showed native vessel disease with 3/3 patent grafts.  -12/05/14 Echo: LVEF 30-35%, cannot eval diastolic dysfunction, mild to mod MR. LVEF 10/2013 45-50%. - 10/2013 cath: LM 99% prox, LAD prox 30%, mod ISR in mid LAD stent, LCX 50% prox, RCA 100% prox LIMA-LAD patent, SVG-PDA patent, SVG-intermediate patent. LVEF 40%.  - BNP 293, WBC 9, Hgb 9, Plt 327 Na 130 BUN 30 Gluc 400 Cr 1.21 Trop 0.13 (chronicall elevated troponins).  - CXR no acute process - EKG SR, chronic LBBB Allergies  Allergen Reactions  . Levaquin [Levofloxacin] Hives    Medications Scheduled Medications:    Infusions:    PRN Medications:     Past Medical History  Diagnosis Date  . Diabetes mellitus   . Hypertension   . Pancreatitis   . Neuropathy   . Hyperlipemia   . Peripheral vascular disease     a. 10/2013: severely abnormal ABIs, for OP evaluation.  [name] GERD (gastroesophageal reflux disease)   . S/P colonoscopy July 2003    hyperplastic polyp, diverticulosis, anal papilla and internal hemorrhoids r  . S/P endoscopy 2003    Dr. Braulio Bosch: erosive reflux esophagitis , PUD  . PUD (peptic ulcer disease)     diagnosed via EGD by Dr. Braulio Bosch at [place]  . Diverticulosis   . Hemorrhoids   . Coronary artery disease      a. BMS to LAD 1999. b. HSRA to LAD and stent to RCA 2007. c. NSTEMI/CABG x 3 in 04/2010. d.  NSTEMI 10/2013 - secondary to demand ischemia in setting of COPD exacerbation with severe underlying CAD - cath with 3/3 patent grafts, for medical therapy.   [name] COPD (chronic obstructive pulmonary disease)     a. Ongoing tobacco abuse.  . Arthritis   . Neutropenia     a. Dx 10/2013 on labs - instructed to f/u PCP.  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   . Ischemic cardiomyopathy     a. previously EF 30% in 2011. b. 10/2013: EF 45-50% by echo.  . Alcoholism     a. Remote alcoholism  . 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    Past Surgical History  Procedure Laterality Date  . Coronary artery bypass graft  June 2011    X3  . Right inguinal hernia repair    . Cholecystectomy      3-4 years ago  . Appendectomy      age 75  . Cataracts    . Colonoscopy  05/12/2002    Dr. [name]- diverticulosis, anal papilla, internal hemorrhoids, rectal polyps  . [name] dilation  04/16/2012    Schatzki's ring-status post dilation and disruption/ as described above. Grade 1 esophageal varices. Small hiatal hernia.  [name] dilation  04/16/2012    Procedure: [name] DILATION;  Surgeon: Herbie Baltimore  Hilton Cork, MD;  [facility];  Service: Endoscopy;  Laterality: N/A;  . Colonoscopy  08/10/2012    Procedure: COLONOSCOPY;  Surgeon: Daneil Dolin, MD;  [facility];  Service: Endoscopy;  Laterality: N/A;  9:45 needs 30 mins extra /have Glucagon on hand  . Left heart catheterization with coronary/graft angiogram N/A 10/29/2013    Procedure: LEFT HEART CATHETERIZATION WITH [name];  Surgeon: Burnell Blanks, MD;  Location: [facility] CATH LAB;  Service: Cardiovascular;  Laterality: N/A;  . Abdominal aortagram N/A 09/09/2014    Procedure: ABDOMINAL Maxcine Ham;  Surgeon: Elam Dutch, MD;  Location: [facility] CATH LAB;  Service: Cardiovascular;  Laterality: N/A;  .  Atherectomy Bilateral 09/23/2014    Procedure: ATHERECTOMY;  Surgeon: Elam Dutch, MD;  Location: [facility] CATH LAB;  Service: Cardiovascular;  Laterality: Bilateral;  iliacs  . Amputation Left 12/05/2014    Procedure: AMPUTATION ABOVE KNEE;  Surgeon: Elam Dutch, MD;  Location: [facility] OR;  Service: Vascular;  Laterality: Left;    Family History  Problem Relation Age of Onset  . Coronary artery disease Mother   . Coronary artery disease Father   . Diabetes Sister   . Suicidality Brother   . Cirrhosis Brother   . Colon cancer Neg Hx     Social History Stephen [name] reports that he has been smoking Cigarettes.  He has a 47.25 pack-year smoking history. He has never used smokeless tobacco. Stephen [name] reports that he does not drink alcohol.  Review of Systems CONSTITUTIONAL: No weight loss, fever, chills, weakness or fatigue.  HEENT: Eyes: No visual loss, blurred vision, double vision or yellow sclerae. No hearing loss, sneezing, congestion, runny nose or sore throat.  SKIN: No rash or itching.  CARDIOVASCULAR: No chest pain, chest pressure or chest discomfort. No palpitations or edema.  RESPIRATORY: per HPI.  GASTROINTESTINAL: No anorexia, nausea, vomiting or diarrhea. No abdominal pain or blood.  GENITOURINARY: no polyuria, no dysuria NEUROLOGICAL: No headache, dizziness, syncope, paralysis, ataxia, numbness or tingling in the extremities. No change in bowel or bladder control.  MUSCULOSKELETAL: No muscle, back pain, joint pain or stiffness.  HEMATOLOGIC: No anemia, bleeding or bruising.  LYMPHATICS: No enlarged nodes. No history of splenectomy.  PSYCHIATRIC: No history of depression or anxiety.      Physical Examination Blood pressure 156/86, pulse 89, temperature 97.8 F (36.6 C), temperature source Oral, resp. rate 14, height 5\' 10"  (1.778 m), weight 151 lb (68.493 kg), SpO2 100 %. No intake or output data in the 24 hours ending 12/30/14 1605  HEENT: sclera  clear  Cardiovascular: RRR, no m/r/g, no JVD,   Respiratory: expiratory wheezing  GI: abdomen soft, NT, ND  MSK: left AKI, no LE edema  Neuro: no focal deficits  Psych: appropriate affect  Lab Results  Basic Metabolic Panel:  Recent Labs Lab 12/30/14 1406  NA 130*  K 4.3  CL 95*  CO2 29  GLUCOSE 406*  BUN 30*  CREATININE 1.21  CALCIUM 9.0    Liver Function Tests: No results for input(s): AST, ALT, ALKPHOS, BILITOT, PROT, ALBUMIN in the last 168 hours.  CBC:  Recent Labs Lab 12/30/14 1406  WBC 9.1  NEUTROABS 7.7  HGB 9.0*  HCT 28.3*  MCV 90.7  PLT 327    Cardiac Enzymes:  Recent Labs Lab 12/30/14 1406  TROPONINI 0.13*    BNP: Invalid input(s): POCBNP     Impression/Recommendations 1. SOB - symptoms consistent with COPD exacerbation, already much improved  after nebs and IV steroids - no pulm edema cxr, exam without significant signs of heart failure. Mildly elevated BNP but suspect may be chronic, do not see strong indication for diuresis  2. Elevated troponin - long history of chronically elevated troponins, often in the setting of medical illness including COPD exacerbation. Chronic LBBB. - last cath 2014 with patent grafts - no chest pain. SOB improved with COPD management, does not appear to be cardiac related - would admit to medicine overnight and cycle enzymes, unless significant elevation would not plan for any further cardiac testing.    Carlyle Dolly, M.D.

## 2014-12-31 DIAGNOSIS — F17210 Nicotine dependence, cigarettes, uncomplicated: Secondary | ICD-10-CM | POA: Diagnosis present

## 2014-12-31 DIAGNOSIS — K219 Gastro-esophageal reflux disease without esophagitis: Secondary | ICD-10-CM | POA: Diagnosis present

## 2014-12-31 DIAGNOSIS — I10 Essential (primary) hypertension: Secondary | ICD-10-CM | POA: Diagnosis present

## 2014-12-31 DIAGNOSIS — I739 Peripheral vascular disease, unspecified: Secondary | ICD-10-CM | POA: Diagnosis present

## 2014-12-31 DIAGNOSIS — J441 Chronic obstructive pulmonary disease with (acute) exacerbation: Secondary | ICD-10-CM | POA: Diagnosis present

## 2014-12-31 DIAGNOSIS — D649 Anemia, unspecified: Secondary | ICD-10-CM | POA: Diagnosis present

## 2014-12-31 DIAGNOSIS — R0602 Shortness of breath: Secondary | ICD-10-CM | POA: Diagnosis present

## 2014-12-31 DIAGNOSIS — Z9981 Dependence on supplemental oxygen: Secondary | ICD-10-CM | POA: Diagnosis not present

## 2014-12-31 DIAGNOSIS — Z87730 Personal history of (corrected) cleft lip and palate: Secondary | ICD-10-CM | POA: Diagnosis not present

## 2014-12-31 DIAGNOSIS — Z7951 Long term (current) use of inhaled steroids: Secondary | ICD-10-CM | POA: Diagnosis not present

## 2014-12-31 DIAGNOSIS — Z7902 Long term (current) use of antithrombotics/antiplatelets: Secondary | ICD-10-CM | POA: Diagnosis not present

## 2014-12-31 DIAGNOSIS — I2510 Atherosclerotic heart disease of native coronary artery without angina pectoris: Secondary | ICD-10-CM | POA: Diagnosis present

## 2014-12-31 DIAGNOSIS — E1165 Type 2 diabetes mellitus with hyperglycemia: Secondary | ICD-10-CM | POA: Diagnosis present

## 2014-12-31 DIAGNOSIS — E785 Hyperlipidemia, unspecified: Secondary | ICD-10-CM | POA: Diagnosis present

## 2014-12-31 DIAGNOSIS — Z951 Presence of aortocoronary bypass graft: Secondary | ICD-10-CM | POA: Diagnosis not present

## 2014-12-31 DIAGNOSIS — M1990 Unspecified osteoarthritis, unspecified site: Secondary | ICD-10-CM | POA: Diagnosis present

## 2014-12-31 DIAGNOSIS — I255 Ischemic cardiomyopathy: Secondary | ICD-10-CM | POA: Diagnosis present

## 2014-12-31 DIAGNOSIS — Z794 Long term (current) use of insulin: Secondary | ICD-10-CM | POA: Diagnosis not present

## 2014-12-31 DIAGNOSIS — I252 Old myocardial infarction: Secondary | ICD-10-CM | POA: Diagnosis not present

## 2014-12-31 DIAGNOSIS — Z89612 Acquired absence of left leg above knee: Secondary | ICD-10-CM | POA: Diagnosis not present

## 2014-12-31 DIAGNOSIS — I5042 Chronic combined systolic (congestive) and diastolic (congestive) heart failure: Secondary | ICD-10-CM | POA: Diagnosis present

## 2014-12-31 DIAGNOSIS — Z79891 Long term (current) use of opiate analgesic: Secondary | ICD-10-CM | POA: Diagnosis not present

## 2014-12-31 DIAGNOSIS — Z8249 Family history of ischemic heart disease and other diseases of the circulatory system: Secondary | ICD-10-CM | POA: Diagnosis not present

## 2014-12-31 DIAGNOSIS — J9611 Chronic respiratory failure with hypoxia: Secondary | ICD-10-CM | POA: Diagnosis present

## 2014-12-31 DIAGNOSIS — Z833 Family history of diabetes mellitus: Secondary | ICD-10-CM | POA: Diagnosis not present

## 2014-12-31 LAB — CBC
HEMATOCRIT: 27.9 % — AB (ref 39.0–52.0)
Hemoglobin: 8.9 g/dL — ABNORMAL LOW (ref 13.0–17.0)
MCH: 28.7 pg (ref 26.0–34.0)
MCHC: 31.9 g/dL (ref 30.0–36.0)
MCV: 90 fL (ref 78.0–100.0)
Platelets: 342 10*3/uL (ref 150–400)
RBC: 3.1 MIL/uL — ABNORMAL LOW (ref 4.22–5.81)
RDW: 14.8 % (ref 11.5–15.5)
WBC: 9.6 10*3/uL (ref 4.0–10.5)

## 2014-12-31 LAB — GLUCOSE, CAPILLARY
Glucose-Capillary: 459 mg/dL — ABNORMAL HIGH (ref 70–99)
Glucose-Capillary: 254 mg/dL — ABNORMAL HIGH (ref 70–99)
Glucose-Capillary: 249 mg/dL — ABNORMAL HIGH (ref 70–99)

## 2014-12-31 LAB — BASIC METABOLIC PANEL
Anion gap: 6 (ref 5–15)
BUN: 34 mg/dL — ABNORMAL HIGH (ref 6–23)
CO2: 32 mmol/L (ref 19–32)
CREATININE: 1.22 mg/dL (ref 0.50–1.35)
Calcium: 9.3 mg/dL (ref 8.4–10.5)
Chloride: 93 mmol/L — ABNORMAL LOW (ref 96–112)
GFR calc Af Amer: 67 mL/min — ABNORMAL LOW (ref >90)
GFR calc non Af Amer: 57 mL/min — ABNORMAL LOW (ref >90)
Glucose, Bld: 255 mg/dL — ABNORMAL HIGH (ref 70–99)
Potassium: 4.3 mmol/L (ref 3.5–5.1)
Sodium: 131 mmol/L — ABNORMAL LOW (ref 135–145)

## 2014-12-31 LAB — TROPONIN I
TROPONIN I: 0.22 ng/mL — AB (ref <0.031)
Troponin I: 0.17 ng/mL — ABNORMAL HIGH (ref <0.031)

## 2014-12-31 MED ORDER — AZITHROMYCIN 250 MG PO TABS: ORAL_TABLET | ORAL | Status: DC

## 2014-12-31 MED ORDER — IPRATROPIUM BROMIDE 0.02 % IN SOLN
0.5000 mg | RESPIRATORY_TRACT | Status: DC
  Administered 2014-12-31 (×2): 0.5 mg via RESPIRATORY_TRACT
  Filled 2014-12-31 (×2): qty 2.5

## 2014-12-31 MED ORDER — CETYLPYRIDINIUM CHLORIDE 0.05 % MT LIQD
7.0000 mL | Freq: Two times a day (BID) | OROMUCOSAL | Status: DC
  Administered 2014-12-31: 7 mL via OROMUCOSAL

## 2014-12-31 MED ORDER — INSULIN ASPART 100 UNIT/ML ~~LOC~~ SOLN
6.0000 [IU] | Freq: Once | SUBCUTANEOUS | Status: AC
  Administered 2014-12-31: 6 [IU] via SUBCUTANEOUS

## 2014-12-31 MED ORDER — LEVALBUTEROL HCL 0.63 MG/3ML IN NEBU
0.6300 mg | INHALATION_SOLUTION | RESPIRATORY_TRACT | Status: DC
Start: 2014-12-31 — End: 2014-12-31
  Administered 2014-12-31 (×2): 0.63 mg via RESPIRATORY_TRACT
  Filled 2014-12-31 (×2): qty 3

## 2014-12-31 MED ORDER — ASPIRIN EC 81 MG PO TBEC: 81.0000 mg | DELAYED_RELEASE_TABLET | Freq: Every day | ORAL | Status: DC

## 2014-12-31 MED ORDER — PREDNISONE 10 MG PO TABS
ORAL_TABLET | ORAL | Status: DC
Start: 2014-12-31 — End: 2015-02-12

## 2014-12-31 NOTE — Progress Notes (Signed)
Patient's blood sugar is now 459. Contacted hospitalist to inform of this. Order received to give 6 units of novolog. Will continue to monitor.

## 2014-12-31 NOTE — Progress Notes (Signed)
Utilization Review completed.  

## 2014-12-31 NOTE — Progress Notes (Signed)
Patient has a blood sugar of 537. Contacted midlevel provider. Order received to give 10 units of novolog. Will continue to monitor.

## 2014-12-31 NOTE — Progress Notes (Signed)
1415 Patient given d/c instructions and hard Rx. Patient educated on the importance of smoking cessation, second hand smoke, washing hands, etc to avoid chronic COPD exacerbations. Patient confirmed understanding. IV catheter removed from RIGHT forearm, catheter tip intact, no s/s of infection noted. Patient confirmed he had all his belongings and room checked by this writer prior to patient leaving the facility. Patient's son arrived to facility to transfer patient home. Patient transferred to vehicle by this Probation officer.

## 2014-12-31 NOTE — Discharge Summary (Signed)
Physician Discharge Summary  Stephen [name] TFT:[id number] DOB: 09/16/1941 DOA: 12/30/2014  PCP: Reynold Bowen, NT  Admit date: 12/30/2014 Discharge date: 12/31/2014  Time spent: 25 minutes  Recommendations for Outpatient Follow-up:  1. Patient will be scheduled to follow up with cardiology as an outpatient 2. Follow up with primary care physician  Discharge Diagnoses:  Active Problems:   Chronic ischemic heart disease   Chronic combined systolic and diastolic congestive heart failure   Secondary DM with complication   COPD exacerbation   Elevated troponin   Essential hypertension   Shortness of breath   Troponin level elevated   Chronic respiratory failure with hypoxia   Anemia   Discharge Condition: improved  Diet recommendation: low salt, low carb  Filed Weights   12/30/14 1337 12/30/14 1930 12/31/14 0613  Weight: 68.493 kg (151 lb) 67.9 kg (149 lb 11.1 oz) 67.8 kg (149 lb 7.6 oz)    History of present illness:  This patient presents to the hospital with sudden onset of shortness of breath, cough and wheezing. He has a known history of COPD he is chronically on 3 L. He also has an extensive cardiac history including chronic congestive heart failure with an ejection fraction of 30-35%. Coronary artery disease with CABG in the past. He was evaluated in the emergency room, received steroids as well as bronchodilators with significant improvement of his symptoms. Incidentally, a mildly elevated troponin was noted. EKG did not show any acute findings. He was seen by cardiology who recommended overnight admission for cycling troponins to see if he had any significant rise.  Hospital Course:  Patient was monitored in the hospital overnight. He reports his breathing continues to improve. He does not have any chest pain. Troponins remain mildly elevated. I suspect this is a demand ischemia related to COPD exacerbation. Patient does not have any other complaints including chest pain or  shortness of breath this time. He is feeling significantly improved and is very adamant about discharge home today. We will schedule a follow-up with cardiology in the outpatient setting. He is otherwise stable for discharge home.  Procedures:    Consultations:  Cardiology  Discharge Exam: Filed Vitals:   12/31/14 0613  BP: 147/54  Pulse:   Temp: 98 F (36.7 C)  Resp: 16    General: NAD Cardiovascular: S1, s2 rRR Respiratory: mild wheeze b/l  Discharge Instructions   Discharge Instructions    Call MD for:  difficulty breathing, headache or visual disturbances    Complete by:  As directed      Call MD for:  severe uncontrolled pain    Complete by:  As directed      Diet - low sodium heart healthy    Complete by:  As directed      Diet Carb Modified    Complete by:  As directed      Increase activity slowly    Complete by:  As directed           Current Discharge Medication List    START taking these medications   Details  aspirin EC 81 MG tablet Take 1 tablet (81 mg total) by mouth daily. Qty: 30 tablet, Refills: 1    azithromycin (ZITHROMAX) 250 MG tablet Take one tablet po daily for 3 more days Qty: 3 each, Refills: 0    predniSONE (DELTASONE) 10 MG tablet Take 40mg  po daily for 2 days then 30mg  po daily for 2 days then 20mg  po daily for 2 days  then 10mg  po daily for 2 days then stop Qty: 20 tablet, Refills: 0      CONTINUE these medications which have NOT CHANGED   Details  albuterol-ipratropium (COMBIVENT) 18-103 MCG/ACT inhaler Inhale 1-2 puffs into the lungs every 6 (six) hours as needed for wheezing or shortness of breath.    atorvastatin (LIPITOR) 40 MG tablet Take 40 mg by mouth daily.    fenofibrate 160 MG tablet Take 160 mg by mouth daily with breakfast.     furosemide (LASIX) 20 MG tablet Take 40 mg by mouth daily.    gabapentin (NEURONTIN) 100 MG capsule Take 300 mg by mouth at bedtime.     glipiZIDE (GLUCOTROL XL) 2.5 MG 24 hr tablet  Take 2.5 mg by mouth 2 (two) times daily.     HYDROcodone-acetaminophen (NORCO/VICODIN) 5-325 MG per tablet Take 1 tablet by mouth 4 (four) times daily as needed for moderate pain. Qty: 30 tablet, Refills: 0    insulin aspart (NOVOLOG) 100 UNIT/ML injection Inject 0-15 Units into the skin 3 (three) times daily with meals. Qty: 10 mL, Refills: 11    insulin glargine (LANTUS) 100 UNIT/ML injection Inject 0.15 mLs (15 Units total) into the skin daily. Qty: 10 mL, Refills: 11    levalbuterol (XOPENEX) 1.25 MG/3ML nebulizer solution Take 0.63 mg by nebulization every 4 (four) hours as needed for wheezing or shortness of breath. Qty: 72 mL, Refills: 0    lisinopril (PRINIVIL,ZESTRIL) 20 MG tablet Take 40 mg by mouth daily.     loratadine (CLARITIN) 10 MG tablet Take 10 mg by mouth daily.    metFORMIN (GLUCOPHAGE) 1000 MG tablet Take 1,000 mg by mouth every morning.     metoprolol tartrate (LOPRESSOR) 25 MG tablet Take 25 mg by mouth 2 (two) times daily.     nitroGLYCERIN (NITROSTAT) 0.4 MG SL tablet Place 0.4 mg under the tongue every 5 (five) minutes x 3 doses as needed for chest pain.     Pancrelipase, Lip-Prot-Amyl, (CREON) 24000 UNITS CPEP Take 48,000-72,000 Units by mouth See admin instructions. Take 72000 units 3 times daily with all meals and take 48000 units with snacks    pantoprazole (PROTONIX) 40 MG tablet Take 1 tablet (40 mg total) by mouth daily. Qty: 30 tablet, Refills: 1    spironolactone (ALDACTONE) 25 MG tablet Take 25 mg by mouth daily.      SYMBICORT 160-4.5 MCG/ACT inhaler Inhale 1-2 puffs into the lungs 2 (two) times daily as needed (shorntess of breath).     tiotropium (SPIRIVA) 18 MCG inhalation capsule Place 18 mcg into inhaler and inhale daily.    Amino Acids-Protein Hydrolys (FEEDING SUPPLEMENT, PRO-STAT SUGAR FREE 64,) LIQD Take 30 mLs by mouth 3 (three) times daily with meals. Qty: 900 mL, Refills: 0    clopidogrel (PLAVIX) 75 MG tablet Take 75 mg by mouth  daily.    isosorbide mononitrate (IMDUR) 30 MG 24 hr tablet Take 1 tablet (30 mg total) by mouth daily. Qty: 30 tablet, Refills: 0    metolazone (ZAROXOLYN) 2.5 MG tablet TAKE ONE (1) TABLET EACH DAY Qty: 30 tablet, Refills: 0       Allergies  Allergen Reactions  . Levaquin [Levofloxacin] Hives   Follow-up Information    Follow up with Herminio Commons, MD.   Specialty:  Cardiology   Why:  they will call you with an appointment for cardiology clinic   Contact information:   [place] [address] [phone]  The results of significant diagnostics from this hospitalization (including imaging, microbiology, ancillary and laboratory) are listed below for reference.    Significant Diagnostic Studies: Dg Chest 1 View  12/03/2014   CLINICAL DATA:  Bilateral lower extremity swelling.  EXAM: CHEST - 1 VIEW  COMPARISON:  PA and lateral chest 10/05/2014 the hand 10/23/2013.  FINDINGS: Heart size is normal. The patient is status post CABG. Lungs are clear. No pneumothorax or pleural effusion.  IMPRESSION: No acute disease.   Electronically Signed   By: Inge Rise M.D.   On: 12/03/2014 17:47   Dg Tibia/fibula Left  12/03/2014   CLINICAL DATA:  Bilateral lower extremity swelling. Wound over the medial malleolus of the left ankle.  EXAM: LEFT TIBIA AND FIBULA - 2 VIEW  COMPARISON:  None.  FINDINGS: Wound over the medial malleolus is identified. There may be an additional wound posteriorly along the left lower leg. a No acute bony or joint abnormality is seen. Atherosclerosis is noted.  IMPRESSION: Skin wounds without underlying acute bony or joint abnormality.  Atherosclerosis.   Electronically Signed   By: Inge Rise M.D.   On: 12/03/2014 17:49   Dg Ankle Complete Left  12/03/2014   CLINICAL DATA:  Bilateral leg swelling, bilateral ankle swelling, large round necrotic sore overlying medial malleolus on left ankle  EXAM: LEFT ANKLE COMPLETE - 3+ VIEW   COMPARISON:  None.  FINDINGS: No fracture.  Ankle mortise is normally spaced and aligned.  There is a soft tissue defect extending to the surface of the bone overlying the medial malleolus. No bone resorption is seen to suggest osteomyelitis. There is no soft tissue air. No radiopaque foreign body.  Mild diffuse subcutaneous soft tissue edema is noted of the lower leg and ankle.  IMPRESSION: No fracture.  No evidence of osteomyelitis.   Electronically Signed   By: Lajean Manes M.D.   On: 12/03/2014 17:47   Dg Ankle Complete Right  12/03/2014   CLINICAL DATA:  Bilateral lower extremity swelling and pain. Initial encounter.  EXAM: RIGHT ANKLE - COMPLETE 3+ VIEW  COMPARISON:  None.  FINDINGS: Soft tissue swelling is seen about the right ankle. No underlying acute bony or joint abnormality is identified. No soft tissue gas collection is seen. Atherosclerosis is noted.  IMPRESSION: Soft tissue swelling.  Otherwise negative.   Electronically Signed   By: Inge Rise M.D.   On: 12/03/2014 17:50   Dg Chest Portable 1 View  12/30/2014   CLINICAL DATA:  Short of breath.  Hyperglycemia  EXAM: PORTABLE CHEST - 1 VIEW  COMPARISON:  12/03/2014  FINDINGS: Postop CABG. Negative for heart failure. Negative for pneumonia or effusion. Lungs are clear.  IMPRESSION: No active disease.   Electronically Signed   By: Franchot Gallo M.D.   On: 12/30/2014 14:04    Microbiology: No results found for this or any previous visit (from the past 240 hour(s)).   Labs: Basic Metabolic Panel:  Recent Labs Lab 12/30/14 1406 12/30/14 2012 12/31/14 0601  NA 130*  --  131*  K 4.3  --  4.3  CL 95*  --  93*  CO2 29  --  32  GLUCOSE 406* 573* 255*  BUN 30*  --  34*  CREATININE 1.21  --  1.22  CALCIUM 9.0  --  9.3   Liver Function Tests: No results for input(s): AST, ALT, ALKPHOS, BILITOT, PROT, ALBUMIN in the last 168 hours. No results for input(s): LIPASE, AMYLASE in the last 168  hours. No results for input(s): AMMONIA  in the last 168 hours. CBC:  Recent Labs Lab 12/30/14 1406 12/31/14 0601  WBC 9.1 9.6  NEUTROABS 7.7  --   HGB 9.0* 8.9*  HCT 28.3* 27.9*  MCV 90.7 90.0  PLT 327 342   Cardiac Enzymes:  Recent Labs Lab 12/30/14 1406 12/30/14 2012 12/31/14 0244 12/31/14 0658  TROPONINI 0.13* 0.09* 0.17* 0.22*   BNP: BNP (last 3 results)  Recent Labs  12/30/14 1406  BNP 293.0*    ProBNP (last 3 results)  Recent Labs  07/21/14 2348 08/16/14 1351 08/17/14 0609  PROBNP 3366.0* 6508.0* 9230.0*    CBG:  Recent Labs Lab 12/30/14 1354 12/30/14 2112 12/31/14 0113 12/31/14 0753 12/31/14 1130  GLUCAP 388* 537* 459* 254* 249*       Signed:  Sharmel Ballantine  [facility] 12/31/2014, 1:48 PM

## 2015-01-03 ENCOUNTER — Telehealth: Payer: Self-pay

## 2015-01-03 NOTE — Telephone Encounter (Signed)
-----   Message from Lestine Mount sent at 01/02/2015 10:20 AM EST ----- Regarding: fyi Pt was suppose to have a one week follow up from post hospital. He has not means of transportation to get here. Not sure if you want to call and check on him.

## 2015-01-03 NOTE — Telephone Encounter (Signed)
Pt states he fell out of bed "again" States wife and son are caring for him.Has apt with pcp today at 3:30pm.Declines to make cardiology apt at this time.

## 2015-01-04 ENCOUNTER — Encounter: Payer: Self-pay | Admitting: Vascular Surgery

## 2015-01-05 ENCOUNTER — Ambulatory Visit (INDEPENDENT_AMBULATORY_CARE_PROVIDER_SITE_OTHER): Payer: Self-pay | Admitting: Vascular Surgery

## 2015-01-05 VITALS — BP 159/61 | HR 64 | Temp 97.8°F | Resp 14 | Ht 70.0 in | Wt 150.0 lb

## 2015-01-05 DIAGNOSIS — I739 Peripheral vascular disease, unspecified: Secondary | ICD-10-CM

## 2015-01-05 NOTE — Progress Notes (Signed)
HISTORY AND PHYSICAL   HPI: This is a 74 y.o. 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 He denies any drainage from the incision. He states that the wounds on his right leg have completely healed.  He is s/p  Aortogram with right EIA diamondback athrectomy, right EIA stent, left CIA and EIA athrectomy and left CIA and EIA stent 09/23/14.  He states the wounds on the right leg have healed.    He is on a statin for his hypercholesterolemia.  He is on a beta blocker and ACEI for his hypertension.  He is also diabetic and takes insulin as well as oral agents for this.    Past Medical History   Diagnosis  Date   .  Diabetes mellitus     .  Hypertension     .  Pancreatitis     .  Neuropathy     .  Hyperlipemia     .  Peripheral vascular disease         a. 10/2013: severely abnormal ABIs, for OP evaluation.   [name]  GERD (gastroesophageal reflux disease)     .  S/P colonoscopy  July 2003       hyperplastic polyp, diverticulosis, anal papilla and internal hemorrhoids r   .  S/P endoscopy  2003       Dr. Braulio Bosch: erosive reflux esophagitis , PUD   .  PUD (peptic ulcer disease)         diagnosed via EGD by Dr. Braulio Bosch at [facility]   .  Diverticulosis     .  Hemorrhoids     .  Coronary artery disease          a. BMS to LAD 1999. b. HSRA to LAD and stent to RCA 2007. c. NSTEMI/CABG x 3 in 04/2010. d. NSTEMI 10/2013 - secondary to demand ischemia in setting of COPD exacerbation with severe underlying CAD - cath with 3/3 patent grafts, for medical therapy.    [name]  COPD (chronic obstructive pulmonary disease)         a. Ongoing tobacco abuse.   .  Arthritis     .  Neutropenia         a. Dx 10/2013 on labs - instructed to f/u PCP.   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    .  Ischemic cardiomyopathy         a. previously EF 30% in 2011. b. 10/2013: EF 45-50% by  echo.   .  Alcoholism         a. Remote alcoholism   .  Cleft palate         a.Chronic cleft palate from a traumatic injury as a child.       Past Surgical History   Procedure  Laterality  Date   .  Coronary artery bypass graft    June 2011       X3   .  Right inguinal hernia repair       .  Cholecystectomy           3-4 years ago   .  Appendectomy           age 16   .  Cataracts       .  Colonoscopy    05/12/2002       Dr. [name]-  diverticulosis, anal papilla, internal hemorrhoids, rectal polyps   .  [name] dilation    04/16/2012       Schatzki's ring-status post dilation and disruption/ as described above. Grade 1 esophageal varices. Small hiatal hernia.   [name] dilation    04/16/2012       Procedure: [name] DILATION;  Surgeon: Daneil Dolin, MD;  Location: [facility]; Service: Endoscopy;  Laterality: N/A;   .  Colonoscopy    08/10/2012       Procedure: COLONOSCOPY;  Surgeon: Daneil Dolin, MD;  Location: [facility];  Service: Endoscopy;  Laterality: N/A;  9:45 needs 30 mins extra /have Glucagon on hand   .  Left heart catheterization with coronary/graft angiogram  N/A  10/29/2013       Procedure: LEFT HEART CATHETERIZATION WITH [name];  Surgeon: Burnell Blanks, MD;  Location: [facility] CATH LAB;  Service: Cardiovascular;  Laterality: N/A;   .  Abdominal aortagram  N/A  09/09/2014       Procedure: ABDOMINAL Maxcine Ham;  Surgeon: Elam Dutch, MD;  Location: [facility] CATH LAB;  Service: Cardiovascular;  Laterality: N/A;   .  Atherectomy  Bilateral  09/23/2014       Procedure: ATHERECTOMY;  Surgeon: Elam Dutch, MD;  Location: [facility] CATH LAB;  Service: Cardiovascular;  Laterality: Bilateral;  iliacs       Allergies   Allergen  Reactions   .  Levaquin [Levofloxacin]  Hives       Current Outpatient Prescriptions   Medication  Sig  Dispense  Refill   .  albuterol-ipratropium (COMBIVENT) 18-103 MCG/ACT inhaler  Inhale 1-2 puffs into the lungs every 6  (six) hours as needed for wheezing or shortness of breath.       [name]  aspirin 325 MG tablet  Take 325 mg by mouth daily.       [name]  atorvastatin (LIPITOR) 40 MG tablet  Take 40 mg by mouth daily.       .  clopidogrel (PLAVIX) 75 MG tablet  Take 75 mg by mouth daily.       [name]  doxycycline (VIBRAMYCIN) 100 MG capsule  Take 100 mg by mouth 2 (two) times daily.       .  fenofibrate 160 MG tablet  Take 160 mg by mouth daily with breakfast.        .  furosemide (LASIX) 20 MG tablet  Take 40 mg by mouth daily.       [name]  gabapentin (NEURONTIN) 100 MG capsule  Take 300 mg by mouth at bedtime.        [name]  glipiZIDE (GLUCOTROL XL) 2.5 MG 24 hr tablet  Take 2.5 mg by mouth daily.        [name]  HYDROcodone-acetaminophen (NORCO/VICODIN) 5-325 MG per tablet  Take 1 tablet by mouth 4 (four) times daily as needed for moderate pain.       [name]  insulin glargine (LANTUS) 100 UNIT/ML injection  Inject 20 Units into the skin daily.       .  isosorbide mononitrate (IMDUR) 30 MG 24 hr tablet  Take 1 tablet (30 mg total) by mouth daily.  30 tablet  0   .  levalbuterol (XOPENEX) 1.25 MG/3ML nebulizer solution  Take 0.63 mg by nebulization every 4 (four) hours as needed for wheezing or shortness of breath.  72 mL  0   .  lisinopril (PRINIVIL,ZESTRIL) 20 MG tablet  Take 40 mg by  mouth daily.        [name]  loratadine (CLARITIN) 10 MG tablet  Take 10 mg by mouth daily.       .  metFORMIN (GLUCOPHAGE) 1000 MG tablet  Take 1,000 mg by mouth daily.       .  metolazone (ZAROXOLYN) 2.5 MG tablet  TAKE ONE (1) TABLET EACH DAY  30 tablet  0   .  metoprolol tartrate (LOPRESSOR) 25 MG tablet  Take 25 mg by mouth 2 (two) times daily.        .  nitroGLYCERIN (NITROSTAT) 0.4 MG SL tablet  Place 0.4 mg under the tongue every 5 (five) minutes x 3 doses as needed for chest pain.        .  Pancrelipase, Lip-Prot-Amyl, (CREON) 24000 UNITS CPEP  Take 48,000-72,000 Units by mouth See admin instructions. Take 72000 units 3 times daily with all meals and take 48000  units with snacks       .  pantoprazole (PROTONIX) 40 MG tablet  Take 1 tablet (40 mg total) by mouth daily.  30 tablet  1   .  spironolactone (ALDACTONE) 25 MG tablet  Take 25 mg by mouth daily.         .  SYMBICORT 160-4.5 MCG/ACT inhaler  Inhale 1-2 puffs into the lungs 2 (two) times daily as needed (shorntess of breath).        .  tiotropium (SPIRIVA) 18 MCG inhalation capsule  Place 18 mcg into inhaler and inhale daily.          No current facility-administered medications for this visit.       Family History   Problem  Relation  Age of Onset   .  Coronary artery disease  Mother     .  Coronary artery disease  Father     .  Diabetes  Sister     .  Suicidality  Brother     .  Cirrhosis  Brother     .  Colon cancer  Neg Hx         History      Social History   .  Marital Status:  Married       Spouse Name:  N/A       Number of Children:  N/A   .  Years of Education:  N/A      Occupational History   .  Disabled        Social History Main Topics   .  Smoking status:  Current Every Day Smoker -- 1.00 packs/day for 63 years       Types:  Cigarettes   .  Smokeless tobacco:  Never Used   .  Alcohol Use:  No         Comment: Quit 1985   .  Drug Use:  No   .  Sexual Activity:  Not Currently      Other Topics  Concern   .  Not on file      Social History Narrative      ROS: [x]  Positive   [ ]  Negative   [ ]  All sytems reviewed and are negative  Cardiovascular: []  chest pain/pressure []  palpitations []  SOB lying flat []  DOE [x]  pain in legs while walking [x]  pain in feet when lying flat []  hx of DVT []  hx of phlebitis [x]  swelling in legs []  varicose veins  Pulmonary: []  productive cough []  asthma []  wheezing  Neurologic: [x]  weakness  in []  arms [x]  legs-bilaterally []  numbness in []  arms []  legs [] difficulty speaking or slurred speech []  temporary loss of vision in one eye []  dizziness  Hematologic: []  bleeding problems []  problems with blood  clotting easily  GI []  vomiting blood []  blood in stool  GU: []  burning with urination []  blood in urine  Psychiatric: []  hx of major depression  Integumentary: []  rashes [x]  ulcers-left leg  Constitutional: []  fever []  chills   PHYSICAL EXAMINATION:     Filed Vitals:   01/05/15 0927  BP: 159/61  Pulse: 64  Temp: 97.8 F (36.6 C)  TempSrc: Oral  Resp: 14  Height: 5\' 10"  (1.778 m)  Weight: 150 lb (68.04 kg)  SpO2: 96%    General:  WDWN in NAD Gait: Not observed HENT: WNL, normocephalic Cardiac: RRR Skin: without rashes, with ulcers  Vascular Exam/Pulses: 2+ femoral pulses bilaterally  Extremities:  left above-knee amputation healing well still was some edema    Non-Invasive Vascular Imaging:   ABI's 11/03/14: Right 0.55  Pt meds includes: Statin:  Yes.   Beta Blocker:  Yes.   Aspirin: Yes.   ACEI: Yes.   ARB: No Other Antiplatelet/Anticoagulant:  Yes.   Plavix   ASSESSMENT/PLAN::  left above-knee amputation healing well at this point staples removed today  Patient has cardiomyopathy, severe deconditioning, severe COPD. I have had several discussions with the patient and his sons and do not believe that he is in any condition to tolerate a femoropopliteal bypass currently. He has a patent aortoiliac system at this point. He has easily palpable bilateral femoral pulses.  He has no ulcers on the right leg currently. Hopefully the left leg ulcers will continue to heal with conservative measures. Due to his underlying multiple comorbidities and do not believe he will ever be a candidate for a femoropopliteal bypass. The occlusions in his lower extremities are too lengthy and calcified to consider percutaneous options. The patient will follow-up with me in 6 months for repeat ABIs.  He was given a prescription today for shrinker for his left above-knee amputation. Hopefully he will have stamina enough for a prosthetic leg at some point in the future.  Ruta Hinds, MD [facility] Office: [phone] Pager: [phone]

## 2015-02-10 ENCOUNTER — Other Ambulatory Visit (HOSPITAL_COMMUNITY): Payer: Self-pay

## 2015-02-10 ENCOUNTER — Encounter (HOSPITAL_COMMUNITY): Payer: Self-pay | Admitting: *Deleted

## 2015-02-10 ENCOUNTER — Inpatient Hospital Stay (HOSPITAL_COMMUNITY)
Admission: EM | Admit: 2015-02-10 | Discharge: 2015-02-12 | DRG: 603 | Disposition: A | Discharge status: Home / Self Care | Payer: Medicare Other | Attending: Family Medicine | Admitting: Family Medicine

## 2015-02-10 ENCOUNTER — Emergency Department (HOSPITAL_COMMUNITY): Payer: Medicare Other

## 2015-02-10 DIAGNOSIS — I5022 Chronic systolic (congestive) heart failure: Secondary | ICD-10-CM | POA: Diagnosis present

## 2015-02-10 DIAGNOSIS — L03115 Cellulitis of right lower limb: Principal | ICD-10-CM | POA: Diagnosis present

## 2015-02-10 DIAGNOSIS — Z794 Long term (current) use of insulin: Secondary | ICD-10-CM | POA: Diagnosis not present

## 2015-02-10 DIAGNOSIS — R778 Other specified abnormalities of plasma proteins: Secondary | ICD-10-CM | POA: Diagnosis present

## 2015-02-10 DIAGNOSIS — K219 Gastro-esophageal reflux disease without esophagitis: Secondary | ICD-10-CM | POA: Diagnosis present

## 2015-02-10 DIAGNOSIS — B9562 Methicillin resistant Staphylococcus aureus infection as the cause of diseases classified elsewhere: Secondary | ICD-10-CM | POA: Diagnosis present

## 2015-02-10 DIAGNOSIS — Z79899 Other long term (current) drug therapy: Secondary | ICD-10-CM | POA: Diagnosis not present

## 2015-02-10 DIAGNOSIS — K861 Other chronic pancreatitis: Secondary | ICD-10-CM | POA: Diagnosis present

## 2015-02-10 DIAGNOSIS — Z9981 Dependence on supplemental oxygen: Secondary | ICD-10-CM

## 2015-02-10 DIAGNOSIS — L02419 Cutaneous abscess of limb, unspecified: Secondary | ICD-10-CM | POA: Diagnosis present

## 2015-02-10 DIAGNOSIS — E119 Type 2 diabetes mellitus without complications: Secondary | ICD-10-CM | POA: Diagnosis present

## 2015-02-10 DIAGNOSIS — I255 Ischemic cardiomyopathy: Secondary | ICD-10-CM | POA: Diagnosis present

## 2015-02-10 DIAGNOSIS — M7989 Other specified soft tissue disorders: Secondary | ICD-10-CM | POA: Diagnosis not present

## 2015-02-10 DIAGNOSIS — I10 Essential (primary) hypertension: Secondary | ICD-10-CM | POA: Diagnosis present

## 2015-02-10 DIAGNOSIS — G629 Polyneuropathy, unspecified: Secondary | ICD-10-CM | POA: Diagnosis present

## 2015-02-10 DIAGNOSIS — I2510 Atherosclerotic heart disease of native coronary artery without angina pectoris: Secondary | ICD-10-CM | POA: Diagnosis present

## 2015-02-10 DIAGNOSIS — R7989 Other specified abnormal findings of blood chemistry: Secondary | ICD-10-CM | POA: Diagnosis present

## 2015-02-10 DIAGNOSIS — M1990 Unspecified osteoarthritis, unspecified site: Secondary | ICD-10-CM | POA: Diagnosis present

## 2015-02-10 DIAGNOSIS — J449 Chronic obstructive pulmonary disease, unspecified: Secondary | ICD-10-CM | POA: Diagnosis present

## 2015-02-10 DIAGNOSIS — F17210 Nicotine dependence, cigarettes, uncomplicated: Secondary | ICD-10-CM | POA: Diagnosis present

## 2015-02-10 DIAGNOSIS — Z7902 Long term (current) use of antithrombotics/antiplatelets: Secondary | ICD-10-CM

## 2015-02-10 DIAGNOSIS — Z951 Presence of aortocoronary bypass graft: Secondary | ICD-10-CM

## 2015-02-10 DIAGNOSIS — I248 Other forms of acute ischemic heart disease: Secondary | ICD-10-CM | POA: Diagnosis present

## 2015-02-10 DIAGNOSIS — Z89612 Acquired absence of left leg above knee: Secondary | ICD-10-CM | POA: Diagnosis not present

## 2015-02-10 DIAGNOSIS — L03119 Cellulitis of unspecified part of limb: Secondary | ICD-10-CM | POA: Diagnosis not present

## 2015-02-10 DIAGNOSIS — I739 Peripheral vascular disease, unspecified: Secondary | ICD-10-CM | POA: Diagnosis present

## 2015-02-10 DIAGNOSIS — Z7982 Long term (current) use of aspirin: Secondary | ICD-10-CM | POA: Diagnosis not present

## 2015-02-10 DIAGNOSIS — L97519 Non-pressure chronic ulcer of other part of right foot with unspecified severity: Secondary | ICD-10-CM | POA: Diagnosis present

## 2015-02-10 DIAGNOSIS — E1169 Type 2 diabetes mellitus with other specified complication: Secondary | ICD-10-CM | POA: Diagnosis present

## 2015-02-10 DIAGNOSIS — R0602 Shortness of breath: Secondary | ICD-10-CM

## 2015-02-10 DIAGNOSIS — L0390 Cellulitis, unspecified: Secondary | ICD-10-CM | POA: Insufficient documentation

## 2015-02-10 LAB — CBC WITH DIFFERENTIAL/PLATELET
BASOS ABS: 0 10*3/uL (ref 0.0–0.1)
BASOS PCT: 0 % (ref 0–1)
EOS ABS: 0.1 10*3/uL (ref 0.0–0.7)
EOS PCT: 1 % (ref 0–5)
HCT: 33.3 % — ABNORMAL LOW (ref 39.0–52.0)
Hemoglobin: 11.3 g/dL — ABNORMAL LOW (ref 13.0–17.0)
Lymphocytes Relative: 14 % (ref 12–46)
Lymphs Abs: 0.9 10*3/uL (ref 0.7–4.0)
MCH: 29.7 pg (ref 26.0–34.0)
MCHC: 33.9 g/dL (ref 30.0–36.0)
MCV: 87.6 fL (ref 78.0–100.0)
Monocytes Absolute: 0.6 10*3/uL (ref 0.1–1.0)
Monocytes Relative: 9 % (ref 3–12)
Neutro Abs: 4.8 10*3/uL (ref 1.7–7.7)
Neutrophils Relative %: 76 % (ref 43–77)
PLATELETS: 321 10*3/uL (ref 150–400)
RBC: 3.8 MIL/uL — ABNORMAL LOW (ref 4.22–5.81)
RDW: 16.2 % — AB (ref 11.5–15.5)
WBC: 6.3 10*3/uL (ref 4.0–10.5)

## 2015-02-10 LAB — GLUCOSE, CAPILLARY: GLUCOSE-CAPILLARY: 252 mg/dL — AB (ref 70–99)

## 2015-02-10 LAB — BASIC METABOLIC PANEL
Anion gap: 9 (ref 5–15)
BUN: 7 mg/dL (ref 6–23)
CO2: 30 mmol/L (ref 19–32)
CREATININE: 0.59 mg/dL (ref 0.50–1.35)
Calcium: 8.8 mg/dL (ref 8.4–10.5)
Chloride: 96 mmol/L (ref 96–112)
GFR calc Af Amer: >90 mL/min (ref >90)
GFR calc non Af Amer: >90 mL/min (ref >90)
Glucose, Bld: 216 mg/dL — ABNORMAL HIGH (ref 70–99)
Potassium: 3.6 mmol/L (ref 3.5–5.1)
Sodium: 135 mmol/L (ref 135–145)

## 2015-02-10 LAB — TROPONIN I
Troponin I: 0.04 ng/mL — ABNORMAL HIGH (ref <0.031)
Troponin I: 0.05 ng/mL — ABNORMAL HIGH (ref <0.031)

## 2015-02-10 LAB — BRAIN NATRIURETIC PEPTIDE: B Natriuretic Peptide: 848.5 pg/mL — ABNORMAL HIGH (ref 0.0–100.0)

## 2015-02-10 LAB — I-STAT CG4 LACTIC ACID, ED: LACTIC ACID, VENOUS: 0.83 mmol/L (ref 0.5–2.0)

## 2015-02-10 MED ORDER — SODIUM CHLORIDE 0.9 % IV SOLN: 250.0000 mL | INTRAVENOUS | Status: DC | PRN

## 2015-02-10 MED ORDER — HYDROMORPHONE HCL 1 MG/ML IJ SOLN: 0.5000 mg | INTRAMUSCULAR | Status: DC | PRN

## 2015-02-10 MED ORDER — CLOPIDOGREL BISULFATE 75 MG PO TABS
75.0000 mg | ORAL_TABLET | Freq: Every day | ORAL | Status: DC
  Administered 2015-02-11 – 2015-02-12 (×2): 75 mg via ORAL
  Filled 2015-02-10 (×2): qty 1

## 2015-02-10 MED ORDER — ONDANSETRON HCL 4 MG/2ML IJ SOLN: 4.0000 mg | Freq: Four times a day (QID) | INTRAMUSCULAR | Status: DC | PRN

## 2015-02-10 MED ORDER — ALUM & MAG HYDROXIDE-SIMETH 200-200-20 MG/5ML PO SUSP: 30.0000 mL | Freq: Four times a day (QID) | ORAL | Status: DC | PRN

## 2015-02-10 MED ORDER — NITROGLYCERIN 0.4 MG SL SUBL: 0.4000 mg | SUBLINGUAL_TABLET | SUBLINGUAL | Status: DC | PRN

## 2015-02-10 MED ORDER — INSULIN ASPART 100 UNIT/ML ~~LOC~~ SOLN
0.0000 [IU] | Freq: Three times a day (TID) | SUBCUTANEOUS | Status: DC
  Administered 2015-02-11 (×2): 3 [IU] via SUBCUTANEOUS
  Administered 2015-02-12: 1 [IU] via SUBCUTANEOUS

## 2015-02-10 MED ORDER — TRAZODONE HCL 50 MG PO TABS
50.0000 mg | ORAL_TABLET | Freq: Every day | ORAL | Status: DC
  Administered 2015-02-10 – 2015-02-11 (×2): 50 mg via ORAL
  Filled 2015-02-10 (×3): qty 1

## 2015-02-10 MED ORDER — ATORVASTATIN CALCIUM 40 MG PO TABS
40.0000 mg | ORAL_TABLET | Freq: Every day | ORAL | Status: DC
  Administered 2015-02-11 – 2015-02-12 (×2): 40 mg via ORAL
  Filled 2015-02-10 (×2): qty 1

## 2015-02-10 MED ORDER — PIPERACILLIN-TAZOBACTAM 3.375 G IVPB
3.3750 g | Freq: Three times a day (TID) | INTRAVENOUS | Status: DC
  Administered 2015-02-10 – 2015-02-11 (×3): 3.375 g via INTRAVENOUS
  Filled 2015-02-10 (×5): qty 50

## 2015-02-10 MED ORDER — FENOFIBRATE 160 MG PO TABS
160.0000 mg | ORAL_TABLET | Freq: Every day | ORAL | Status: DC
  Administered 2015-02-11 – 2015-02-12 (×2): 160 mg via ORAL
  Filled 2015-02-10 (×3): qty 1

## 2015-02-10 MED ORDER — FUROSEMIDE 40 MG PO TABS
40.0000 mg | ORAL_TABLET | Freq: Every day | ORAL | Status: DC
  Administered 2015-02-11 – 2015-02-12 (×2): 40 mg via ORAL
  Filled 2015-02-10 (×2): qty 1

## 2015-02-10 MED ORDER — VANCOMYCIN HCL IN DEXTROSE 750-5 MG/150ML-% IV SOLN
750.0000 mg | Freq: Two times a day (BID) | INTRAVENOUS | Status: DC
  Administered 2015-02-10 – 2015-02-11 (×2): 750 mg via INTRAVENOUS
  Filled 2015-02-10 (×3): qty 150

## 2015-02-10 MED ORDER — PANCRELIPASE (LIP-PROT-AMYL) 12000-38000 UNITS PO CPEP
48000.0000 [IU] | ORAL_CAPSULE | Freq: Three times a day (TID) | ORAL | Status: DC
  Administered 2015-02-11 – 2015-02-12 (×5): 48000 [IU] via ORAL
  Filled 2015-02-10 (×7): qty 4

## 2015-02-10 MED ORDER — METOLAZONE 2.5 MG PO TABS
2.5000 mg | ORAL_TABLET | Freq: Every day | ORAL | Status: DC
  Administered 2015-02-11 – 2015-02-12 (×2): 2.5 mg via ORAL
  Filled 2015-02-10 (×2): qty 1

## 2015-02-10 MED ORDER — HEPARIN SODIUM (PORCINE) 5000 UNIT/ML IJ SOLN
5000.0000 [IU] | Freq: Three times a day (TID) | INTRAMUSCULAR | Status: DC
  Administered 2015-02-10 – 2015-02-12 (×5): 5000 [IU] via SUBCUTANEOUS
  Filled 2015-02-10 (×6): qty 1

## 2015-02-10 MED ORDER — LISINOPRIL 40 MG PO TABS
40.0000 mg | ORAL_TABLET | Freq: Every day | ORAL | Status: DC
  Administered 2015-02-11 – 2015-02-12 (×2): 40 mg via ORAL
  Filled 2015-02-10 (×2): qty 1

## 2015-02-10 MED ORDER — ACETAMINOPHEN 650 MG RE SUPP: 650.0000 mg | Freq: Four times a day (QID) | RECTAL | Status: DC | PRN

## 2015-02-10 MED ORDER — INSULIN GLARGINE 100 UNIT/ML ~~LOC~~ SOLN
15.0000 [IU] | Freq: Every day | SUBCUTANEOUS | Status: DC
  Administered 2015-02-11 – 2015-02-12 (×2): 15 [IU] via SUBCUTANEOUS
  Filled 2015-02-10 (×2): qty 0.15

## 2015-02-10 MED ORDER — ASPIRIN EC 81 MG PO TBEC
81.0000 mg | DELAYED_RELEASE_TABLET | Freq: Every day | ORAL | Status: DC
  Administered 2015-02-11 – 2015-02-12 (×2): 81 mg via ORAL
  Filled 2015-02-10 (×2): qty 1

## 2015-02-10 MED ORDER — METOPROLOL TARTRATE 25 MG PO TABS
25.0000 mg | ORAL_TABLET | Freq: Two times a day (BID) | ORAL | Status: DC
  Administered 2015-02-10 – 2015-02-12 (×4): 25 mg via ORAL
  Filled 2015-02-10 (×5): qty 1

## 2015-02-10 MED ORDER — ONDANSETRON HCL 4 MG PO TABS: 4.0000 mg | ORAL_TABLET | Freq: Four times a day (QID) | ORAL | Status: DC | PRN

## 2015-02-10 MED ORDER — SODIUM CHLORIDE 0.9 % IJ SOLN: 3.0000 mL | INTRAMUSCULAR | Status: DC | PRN

## 2015-02-10 MED ORDER — SPIRONOLACTONE 25 MG PO TABS
25.0000 mg | ORAL_TABLET | Freq: Every day | ORAL | Status: DC
  Administered 2015-02-11 – 2015-02-12 (×2): 25 mg via ORAL
  Filled 2015-02-10 (×2): qty 1

## 2015-02-10 MED ORDER — ACETAMINOPHEN 325 MG PO TABS: 650.0000 mg | ORAL_TABLET | Freq: Four times a day (QID) | ORAL | Status: DC | PRN

## 2015-02-10 MED ORDER — ISOSORBIDE MONONITRATE ER 30 MG PO TB24
30.0000 mg | ORAL_TABLET | Freq: Every day | ORAL | Status: DC
  Administered 2015-02-11 – 2015-02-12 (×2): 30 mg via ORAL
  Filled 2015-02-10 (×2): qty 1

## 2015-02-10 MED ORDER — INSULIN ASPART 100 UNIT/ML ~~LOC~~ SOLN
0.0000 [IU] | Freq: Every day | SUBCUTANEOUS | Status: DC
  Administered 2015-02-10: 3 [IU] via SUBCUTANEOUS
  Administered 2015-02-11: 2 [IU] via SUBCUTANEOUS

## 2015-02-10 MED ORDER — CITALOPRAM HYDROBROMIDE 20 MG PO TABS
20.0000 mg | ORAL_TABLET | Freq: Every day | ORAL | Status: DC
  Administered 2015-02-11 – 2015-02-12 (×2): 20 mg via ORAL
  Filled 2015-02-10 (×2): qty 1

## 2015-02-10 MED ORDER — PANTOPRAZOLE SODIUM 40 MG PO TBEC
40.0000 mg | DELAYED_RELEASE_TABLET | Freq: Every day | ORAL | Status: DC
  Administered 2015-02-11 – 2015-02-12 (×2): 40 mg via ORAL
  Filled 2015-02-10 (×2): qty 1

## 2015-02-10 MED ORDER — SODIUM CHLORIDE 0.9 % IJ SOLN
3.0000 mL | Freq: Two times a day (BID) | INTRAMUSCULAR | Status: DC
  Administered 2015-02-12: 3 mL via INTRAVENOUS

## 2015-02-10 MED ORDER — IPRATROPIUM-ALBUTEROL 0.5-2.5 (3) MG/3ML IN SOLN: 3.0000 mL | RESPIRATORY_TRACT | Status: DC

## 2015-02-10 MED ORDER — OXYCODONE HCL 5 MG PO TABS
5.0000 mg | ORAL_TABLET | ORAL | Status: DC | PRN
  Administered 2015-02-11 – 2015-02-12 (×3): 5 mg via ORAL
  Filled 2015-02-10 (×3): qty 1

## 2015-02-10 NOTE — ED Notes (Addendum)
Conformed with pharmacist, Lin Givens and vancocin are compatible IV.

## 2015-02-10 NOTE — ED Notes (Addendum)
Pt reports swelling and redness to his right leg for several days, denies fever or sob. Redness noted and warm to touch.

## 2015-02-10 NOTE — Progress Notes (Signed)
Received report from [place], [place] in ED to transport patient to [facility].

## 2015-02-10 NOTE — H&P (Signed)
[facility] Admission History and Physical       Stephen [name] CWC:[id number] DOB: 01-25-1941 DOA: 02/10/2015  Referring physician: EDP PCP: Reynold Bowen, NT  Specialists:   Chief Complaint: Redness and Swelling of right leg  HPI: Stephen [name] is a 74 y.o. male with multiple Medical Problems including DM2, PVD, O2 Dependent COPD , Chronic Systolic CHFwho was sent to the ED by the orthopedic his podiatrist due to increased redness and swelling of his RLE and worsening of an ulcer of his right foot for the past 3-4 days.  He denies any Fevers or Chills.  He reports having chronic swelling to his right leg, but the redness has worsened.   AN x-ray was performed of the right foot, and was negative for osteomyelitis changes but revealed soft tissue welling, and he was placed on IV antibiotics Vancomycin and Zosyn and referred for admission.     Review of Systems:  Constitutional: No Weight Loss, No Weight Gain, Night Sweats, Fevers, Chills, Dizziness, Light Headedness, Fatigue, or Generalized Weakness HEENT: No Headaches, Difficulty Swallowing,Tooth/Dental Problems,Sore Throat,  No Sneezing, Rhinitis, Ear Ache, Nasal Congestion, or Post Nasal Drip,  Cardio-vascular:  No Chest pain, Orthopnea, PND, Edema in Lower Extremities, Anasarca, Dizziness, Palpitations  Resp: No Dyspnea, No DOE, No Productive Cough, No Non-Productive Cough, No Hemoptysis, No Wheezing.    GI: No Heartburn, Indigestion, Abdominal Pain, Nausea, Vomiting, Diarrhea, Constipation, Hematemesis, Hematochezia, Melena, Change in Bowel Habits,  Loss of Appetite  GU: No Dysuria, No Change in Color of Urine, No Urgency or Urinary Frequency, No Flank pain.  Musculoskeletal: No Joint Pain or Swelling, No Decreased Range of Motion, No Back Pain.  Neurologic: No Syncope, No Seizures, Muscle Weakness, Paresthesia, Vision Disturbance or Loss, No Diplopia, No Vertigo, No Difficulty Walking,  Skin: +Right Foot Ulcer. Psych: No  Change in Mood or Affect, No Depression or Anxiety, No Memory loss, No Confusion, or Hallucinations   Past Medical History  Diagnosis Date  . Diabetes mellitus   . Hypertension   . Pancreatitis   . Neuropathy   . Hyperlipemia   . Peripheral vascular disease     a. 10/2013: severely abnormal ABIs, for OP evaluation.  [name] GERD (gastroesophageal reflux disease)   . S/P colonoscopy July 2003    hyperplastic polyp, diverticulosis, anal papilla and internal hemorrhoids r  . S/P endoscopy 2003    Dr. Braulio Bosch: erosive reflux esophagitis , PUD  . PUD (peptic ulcer disease)     diagnosed via EGD by Dr. Braulio Bosch at [facility]  . Diverticulosis   . Hemorrhoids   . Coronary artery disease      a. BMS to LAD 1999. b. HSRA to LAD and stent to RCA 2007. c. NSTEMI/CABG x 3 in 04/2010. d. NSTEMI 10/2013 - secondary to demand ischemia in setting of COPD exacerbation with severe underlying CAD - cath with 3/3 patent grafts, for medical therapy.   [name] COPD (chronic obstructive pulmonary disease)     a. Ongoing tobacco abuse.  . Arthritis   . Neutropenia     a. Dx 10/2013 on labs - instructed to f/u PCP.  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   . Ischemic cardiomyopathy     a. previously EF 30% in 2011. b. 10/2013: EF 45-50% by echo.  . Alcoholism     a. Remote alcoholism  . Cleft palate     a.Chronic cleft palate  from a traumatic injury as a child.     Past Surgical History  Procedure Laterality Date  . Coronary artery bypass graft  June 2011    X3  . Right inguinal hernia repair    . Cholecystectomy      3-4 years ago  . Appendectomy      age 38  . Cataracts    . Colonoscopy  05/12/2002    Dr. [name]- diverticulosis, anal papilla, internal hemorrhoids, rectal polyps  . [name] dilation  04/16/2012    Schatzki's ring-status post dilation and disruption/ as described above. Grade 1 esophageal varices. Small  hiatal hernia.  [name] dilation  04/16/2012    Procedure: [name] DILATION;  Surgeon: Daneil Dolin, MD;  [facility];  Service: Endoscopy;  Laterality: N/A;  . Colonoscopy  08/10/2012    Procedure: COLONOSCOPY;  Surgeon: Daneil Dolin, MD;  Location: [facility];  Service: Endoscopy;  Laterality: N/A;  9:45 needs 30 mins extra /have Glucagon on hand  . Left heart catheterization with coronary/graft angiogram N/A 10/29/2013    Procedure: LEFT HEART CATHETERIZATION WITH [name];  Surgeon: Burnell Blanks, MD;  Location: [facility] CATH LAB;  Service: Cardiovascular;  Laterality: N/A;  . Abdominal aortagram N/A 09/09/2014    Procedure: ABDOMINAL Maxcine Ham;  Surgeon: Elam Dutch, MD;  Location: [facility] CATH LAB;  Service: Cardiovascular;  Laterality: N/A;  . Atherectomy Bilateral 09/23/2014    Procedure: ATHERECTOMY;  Surgeon: Elam Dutch, MD;  Location: [facility] CATH LAB;  Service: Cardiovascular;  Laterality: Bilateral;  iliacs  . Amputation Left 12/05/2014    Procedure: AMPUTATION ABOVE KNEE;  Surgeon: Elam Dutch, MD;  Location: [place];  Service: Vascular;  Laterality: Left;      Prior to Admission medications   Medication Sig Start Date End Date Taking? Authorizing Provider  albuterol-ipratropium (COMBIVENT) 18-103 MCG/ACT inhaler Inhale 1-2 puffs into the lungs every 6 (six) hours as needed for wheezing or shortness of breath. 11/02/13  Yes Dayna N Dunn, PA-C  aspirin EC 81 MG tablet Take 1 tablet (81 mg total) by mouth daily. 12/31/14  Yes Kathie Dike, MD  atorvastatin (LIPITOR) 40 MG tablet Take 40 mg by mouth daily.   Yes Historical Provider, MD  citalopram (CELEXA) 20 MG tablet Take 20 mg by mouth daily.   Yes Historical Provider, MD  clopidogrel (PLAVIX) 75 MG tablet Take 75 mg by mouth daily.   Yes Historical Provider, MD  fenofibrate 160 MG tablet Take 160 mg by mouth daily with breakfast.    Yes Historical Provider, MD  furosemide (LASIX) 20 MG  tablet Take 40 mg by mouth daily.   Yes Historical Provider, MD  gabapentin (NEURONTIN) 100 MG capsule Take 300 mg by mouth at bedtime.  06/08/14  Yes Historical Provider, MD  glipiZIDE (GLUCOTROL XL) 2.5 MG 24 hr tablet Take 2.5 mg by mouth 2 (two) times daily.    Yes Historical Provider, MD  insulin glargine (LANTUS) 100 UNIT/ML injection Inject 0.15 mLs (15 Units total) into the skin daily. Patient taking differently: Inject 10-30 Units into the skin daily as needed (for elevated blood sugar levels).  12/09/14  Yes Albertine Patricia, MD  isosorbide mononitrate (IMDUR) 30 MG 24 hr tablet Take 1 tablet (30 mg total) by mouth daily. 08/19/14  Yes Samuella Cota, MD  levalbuterol Penne Lash) 1.25 MG/3ML nebulizer solution Take 0.63 mg by nebulization every 4 (four) hours as needed for wheezing or shortness of breath. 07/14/14  Yes Shanker Kristeen Mans, MD  lisinopril (PRINIVIL,ZESTRIL) 20 MG tablet Take 40 mg by mouth daily.    Yes Historical Provider, MD  metFORMIN (GLUCOPHAGE) 1000 MG tablet Take 1,000 mg by mouth every morning.    Yes Historical Provider, MD  metolazone (ZAROXOLYN) 2.5 MG tablet TAKE ONE (1) TABLET EACH DAY 10/03/14  Yes Lendon Colonel, NP  metoprolol tartrate (LOPRESSOR) 25 MG tablet Take 25 mg by mouth 2 (two) times daily.    Yes Historical Provider, MD  nitroGLYCERIN (NITROSTAT) 0.4 MG SL tablet Place 0.4 mg under the tongue every 5 (five) minutes x 3 doses as needed for chest pain.    Yes Historical Provider, MD  OXYGEN Inhale 3 L into the lungs daily as needed (shortess of breath).   Yes Historical Provider, MD  Pancrelipase, Lip-Prot-Amyl, (CREON) 24000 UNITS CPEP Take 48,000-72,000 Units by mouth See admin instructions. Take 72000 units 3 times daily with all meals and take 48000 units with snacks   Yes Historical Provider, MD  pantoprazole (PROTONIX) 40 MG tablet Take 1 tablet (40 mg total) by mouth daily. 11/02/13  Yes Dayna N Dunn, PA-C  spironolactone (ALDACTONE) 25 MG  tablet Take 25 mg by mouth daily.     Yes Historical Provider, MD  SYMBICORT 160-4.5 MCG/ACT inhaler Inhale 1-2 puffs into the lungs 2 (two) times daily as needed (shorntess of breath).  07/04/12  Yes Historical Provider, MD  traZODone (DESYREL) 50 MG tablet Take 50 mg by mouth at bedtime.   Yes Historical Provider, MD  Amino Acids-Protein Hydrolys (FEEDING SUPPLEMENT, PRO-STAT SUGAR FREE 64,) LIQD Take 30 mLs by mouth 3 (three) times daily with meals. Patient not taking: Reported on 02/10/2015 12/09/14   Albertine Patricia, MD  azithromycin (ZITHROMAX) 250 MG tablet Take one tablet po daily for 3 more days Patient not taking: Reported on 02/10/2015 12/31/14   Kathie Dike, MD  HYDROcodone-acetaminophen (NORCO/VICODIN) 5-325 MG per tablet Take 1 tablet by mouth 4 (four) times daily as needed for moderate pain. Patient not taking: Reported on 02/10/2015 12/09/14   Silver Huguenin Elgergawy, MD  insulin aspart (NOVOLOG) 100 UNIT/ML injection Inject 0-15 Units into the skin 3 (three) times daily with meals. Patient not taking: Reported on 02/10/2015 12/09/14   Silver Huguenin Elgergawy, MD  loratadine (CLARITIN) 10 MG tablet Take 10 mg by mouth daily.    Historical Provider, MD  predniSONE (DELTASONE) 10 MG tablet Take 40mg  po daily for 2 days then 30mg  po daily for 2 days then 20mg  po daily for 2 days then 10mg  po daily for 2 days then stop Patient not taking: Reported on 02/10/2015 12/31/14   Kathie Dike, MD     Allergies  Allergen Reactions  . Levaquin [Levofloxacin] Hives    Social History:  reports that he has been smoking Cigarettes.  He has a 47.25 pack-year smoking history. He has never used smokeless tobacco. He reports that he does not drink alcohol or use illicit drugs.    Family History  Problem Relation Age of Onset  . Coronary artery disease Mother   . Coronary artery disease Father   . Diabetes Sister   . Suicidality Brother   . Cirrhosis Brother   . Colon cancer Neg Hx        Physical  Exam:  GEN:  Pleasant Elderly Thin  74 y.o. Caucasian male examined and in no acute distress; cooperative with exam Filed Vitals:   02/10/15 1915 02/10/15 1930 02/10/15 2000 02/10/15 2030  BP: 178/70  183/63 177/88 152/96  Pulse: 79 80 95 79  Temp:      TempSrc:      Resp: 19 16 23 18   Height: 5\' 10"  (1.778 m)     Weight: 68 kg (149 lb 14.6 oz)     SpO2: 95% 92% 96% 96%   Blood pressure 152/96, pulse 79, temperature 98.1 F (36.7 C), temperature source Oral, resp. rate 18, height 5\' 10"  (1.778 m), weight 68 kg (149 lb 14.6 oz), SpO2 96 %. PSYCH: He is alert and oriented x4; does not appear anxious does not appear depressed; affect is normal HEENT: Normocephalic and Atraumatic, Mucous membranes pink; PERRLA; EOM intact; Fundi:  Benign;  No scleral icterus, Nares: Patent, Oropharynx: Clear, Edentulous,    Neck:  FROM, No Cervical Lymphadenopathy nor Thyromegaly or Carotid Bruit; No JVD; Breasts:: Not examined CHEST WALL: No tenderness CHEST: Normal respiration, clear to auscultation bilaterally HEART: Regular rate and rhythm; no murmurs rubs or gallops BACK: No kyphosis or scoliosis; No CVA tenderness ABDOMEN: Positive Bowel Sounds, Soft Non-Tender, No Rebound or Guarding; No Masses, No Organomegaly Rectal Exam: Not done EXTREMITIES: + Left AKA , RLE:  2+ Edema of Leg, and 3+ Edema of the Foot,  +Ulceration on plantar aspect of Right Great Toe to the base of 1st MTP. Genitalia: not examined PULSES: 2+ and symmetric SKIN: Normal hydration no rash or ulceration CNS:  Alert and Oriented x 4, No Focal Deficits Gait: deferred  Vascular: pulses palpable throughout    Labs on Admission:  Basic Metabolic Panel:  Recent Labs Lab 02/10/15 1705  NA 135  K 3.6  CL 96  CO2 30  GLUCOSE 216*  BUN 7  CREATININE 0.59  CALCIUM 8.8   Liver Function Tests: No results for input(s): AST, ALT, ALKPHOS, BILITOT, PROT, ALBUMIN in the last 168 hours. No results for input(s): LIPASE, AMYLASE in  the last 168 hours. No results for input(s): AMMONIA in the last 168 hours. CBC:  Recent Labs Lab 02/10/15 1705  WBC 6.3  NEUTROABS 4.8  HGB 11.3*  HCT 33.3*  MCV 87.6  PLT 321   Cardiac Enzymes:  Recent Labs Lab 02/10/15 1705  TROPONINI 0.04*    BNP (last 3 results)  Recent Labs  12/30/14 1406 02/10/15 1706  BNP 293.0* 848.5*    ProBNP (last 3 results)  Recent Labs  07/21/14 2348 08/16/14 1351 08/17/14 0609  PROBNP 3366.0* 6508.0* 9230.0*    CBG: No results for input(s): GLUCAP in the last 168 hours.  Radiological Exams on Admission: Dg Chest 2 View  02/10/2015   CLINICAL DATA:  Chest pain and difficulty breathing 2 days. Cough and shortness of breath with fever and nausea.  EXAM: CHEST  2 VIEW  COMPARISON:  01/30/2015 and 12/30/2014.  FINDINGS: Lungs are adequately inflated and otherwise clear. Cardiomediastinal silhouette and remainder of the exam is unchanged.  IMPRESSION: No active cardiopulmonary disease.   Electronically Signed   By: Marin Olp M.D.   On: 02/10/2015 18:19   Dg Foot Complete Right  02/10/2015   CLINICAL DATA:  Right foot and leg swelling. Open sores on plantar surface.  EXAM: RIGHT FOOT COMPLETE - 3+ VIEW  COMPARISON:  None.  FINDINGS: Mild diffuse decreased bone mineralization. Moderate soft tissue swelling over the dorsum of the foot. No bone destruction to suggest osteomyelitis. Small vessel atherosclerotic disease is present. Small inferior calcaneal spur.  IMPRESSION: Moderate soft tissue swelling over the dorsum of the foot. No evidence of osteomyelitis.  Electronically Signed   By: Marin Olp M.D.   On: 02/10/2015 18:21     EKG: Independently reviewed.    Assessment/Plan:   74 y.o. male with  Principal Problem:   1.    Cellulitis and abscess of leg   IV Vancomycin and Zosyn      Active Problems:   2.    Foot ulcer, right   Wound Care Evaluation   Wound  Care BID     3.    PVD (peripheral vascular  disease)        4.    COPD II/III still smoking    DuoNebs    Continue NCO2 at 3 liters   Monitor O2 sat     5.    Systolic CHF, chronic   Continue Metolazone, Lasix, and Spironolactone     6.    HTN (hypertension)   Continue Metoprolol, Lisinopril, Lasix, Metolazone, and Spironolactone    As BP tolerates        7.    Type 2 diabetes mellitus with other specified complication   Lantus 15 units SQ daily hold if Glucose < 100   SSI Coverage PRN   Check HbA1C.        8.    Coronary artery disease   Continue Imdur, Metoprolol, Atorvastatin, ASA, and Plavix Rx        9.    Troponin level elevated   Due to Demand Ischemia, or his Chronic CHF   Cycle Troponins   10.    Chronic Pancreatitis-   Continue Pancreon TID with meals    11.   DVT Prophylaxis    Heparin SQ           Code Status:     FULL CODE        Family Communication:   No Family at Bedside       Disposition Plan:    Inpatient Status        Time spent:  [facility] Pager [phone]   If [place] Please Contact the Day Rounding Team MD for [facility]  If 7PM-7AM, Please Contact Night-Floor Coverage  [email or web address] Password [facility] 02/10/2015, 9:17 PM     ADDENDUM:   Patient was seen and examined on 02/10/2015

## 2015-02-10 NOTE — Progress Notes (Signed)
ANTIBIOTIC CONSULT NOTE - INITIAL  Pharmacy Consult for Vancomycin/zosyn Indication: Cellulitis  Allergies  Allergen Reactions  . Levaquin [Levofloxacin] Hives    Patient Measurements: Height: 5\' 10"  (177.8 cm) Weight: 149 lb 14.6 oz (68 kg) IBW/kg (Calculated) : 73  Vital Signs: Temp: 98.1 F (36.7 C) (04/08 1610) Temp Source: Oral (04/08 1610) BP: 178/70 mmHg (04/08 1915) Pulse Rate: 79 (04/08 1915) Intake/Output from previous day:   Intake/Output from this shift: Total I/O In: -  Out: 200 [Urine:200]  Labs:  Recent Labs  02/10/15 1705  WBC 6.3  HGB 11.3*  PLT 321  CREATININE 0.59   Estimated Creatinine Clearance: 80.3 mL/min (by C-G formula based on Cr of 0.59). No results for input(s): VANCOTROUGH, VANCOPEAK, VANCORANDOM, GENTTROUGH, GENTPEAK, GENTRANDOM, TOBRATROUGH, TOBRAPEAK, TOBRARND, AMIKACINPEAK, AMIKACINTROU, AMIKACIN in the last 72 hours.   Microbiology: No results found for this or any previous visit (from the past 720 hour(s)).  Medical History: Past Medical History  Diagnosis Date  . Diabetes mellitus   . Hypertension   . Pancreatitis   . Neuropathy   . Hyperlipemia   . Peripheral vascular disease     a. 10/2013: severely abnormal ABIs, for OP evaluation.  [name] GERD (gastroesophageal reflux disease)   . S/P colonoscopy July 2003    hyperplastic polyp, diverticulosis, anal papilla and internal hemorrhoids r  . S/P endoscopy 2003    Dr. Braulio Bosch: erosive reflux esophagitis , PUD  . PUD (peptic ulcer disease)     diagnosed via EGD by Dr. Braulio Bosch at [place]  . Diverticulosis   . Hemorrhoids   . Coronary artery disease      a. BMS to LAD 1999. b. HSRA to LAD and stent to RCA 2007. c. NSTEMI/CABG x 3 in 04/2010. d. NSTEMI 10/2013 - secondary to demand ischemia in setting of COPD exacerbation with severe underlying CAD - cath with 3/3 patent grafts, for medical therapy.   [name] COPD (chronic obstructive pulmonary disease)     a. Ongoing  tobacco abuse.  . Arthritis   . Neutropenia     a. Dx 10/2013 on labs - instructed to f/u PCP.  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   . Ischemic cardiomyopathy     a. previously EF 30% in 2011. b. 10/2013: EF 45-50% by echo.  . Alcoholism     a. Remote alcoholism  . 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    Assessment: 16 YOM with hx of DM presented with right leg redness and increased swelling for about a week. Pharmacy is consulted to start empiric vancomycin and zosyn for cellulitis. Scr 0.59, est. crcl ~ 80 ml/min. Blood cultures are pending.  Vancomycin 4/8 >> Zosyn 4/8 >>  4/8 blood cx x 2 -  Goal of Therapy:  Vancomycin trough level 10-15 mcg/ml  Plan:  Vancomycin 750 mg IV Q 12 hrs Zosyn 3.375g IV Q 8 hrs (4 hr infusion) Monitor renal function and f/u cultures Vancomycin trough at steady state.  Maryanna Shape, PharmD, BCPS  Clinical Pharmacist  Pager: [phone]   02/10/2015,8:27 PM

## 2015-02-10 NOTE — ED Provider Notes (Signed)
CSN: [id number]     Arrival date & time 02/10/15  1402 History   First MD Initiated Contact with Patient 02/10/15 1631     Chief Complaint  Patient presents with  . Leg Swelling     (Consider location/radiation/quality/duration/timing/severity/associated sxs/prior Treatment) HPI Comments: Patient presents to the ER for evaluation of redness and increased swelling of the right leg. Symptoms ongoing for approximately a week. He reports that he has chronic swelling of the leg. There is a phone on the plantar aspect of his right great toe that he reports has been there for "a long time". He has noticed increased swelling, redness, warmth and increased pain of the foot in the lower leg over the past week.   Patient reports a history of COPD. He does not have any cough or fever. He does endorse shortness of breath.   Past Medical History  Diagnosis Date  . Diabetes mellitus   . Hypertension   . Pancreatitis   . Neuropathy   . Hyperlipemia   . Peripheral vascular disease     a. 10/2013: severely abnormal ABIs, for OP evaluation.  [name] GERD (gastroesophageal reflux disease)   . S/P colonoscopy July 2003    hyperplastic polyp, diverticulosis, anal papilla and internal hemorrhoids r  . S/P endoscopy 2003    Dr. Braulio Bosch: erosive reflux esophagitis , PUD  . PUD (peptic ulcer disease)     diagnosed via EGD by Dr. Braulio Bosch at [place]  . Diverticulosis   . Hemorrhoids   . Coronary artery disease      a. BMS to LAD 1999. b. HSRA to LAD and stent to RCA 2007. c. NSTEMI/CABG x 3 in 04/2010. d. NSTEMI 10/2013 - secondary to demand ischemia in setting of COPD exacerbation with severe underlying CAD - cath with 3/3 patent grafts, for medical therapy.   [name] COPD (chronic obstructive pulmonary disease)     a. Ongoing tobacco abuse.  . Arthritis   . Neutropenia     a. Dx 10/2013 on labs - instructed to f/u PCP.  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   . Ischemic cardiomyopathy     a. previously EF 30% in 2011. b. 10/2013: EF 45-50% by echo.  . Alcoholism     a. Remote alcoholism  . 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   Past Surgical History  Procedure Laterality Date  . Coronary artery bypass graft  June 2011    X3  . Right inguinal hernia repair    . Cholecystectomy      3-4 years ago  . Appendectomy      age 30  . Cataracts    . Colonoscopy  05/12/2002    Dr. [name]- diverticulosis, anal papilla, internal hemorrhoids, rectal polyps  . [name] dilation  04/16/2012    Schatzki's ring-status post dilation and disruption/ as described above. Grade 1 esophageal varices. Small hiatal hernia.  [name] dilation  04/16/2012    Procedure: [name] DILATION;  Surgeon: Daneil Dolin, MD;  Location: [facility];  Service: Endoscopy;  Laterality: N/A;  . Colonoscopy  08/10/2012    Procedure: COLONOSCOPY;  Surgeon: Daneil Dolin, MD;  Location: [facility];  Service: Endoscopy;  Laterality: N/A;  9:45 needs 30 mins extra /have Glucagon on hand  . Left heart catheterization with coronary/graft angiogram N/A 10/29/2013    Procedure:  LEFT HEART CATHETERIZATION WITH [name];  Surgeon: Burnell Blanks, MD;  Location: [facility] CATH LAB;  Service: Cardiovascular;  Laterality: N/A;  . Abdominal aortagram N/A 09/09/2014    Procedure: ABDOMINAL Maxcine Ham;  Surgeon: Elam Dutch, MD;  Location: [facility] CATH LAB;  Service: Cardiovascular;  Laterality: N/A;  . Atherectomy Bilateral 09/23/2014    Procedure: ATHERECTOMY;  Surgeon: Elam Dutch, MD;  Location: [facility] CATH LAB;  Service: Cardiovascular;  Laterality: Bilateral;  iliacs  . Amputation Left 12/05/2014    Procedure: AMPUTATION ABOVE KNEE;  Surgeon: Elam Dutch, MD;  Location: [facility] OR;  Service: Vascular;  Laterality: Left;   Family History  Problem Relation Age of Onset  . Coronary artery disease  Mother   . Coronary artery disease Father   . Diabetes Sister   . Suicidality Brother   . Cirrhosis Brother   . Colon cancer Neg Hx    History  Substance Use Topics  . Smoking status: Current Every Day Smoker -- 0.75 packs/day for 63 years    Types: Cigarettes  . Smokeless tobacco: Never Used  . Alcohol Use: No     Comment: Quit 1985    Review of Systems  Respiratory: Positive for shortness of breath.   Cardiovascular: Positive for leg swelling.  Skin: Positive for wound.  All other systems reviewed and are negative.     Allergies  Levaquin  Home Medications   Prior to Admission medications   Medication Sig Start Date End Date Taking? Authorizing Provider  albuterol-ipratropium (COMBIVENT) 18-103 MCG/ACT inhaler Inhale 1-2 puffs into the lungs every 6 (six) hours as needed for wheezing or shortness of breath. 11/02/13  Yes Dayna N Dunn, PA-C  aspirin EC 81 MG tablet Take 1 tablet (81 mg total) by mouth daily. 12/31/14  Yes Kathie Dike, MD  atorvastatin (LIPITOR) 40 MG tablet Take 40 mg by mouth daily.   Yes Historical Provider, MD  citalopram (CELEXA) 20 MG tablet Take 20 mg by mouth daily.   Yes Historical Provider, MD  clopidogrel (PLAVIX) 75 MG tablet Take 75 mg by mouth daily.   Yes Historical Provider, MD  fenofibrate 160 MG tablet Take 160 mg by mouth daily with breakfast.    Yes Historical Provider, MD  furosemide (LASIX) 20 MG tablet Take 40 mg by mouth daily.   Yes Historical Provider, MD  gabapentin (NEURONTIN) 100 MG capsule Take 300 mg by mouth at bedtime.  06/08/14  Yes Historical Provider, MD  glipiZIDE (GLUCOTROL XL) 2.5 MG 24 hr tablet Take 2.5 mg by mouth 2 (two) times daily.    Yes Historical Provider, MD  insulin glargine (LANTUS) 100 UNIT/ML injection Inject 0.15 mLs (15 Units total) into the skin daily. Patient taking differently: Inject 10-30 Units into the skin daily as needed (for elevated blood sugar levels).  12/09/14  Yes Albertine Patricia, MD    isosorbide mononitrate (IMDUR) 30 MG 24 hr tablet Take 1 tablet (30 mg total) by mouth daily. 08/19/14  Yes Samuella Cota, MD  levalbuterol Penne Lash) 1.25 MG/3ML nebulizer solution Take 0.63 mg by nebulization every 4 (four) hours as needed for wheezing or shortness of breath. 07/14/14  Yes Shanker Kristeen Mans, MD  lisinopril (PRINIVIL,ZESTRIL) 20 MG tablet Take 40 mg by mouth daily.    Yes Historical Provider, MD  metFORMIN (GLUCOPHAGE) 1000 MG tablet Take 1,000 mg by mouth every morning.    Yes Historical Provider, MD  metolazone (ZAROXOLYN) 2.5 MG tablet TAKE ONE (1) TABLET EACH DAY  10/03/14  Yes Lendon Colonel, NP  metoprolol tartrate (LOPRESSOR) 25 MG tablet Take 25 mg by mouth 2 (two) times daily.    Yes Historical Provider, MD  nitroGLYCERIN (NITROSTAT) 0.4 MG SL tablet Place 0.4 mg under the tongue every 5 (five) minutes x 3 doses as needed for chest pain.    Yes Historical Provider, MD  OXYGEN Inhale 3 L into the lungs daily as needed (shortess of breath).   Yes Historical Provider, MD  Pancrelipase, Lip-Prot-Amyl, (CREON) 24000 UNITS CPEP Take 48,000-72,000 Units by mouth See admin instructions. Take 72000 units 3 times daily with all meals and take 48000 units with snacks   Yes Historical Provider, MD  pantoprazole (PROTONIX) 40 MG tablet Take 1 tablet (40 mg total) by mouth daily. 11/02/13  Yes Dayna N Dunn, PA-C  spironolactone (ALDACTONE) 25 MG tablet Take 25 mg by mouth daily.     Yes Historical Provider, MD  SYMBICORT 160-4.5 MCG/ACT inhaler Inhale 1-2 puffs into the lungs 2 (two) times daily as needed (shorntess of breath).  07/04/12  Yes Historical Provider, MD  traZODone (DESYREL) 50 MG tablet Take 50 mg by mouth at bedtime.   Yes Historical Provider, MD  Amino Acids-Protein Hydrolys (FEEDING SUPPLEMENT, PRO-STAT SUGAR FREE 64,) LIQD Take 30 mLs by mouth 3 (three) times daily with meals. Patient not taking: Reported on 02/10/2015 12/09/14   Albertine Patricia, MD  azithromycin  (ZITHROMAX) 250 MG tablet Take one tablet po daily for 3 more days Patient not taking: Reported on 02/10/2015 12/31/14   Kathie Dike, MD  HYDROcodone-acetaminophen (NORCO/VICODIN) 5-325 MG per tablet Take 1 tablet by mouth 4 (four) times daily as needed for moderate pain. Patient not taking: Reported on 02/10/2015 12/09/14   Silver Huguenin Elgergawy, MD  insulin aspart (NOVOLOG) 100 UNIT/ML injection Inject 0-15 Units into the skin 3 (three) times daily with meals. Patient not taking: Reported on 02/10/2015 12/09/14   Silver Huguenin Elgergawy, MD  loratadine (CLARITIN) 10 MG tablet Take 10 mg by mouth daily.    Historical Provider, MD  predniSONE (DELTASONE) 10 MG tablet Take 40mg  po daily for 2 days then 30mg  po daily for 2 days then 20mg  po daily for 2 days then 10mg  po daily for 2 days then stop Patient not taking: Reported on 02/10/2015 12/31/14   Kathie Dike, MD   BP 116/58 mmHg  Pulse 72  Temp(Src) 98.1 F (36.7 C) (Oral)  Resp 18  Ht 5\' 10"  (1.778 m)  Wt 150 lb 1.6 oz (68.085 kg)  BMI 21.54 kg/m2  SpO2 96% Physical Exam  Constitutional: He is oriented to person, place, and time. He appears well-developed and well-nourished. No distress.  HENT:  Head: Normocephalic and atraumatic.  Right Ear: Hearing normal.  Left Ear: Hearing normal.  Nose: Nose normal.  Mouth/Throat: Oropharynx is clear and moist and mucous membranes are normal.  Eyes: Conjunctivae and EOM are normal. Pupils are equal, round, and reactive to light.  Neck: Normal range of motion. Neck supple.  Cardiovascular: Regular rhythm, S1 normal and S2 normal.  Exam reveals no gallop and no friction rub.   No murmur heard. Pulmonary/Chest: Effort normal and breath sounds normal. No respiratory distress. He exhibits no tenderness.  Abdominal: Soft. Normal appearance and bowel sounds are normal. There is no hepatosplenomegaly. There is no tenderness. There is no rebound, no guarding, no tenderness at McBurney's point and negative Murphy's  sign. No hernia.  Musculoskeletal: Normal range of motion. He exhibits edema (right lower leg).  Neurological: He is alert and oriented to person, place, and time. He has normal strength. No cranial nerve deficit or sensory deficit. Coordination normal. GCS eye subscore is 4. GCS verbal subscore is 5. GCS motor subscore is 6.  Skin: Skin is warm and dry. No rash noted. No cyanosis.     Psychiatric: He has a normal mood and affect. His speech is normal and behavior is normal. Thought content normal.  Nursing note and vitals reviewed.   ED Course  Procedures (including critical care time) Labs Review Labs Reviewed  CBC WITH DIFFERENTIAL/PLATELET - Abnormal; Notable for the following:    RBC 3.80 (*)    Hemoglobin 11.3 (*)    HCT 33.3 (*)    RDW 16.2 (*)    All other components within normal limits  BASIC METABOLIC PANEL - Abnormal; Notable for the following:    Glucose, Bld 216 (*)    All other components within normal limits  TROPONIN I - Abnormal; Notable for the following:    Troponin I 0.04 (*)    All other components within normal limits  BRAIN NATRIURETIC PEPTIDE - Abnormal; Notable for the following:    B Natriuretic Peptide 848.5 (*)    All other components within normal limits  TROPONIN I - Abnormal; Notable for the following:    Troponin I 0.05 (*)    All other components within normal limits  TROPONIN I - Abnormal; Notable for the following:    Troponin I 0.08 (*)    All other components within normal limits  TROPONIN I - Abnormal; Notable for the following:    Troponin I 0.04 (*)    All other components within normal limits  BASIC METABOLIC PANEL - Abnormal; Notable for the following:    Potassium 3.2 (*)    Glucose, Bld 132 (*)    Calcium 8.2 (*)    All other components within normal limits  CBC - Abnormal; Notable for the following:    RBC 3.15 (*)    Hemoglobin 9.3 (*)    HCT 28.2 (*)    RDW 16.3 (*)    All other components within normal limits  GLUCOSE,  CAPILLARY - Abnormal; Notable for the following:    Glucose-Capillary 252 (*)    All other components within normal limits  GLUCOSE, CAPILLARY - Abnormal; Notable for the following:    Glucose-Capillary 107 (*)    All other components within normal limits  GLUCOSE, CAPILLARY - Abnormal; Notable for the following:    Glucose-Capillary 226 (*)    All other components within normal limits  GLUCOSE, CAPILLARY - Abnormal; Notable for the following:    Glucose-Capillary 208 (*)    All other components within normal limits  WOUND CULTURE  CULTURE, BLOOD (ROUTINE X 2)  CULTURE, BLOOD (ROUTINE X 2)  I-STAT CG4 LACTIC ACID, ED    Imaging Review Dg Chest 2 View  02/10/2015   CLINICAL DATA:  Chest pain and difficulty breathing 2 days. Cough and shortness of breath with fever and nausea.  EXAM: CHEST  2 VIEW  COMPARISON:  01/30/2015 and 12/30/2014.  FINDINGS: Lungs are adequately inflated and otherwise clear. Cardiomediastinal silhouette and remainder of the exam is unchanged.  IMPRESSION: No active cardiopulmonary disease.   Electronically Signed   By: Marin Olp M.D.   On: 02/10/2015 18:19   Dg Foot Complete Right  02/10/2015   CLINICAL DATA:  Right foot and leg swelling. Open sores on plantar surface.  EXAM: RIGHT FOOT COMPLETE -  3+ VIEW  COMPARISON:  None.  FINDINGS: Mild diffuse decreased bone mineralization. Moderate soft tissue swelling over the dorsum of the foot. No bone destruction to suggest osteomyelitis. Small vessel atherosclerotic disease is present. Small inferior calcaneal spur.  IMPRESSION: Moderate soft tissue swelling over the dorsum of the foot. No evidence of osteomyelitis.   Electronically Signed   By: Marin Olp M.D.   On: 02/10/2015 18:21     EKG Interpretation   Date/Time:  Friday February 10 2015 17:19:54 EDT Ventricular Rate:  82 PR Interval:  158 QRS Duration: 135 QT Interval:  411 QTC Calculation: 480 R Axis:   -101 Text Interpretation:  Sinus rhythm Nonspecific  IVCD with LAD Anterior  infarct, old No significant change since last tracing Confirmed by Ardene Remley   MD, Rmani Kellogg [phone]) on 02/10/2015 5:24:12 PM      MDM   Final diagnoses:  Shortness of breath   cellulitis  Presents to the ER for evaluation of pain and swelling of the right foot and lower leg. There is associated warmth and redness. Patient has a chronic ulceration on the plantar aspect of his great toe that appears to be a source of infection. He appears to have isolated soft tissue infection consistent with cellulitis, no obvious osteomyelitis. He does not appear to be septic. He does have a history of COPD and has had some cough recently, workup is negative. Patient will require hospitalization for further management.    Orpah Greek, MD 02/11/15 [phone]

## 2015-02-10 NOTE — Progress Notes (Signed)
*  Preliminary Results* Right lower extremity venous duplex completed. Right lower extremity is negative for deep vein thrombosis. There is no evidence of right Baker's cyst.  02/10/2015 6:11 PM  Maudry Mayhew, RVT, RDCS, RDMS

## 2015-02-10 NOTE — ED Notes (Signed)
Attempted IV start, unsuccessful. Will have second RN attempt.

## 2015-02-11 LAB — CBC
HCT: 28.2 % — ABNORMAL LOW (ref 39.0–52.0)
Hemoglobin: 9.3 g/dL — ABNORMAL LOW (ref 13.0–17.0)
MCH: 29.5 pg (ref 26.0–34.0)
MCHC: 33 g/dL (ref 30.0–36.0)
MCV: 89.5 fL (ref 78.0–100.0)
Platelets: 265 10*3/uL (ref 150–400)
RBC: 3.15 MIL/uL — AB (ref 4.22–5.81)
RDW: 16.3 % — ABNORMAL HIGH (ref 11.5–15.5)
WBC: 6.6 10*3/uL (ref 4.0–10.5)

## 2015-02-11 LAB — GLUCOSE, CAPILLARY
GLUCOSE-CAPILLARY: 107 mg/dL — AB (ref 70–99)
Glucose-Capillary: 226 mg/dL — ABNORMAL HIGH (ref 70–99)
Glucose-Capillary: 208 mg/dL — ABNORMAL HIGH (ref 70–99)
Glucose-Capillary: 244 mg/dL — ABNORMAL HIGH (ref 70–99)

## 2015-02-11 LAB — TROPONIN I
Troponin I: 0.08 ng/mL — ABNORMAL HIGH (ref <0.031)
Troponin I: 0.04 ng/mL — ABNORMAL HIGH (ref <0.031)

## 2015-02-11 LAB — BASIC METABOLIC PANEL
Anion gap: 8 (ref 5–15)
BUN: 6 mg/dL (ref 6–23)
CALCIUM: 8.2 mg/dL — AB (ref 8.4–10.5)
CO2: 30 mmol/L (ref 19–32)
CREATININE: 0.59 mg/dL (ref 0.50–1.35)
Chloride: 99 mmol/L (ref 96–112)
GFR calc Af Amer: >90 mL/min (ref >90)
Glucose, Bld: 132 mg/dL — ABNORMAL HIGH (ref 70–99)
Potassium: 3.2 mmol/L — ABNORMAL LOW (ref 3.5–5.1)
SODIUM: 137 mmol/L (ref 135–145)

## 2015-02-11 MED ORDER — BENZONATATE 100 MG PO CAPS
100.0000 mg | ORAL_CAPSULE | Freq: Three times a day (TID) | ORAL | Status: DC | PRN
  Filled 2015-02-11: qty 1

## 2015-02-11 MED ORDER — DOXYCYCLINE HYCLATE 100 MG PO TABS
100.0000 mg | ORAL_TABLET | Freq: Two times a day (BID) | ORAL | Status: DC
  Administered 2015-02-11 – 2015-02-12 (×2): 100 mg via ORAL
  Filled 2015-02-11 (×3): qty 1

## 2015-02-11 MED ORDER — CETYLPYRIDINIUM CHLORIDE 0.05 % MT LIQD
7.0000 mL | Freq: Two times a day (BID) | OROMUCOSAL | Status: DC
  Administered 2015-02-11 – 2015-02-12 (×3): 7 mL via OROMUCOSAL

## 2015-02-11 MED ORDER — CEPHALEXIN 500 MG PO CAPS
500.0000 mg | ORAL_CAPSULE | Freq: Two times a day (BID) | ORAL | Status: DC
  Administered 2015-02-11 – 2015-02-12 (×2): 500 mg via ORAL
  Filled 2015-02-11 (×3): qty 1

## 2015-02-11 MED ORDER — IPRATROPIUM-ALBUTEROL 0.5-2.5 (3) MG/3ML IN SOLN
3.0000 mL | Freq: Four times a day (QID) | RESPIRATORY_TRACT | Status: DC | PRN
  Administered 2015-02-11 – 2015-02-12 (×2): 3 mL via RESPIRATORY_TRACT
  Filled 2015-02-11: qty 3

## 2015-02-11 NOTE — Progress Notes (Signed)
Pt arrived to unit alert and oriented x4. Oriented to room, unit, and staff.  Bed in lowest position and call bell is within reach. Will continue to monitor. 

## 2015-02-11 NOTE — Progress Notes (Signed)
[facility] PROGRESS NOTE  Stephen [name] SWN:[id number] DOB: 1941/09/15 DOA: 02/10/2015 PCP: Reynold Bowen, NT  Assessment/Plan: Principal Problem:   Cellulitis and abscess of leg - improving on current antibiotic regimen - on vanc and zosyn but given improvement in condition we'll de-escalate doxycycline and Keppra  Active Problems:   COPD II/III still smoking  - Stable, continue current regimen    PVD (peripheral vascular disease) - Continue statin    Systolic CHF, chronic - imdur, lisinopril, metoprolol, spironolactone - stable    HTN (hypertension) - continue current antihypertensive regimen    Troponin level elevated - trending down, no chest pain reported    Type 2 diabetes mellitus with other specified complication - currently on lantus, ssi, night coverage - diabetic diet  Code Status: full Family Communication: No family at bedside Disposition Plan: pending improvement in condition   Consultants:  None  Procedures:  none  Antibiotics:  Vancomycin and zosyn>>>4/9  Keflex and doxycycline  HPI/Subjective: Patient states he feels better. Would like to know when he can go home  Objective: Filed Vitals:   02/11/15 1328  BP: 116/58  Pulse: 72  Temp: 98.1 F (36.7 C)  Resp: 18    Intake/Output Summary (Last 24 hours) at 02/11/15 1820 Last data filed at 02/11/15 1713  Gross per 24 hour  Intake    450 ml  Output    700 ml  Net   -250 ml   Filed Weights   02/10/15 1915 02/10/15 2242  Weight: 68 kg (149 lb 14.6 oz) 68.085 kg (150 lb 1.6 oz)    Exam:   General:  Patient in no acute distress, alert and awake  Cardiovascular: Regular rate and rhythm, no murmurs or rubs  Respiratory: Clear to auscultation bilaterally, no wheezes  Abdomen: soft, nt, nd  Musculoskeletal: no cyanosis or clubbing, cellulitis at right LE   Data Reviewed: Basic Metabolic Panel:  Recent Labs Lab 02/10/15 1705 02/11/15 0419  NA 135 137  K 3.6 3.2*   CL 96 99  CO2 30 30  GLUCOSE 216* 132*  BUN 7 6  CREATININE 0.59 0.59  CALCIUM 8.8 8.2*   Liver Function Tests: No results for input(s): AST, ALT, ALKPHOS, BILITOT, PROT, ALBUMIN in the last 168 hours. No results for input(s): LIPASE, AMYLASE in the last 168 hours. No results for input(s): AMMONIA in the last 168 hours. CBC:  Recent Labs Lab 02/10/15 1705 02/11/15 0419  WBC 6.3 6.6  NEUTROABS 4.8  --   HGB 11.3* 9.3*  HCT 33.3* 28.2*  MCV 87.6 89.5  PLT 321 265   Cardiac Enzymes:  Recent Labs Lab 02/10/15 1705 02/10/15 2251 02/11/15 0419 02/11/15 0941  TROPONINI 0.04* 0.05* 0.08* 0.04*   BNP (last 3 results)  Recent Labs  12/30/14 1406 02/10/15 1706  BNP 293.0* 848.5*    ProBNP (last 3 results)  Recent Labs  07/21/14 2348 08/16/14 1351 08/17/14 0609  PROBNP 3366.0* 6508.0* 9230.0*    CBG:  Recent Labs Lab 02/10/15 2249 02/11/15 0751 02/11/15 1206 02/11/15 1705  GLUCAP 252* 107* 226* 208*    Recent Results (from the past 240 hour(s))  Wound culture     Status: None (Preliminary result)   Collection Time: 02/10/15 10:58 PM  Result Value Ref Range Status   Specimen Description WOUND RIGHT FOOT  Final   Special Requests NONE  Final   Gram Stain   Final    RARE WBC PRESENT,BOTH PMN AND MONONUCLEAR NO SQUAMOUS EPITHELIAL CELLS SEEN  FEW GRAM POSITIVE COCCI IN PAIRS IN CLUSTERS RARE GRAM POSITIVE RODS Performed at [facility]    Culture PENDING  Incomplete   Report Status PENDING  Incomplete     Studies: Dg Chest 2 View  02/10/2015   CLINICAL DATA:  Chest pain and difficulty breathing 2 days. Cough and shortness of breath with fever and nausea.  EXAM: CHEST  2 VIEW  COMPARISON:  01/30/2015 and 12/30/2014.  FINDINGS: Lungs are adequately inflated and otherwise clear. Cardiomediastinal silhouette and remainder of the exam is unchanged.  IMPRESSION: No active cardiopulmonary disease.   Electronically Signed   By: Marin Olp M.D.    On: 02/10/2015 18:19   Dg Foot Complete Right  02/10/2015   CLINICAL DATA:  Right foot and leg swelling. Open sores on plantar surface.  EXAM: RIGHT FOOT COMPLETE - 3+ VIEW  COMPARISON:  None.  FINDINGS: Mild diffuse decreased bone mineralization. Moderate soft tissue swelling over the dorsum of the foot. No bone destruction to suggest osteomyelitis. Small vessel atherosclerotic disease is present. Small inferior calcaneal spur.  IMPRESSION: Moderate soft tissue swelling over the dorsum of the foot. No evidence of osteomyelitis.   Electronically Signed   By: Marin Olp M.D.   On: 02/10/2015 18:21    Scheduled Meds: . antiseptic oral rinse  7 mL Mouth Rinse BID  . aspirin EC  81 mg Oral Daily  . atorvastatin  40 mg Oral Daily  . citalopram  20 mg Oral Daily  . clopidogrel  75 mg Oral Daily  . fenofibrate  160 mg Oral Q breakfast  . furosemide  40 mg Oral Daily  . heparin  5,000 Units Subcutaneous 3 times per day  . insulin aspart  0-5 Units Subcutaneous QHS  . insulin aspart  0-9 Units Subcutaneous TID WC  . insulin glargine  15 Units Subcutaneous Daily  . isosorbide mononitrate  30 mg Oral Daily  . lipase/protease/amylase  48,000 Units Oral TID WC  . lisinopril  40 mg Oral Daily  . metolazone  2.5 mg Oral Daily  . metoprolol tartrate  25 mg Oral BID  . pantoprazole  40 mg Oral Daily  . piperacillin-tazobactam (ZOSYN)  IV  3.375 g Intravenous Q8H  . sodium chloride  3 mL Intravenous Q12H  . sodium chloride  3 mL Intravenous Q12H  . spironolactone  25 mg Oral Daily  . traZODone  50 mg Oral QHS  . vancomycin  750 mg Intravenous Q12H   Continuous Infusions:    Time spent: > 35 minutes    Velvet Bathe  [facility] Pager [phone]. If 7PM-7AM, please contact night-coverage at [email or web address], password [facility] 02/11/2015, 6:20 PM  LOS: 1 day

## 2015-02-12 LAB — GLUCOSE, CAPILLARY
GLUCOSE-CAPILLARY: 134 mg/dL — AB (ref 70–99)
GLUCOSE-CAPILLARY: 149 mg/dL — AB (ref 70–99)
Glucose-Capillary: 59 mg/dL — ABNORMAL LOW (ref 70–99)

## 2015-02-12 MED ORDER — CEPHALEXIN 500 MG PO CAPS: 500.0000 mg | ORAL_CAPSULE | Freq: Two times a day (BID) | ORAL | Status: DC

## 2015-02-12 MED ORDER — POTASSIUM CHLORIDE CRYS ER 20 MEQ PO TBCR
40.0000 meq | EXTENDED_RELEASE_TABLET | Freq: Once | ORAL | Status: AC
  Administered 2015-02-12: 40 meq via ORAL
  Filled 2015-02-12: qty 2

## 2015-02-12 MED ORDER — DOXYCYCLINE HYCLATE 100 MG PO TABS: 100.0000 mg | ORAL_TABLET | Freq: Two times a day (BID) | ORAL | Status: DC

## 2015-02-12 NOTE — Discharge Summary (Signed)
Physician Discharge Summary  Stephen [name] PPI:[id number] DOB: 1940-12-05 DOA: 02/10/2015  PCP: Reynold Bowen, NT  Admit date: 02/10/2015 Discharge date: 02/12/2015  Time spent: > 35 minutes  Recommendations for Outpatient Follow-up:  1. Please be sure to reassess cellulitis and assure the patient continues to get better. Will treat cellulitis for 7 days please consider continuing antibiotic regimen should patient require it on post hospital follow-up   Discharge Diagnoses:  Principal Problem:   Cellulitis and abscess of leg Active Problems:   COPD II/III still smoking    PVD (peripheral vascular disease)   Systolic CHF, chronic   HTN (hypertension)   Troponin level elevated   Foot ulcer, right   Coronary artery disease   Type 2 diabetes mellitus with other specified complication   Discharge Condition: stable  Diet recommendation: heart healthy diet.  Filed Weights   02/10/15 1915 02/10/15 2242  Weight: 68 kg (149 lb 14.6 oz) 68.085 kg (150 lb 1.6 oz)    History of present illness:  The patient is a 74 year old with history of DM type II, PPD, O2 dependent COPD, chronic systolic CHF who was sent to the ED by orthopedic surgeon and his podiatrist due to increased redness and swelling of his right lower extremity and worsening of his ulcer on his right foot for the past 3 or 4 days.   Hospital Course:   Principal Problem:   Cellulitis - Patient reports daily improvement. We'll continue current oral antibiotic regimen doxycycline and Keflex. Wound culture growing some staph aureus. - Patient has remained afebrile and white blood cell count within normal limits - We'll recommend patient follow-up with his podiatrist or primary care physician for further evaluation of right lower extremity cellulitis. Will treat for a total of 7 days - While in house patient received 2 days of IV antibiotics with vancomycin and Zosyn  Active Problems:   COPD II/III still smoking  - Stable  patient to continue home medication regimen    PVD (peripheral vascular disease)   Systolic CHF, chronic   HTN (hypertension) - Stable continue home regimen   Troponin level elevated - Most likely due to demand ischemia. No chest pain reported on day of discharge. Patient requesting and looking forward to discharge   Foot ulcer, right - Recommend patient follow-up with podiatrist for further evaluation recommendations   Coronary artery disease - We'll continue aspirin and Plavix - Also continue statin on discharge   Type 2 diabetes mellitus with other specified complication - Stable we'll have patient continue home medication regimen  Procedures:  None  Consultations:  None  Discharge Exam: Filed Vitals:   02/12/15 1352  BP: 100/45  Pulse: 69  Temp: 97.9 F (36.6 C)  Resp: 18    General: Pt in nad, alert and awake Cardiovascular: rrr, no rubs Respiratory: cta bl, no wheezes, prolonged expiratory phase, breathing comfortably with [place] not in place (on forehead) Skin: RLE cellulitis improved from yesterday  Discharge Instructions   Discharge Instructions    Call MD for:  difficulty breathing, headache or visual disturbances    Complete by:  As directed      Call MD for:  extreme fatigue    Complete by:  As directed      Call MD for:  redness, tenderness, or signs of infection (pain, swelling, redness, odor or green/yellow discharge around incision site)    Complete by:  As directed      Call MD for:  temperature >100.4  Complete by:  As directed      Diet - low sodium heart healthy    Complete by:  As directed      Discharge instructions    Complete by:  As directed   Recommend you follow-up with your primary care physician within the next week     Increase activity slowly    Complete by:  As directed           Current Discharge Medication List    START taking these medications   Details  cephALEXin (KEFLEX) 500 MG capsule Take 1 capsule (500 mg total)  by mouth every 12 (twelve) hours. Qty: 10 capsule, Refills: 0    doxycycline (VIBRA-TABS) 100 MG tablet Take 1 tablet (100 mg total) by mouth every 12 (twelve) hours. Qty: 10 tablet, Refills: 0      CONTINUE these medications which have NOT CHANGED   Details  albuterol-ipratropium (COMBIVENT) 18-103 MCG/ACT inhaler Inhale 1-2 puffs into the lungs every 6 (six) hours as needed for wheezing or shortness of breath.    aspirin EC 81 MG tablet Take 1 tablet (81 mg total) by mouth daily. Qty: 30 tablet, Refills: 1    atorvastatin (LIPITOR) 40 MG tablet Take 40 mg by mouth daily.    citalopram (CELEXA) 20 MG tablet Take 20 mg by mouth daily.    clopidogrel (PLAVIX) 75 MG tablet Take 75 mg by mouth daily.    fenofibrate 160 MG tablet Take 160 mg by mouth daily with breakfast.     furosemide (LASIX) 20 MG tablet Take 40 mg by mouth daily.    gabapentin (NEURONTIN) 100 MG capsule Take 300 mg by mouth at bedtime.     glipiZIDE (GLUCOTROL XL) 2.5 MG 24 hr tablet Take 2.5 mg by mouth 2 (two) times daily.     insulin glargine (LANTUS) 100 UNIT/ML injection Inject 0.15 mLs (15 Units total) into the skin daily. Qty: 10 mL, Refills: 11    isosorbide mononitrate (IMDUR) 30 MG 24 hr tablet Take 1 tablet (30 mg total) by mouth daily. Qty: 30 tablet, Refills: 0    levalbuterol (XOPENEX) 1.25 MG/3ML nebulizer solution Take 0.63 mg by nebulization every 4 (four) hours as needed for wheezing or shortness of breath. Qty: 72 mL, Refills: 0    lisinopril (PRINIVIL,ZESTRIL) 20 MG tablet Take 40 mg by mouth daily.     metFORMIN (GLUCOPHAGE) 1000 MG tablet Take 1,000 mg by mouth every morning.     metolazone (ZAROXOLYN) 2.5 MG tablet TAKE ONE (1) TABLET EACH DAY Qty: 30 tablet, Refills: 0    metoprolol tartrate (LOPRESSOR) 25 MG tablet Take 25 mg by mouth 2 (two) times daily.     nitroGLYCERIN (NITROSTAT) 0.4 MG SL tablet Place 0.4 mg under the tongue every 5 (five) minutes x 3 doses as needed for  chest pain.     OXYGEN Inhale 3 L into the lungs daily as needed (shortess of breath).    Pancrelipase, Lip-Prot-Amyl, (CREON) 24000 UNITS CPEP Take 48,000-72,000 Units by mouth See admin instructions. Take 72000 units 3 times daily with all meals and take 48000 units with snacks    pantoprazole (PROTONIX) 40 MG tablet Take 1 tablet (40 mg total) by mouth daily. Qty: 30 tablet, Refills: 1    spironolactone (ALDACTONE) 25 MG tablet Take 25 mg by mouth daily.      SYMBICORT 160-4.5 MCG/ACT inhaler Inhale 1-2 puffs into the lungs 2 (two) times daily as needed (shorntess of breath).  traZODone (DESYREL) 50 MG tablet Take 50 mg by mouth at bedtime.    Amino Acids-Protein Hydrolys (FEEDING SUPPLEMENT, PRO-STAT SUGAR FREE 64,) LIQD Take 30 mLs by mouth 3 (three) times daily with meals. Qty: 900 mL, Refills: 0    HYDROcodone-acetaminophen (NORCO/VICODIN) 5-325 MG per tablet Take 1 tablet by mouth 4 (four) times daily as needed for moderate pain. Qty: 30 tablet, Refills: 0    insulin aspart (NOVOLOG) 100 UNIT/ML injection Inject 0-15 Units into the skin 3 (three) times daily with meals. Qty: 10 mL, Refills: 11    loratadine (CLARITIN) 10 MG tablet Take 10 mg by mouth daily.      STOP taking these medications     azithromycin (ZITHROMAX) 250 MG tablet      predniSONE (DELTASONE) 10 MG tablet        Allergies  Allergen Reactions  . Levaquin [Levofloxacin] Hives      The results of significant diagnostics from this hospitalization (including imaging, microbiology, ancillary and laboratory) are listed below for reference.    Significant Diagnostic Studies: Dg Chest 2 View  02/10/2015   CLINICAL DATA:  Chest pain and difficulty breathing 2 days. Cough and shortness of breath with fever and nausea.  EXAM: CHEST  2 VIEW  COMPARISON:  01/30/2015 and 12/30/2014.  FINDINGS: Lungs are adequately inflated and otherwise clear. Cardiomediastinal silhouette and remainder of the exam is  unchanged.  IMPRESSION: No active cardiopulmonary disease.   Electronically Signed   By: Marin Olp M.D.   On: 02/10/2015 18:19   Dg Foot Complete Right  02/10/2015   CLINICAL DATA:  Right foot and leg swelling. Open sores on plantar surface.  EXAM: RIGHT FOOT COMPLETE - 3+ VIEW  COMPARISON:  None.  FINDINGS: Mild diffuse decreased bone mineralization. Moderate soft tissue swelling over the dorsum of the foot. No bone destruction to suggest osteomyelitis. Small vessel atherosclerotic disease is present. Small inferior calcaneal spur.  IMPRESSION: Moderate soft tissue swelling over the dorsum of the foot. No evidence of osteomyelitis.   Electronically Signed   By: Marin Olp M.D.   On: 02/10/2015 18:21    Microbiology: Recent Results (from the past 240 hour(s))  Culture, blood (routine x 2)     Status: None (Preliminary result)   Collection Time: 02/10/15  5:06 PM  Result Value Ref Range Status   Specimen Description BLOOD ARM RIGHT  Final   Special Requests BOTTLES DRAWN AEROBIC AND ANAEROBIC 5CC  Final   Culture   Final           BLOOD CULTURE RECEIVED NO GROWTH TO DATE CULTURE WILL BE HELD FOR 5 DAYS BEFORE ISSUING A FINAL NEGATIVE REPORT Performed at [facility]    Report Status PENDING  Incomplete  Culture, blood (routine x 2)     Status: None (Preliminary result)   Collection Time: 02/10/15  5:14 PM  Result Value Ref Range Status   Specimen Description BLOOD HAND RIGHT  Final   Special Requests BOTTLES DRAWN AEROBIC AND ANAEROBIC 5CC  Final   Culture   Final           BLOOD CULTURE RECEIVED NO GROWTH TO DATE CULTURE WILL BE HELD FOR 5 DAYS BEFORE ISSUING A FINAL NEGATIVE REPORT Performed at [facility]    Report Status PENDING  Incomplete  Wound culture     Status: None (Preliminary result)   Collection Time: 02/10/15 10:58 PM  Result Value Ref Range Status   Specimen Description WOUND RIGHT  FOOT  Final   Special Requests NONE  Final   Gram Stain   Final     RARE WBC PRESENT,BOTH PMN AND MONONUCLEAR NO SQUAMOUS EPITHELIAL CELLS SEEN FEW GRAM POSITIVE COCCI IN PAIRS IN CLUSTERS RARE GRAM POSITIVE RODS Performed at [facility]    Culture   Final    MODERATE STAPHYLOCOCCUS AUREUS Note: RIFAMPIN AND GENTAMICIN SHOULD NOT BE USED AS SINGLE DRUGS FOR TREATMENT OF STAPH INFECTIONS. Performed at [facility]    Report Status PENDING  Incomplete     Labs: Basic Metabolic Panel:  Recent Labs Lab 02/10/15 1705 02/11/15 0419  NA 135 137  K 3.6 3.2*  CL 96 99  CO2 30 30  GLUCOSE 216* 132*  BUN 7 6  CREATININE 0.59 0.59  CALCIUM 8.8 8.2*   Liver Function Tests: No results for input(s): AST, ALT, ALKPHOS, BILITOT, PROT, ALBUMIN in the last 168 hours. No results for input(s): LIPASE, AMYLASE in the last 168 hours. No results for input(s): AMMONIA in the last 168 hours. CBC:  Recent Labs Lab 02/10/15 1705 02/11/15 0419  WBC 6.3 6.6  NEUTROABS 4.8  --   HGB 11.3* 9.3*  HCT 33.3* 28.2*  MCV 87.6 89.5  PLT 321 265   Cardiac Enzymes:  Recent Labs Lab 02/10/15 1705 02/10/15 2251 02/11/15 0419 02/11/15 0941  TROPONINI 0.04* 0.05* 0.08* 0.04*   BNP: BNP (last 3 results)  Recent Labs  12/30/14 1406 02/10/15 1706  BNP 293.0* 848.5*    ProBNP (last 3 results)  Recent Labs  07/21/14 2348 08/16/14 1351 08/17/14 0609  PROBNP 3366.0* 6508.0* 9230.0*    CBG:  Recent Labs Lab 02/11/15 1705 02/11/15 2107 02/12/15 0752 02/12/15 0837 02/12/15 1221  GLUCAP 208* 244* 59* 134* 149*       Signed:  Velvet Bathe  [facility] 02/12/2015, 4:13 PM

## 2015-02-13 LAB — WOUND CULTURE

## 2015-02-17 LAB — CULTURE, BLOOD (ROUTINE X 2)
Culture: NO GROWTH
Culture: NO GROWTH

## 2015-03-08 ENCOUNTER — Telehealth: Payer: Self-pay

## 2015-03-08 NOTE — Telephone Encounter (Signed)
Rec'd phone call from the [facility].  Requested an urgent appt. for evaluation of right LE gangrene, and ischemic ulcer of (R) foot.  Discussed with Dr. [name] regarding need of repeating ABI's. (last ABI's done 10/19/15)  Per Dr. [name], do not repeat ABI's at this time; schedule office visit only.  Appt. scheduled for 03/09/15 @ 10:15 AM; pt. will be notified per phone of appt.  [place] to fax recent progress note.

## 2015-03-09 ENCOUNTER — Encounter: Payer: Self-pay | Admitting: Vascular Surgery

## 2015-03-09 ENCOUNTER — Ambulatory Visit (INDEPENDENT_AMBULATORY_CARE_PROVIDER_SITE_OTHER): Payer: Medicare Other | Admitting: Vascular Surgery

## 2015-03-09 VITALS — BP 170/59 | HR 75 | Temp 97.6°F | Ht 70.0 in | Wt 150.0 lb

## 2015-03-09 DIAGNOSIS — I739 Peripheral vascular disease, unspecified: Secondary | ICD-10-CM

## 2015-03-09 NOTE — Progress Notes (Signed)
HISTORY AND PHYSICAL   HPI: This is a 73 y.o. male who returns for evaluation of a nonhealing wound right foot. The patient was recently admitted to [facility] for cellulitis in the right leg. He still has some persistent edema. He has developed a new ulceration on the plantar aspect of his right first toe and metatarsal head. He is currently being followed at the wound center in [facility] by Dr. Nils Pyle.  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 He denies any drainage from the incision. He states that the wounds on his right leg have completely healed.  He is s/p  Aortogram with right EIA diamondback athrectomy, right EIA stent, left CIA and EIA athrectomy and left CIA and EIA stent 09/23/14.  He states the wounds on the right leg have healed.  he has a known chronic right superficial femoral artery occlusion.  He is on a statin for his hypercholesterolemia.  He is on a beta blocker and ACEI for his hypertension.  He is also diabetic and takes insulin as well as oral agents for this.     Current Outpatient Prescriptions on File Prior to Visit  Medication Sig Dispense Refill  . albuterol-ipratropium (COMBIVENT) 18-103 MCG/ACT inhaler Inhale 1-2 puffs into the lungs every 6 (six) hours as needed for wheezing or shortness of breath.    . Amino Acids-Protein Hydrolys (FEEDING SUPPLEMENT, PRO-STAT SUGAR FREE 64,) LIQD Take 30 mLs by mouth 3 (three) times daily with meals. 900 mL 0  . aspirin EC 81 MG tablet Take 1 tablet (81 mg total) by mouth daily. 30 tablet 1  . atorvastatin (LIPITOR) 40 MG tablet Take 40 mg by mouth daily.    . cephALEXin (KEFLEX) 500 MG capsule Take 1 capsule (500 mg total) by mouth every 12 (twelve) hours. 10 capsule 0  . citalopram (CELEXA) 20 MG tablet Take 20 mg by mouth daily.    . clopidogrel (PLAVIX) 75 MG tablet Take 75 mg by mouth daily.    [name] doxycycline (VIBRA-TABS) 100 MG tablet Take 1 tablet (100 mg total) by mouth every 12 (twelve) hours. 10  tablet 0  . fenofibrate 160 MG tablet Take 160 mg by mouth daily with breakfast.     . furosemide (LASIX) 20 MG tablet Take 40 mg by mouth daily.    [name] gabapentin (NEURONTIN) 100 MG capsule Take 300 mg by mouth at bedtime.     [name] glipiZIDE (GLUCOTROL XL) 2.5 MG 24 hr tablet Take 2.5 mg by mouth 2 (two) times daily.     [name] HYDROcodone-acetaminophen (NORCO/VICODIN) 5-325 MG per tablet Take 1 tablet by mouth 4 (four) times daily as needed for moderate pain. 30 tablet 0  . insulin aspart (NOVOLOG) 100 UNIT/ML injection Inject 0-15 Units into the skin 3 (three) times daily with meals. 10 mL 11  . insulin glargine (LANTUS) 100 UNIT/ML injection Inject 0.15 mLs (15 Units total) into the skin daily. (Patient taking differently: Inject 10-30 Units into the skin daily as needed (for elevated blood sugar levels). ) 10 mL 11  . isosorbide mononitrate (IMDUR) 30 MG 24 hr tablet Take 1 tablet (30 mg total) by mouth daily. 30 tablet 0  . levalbuterol (XOPENEX) 1.25 MG/3ML nebulizer solution Take 0.63 mg by nebulization every 4 (four) hours as needed for wheezing or shortness of breath. 72 mL 0  . lisinopril (PRINIVIL,ZESTRIL) 20 MG tablet Take 40 mg by mouth daily.     [name] loratadine (CLARITIN) 10 MG tablet Take 10  mg by mouth daily.    . metFORMIN (GLUCOPHAGE) 1000 MG tablet Take 1,000 mg by mouth every morning.     . metolazone (ZAROXOLYN) 2.5 MG tablet TAKE ONE (1) TABLET EACH DAY 30 tablet 0  . metoprolol tartrate (LOPRESSOR) 25 MG tablet Take 25 mg by mouth 2 (two) times daily.     . nitroGLYCERIN (NITROSTAT) 0.4 MG SL tablet Place 0.4 mg under the tongue every 5 (five) minutes x 3 doses as needed for chest pain.     . OXYGEN Inhale 3 L into the lungs daily as needed (shortess of breath).    . Pancrelipase, Lip-Prot-Amyl, (CREON) 24000 UNITS CPEP Take 48,000-72,000 Units by mouth See admin instructions. Take 72000 units 3 times daily with all meals and take 48000 units with snacks    . pantoprazole (PROTONIX) 40  MG tablet Take 1 tablet (40 mg total) by mouth daily. 30 tablet 1  . spironolactone (ALDACTONE) 25 MG tablet Take 25 mg by mouth daily.      . SYMBICORT 160-4.5 MCG/ACT inhaler Inhale 1-2 puffs into the lungs 2 (two) times daily as needed (shorntess of breath).     . traZODone (DESYREL) 50 MG tablet Take 50 mg by mouth at bedtime.     No current facility-administered medications on file prior to visit.    Past Surgical History  Procedure Laterality Date  . Coronary artery bypass graft  June 2011    X3  . Right inguinal hernia repair    . Cholecystectomy      3-4 years ago  . Appendectomy      age 36  . Cataracts    . Colonoscopy  05/12/2002    Dr. [name]- diverticulosis, anal papilla, internal hemorrhoids, rectal polyps  . [name] dilation  04/16/2012    Schatzki's ring-status post dilation and disruption/ as described above. Grade 1 esophageal varices. Small hiatal hernia.  [name] dilation  04/16/2012    Procedure: [name] DILATION;  Surgeon: Daneil Dolin, MD;  Location: [facility];  Service: Endoscopy;  Laterality: N/A;  . Colonoscopy  08/10/2012    Procedure: COLONOSCOPY;  Surgeon: Daneil Dolin, MD;  Location: [facility];  Service: Endoscopy;  Laterality: N/A;  9:45 needs 30 mins extra /have Glucagon on hand  . Left heart catheterization with coronary/graft angiogram N/A 10/29/2013    Procedure: LEFT HEART CATHETERIZATION WITH [name];  Surgeon: Burnell Blanks, MD;  Location: [facility] CATH LAB;  Service: Cardiovascular;  Laterality: N/A;  . Abdominal aortagram N/A 09/09/2014    Procedure: ABDOMINAL Maxcine Ham;  Surgeon: Elam Dutch, MD;  Location: [facility] C[facility] LAB;  Service: Cardiovascular;  Laterality: N/A;  . Atherectomy Bilateral 09/23/2014    Procedure: ATHERECTOMY;  Surgeon: Elam Dutch, MD;  Location: [facility] CATH LAB;  Service: Cardiovascular;  Laterality: Bilateral;  iliacs  . Amputation Left 12/05/2014    Procedure: AMPUTATION ABOVE KNEE;  Surgeon:  Elam Dutch, MD;  Location: [place];  Service: Vascular;  Laterality: Left;    Past Medical History  Diagnosis Date  . Diabetes mellitus   . Hypertension   . Pancreatitis   . Neuropathy   . Hyperlipemia   . Peripheral vascular disease     a. 10/2013: severely abnormal ABIs, for OP evaluation.  [name] GERD (gastroesophageal reflux disease)   . S/P colonoscopy July 2003    hyperplastic polyp, diverticulosis, anal papilla and internal hemorrhoids r  . S/P endoscopy 2003    Dr. Braulio Bosch: erosive reflux esophagitis ,  PUD  . PUD (peptic ulcer disease)     diagnosed via EGD by Dr. Braulio Bosch at [place]  . Diverticulosis   . Hemorrhoids   . Coronary artery disease      a. BMS to LAD 1999. b. HSRA to LAD and stent to RCA 2007. c. NSTEMI/CABG x 3 in 04/2010. d. NSTEMI 10/2013 - secondary to demand ischemia in setting of COPD exacerbation with severe underlying CAD - cath with 3/3 patent grafts, for medical therapy.   [name] COPD (chronic obstructive pulmonary disease)     a. Ongoing tobacco abuse.  . Arthritis   . Neutropenia     a. Dx 10/2013 on labs - instructed to f/u PCP.  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   . Ischemic cardiomyopathy     a. previously EF 30% in 2011. b. 10/2013: EF 45-50% by echo.  . Alcoholism     a. Remote alcoholism  . 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    ROS: [x]  Positive   [ ]  Negative   [ ]  All sytems reviewed and are negative  Cardiovascular: []  chest pain/pressure []  palpitations []  SOB lying flat []  DOE [x]  pain in legs while walking [x]  pain in feet when lying flat []  hx of DVT []  hx of phlebitis [x]  swelling in legs []  varicose veins  Pulmonary: []  productive cough []  asthma []  wheezing  Neurologic: [x]  weakness in []  arms [x]  legs-bilaterally []  numbness in []  arms []  legs [] difficulty speaking or slurred  speech []  temporary loss of vision in one eye []  dizziness  Hematologic: []  bleeding problems []  problems with blood clotting easily  GI []  vomiting blood []  blood in stool  GU: []  burning with urination []  blood in urine  Psychiatric: []  hx of major depression  Integumentary: []  rashes [x]  ulcers-left leg  Constitutional: []  fever []  chills   PHYSICAL EXAMINATION:       Filed Vitals:   03/09/15 1002 03/09/15 1009  BP: 174/62 170/59  Pulse: 75   Temp: 97.6 F (36.4 C)   TempSrc: Oral   Height: 5\' 10"  (1.778 m)   Weight: 150 lb (68.04 kg)   SpO2: 98%     Cardiac: RRR Skin: without rashes,  2 cm full-thickness ulceration plantar aspect right first toe with some dark eschar, 1.5 cm ulceration over the plantar aspect right first metatarsal head with some granulation at the base diffuse edema right lower extremity  Vascular Exam/Pulses: 2+ femoral pulses bilaterally  absent popliteal and pedal pulses right leg Extremities:  left above-knee amputation healing well    Non-Invasive Vascular Imaging:   ABI's 11/03/14: Right 0.55  Pt meds includes: Statin:  Yes.   Beta Blocker:  Yes.   Aspirin: Yes.  ACEI: Yes.  ARB: No Other Antiplatelet/Anticoagulant:  Yes.   Plavix   ASSESSMENT/PLAN Patient has cardiomyopathy, severe deconditioning, severe COPD. I have had several discussions with the patient and his sons and do not believe that he is in any condition to tolerate a femoropopliteal bypass currently. He has a patent aortoiliac system at this point. He has easily palpable bilateral femoral pulses.    Due to his underlying multiple comorbidities and do not believe he will ever be a candidate for a femoropopliteal bypass. The occlusions in his lower extremities are too lengthy and calcified to consider percutaneous options. The patient will  follow-up with me in 6 weeks to recheck the wounds on his right foot. If the wounds continue to deteriorate his only option  would be a right above-knee amputation.   Ruta Hinds, MD [facility] Office: [phone] Pager: [phone]

## 2015-03-31 ENCOUNTER — Encounter: Payer: Self-pay | Admitting: Vascular Surgery

## 2015-04-04 ENCOUNTER — Encounter: Payer: Self-pay | Admitting: Vascular Surgery

## 2015-04-06 ENCOUNTER — Ambulatory Visit (INDEPENDENT_AMBULATORY_CARE_PROVIDER_SITE_OTHER): Payer: Medicare Other | Admitting: Vascular Surgery

## 2015-04-06 ENCOUNTER — Encounter: Payer: Self-pay | Admitting: Vascular Surgery

## 2015-04-06 VITALS — BP 136/64 | HR 70 | Resp 18 | Ht 70.0 in | Wt 151.0 lb

## 2015-04-06 DIAGNOSIS — I739 Peripheral vascular disease, unspecified: Secondary | ICD-10-CM

## 2015-04-06 NOTE — Progress Notes (Signed)
Brief History and Physical  History of Present Illness  Stephen [name] is a 74 y.o. male who presents with chief complaint: Right foot ulcer and cellulitis.  The patient presents today for discussion of treatment option.  He has been on oral antibiotics and daily dry dressing to his right foot.  He states he has daily pain and significant edema on and off.  He is s/p Aortogram with right EIA diamondback athrectomy, right EIA stent, left CIA and EIA athrectomy and left CIA and EIA stent 09/23/14. He states the wounds on the right leg have healed.   He is on a statin for his hypercholesterolemia. He is on a beta blocker and ACEI for his hypertension. He is also diabetic and takes insulin as well as oral agents for this.   Past Medical History  Diagnosis Date  . Diabetes mellitus   . Hypertension   . Pancreatitis   . Neuropathy   . Hyperlipemia   . Peripheral vascular disease     a. 10/2013: severely abnormal ABIs, for OP evaluation.  [name] GERD (gastroesophageal reflux disease)   . S/P colonoscopy July 2003    hyperplastic polyp, diverticulosis, anal papilla and internal hemorrhoids r  . S/P endoscopy 2003    Dr. Braulio Bosch: erosive reflux esophagitis , PUD  . PUD (peptic ulcer disease)     diagnosed via EGD by Dr. Braulio Bosch at [place]  . Diverticulosis   . Hemorrhoids   . Coronary artery disease      a. BMS to LAD 1999. b. HSRA to LAD and stent to RCA 2007. c. NSTEMI/CABG x 3 in 04/2010. d. NSTEMI 10/2013 - secondary to demand ischemia in setting of COPD exacerbation with severe underlying CAD - cath with 3/3 patent grafts, for medical therapy.   [name] COPD (chronic obstructive pulmonary disease)     a. Ongoing tobacco abuse.  . Arthritis   . Neutropenia     a. Dx 10/2013 on labs - instructed to f/u PCP.  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   . Ischemic cardiomyopathy     a. previously EF 30%  in 2011. b. 10/2013: EF 45-50% by echo.  . Alcoholism     a. Remote alcoholism  . 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  [name] Ulcer of right foot   . Chronic kidney disease   . Sinusitis     Past Surgical History  Procedure Laterality Date  . Coronary artery bypass graft  June 2011    X3  . Right inguinal hernia repair    . Cholecystectomy      3-4 years ago  . Appendectomy      age 78  . Cataracts    . Colonoscopy  05/12/2002    Dr. [name]- diverticulosis, anal papilla, internal hemorrhoids, rectal polyps  . [name] dilation  04/16/2012    Schatzki's ring-status post dilation and disruption/ as described above. Grade 1 esophageal varices. Small hiatal hernia.  [name] dilation  04/16/2012    Procedure: [name] DILATION;  Surgeon: Daneil Dolin, MD;  [facility];  Service: Endoscopy;  Laterality: N/A;  . Colonoscopy  08/10/2012    Procedure: COLONOSCOPY;  Surgeon: Daneil Dolin, MD;  [facility];  Service: Endoscopy;  Laterality: N/A;  9:45 needs 30 mins extra /have Glucagon on hand  . Left heart  catheterization with coronary/graft angiogram N/A 10/29/2013    Procedure: LEFT HEART CATHETERIZATION WITH [name];  Surgeon: Burnell Blanks, MD;  Location: [facility] CATH LAB;  Service: Cardiovascular;  Laterality: N/A;  . Abdominal aortagram N/A 09/09/2014    Procedure: ABDOMINAL Maxcine Ham;  Surgeon: Elam Dutch, MD;  Location: [facility] CATH LAB;  Service: Cardiovascular;  Laterality: N/A;  . Atherectomy Bilateral 09/23/2014    Procedure: ATHERECTOMY;  Surgeon: Elam Dutch, MD;  Location: [facility] CATH LAB;  Service: Cardiovascular;  Laterality: Bilateral;  iliacs  . Amputation Left 12/05/2014    Procedure: AMPUTATION ABOVE KNEE;  Surgeon: Elam Dutch, MD;  Location: [place];  Service: Vascular;  Laterality: Left;  [name] Tympanoplasty      History   Social History  . Marital Status: Married    Spouse Name: N/A    . Number of Children: N/A  . Years of Education: N/A   Occupational History  . Disabled    Social History Main Topics  . Smoking status: Current Every Day Smoker -- 0.75 packs/day for 63 years    Types: Cigarettes  . Smokeless tobacco: Never Used  . Alcohol Use: No     Comment: Quit 1985  . Drug Use: No  . Sexual Activity: Not Currently   Other Topics Concern  . Not on file   Social History Narrative    Family History  Problem Relation Age of Onset  . Coronary artery disease Mother   . Coronary artery disease Father   . Diabetes Sister   . Suicidality Brother   . Cirrhosis Brother   . Colon cancer Neg Hx     Current Outpatient Prescriptions on File Prior to Visit  Medication Sig Dispense Refill  . albuterol-ipratropium (COMBIVENT) 18-103 MCG/ACT inhaler Inhale 1-2 puffs into the lungs every 6 (six) hours as needed for wheezing or shortness of breath.    . Amino Acids-Protein Hydrolys (FEEDING SUPPLEMENT, PRO-STAT SUGAR FREE 64,) LIQD Take 30 mLs by mouth 3 (three) times daily with meals. 900 mL 0  . aspirin EC 81 MG tablet Take 1 tablet (81 mg total) by mouth daily. 30 tablet 1  . atorvastatin (LIPITOR) 40 MG tablet Take 40 mg by mouth daily.    . cephALEXin (KEFLEX) 500 MG capsule Take 1 capsule (500 mg total) by mouth every 12 (twelve) hours. 10 capsule 0  . citalopram (CELEXA) 20 MG tablet Take 20 mg by mouth daily.    . clopidogrel (PLAVIX) 75 MG tablet Take 75 mg by mouth daily.    [name] doxycycline (VIBRA-TABS) 100 MG tablet Take 1 tablet (100 mg total) by mouth every 12 (twelve) hours. 10 tablet 0  . fenofibrate 160 MG tablet Take 160 mg by mouth daily with breakfast.     . furosemide (LASIX) 20 MG tablet Take 40 mg by mouth daily.    [name] gabapentin (NEURONTIN) 100 MG capsule Take 300 mg by mouth at bedtime.     [name] glipiZIDE (GLUCOTROL XL) 2.5 MG 24 hr tablet Take 2.5 mg by mouth 2 (two) times daily.     [name] HYDROcodone-acetaminophen (NORCO/VICODIN) 5-325 MG per tablet  Take 1 tablet by mouth 4 (four) times daily as needed for moderate pain. 30 tablet 0  . insulin aspart (NOVOLOG) 100 UNIT/ML injection Inject 0-15 Units into the skin 3 (three) times daily with meals. 10 mL 11  . insulin glargine (LANTUS) 100 UNIT/ML injection Inject 0.15 mLs (15 Units total) into the skin daily. (Patient taking differently: Inject  10-30 Units into the skin daily as needed (for elevated blood sugar levels). ) 10 mL 11  . isosorbide mononitrate (IMDUR) 30 MG 24 hr tablet Take 1 tablet (30 mg total) by mouth daily. 30 tablet 0  . levalbuterol (XOPENEX) 1.25 MG/3ML nebulizer solution Take 0.63 mg by nebulization every 4 (four) hours as needed for wheezing or shortness of breath. 72 mL 0  . lisinopril (PRINIVIL,ZESTRIL) 20 MG tablet Take 20 mg by mouth daily.     [name] loratadine (CLARITIN) 10 MG tablet Take 10 mg by mouth daily.    . metFORMIN (GLUCOPHAGE) 1000 MG tablet Take 1,000 mg by mouth every morning.     . metolazone (ZAROXOLYN) 2.5 MG tablet TAKE ONE (1) TABLET EACH DAY 30 tablet 0  . metoprolol tartrate (LOPRESSOR) 25 MG tablet Take 25 mg by mouth 2 (two) times daily.     . nitroGLYCERIN (NITROSTAT) 0.4 MG SL tablet Place 0.4 mg under the tongue every 5 (five) minutes x 3 doses as needed for chest pain.     . OXYGEN Inhale 3 L into the lungs daily as needed (shortess of breath).    . Pancrelipase, Lip-Prot-Amyl, (CREON) 24000 UNITS CPEP Take 48,000-72,000 Units by mouth See admin instructions. Take 72000 units 3 times daily with all meals and take 48000 units with snacks    . pantoprazole (PROTONIX) 40 MG tablet Take 1 tablet (40 mg total) by mouth daily. 30 tablet 1  . spironolactone (ALDACTONE) 25 MG tablet Take 25 mg by mouth daily.      . SYMBICORT 160-4.5 MCG/ACT inhaler Inhale 1-2 puffs into the lungs 2 (two) times daily as needed (shorntess of breath).     . tamsulosin (FLOMAX) 0.4 MG CAPS capsule Take 0.4 mg by mouth at bedtime.    [name] tiotropium (SPIRIVA) 18 MCG inhalation  capsule Place 18 mcg into inhaler and inhale daily.    . traZODone (DESYREL) 50 MG tablet Take 50 mg by mouth at bedtime.     No current facility-administered medications on file prior to visit.    Allergies  Allergen Reactions  . Lovaza [Omega-3-Acid Ethyl Esters] Itching  . Lyrica [Pregabalin] Hives  . Zithromax [Azithromycin]     rash  . Levaquin [Levofloxacin] Hives    Review of Systems: As listed above, otherwise negative.      ROS: [x]  Positive [ ]  Negative [ ]  All sytems reviewed and are negative  Cardiovascular: []  chest pain/pressure []  palpitations []  SOB lying flat []  DOE [x]  pain in legs while walking [x]  pain in feet when lying flat []  hx of DVT []  hx of phlebitis [x]  swelling in legs []  varicose veins  Pulmonary: []  productive cough []  asthma []  wheezing  Neurologic: [x]  weakness in []  arms [x]  legs-bilaterally []  numbness in []  arms []  legs [] difficulty speaking or slurred speech []  temporary loss of vision in one eye []  dizziness  Hematologic: []  bleeding problems []  problems with blood clotting easily  GI []  vomiting blood []  blood in stool  GU: []  burning with urination []  blood in urine  Psychiatric: []  hx of major depression  Integumentary: []  rashes [x]  ulcers-left leg  Constitutional: []  fever []  chills         Physical Examination  Filed Vitals:   04/06/15 1351  BP: 136/64  Pulse: 70  Resp: 18  Height: 5\' 10"  (1.778 m)  Weight: 151 lb (68.493 kg)    General: A&O x 3, WDWN  Pulmonary: Sym exp, good air movt,  CTAB, no rales, rhonchi, but positive wheezing and he is now on 3L of O2 at home  Cardiac: RRR, Nl S1, S2, no Murmurs, rubs or gallops  Gastrointestinal: soft, NTND, -G/R, - HSM, - masses, - CVAT B  Extremities:  left above-knee amputation healing well still was some edema   Vascular Exam/Pulses: 2+ femoral pulses bilaterally   Laboratory [place]  is a 74 y.o. male who presents with: Right Great toe dry gangrene ulcer with cellulitis.  At this point his pain is sever and his leg is not healing.  He will be scheduled for R AKA amputation by Dr. [name] on 05/03/2015.  We will try to use a spinal block due to his continued smoking and poor lung function.  He was seen in the office today in conjunction with Dr. Carvel Getting, EMMA [facility] PA-C [facility] [place] Office: [phone]   04/06/2015, 2:47 PM  Patient with dry gangrenous changes right first toe. He is overall very deconditioned. He has severe pulmonary dysfunction. He is not a candidate for any further revascularization procedures. I offered the patient today palliative pain control with narcotics or consideration for right above-knee amputation. He has opted for an amputation at this point. This is scheduled for 05/03/2015. We will attempt to do this with a spinal if possible due to his underlying poor pulmonary function.  Ruta Hinds, MD [facility] [place] Office: [phone] Pager: [phone]

## 2015-04-07 ENCOUNTER — Other Ambulatory Visit: Payer: Self-pay

## 2015-04-13 ENCOUNTER — Ambulatory Visit: Payer: Medicare Other | Admitting: Vascular Surgery

## 2015-04-27 ENCOUNTER — Encounter (HOSPITAL_COMMUNITY)
Admission: RE | Admit: 2015-04-27 | Discharge: 2015-04-27 | Disposition: A | Discharge status: Home / Self Care | Payer: Medicare Other | Source: Ambulatory Visit | Attending: Vascular Surgery | Admitting: Vascular Surgery

## 2015-04-27 ENCOUNTER — Encounter (HOSPITAL_COMMUNITY): Payer: Self-pay

## 2015-04-27 DIAGNOSIS — Z89612 Acquired absence of left leg above knee: Secondary | ICD-10-CM | POA: Insufficient documentation

## 2015-04-27 DIAGNOSIS — Z7982 Long term (current) use of aspirin: Secondary | ICD-10-CM | POA: Diagnosis not present

## 2015-04-27 DIAGNOSIS — I129 Hypertensive chronic kidney disease with stage 1 through stage 4 chronic kidney disease, or unspecified chronic kidney disease: Secondary | ICD-10-CM | POA: Insufficient documentation

## 2015-04-27 DIAGNOSIS — I255 Ischemic cardiomyopathy: Secondary | ICD-10-CM | POA: Diagnosis not present

## 2015-04-27 DIAGNOSIS — E785 Hyperlipidemia, unspecified: Secondary | ICD-10-CM | POA: Insufficient documentation

## 2015-04-27 DIAGNOSIS — Z01818 Encounter for other preprocedural examination: Secondary | ICD-10-CM | POA: Insufficient documentation

## 2015-04-27 DIAGNOSIS — Z79899 Other long term (current) drug therapy: Secondary | ICD-10-CM | POA: Diagnosis not present

## 2015-04-27 DIAGNOSIS — I252 Old myocardial infarction: Secondary | ICD-10-CM | POA: Insufficient documentation

## 2015-04-27 DIAGNOSIS — Z01812 Encounter for preprocedural laboratory examination: Secondary | ICD-10-CM | POA: Insufficient documentation

## 2015-04-27 DIAGNOSIS — Z794 Long term (current) use of insulin: Secondary | ICD-10-CM | POA: Diagnosis not present

## 2015-04-27 DIAGNOSIS — I739 Peripheral vascular disease, unspecified: Secondary | ICD-10-CM | POA: Insufficient documentation

## 2015-04-27 DIAGNOSIS — Z951 Presence of aortocoronary bypass graft: Secondary | ICD-10-CM | POA: Insufficient documentation

## 2015-04-27 DIAGNOSIS — F17210 Nicotine dependence, cigarettes, uncomplicated: Secondary | ICD-10-CM | POA: Diagnosis not present

## 2015-04-27 DIAGNOSIS — I2510 Atherosclerotic heart disease of native coronary artery without angina pectoris: Secondary | ICD-10-CM | POA: Diagnosis not present

## 2015-04-27 DIAGNOSIS — J449 Chronic obstructive pulmonary disease, unspecified: Secondary | ICD-10-CM | POA: Diagnosis not present

## 2015-04-27 DIAGNOSIS — E1122 Type 2 diabetes mellitus with diabetic chronic kidney disease: Secondary | ICD-10-CM | POA: Insufficient documentation

## 2015-04-27 DIAGNOSIS — N189 Chronic kidney disease, unspecified: Secondary | ICD-10-CM | POA: Insufficient documentation

## 2015-04-27 HISTORY — DX: Pneumonia, unspecified organism: J18.9

## 2015-04-27 HISTORY — DX: Personal history of other diseases of the digestive system: Z87.19

## 2015-04-27 LAB — COMPREHENSIVE METABOLIC PANEL
ALBUMIN: 2.1 g/dL — AB (ref 3.5–5.0)
ALT: 8 U/L — ABNORMAL LOW (ref 17–63)
AST: 15 U/L (ref 15–41)
Alkaline Phosphatase: 55 U/L (ref 38–126)
Anion gap: 11 (ref 5–15)
BILIRUBIN TOTAL: 0.4 mg/dL (ref 0.3–1.2)
BUN: 7 mg/dL (ref 6–20)
CHLORIDE: 92 mmol/L — AB (ref 101–111)
CO2: 35 mmol/L — AB (ref 22–32)
Calcium: 9.1 mg/dL (ref 8.9–10.3)
Creatinine, Ser: 0.68 mg/dL (ref 0.61–1.24)
GFR calc Af Amer: >60 mL/min (ref >60)
Glucose, Bld: 342 mg/dL — ABNORMAL HIGH (ref 65–99)
Potassium: 3.1 mmol/L — ABNORMAL LOW (ref 3.5–5.1)
Sodium: 138 mmol/L (ref 135–145)
Total Protein: 5.4 g/dL — ABNORMAL LOW (ref 6.5–8.1)

## 2015-04-27 LAB — CBC
HEMATOCRIT: 31.7 % — AB (ref 39.0–52.0)
HEMOGLOBIN: 10.3 g/dL — AB (ref 13.0–17.0)
MCH: 27.8 pg (ref 26.0–34.0)
MCHC: 32.5 g/dL (ref 30.0–36.0)
MCV: 85.7 fL (ref 78.0–100.0)
Platelets: 257 10*3/uL (ref 150–400)
RBC: 3.7 MIL/uL — ABNORMAL LOW (ref 4.22–5.81)
RDW: 15.1 % (ref 11.5–15.5)
WBC: 6.2 10*3/uL (ref 4.0–10.5)

## 2015-04-27 LAB — SURGICAL PCR SCREEN
MRSA, PCR: NEGATIVE
Staphylococcus aureus: NEGATIVE

## 2015-04-27 LAB — GLUCOSE, CAPILLARY: Glucose-Capillary: 318 mg/dL — ABNORMAL HIGH (ref 65–99)

## 2015-04-27 NOTE — Progress Notes (Signed)
PCP is Particia Nearing, NP in [place] [phone] Pt denies seeing a heart Dr., but notes are in epic from Dr Beau Fanny Pt reports his blood sugars run from a little below 200 to the low 200's. Pt reports having chest pain a few days ago, he took one nitro and the pain was gone. Particia Nearing office called and staff states they will send [place] a medical summary.

## 2015-04-27 NOTE — Progress Notes (Signed)
   04/27/15 0933  OBSTRUCTIVE SLEEP APNEA  Have you ever been diagnosed with sleep apnea through a sleep study? No  Do you snore loudly (loud enough to be heard through closed doors)?  1  Do you often feel tired, fatigued, or sleepy during the daytime? 1  Has anyone observed you stop breathing during your sleep? 0  Do you have, or are you being treated for high blood pressure? 1  BMI more than 35 kg/m2? 0  Age over 72 years old? 1  Neck circumference greater than 40 cm/16 inches? 0  Gender: 1

## 2015-04-27 NOTE — Pre-Procedure Instructions (Addendum)
Stephen [name]  04/27/2015      [facility] - [facility], [place] - [address] [place] [address] Phone: [phone] Fax: [phone]    Your procedure is scheduled on June 29  Report to [place] at 630 A.M.  Call this number if you have problems the morning of surgery:  [phone]   Remember:  Do not eat food or drink liquids after midnight.  Take these medicines the morning of surgery with A SIP OF WATER albuterol inhaler- bring it with you on your day of surgery, Celexa (Citalopram), Hydrocodone (Norco) if needed, Xopenex nebulizer if needed, loratadine (Claritin), Metoprolol (Lopressor), Nitro glycerin if needed, Symbicort inhaler if needed  Stop taking aspirin, Aleve, Ibuprofen, BC's, Goody's, Herbal medication, Fish Oil    Do not wear jewelry, make-up or nail polish.  Do not wear lotions, powders, or perfumes.  You may wear deodorant.  Do not shave 48 hours prior to surgery.  Men may shave face and neck.  Do not bring valuables to the hospital.  [facility] is not responsible for any belongings or valuables.  Contacts, dentures or bridgework may not be worn into surgery.  Leave your suitcase in the car.  After surgery it may be brought to your room.  For patients admitted to the hospital, discharge time will be determined by your treatment team.  Patients discharged the day of surgery will not be allowed to drive home.    Special instructions:  [facility] - Preparing for Surgery  Before surgery, you can play an important role.  Because skin is not sterile, your skin needs to be as free of germs as possible.  You can reduce the number of germs on you skin by washing with CHG (chlorahexidine gluconate) soap before surgery.  CHG is an antiseptic cleaner which kills germs and bonds with the skin to continue killing germs even after washing.  Please DO NOT use if you have an allergy to CHG or antibacterial soaps.  If  your skin becomes reddened/irritated stop using the CHG and inform your nurse when you arrive at Short Stay.  Do not shave (including legs and underarms) for at least 48 hours prior to the first CHG shower.  You may shave your face.  Please follow these instructions carefully:   1.  Shower with CHG Soap the night before surgery and the    morning of Surgery.  2.  If you choose to wash your hair, wash your hair first as usual with your   normal shampoo.  3.  After you shampoo, rinse your hair and body thoroughly to remove the Shampoo.  4.  Use CHG as you would any other liquid soap.  You can apply chg directly   to the skin and wash gently with scrungie or a clean washcloth.  5.  Apply the CHG Soap to your body ONLY FROM THE NECK DOWN.   Do not use on open wounds or open sores.  Avoid contact with your eyes, ears, mouth and genitals (private parts).  Wash genitals (private parts)  with your normal soap.  6.  Wash thoroughly, paying special attention to the area where your surgery   will be performed.  7.  Thoroughly rinse your body with warm water from the neck down.  8.  DO NOT shower/wash with your normal soap after using and rinsing off the CHG Soap.  9.  Pat yourself dry with a  clean towel.            10.  Wear clean pajamas.            11.  Place clean sheets on your bed the night of your first shower and do not  sleep with pets.  Day of Surgery  Do not apply any lotions/deoderants the morning of surgery.  Please wear clean clothes to the hospital/surgery center.     Please read over the following fact sheets that you were given. Pain Booklet, Coughing and Deep Breathing, MRSA Information and Surgical Site Infection Prevention

## 2015-04-28 LAB — HEMOGLOBIN A1C
HEMOGLOBIN A1C: 10.8 % — AB (ref 4.8–5.6)
MEAN PLASMA GLUCOSE: 263 mg/dL

## 2015-04-28 NOTE — Progress Notes (Signed)
Anesthesia Chart Review:  Pt is 74 year old male scheduled for R above knee amputation on 05/03/2015 with Dr. [name].   PMH includes: CAD (a. BMS to LAD 1999. b. HSRA to LAD and stent to RCA 2007. c. NSTEMI/CABG x 3 in 04/2010. d. NSTEMI 10/2013 - secondary to demand ischemia in setting of COPD exacerbation with severe underlying CAD - cath with 3/3 patent grafts, for medical therapy), ischemic cardiomyopathy, HTN, DM,  PVD, CKD, COPD,  hyperlipidemia, pancreatitis, PUD, sternal manubrial dissociation, cleft palate, alcoholism (remote). 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 S/p CABG 2011. Current smoker. BMI 22.   Medications include: combivent, ASA, lipitor, lasix, insulin, levalbuterol, lisinopril, metformin, metolazone, metoprolol, nitroglycerin, symbicort.   Preoperative labs reviewed.  H/H 10.3/31.7. Glucose 342. HgbA1c 10.8. Left message for Colletta [place] at [facility] about DM status.   Chest x-ray 02/10/2015 reviewed. No active cardiopulmonary disease.   EKG 02/10/2015: Sinus rhythm. Ventricular premature complex. Nonspecific IVCD with LAD. Probable anteroseptal infarct, recent  Echo 12/05/2014: - Left ventricle: The cavity size was mildly dilated. Wall thickness was normal. Systolic function was moderately to severely reduced. The estimated ejection fraction was in the range of 30% to 35%. Moderate diffuse hypokinesis with distinct regional wall motion abnormalities. Severe hypokinesis of the inferior myocardium. Due to tachycardia, there was fusion of early and atrial contributions to ventricular filling. The study is not technically sufficient to allow evaluation of LV diastolic function. - Mitral valve: There was mild to moderate regurgitation directed centrally. - Left atrium: The atrium was mildly dilated.  Cardiac cath 10/29/2013: 1. Severe triple vessel CAD s/p CABG with 3/3 patent bypass grafts.  2. Moderate LV systolic dysfunction 3. NSTEMI secondary to demand ischemia in setting of COPD exacerbation with severe  underlying CAD  Reviewed case with Dr. Kalman Shan. Pt will need further assessment by assigned anesthesiologist DOS.   Willeen Cass, FNP-BC [facility]/Anesthesiology Phone: [phone] 04/28/2015 4:29 PM

## 2015-05-02 MED ORDER — DEXTROSE 5 % IV SOLN
1.5000 g | INTRAVENOUS | Status: AC
  Administered 2015-05-03: 1.5 g via INTRAVENOUS
  Filled 2015-05-02: qty 1.5

## 2015-05-03 ENCOUNTER — Inpatient Hospital Stay (HOSPITAL_COMMUNITY)
Admission: RE | Admit: 2015-05-03 | Discharge: 2015-05-06 | DRG: 240 | Disposition: A | Discharge status: Skilled Nursing Facility | Payer: Medicare Other | Source: Ambulatory Visit | Attending: Vascular Surgery | Admitting: Vascular Surgery

## 2015-05-03 ENCOUNTER — Inpatient Hospital Stay (HOSPITAL_COMMUNITY): Payer: Medicare Other | Admitting: Emergency Medicine

## 2015-05-03 ENCOUNTER — Encounter (HOSPITAL_COMMUNITY)
Admission: RE | Disposition: A | Discharge status: Skilled Nursing Facility | Payer: Self-pay | Source: Ambulatory Visit | Attending: Vascular Surgery

## 2015-05-03 ENCOUNTER — Encounter (HOSPITAL_COMMUNITY): Payer: Self-pay | Admitting: General Practice

## 2015-05-03 DIAGNOSIS — I255 Ischemic cardiomyopathy: Secondary | ICD-10-CM | POA: Diagnosis present

## 2015-05-03 DIAGNOSIS — E11649 Type 2 diabetes mellitus with hypoglycemia without coma: Secondary | ICD-10-CM | POA: Diagnosis present

## 2015-05-03 DIAGNOSIS — T402X5A Adverse effect of other opioids, initial encounter: Secondary | ICD-10-CM | POA: Diagnosis not present

## 2015-05-03 DIAGNOSIS — E780 Pure hypercholesterolemia: Secondary | ICD-10-CM | POA: Diagnosis present

## 2015-05-03 DIAGNOSIS — Z9981 Dependence on supplemental oxygen: Secondary | ICD-10-CM

## 2015-05-03 DIAGNOSIS — E876 Hypokalemia: Secondary | ICD-10-CM | POA: Diagnosis present

## 2015-05-03 DIAGNOSIS — R410 Disorientation, unspecified: Secondary | ICD-10-CM | POA: Diagnosis not present

## 2015-05-03 DIAGNOSIS — I2510 Atherosclerotic heart disease of native coronary artery without angina pectoris: Secondary | ICD-10-CM | POA: Diagnosis present

## 2015-05-03 DIAGNOSIS — Z888 Allergy status to other drugs, medicaments and biological substances status: Secondary | ICD-10-CM | POA: Diagnosis not present

## 2015-05-03 DIAGNOSIS — I96 Gangrene, not elsewhere classified: Secondary | ICD-10-CM | POA: Diagnosis present

## 2015-05-03 DIAGNOSIS — J449 Chronic obstructive pulmonary disease, unspecified: Secondary | ICD-10-CM | POA: Diagnosis present

## 2015-05-03 DIAGNOSIS — Z794 Long term (current) use of insulin: Secondary | ICD-10-CM

## 2015-05-03 DIAGNOSIS — L97519 Non-pressure chronic ulcer of other part of right foot with unspecified severity: Secondary | ICD-10-CM | POA: Diagnosis present

## 2015-05-03 DIAGNOSIS — Z881 Allergy status to other antibiotic agents status: Secondary | ICD-10-CM

## 2015-05-03 DIAGNOSIS — F17210 Nicotine dependence, cigarettes, uncomplicated: Secondary | ICD-10-CM | POA: Diagnosis present

## 2015-05-03 DIAGNOSIS — Z955 Presence of coronary angioplasty implant and graft: Secondary | ICD-10-CM | POA: Diagnosis not present

## 2015-05-03 DIAGNOSIS — Z7982 Long term (current) use of aspirin: Secondary | ICD-10-CM | POA: Diagnosis not present

## 2015-05-03 DIAGNOSIS — Z8711 Personal history of peptic ulcer disease: Secondary | ICD-10-CM | POA: Diagnosis not present

## 2015-05-03 DIAGNOSIS — Z951 Presence of aortocoronary bypass graft: Secondary | ICD-10-CM | POA: Diagnosis not present

## 2015-05-03 DIAGNOSIS — Z7902 Long term (current) use of antithrombotics/antiplatelets: Secondary | ICD-10-CM | POA: Diagnosis not present

## 2015-05-03 DIAGNOSIS — E785 Hyperlipidemia, unspecified: Secondary | ICD-10-CM | POA: Diagnosis present

## 2015-05-03 DIAGNOSIS — E440 Moderate protein-calorie malnutrition: Secondary | ICD-10-CM | POA: Diagnosis present

## 2015-05-03 DIAGNOSIS — K219 Gastro-esophageal reflux disease without esophagitis: Secondary | ICD-10-CM | POA: Diagnosis present

## 2015-05-03 DIAGNOSIS — Z79899 Other long term (current) drug therapy: Secondary | ICD-10-CM | POA: Diagnosis not present

## 2015-05-03 DIAGNOSIS — I252 Old myocardial infarction: Secondary | ICD-10-CM | POA: Diagnosis not present

## 2015-05-03 DIAGNOSIS — L03031 Cellulitis of right toe: Secondary | ICD-10-CM | POA: Diagnosis present

## 2015-05-03 DIAGNOSIS — Z89612 Acquired absence of left leg above knee: Secondary | ICD-10-CM | POA: Diagnosis not present

## 2015-05-03 HISTORY — PX: AMPUTATION: SHX166

## 2015-05-03 HISTORY — DX: Type 2 diabetes mellitus without complications: E11.9

## 2015-05-03 HISTORY — PX: ABOVE KNEE LEG AMPUTATION: SUR20

## 2015-05-03 LAB — CBC
HEMATOCRIT: 26.7 % — AB (ref 39.0–52.0)
HEMOGLOBIN: 8.7 g/dL — AB (ref 13.0–17.0)
MCH: 27.9 pg (ref 26.0–34.0)
MCHC: 32.6 g/dL (ref 30.0–36.0)
MCV: 85.6 fL (ref 78.0–100.0)
Platelets: 261 10*3/uL (ref 150–400)
RBC: 3.12 MIL/uL — ABNORMAL LOW (ref 4.22–5.81)
RDW: 15.4 % (ref 11.5–15.5)
WBC: 10.1 10*3/uL (ref 4.0–10.5)

## 2015-05-03 LAB — GLUCOSE, CAPILLARY
Glucose-Capillary: 387 mg/dL — ABNORMAL HIGH (ref 65–99)
Glucose-Capillary: 335 mg/dL — ABNORMAL HIGH (ref 65–99)
Glucose-Capillary: 187 mg/dL — ABNORMAL HIGH (ref 65–99)
Glucose-Capillary: 126 mg/dL — ABNORMAL HIGH (ref 65–99)
Glucose-Capillary: 218 mg/dL — ABNORMAL HIGH (ref 65–99)

## 2015-05-03 LAB — CREATININE, SERUM
Creatinine, Ser: 0.68 mg/dL (ref 0.61–1.24)
GFR calc non Af Amer: >60 mL/min (ref >60)

## 2015-05-03 SURGERY — AMPUTATION, ABOVE KNEE
Anesthesia: Monitor Anesthesia Care | Site: Leg Upper | Laterality: Right

## 2015-05-03 MED ORDER — SODIUM CHLORIDE 0.9 % IV SOLN
INTRAVENOUS | Status: DC
  Administered 2015-05-03: 50 mL/h via INTRAVENOUS
  Administered 2015-05-04: 09:00:00 via INTRAVENOUS

## 2015-05-03 MED ORDER — INSULIN ASPART 100 UNIT/ML ~~LOC~~ SOLN
SUBCUTANEOUS | Status: AC
  Administered 2015-05-03: 1 [IU] via SUBCUTANEOUS
  Filled 2015-05-03: qty 1

## 2015-05-03 MED ORDER — FENOFIBRATE 160 MG PO TABS
160.0000 mg | ORAL_TABLET | Freq: Every day | ORAL | Status: DC
  Administered 2015-05-04 – 2015-05-06 (×3): 160 mg via ORAL
  Filled 2015-05-03 (×4): qty 1

## 2015-05-03 MED ORDER — ALBUTEROL SULFATE (2.5 MG/3ML) 0.083% IN NEBU
INHALATION_SOLUTION | RESPIRATORY_TRACT | Status: AC
  Filled 2015-05-03: qty 3

## 2015-05-03 MED ORDER — CHLORHEXIDINE GLUCONATE CLOTH 2 % EX PADS: 6.0000 | MEDICATED_PAD | Freq: Once | CUTANEOUS | Status: DC

## 2015-05-03 MED ORDER — PROMETHAZINE HCL 25 MG/ML IJ SOLN: 6.2500 mg | INTRAMUSCULAR | Status: DC | PRN

## 2015-05-03 MED ORDER — PANCRELIPASE (LIP-PROT-AMYL) 12000-38000 UNITS PO CPEP
72000.0000 [IU] | ORAL_CAPSULE | Freq: Three times a day (TID) | ORAL | Status: DC
  Administered 2015-05-03 – 2015-05-06 (×8): 72000 [IU] via ORAL
  Filled 2015-05-03 (×11): qty 6

## 2015-05-03 MED ORDER — PANTOPRAZOLE SODIUM 40 MG PO TBEC
40.0000 mg | DELAYED_RELEASE_TABLET | Freq: Every day | ORAL | Status: DC
  Administered 2015-05-03 – 2015-05-05 (×3): 40 mg via ORAL
  Filled 2015-05-03 (×3): qty 1

## 2015-05-03 MED ORDER — BUPIVACAINE IN DEXTROSE 0.75-8.25 % IT SOLN
INTRATHECAL | Status: DC | PRN
  Administered 2015-05-03: 12 mg via INTRATHECAL

## 2015-05-03 MED ORDER — HYDRALAZINE HCL 20 MG/ML IJ SOLN: 5.0000 mg | INTRAMUSCULAR | Status: DC | PRN

## 2015-05-03 MED ORDER — INSULIN ASPART 100 UNIT/ML ~~LOC~~ SOLN
0.0000 [IU] | Freq: Three times a day (TID) | SUBCUTANEOUS | Status: DC
  Administered 2015-05-03: 7 [IU] via SUBCUTANEOUS
  Administered 2015-05-03: 1 [IU] via SUBCUTANEOUS
  Administered 2015-05-06: 2 [IU] via SUBCUTANEOUS

## 2015-05-03 MED ORDER — DEXTROSE 5 % IV SOLN
1.5000 g | Freq: Two times a day (BID) | INTRAVENOUS | Status: AC
  Administered 2015-05-03 – 2015-05-04 (×2): 1.5 g via INTRAVENOUS
  Filled 2015-05-03 (×2): qty 1.5

## 2015-05-03 MED ORDER — ASPIRIN 325 MG PO TABS
325.0000 mg | ORAL_TABLET | Freq: Every day | ORAL | Status: DC
  Administered 2015-05-03 – 2015-05-06 (×4): 325 mg via ORAL
  Filled 2015-05-03 (×4): qty 1

## 2015-05-03 MED ORDER — ALBUTEROL SULFATE (2.5 MG/3ML) 0.083% IN NEBU
2.5000 mg | INHALATION_SOLUTION | Freq: Four times a day (QID) | RESPIRATORY_TRACT | Status: DC
  Administered 2015-05-03 – 2015-05-06 (×13): 2.5 mg via RESPIRATORY_TRACT
  Filled 2015-05-03 (×23): qty 3

## 2015-05-03 MED ORDER — CITALOPRAM HYDROBROMIDE 20 MG PO TABS
20.0000 mg | ORAL_TABLET | Freq: Every day | ORAL | Status: DC
  Administered 2015-05-04 – 2015-05-06 (×3): 20 mg via ORAL
  Filled 2015-05-03 (×3): qty 1

## 2015-05-03 MED ORDER — DOCUSATE SODIUM 100 MG PO CAPS
100.0000 mg | ORAL_CAPSULE | Freq: Every day | ORAL | Status: DC
  Administered 2015-05-04 – 2015-05-06 (×3): 100 mg via ORAL
  Filled 2015-05-03 (×4): qty 1

## 2015-05-03 MED ORDER — LIDOCAINE HCL (CARDIAC) 20 MG/ML IV SOLN
INTRAVENOUS | Status: DC | PRN
  Administered 2015-05-03: 100 mg via INTRAVENOUS

## 2015-05-03 MED ORDER — TAMSULOSIN HCL 0.4 MG PO CAPS
0.4000 mg | ORAL_CAPSULE | Freq: Every day | ORAL | Status: DC
  Administered 2015-05-03 – 2015-05-05 (×3): 0.4 mg via ORAL
  Filled 2015-05-03 (×4): qty 1

## 2015-05-03 MED ORDER — MAGNESIUM SULFATE 2 GM/50ML IV SOLN
2.0000 g | Freq: Every day | INTRAVENOUS | Status: DC | PRN
  Filled 2015-05-03: qty 50

## 2015-05-03 MED ORDER — PROPOFOL INFUSION 10 MG/ML OPTIME
INTRAVENOUS | Status: DC | PRN
  Administered 2015-05-03: 25 ug/kg/min via INTRAVENOUS

## 2015-05-03 MED ORDER — MIDAZOLAM HCL 2 MG/2ML IJ SOLN
INTRAMUSCULAR | Status: AC
  Filled 2015-05-03: qty 2

## 2015-05-03 MED ORDER — ATORVASTATIN CALCIUM 40 MG PO TABS
40.0000 mg | ORAL_TABLET | Freq: Every day | ORAL | Status: DC
  Administered 2015-05-03 – 2015-05-05 (×3): 40 mg via ORAL
  Filled 2015-05-03 (×4): qty 1

## 2015-05-03 MED ORDER — DEXTROSE 5 % IV SOLN
10.0000 mg | INTRAVENOUS | Status: DC | PRN
  Administered 2015-05-03: 10 ug/min via INTRAVENOUS

## 2015-05-03 MED ORDER — FUROSEMIDE 40 MG PO TABS
40.0000 mg | ORAL_TABLET | Freq: Every day | ORAL | Status: DC
  Administered 2015-05-03 – 2015-05-06 (×4): 40 mg via ORAL
  Filled 2015-05-03 (×4): qty 1

## 2015-05-03 MED ORDER — 0.9 % SODIUM CHLORIDE (POUR BTL) OPTIME
TOPICAL | Status: DC | PRN
Start: 2015-05-03 — End: 2015-05-03
  Administered 2015-05-03: 1000 mL

## 2015-05-03 MED ORDER — FENTANYL CITRATE (PF) 250 MCG/5ML IJ SOLN
INTRAMUSCULAR | Status: AC
  Filled 2015-05-03: qty 5

## 2015-05-03 MED ORDER — ENOXAPARIN SODIUM 40 MG/0.4ML ~~LOC~~ SOLN
40.0000 mg | SUBCUTANEOUS | Status: DC
  Administered 2015-05-04 – 2015-05-06 (×3): 40 mg via SUBCUTANEOUS
  Filled 2015-05-03 (×4): qty 0.4

## 2015-05-03 MED ORDER — METOPROLOL TARTRATE 25 MG PO TABS
25.0000 mg | ORAL_TABLET | Freq: Two times a day (BID) | ORAL | Status: DC
  Administered 2015-05-03 – 2015-05-06 (×6): 25 mg via ORAL
  Filled 2015-05-03 (×7): qty 1

## 2015-05-03 MED ORDER — ONDANSETRON HCL 4 MG/2ML IJ SOLN
INTRAMUSCULAR | Status: AC
  Filled 2015-05-03: qty 2

## 2015-05-03 MED ORDER — PHENOL 1.4 % MT LIQD: 1.0000 | OROMUCOSAL | Status: DC | PRN

## 2015-05-03 MED ORDER — OXYCODONE HCL 5 MG PO TABS
5.0000 mg | ORAL_TABLET | ORAL | Status: DC | PRN
  Administered 2015-05-03 – 2015-05-04 (×2): 10 mg via ORAL
  Filled 2015-05-03 (×2): qty 2

## 2015-05-03 MED ORDER — GUAIFENESIN-DM 100-10 MG/5ML PO SYRP
15.0000 mL | ORAL_SOLUTION | ORAL | Status: DC | PRN
  Administered 2015-05-06: 15 mL via ORAL
  Filled 2015-05-03 (×2): qty 15

## 2015-05-03 MED ORDER — METFORMIN HCL 500 MG PO TABS
1000.0000 mg | ORAL_TABLET | Freq: Two times a day (BID) | ORAL | Status: DC
  Administered 2015-05-03 – 2015-05-05 (×4): 1000 mg via ORAL
  Filled 2015-05-03 (×8): qty 2

## 2015-05-03 MED ORDER — PROPOFOL 10 MG/ML IV BOLUS
INTRAVENOUS | Status: AC
  Filled 2015-05-03: qty 20

## 2015-05-03 MED ORDER — BUDESONIDE-FORMOTEROL FUMARATE 160-4.5 MCG/ACT IN AERO: 1.0000 | INHALATION_SPRAY | Freq: Two times a day (BID) | RESPIRATORY_TRACT | Status: DC | PRN

## 2015-05-03 MED ORDER — LORATADINE 10 MG PO TABS
10.0000 mg | ORAL_TABLET | Freq: Every day | ORAL | Status: DC
  Administered 2015-05-04 – 2015-05-06 (×3): 10 mg via ORAL
  Filled 2015-05-03 (×3): qty 1

## 2015-05-03 MED ORDER — TRAZODONE HCL 50 MG PO TABS
50.0000 mg | ORAL_TABLET | Freq: Every evening | ORAL | Status: DC | PRN
  Administered 2015-05-04 – 2015-05-05 (×2): 50 mg via ORAL
  Filled 2015-05-03 (×4): qty 1

## 2015-05-03 MED ORDER — POTASSIUM CHLORIDE CRYS ER 20 MEQ PO TBCR
20.0000 meq | EXTENDED_RELEASE_TABLET | Freq: Every day | ORAL | Status: AC | PRN
  Administered 2015-05-04: 40 meq via ORAL
  Filled 2015-05-03: qty 2

## 2015-05-03 MED ORDER — ROCURONIUM BROMIDE 50 MG/5ML IV SOLN
INTRAVENOUS | Status: AC
  Filled 2015-05-03: qty 1

## 2015-05-03 MED ORDER — ALBUTEROL SULFATE (2.5 MG/3ML) 0.083% IN NEBU
2.5000 mg | INHALATION_SOLUTION | Freq: Once | RESPIRATORY_TRACT | Status: AC
  Administered 2015-05-03: 2.5 mg via RESPIRATORY_TRACT
  Filled 2015-05-03: qty 3

## 2015-05-03 MED ORDER — METOPROLOL TARTRATE 1 MG/ML IV SOLN: 2.0000 mg | INTRAVENOUS | Status: DC | PRN

## 2015-05-03 MED ORDER — NITROGLYCERIN 0.4 MG SL SUBL: 0.4000 mg | SUBLINGUAL_TABLET | SUBLINGUAL | Status: DC | PRN

## 2015-05-03 MED ORDER — HYDROMORPHONE HCL 1 MG/ML IJ SOLN
INTRAMUSCULAR | Status: AC
  Filled 2015-05-03: qty 1

## 2015-05-03 MED ORDER — HYDROCODONE-ACETAMINOPHEN 5-325 MG PO TABS
1.0000 | ORAL_TABLET | Freq: Four times a day (QID) | ORAL | Status: DC | PRN
  Administered 2015-05-03 – 2015-05-06 (×5): 1 via ORAL
  Filled 2015-05-03 (×6): qty 1

## 2015-05-03 MED ORDER — LISINOPRIL 20 MG PO TABS
20.0000 mg | ORAL_TABLET | Freq: Every day | ORAL | Status: DC
  Administered 2015-05-03 – 2015-05-06 (×4): 20 mg via ORAL
  Filled 2015-05-03 (×4): qty 1

## 2015-05-03 MED ORDER — ONDANSETRON HCL 4 MG/2ML IJ SOLN: 4.0000 mg | Freq: Four times a day (QID) | INTRAMUSCULAR | Status: DC | PRN

## 2015-05-03 MED ORDER — MIDAZOLAM HCL 5 MG/5ML IJ SOLN
INTRAMUSCULAR | Status: DC | PRN
  Administered 2015-05-03: 0.5 mg via INTRAVENOUS

## 2015-05-03 MED ORDER — IPRATROPIUM-ALBUTEROL 18-103 MCG/ACT IN AERO: 1.0000 | INHALATION_SPRAY | Freq: Four times a day (QID) | RESPIRATORY_TRACT | Status: DC | PRN

## 2015-05-03 MED ORDER — ACETAMINOPHEN 650 MG RE SUPP: 325.0000 mg | RECTAL | Status: DC | PRN

## 2015-05-03 MED ORDER — LACTATED RINGERS IV SOLN
INTRAVENOUS | Status: DC | PRN
  Administered 2015-05-03: 08:00:00 via INTRAVENOUS

## 2015-05-03 MED ORDER — ARTIFICIAL TEARS OP OINT
TOPICAL_OINTMENT | OPHTHALMIC | Status: AC
  Filled 2015-05-03: qty 3.5

## 2015-05-03 MED ORDER — INSULIN GLARGINE 100 UNIT/ML ~~LOC~~ SOLN
15.0000 [IU] | Freq: Every day | SUBCUTANEOUS | Status: DC
  Administered 2015-05-03 – 2015-05-04 (×2): 15 [IU] via SUBCUTANEOUS
  Filled 2015-05-03 (×4): qty 0.15

## 2015-05-03 MED ORDER — IPRATROPIUM-ALBUTEROL 0.5-2.5 (3) MG/3ML IN SOLN
3.0000 mL | Freq: Four times a day (QID) | RESPIRATORY_TRACT | Status: DC | PRN
  Administered 2015-05-05: 3 mL via RESPIRATORY_TRACT
  Filled 2015-05-03 (×2): qty 3

## 2015-05-03 MED ORDER — HYDROMORPHONE HCL 1 MG/ML IJ SOLN
0.2500 mg | INTRAMUSCULAR | Status: DC | PRN
  Administered 2015-05-03 (×2): 0.5 mg via INTRAVENOUS

## 2015-05-03 MED ORDER — PANCRELIPASE (LIP-PROT-AMYL) 12000-38000 UNITS PO CPEP
48000.0000 [IU] | ORAL_CAPSULE | ORAL | Status: DC
  Administered 2015-05-03 – 2015-05-06 (×4): 48000 [IU] via ORAL
  Filled 2015-05-03 (×10): qty 4

## 2015-05-03 MED ORDER — MORPHINE SULFATE 2 MG/ML IJ SOLN
2.0000 mg | INTRAMUSCULAR | Status: DC | PRN
  Administered 2015-05-03 – 2015-05-05 (×8): 2 mg via INTRAVENOUS
  Filled 2015-05-03 (×8): qty 1

## 2015-05-03 MED ORDER — ALUM & MAG HYDROXIDE-SIMETH 200-200-20 MG/5ML PO SUSP: 15.0000 mL | ORAL | Status: DC | PRN

## 2015-05-03 MED ORDER — ACETAMINOPHEN 325 MG PO TABS: 325.0000 mg | ORAL_TABLET | ORAL | Status: DC | PRN

## 2015-05-03 MED ORDER — SODIUM CHLORIDE 0.9 % IV SOLN: INTRAVENOUS | Status: DC

## 2015-05-03 MED ORDER — LABETALOL HCL 5 MG/ML IV SOLN
10.0000 mg | INTRAVENOUS | Status: DC | PRN
  Filled 2015-05-03: qty 4

## 2015-05-03 SURGICAL SUPPLY — 43 items
BANDAGE ELASTIC 6 VELCRO ST LF (GAUZE/BANDAGES/DRESSINGS) ×2 IMPLANT
BLADE SAW RECIP 87.9 MT (BLADE) ×2 IMPLANT
BNDG COHESIVE 4X5 TAN STRL (GAUZE/BANDAGES/DRESSINGS) ×2 IMPLANT
BNDG COHESIVE 6X5 TAN STRL LF (GAUZE/BANDAGES/DRESSINGS) ×2 IMPLANT
BNDG GAUZE ELAST 4 BULKY (GAUZE/BANDAGES/DRESSINGS) ×2 IMPLANT
CANISTER SUCTION 2500CC (MISCELLANEOUS) ×2 IMPLANT
CLIP TI MEDIUM 6 (CLIP) IMPLANT
COVER SURGICAL LIGHT HANDLE (MISCELLANEOUS) ×2 IMPLANT
COVER TABLE BACK 60X90 (DRAPES) ×2 IMPLANT
DRAIN CHANNEL 19F RND (DRAIN) IMPLANT
DRAPE ORTHO SPLIT 77X108 STRL (DRAPES) ×4
DRAPE PROXIMA HALF (DRAPES) ×2 IMPLANT
DRAPE SURG ORHT 6 SPLT 77X108 (DRAPES) ×2 IMPLANT
DRSG ADAPTIC 3X8 NADH LF (GAUZE/BANDAGES/DRESSINGS) ×2 IMPLANT
ELECT REM PT RETURN 9FT ADLT (ELECTROSURGICAL) ×2
ELECTRODE REM PT RTRN 9FT ADLT (ELECTROSURGICAL) ×1 IMPLANT
EVACUATOR SILICONE 100CC (DRAIN) IMPLANT
GAUZE SPONGE 4X4 12PLY STRL (GAUZE/BANDAGES/DRESSINGS) ×1 IMPLANT
GLOVE BIO SURGEON STRL SZ 6.5 (GLOVE) ×2 IMPLANT
GLOVE BIO SURGEON STRL SZ7.5 (GLOVE) ×2 IMPLANT
GLOVE BIOGEL PI IND STRL 6.5 (GLOVE) IMPLANT
GLOVE BIOGEL PI IND STRL 7.0 (GLOVE) IMPLANT
GLOVE BIOGEL PI INDICATOR 6.5 (GLOVE) ×3
GLOVE BIOGEL PI INDICATOR 7.0 (GLOVE) ×1
GOWN STRL REUS W/ TWL LRG LVL3 (GOWN DISPOSABLE) ×3 IMPLANT
GOWN STRL REUS W/TWL LRG LVL3 (GOWN DISPOSABLE) ×6
KIT BASIN OR (CUSTOM PROCEDURE TRAY) ×2 IMPLANT
KIT ROOM TURNOVER OR (KITS) ×2 IMPLANT
NS IRRIG 1000ML POUR BTL (IV SOLUTION) ×2 IMPLANT
PACK GENERAL/GYN (CUSTOM PROCEDURE TRAY) ×2 IMPLANT
PAD ARMBOARD 7.5X6 YLW CONV (MISCELLANEOUS) ×4 IMPLANT
SPONGE GAUZE 4X4 12PLY STER LF (GAUZE/BANDAGES/DRESSINGS) ×1 IMPLANT
STAPLER VISISTAT 35W (STAPLE) ×2 IMPLANT
STOCKINETTE IMPERVIOUS LG (DRAPES) ×2 IMPLANT
SUT ETHILON 3 0 PS 1 (SUTURE) IMPLANT
SUT SILK 2 0 SH (SUTURE) ×2 IMPLANT
SUT SILK 2 0 TIES 10X30 (SUTURE) ×2 IMPLANT
SUT VIC AB 2-0 CT1 18 (SUTURE) ×5 IMPLANT
SUT VIC AB 2-0 SH 18 (SUTURE) ×2 IMPLANT
SUT VIC AB 3-0 SH 27 (SUTURE) ×2
SUT VIC AB 3-0 SH 27X BRD (SUTURE) ×2 IMPLANT
UNDERPAD 30X30 INCONTINENT (UNDERPADS AND DIAPERS) ×2 IMPLANT
WATER STERILE IRR 1000ML POUR (IV SOLUTION) ×2 IMPLANT

## 2015-05-03 NOTE — Op Note (Signed)
[facility] OPERATIVE NOTE  Procedure: Right above knee amputation  Surgeon(s): Elam Dutch, MD  ASSISTANT: Silva Bandy, PA-C  Anesthesia: Spinal with sedation  Specimens: Right leg  PROCEDURE DETAIL: After obtaining informed consent, the patient was taken to the operating room. The patient was placed in supine position the operating room table. After induction of regional anesthesia the patient's Foley catheter was placed. Next patient's entire right lower extremity was prepped and draped in usual sterile fashion. A circumferential incision was made on the right leg just above the knee. The incision was carried down into the sucutaneous tissues down to level the saphenous vein. This was ligated and divided between silk ties. Soft tissues were taken down as well as the muscle and fascia with cautery. The superficial femoral artery and vein were dissected free circumferentially clamped and divided. These were suture ligated proximally. Remainder of the soft tissues were taken down with cautery. The periosteum was raised on the femur approximately 5 cm above the skin edge. The femur was divided at this level. The leg was passed off the table as a specimen. Hemostasis was obtained. The wound was thoroughly irrigated with normal saline solution. The fascial edges were reapproximated using interrupted 2 0 Vicryl sutures. The subcutaneous tissues reapproximated using a running 3-0 Vicryl suture. The skin was closed staples. Patient tolerated procedure well and there were no complications. Instrument sponge and needle counts correct in the case. Patient was taken to recovery in stable condition.  Ruta Hinds, MD [facility] [place] [facility] Office: [phone] Pager: [phone]

## 2015-05-03 NOTE — Interval H&P Note (Signed)
History and Physical Interval Note:  05/03/2015 8:19 AM  Stephen [name]  has presented today for surgery, with the diagnosis of Right great toe dry gangrene ulcer I96  The various methods of treatment have been discussed with the patient and family. After consideration of risks, benefits and other options for treatment, the patient has consented to  Procedure(s): AMPUTATION ABOVE KNEE (Right) as a surgical intervention .  The patient's history has been reviewed, patient examined, no change in status, stable for surgery.  I have reviewed the patient's chart and labs.  Questions were answered to the patient's satisfaction.     Ruta Hinds

## 2015-05-03 NOTE — H&P (View-Only) (Signed)
Brief History and Physical  History of Present Illness  Stephen [name] is a 74 y.o. male who presents with chief complaint: Right foot ulcer and cellulitis.  The patient presents today for discussion of treatment option.  He has been on oral antibiotics and daily dry dressing to his right foot.  He states he has daily pain and significant edema on and off.  He is s/p Aortogram with right EIA diamondback athrectomy, right EIA stent, left CIA and EIA athrectomy and left CIA and EIA stent 09/23/14. He states the wounds on the right leg have healed.   He is on a statin for his hypercholesterolemia. He is on a beta blocker and ACEI for his hypertension. He is also diabetic and takes insulin as well as oral agents for this.   Past Medical History  Diagnosis Date  . Diabetes mellitus   . Hypertension   . Pancreatitis   . Neuropathy   . Hyperlipemia   . Peripheral vascular disease     a. 10/2013: severely abnormal ABIs, for OP evaluation.  [name] GERD (gastroesophageal reflux disease)   . S/P colonoscopy July 2003    hyperplastic polyp, diverticulosis, anal papilla and internal hemorrhoids r  . S/P endoscopy 2003    Dr. Braulio Bosch: erosive reflux esophagitis , PUD  . PUD (peptic ulcer disease)     diagnosed via EGD by Dr. Braulio Bosch at [place]  . Diverticulosis   . Hemorrhoids   . Coronary artery disease      a. BMS to LAD 1999. b. HSRA to LAD and stent to RCA 2007. c. NSTEMI/CABG x 3 in 04/2010. d. NSTEMI 10/2013 - secondary to demand ischemia in setting of COPD exacerbation with severe underlying CAD - cath with 3/3 patent grafts, for medical therapy.   [name] COPD (chronic obstructive pulmonary disease)     a. Ongoing tobacco abuse.  . Arthritis   . Neutropenia     a. Dx 10/2013 on labs - instructed to f/u PCP.  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   . Ischemic cardiomyopathy     a. previously EF 30%  in 2011. b. 10/2013: EF 45-50% by echo.  . Alcoholism     a. Remote alcoholism  . 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  [name] Ulcer of right foot   . Chronic kidney disease   . Sinusitis     Past Surgical History  Procedure Laterality Date  . Coronary artery bypass graft  June 2011    X3  . Right inguinal hernia repair    . Cholecystectomy      3-4 years ago  . Appendectomy      age 55  . Cataracts    . Colonoscopy  05/12/2002    Dr. [name]- diverticulosis, anal papilla, internal hemorrhoids, rectal polyps  . [name] dilation  04/16/2012    Schatzki's ring-status post dilation and disruption/ as described above. Grade 1 esophageal varices. Small hiatal hernia.  [name] dilation  04/16/2012    Procedure: [name] DILATION;  Surgeon: Daneil Dolin, MD;  [facility];  Service: Endoscopy;  Laterality: N/A;  . Colonoscopy  08/10/2012    Procedure: COLONOSCOPY;  Surgeon: Daneil Dolin, MD;  [facility];  Service: Endoscopy;  Laterality: N/A;  9:45 needs 30 mins extra /have Glucagon on hand  . Left heart  catheterization with coronary/graft angiogram N/A 10/29/2013    Procedure: LEFT HEART CATHETERIZATION WITH [name];  Surgeon: Burnell Blanks, MD;  Location: [facility] CATH LAB;  Service: Cardiovascular;  Laterality: N/A;  . Abdominal aortagram N/A 09/09/2014    Procedure: ABDOMINAL Maxcine Ham;  Surgeon: Elam Dutch, MD;  Location: [facility] CATH LAB;  Service: Cardiovascular;  Laterality: N/A;  . Atherectomy Bilateral 09/23/2014    Procedure: ATHERECTOMY;  Surgeon: Elam Dutch, MD;  Location: [facility] CATH LAB;  Service: Cardiovascular;  Laterality: Bilateral;  iliacs  . Amputation Left 12/05/2014    Procedure: AMPUTATION ABOVE KNEE;  Surgeon: Elam Dutch, MD;  Location: [place];  Service: Vascular;  Laterality: Left;  [name] Tympanoplasty      History   Social History  . Marital Status: Married    Spouse Name: N/A    . Number of Children: N/A  . Years of Education: N/A   Occupational History  . Disabled    Social History Main Topics  . Smoking status: Current Every Day Smoker -- 0.75 packs/day for 63 years    Types: Cigarettes  . Smokeless tobacco: Never Used  . Alcohol Use: No     Comment: Quit 1985  . Drug Use: No  . Sexual Activity: Not Currently   Other Topics Concern  . Not on file   Social History Narrative    Family History  Problem Relation Age of Onset  . Coronary artery disease Mother   . Coronary artery disease Father   . Diabetes Sister   . Suicidality Brother   . Cirrhosis Brother   . Colon cancer Neg Hx     Current Outpatient Prescriptions on File Prior to Visit  Medication Sig Dispense Refill  . albuterol-ipratropium (COMBIVENT) 18-103 MCG/ACT inhaler Inhale 1-2 puffs into the lungs every 6 (six) hours as needed for wheezing or shortness of breath.    . Amino Acids-Protein Hydrolys (FEEDING SUPPLEMENT, PRO-STAT SUGAR FREE 64,) LIQD Take 30 mLs by mouth 3 (three) times daily with meals. 900 mL 0  . aspirin EC 81 MG tablet Take 1 tablet (81 mg total) by mouth daily. 30 tablet 1  . atorvastatin (LIPITOR) 40 MG tablet Take 40 mg by mouth daily.    . cephALEXin (KEFLEX) 500 MG capsule Take 1 capsule (500 mg total) by mouth every 12 (twelve) hours. 10 capsule 0  . citalopram (CELEXA) 20 MG tablet Take 20 mg by mouth daily.    . clopidogrel (PLAVIX) 75 MG tablet Take 75 mg by mouth daily.    [name] doxycycline (VIBRA-TABS) 100 MG tablet Take 1 tablet (100 mg total) by mouth every 12 (twelve) hours. 10 tablet 0  . fenofibrate 160 MG tablet Take 160 mg by mouth daily with breakfast.     . furosemide (LASIX) 20 MG tablet Take 40 mg by mouth daily.    [name] gabapentin (NEURONTIN) 100 MG capsule Take 300 mg by mouth at bedtime.     [name] glipiZIDE (GLUCOTROL XL) 2.5 MG 24 hr tablet Take 2.5 mg by mouth 2 (two) times daily.     [name] HYDROcodone-acetaminophen (NORCO/VICODIN) 5-325 MG per tablet  Take 1 tablet by mouth 4 (four) times daily as needed for moderate pain. 30 tablet 0  . insulin aspart (NOVOLOG) 100 UNIT/ML injection Inject 0-15 Units into the skin 3 (three) times daily with meals. 10 mL 11  . insulin glargine (LANTUS) 100 UNIT/ML injection Inject 0.15 mLs (15 Units total) into the skin daily. (Patient taking differently: Inject  10-30 Units into the skin daily as needed (for elevated blood sugar levels). ) 10 mL 11  . isosorbide mononitrate (IMDUR) 30 MG 24 hr tablet Take 1 tablet (30 mg total) by mouth daily. 30 tablet 0  . levalbuterol (XOPENEX) 1.25 MG/3ML nebulizer solution Take 0.63 mg by nebulization every 4 (four) hours as needed for wheezing or shortness of breath. 72 mL 0  . lisinopril (PRINIVIL,ZESTRIL) 20 MG tablet Take 20 mg by mouth daily.     [name] loratadine (CLARITIN) 10 MG tablet Take 10 mg by mouth daily.    . metFORMIN (GLUCOPHAGE) 1000 MG tablet Take 1,000 mg by mouth every morning.     . metolazone (ZAROXOLYN) 2.5 MG tablet TAKE ONE (1) TABLET EACH DAY 30 tablet 0  . metoprolol tartrate (LOPRESSOR) 25 MG tablet Take 25 mg by mouth 2 (two) times daily.     . nitroGLYCERIN (NITROSTAT) 0.4 MG SL tablet Place 0.4 mg under the tongue every 5 (five) minutes x 3 doses as needed for chest pain.     . OXYGEN Inhale 3 L into the lungs daily as needed (shortess of breath).    . Pancrelipase, Lip-Prot-Amyl, (CREON) 24000 UNITS CPEP Take 48,000-72,000 Units by mouth See admin instructions. Take 72000 units 3 times daily with all meals and take 48000 units with snacks    . pantoprazole (PROTONIX) 40 MG tablet Take 1 tablet (40 mg total) by mouth daily. 30 tablet 1  . spironolactone (ALDACTONE) 25 MG tablet Take 25 mg by mouth daily.      . SYMBICORT 160-4.5 MCG/ACT inhaler Inhale 1-2 puffs into the lungs 2 (two) times daily as needed (shorntess of breath).     . tamsulosin (FLOMAX) 0.4 MG CAPS capsule Take 0.4 mg by mouth at bedtime.    [name] tiotropium (SPIRIVA) 18 MCG inhalation  capsule Place 18 mcg into inhaler and inhale daily.    . traZODone (DESYREL) 50 MG tablet Take 50 mg by mouth at bedtime.     No current facility-administered medications on file prior to visit.    Allergies  Allergen Reactions  . Lovaza [Omega-3-Acid Ethyl Esters] Itching  . Lyrica [Pregabalin] Hives  . Zithromax [Azithromycin]     rash  . Levaquin [Levofloxacin] Hives    Review of Systems: As listed above, otherwise negative.      ROS: [x]  Positive [ ]  Negative [ ]  All sytems reviewed and are negative  Cardiovascular: []  chest pain/pressure []  palpitations []  SOB lying flat []  DOE [x]  pain in legs while walking [x]  pain in feet when lying flat []  hx of DVT []  hx of phlebitis [x]  swelling in legs []  varicose veins  Pulmonary: []  productive cough []  asthma []  wheezing  Neurologic: [x]  weakness in []  arms [x]  legs-bilaterally []  numbness in []  arms []  legs [] difficulty speaking or slurred speech []  temporary loss of vision in one eye []  dizziness  Hematologic: []  bleeding problems []  problems with blood clotting easily  GI []  vomiting blood []  blood in stool  GU: []  burning with urination []  blood in urine  Psychiatric: []  hx of major depression  Integumentary: []  rashes [x]  ulcers-left leg  Constitutional: []  fever []  chills         Physical Examination  Filed Vitals:   04/06/15 1351  BP: 136/64  Pulse: 70  Resp: 18  Height: 5\' 10"  (1.778 m)  Weight: 151 lb (68.493 kg)    General: A&O x 3, WDWN  Pulmonary: Sym exp, good air movt,  CTAB, no rales, rhonchi, but positive wheezing and he is now on 3L of O2 at home  Cardiac: RRR, Nl S1, S2, no Murmurs, rubs or gallops  Gastrointestinal: soft, NTND, -G/R, - HSM, - masses, - CVAT B  Extremities:  left above-knee amputation healing well still was some edema   Vascular Exam/Pulses: 2+ femoral pulses bilaterally   Laboratory See [place]  is a 74 y.o. male who presents with: Right Great toe dry gangrene ulcer with cellulitis.  At this point his pain is sever and his leg is not healing.  He will be scheduled for R AKA amputation by Dr. [name] on 05/03/2015.  We will try to use a spinal block due to his continued smoking and poor lung function.  He was seen in the office today in conjunction with Dr. Carvel Getting, EMMA [facility] PA-C [facility] [place] Office: [phone]   04/06/2015, 2:47 PM  Patient with dry gangrenous changes right first toe. He is overall very deconditioned. He has severe pulmonary dysfunction. He is not a candidate for any further revascularization procedures. I offered the patient today palliative pain control with narcotics or consideration for right above-knee amputation. He has opted for an amputation at this point. This is scheduled for 05/03/2015. We will attempt to do this with a spinal if possible due to his underlying poor pulmonary function.  Stephen Hinds, MD [facility] [place] Office: [phone] Pager: [phone]

## 2015-05-03 NOTE — Anesthesia Preprocedure Evaluation (Addendum)
Anesthesia Evaluation  Patient identified by MRN, date of birth, ID band Patient awake    Reviewed: Allergy & Precautions, NPO status , Patient's Chart, lab work & pertinent test results  History of Anesthesia Complications Negative for: history of anesthetic complications  Airway Mallampati: I  TM Distance: >3 FB Neck ROM: Full    Dental  (+) Edentulous Upper, Edentulous Lower, Dental Advisory Given   Pulmonary COPDCurrent Smoker,    + wheezing      Cardiovascular hypertension, + CAD, + Past MI, + Cardiac Stents, + CABG, + Peripheral Vascular Disease and +CHF Normal cardiovascular exam Left ventricle: The cavity size was mildly dilated. Wall thickness was normal. Systolic function was moderately to severely reduced. The estimated ejection fraction was in the range of 30% to 35%. Moderate diffuse hypokinesis with distinct regional wall motion abnormalities.   Neuro/Psych negative neurological ROS  negative psych ROS   GI/Hepatic hiatal hernia, PUD, GERD-  ,(+)     substance abuse  ,   Endo/Other  diabetes  Renal/GU      Musculoskeletal   Abdominal   Peds  Hematology   Anesthesia Other Findings   Reproductive/Obstetrics                          Anesthesia Physical Anesthesia Plan  ASA: IV  Anesthesia Plan: MAC and Spinal   Post-op Pain Management:    Induction: Intravenous  Airway Management Planned: Simple Face Mask  Additional Equipment:   Intra-op Plan:   Post-operative Plan: Extubation in OR  Informed Consent: I have reviewed the patients History and Physical, chart, labs and discussed the procedure including the risks, benefits and alternatives for the proposed anesthesia with the patient or authorized representative who has indicated his/her understanding and acceptance.   Dental advisory given  Plan Discussed with: CRNA, Anesthesiologist and  Surgeon  Anesthesia Plan Comments:         Anesthesia Quick Evaluation

## 2015-05-03 NOTE — Anesthesia Procedure Notes (Signed)
Spinal Patient location during procedure: OR Start time: 05/03/2015 8:27 AM End time: 05/03/2015 8:33 AM Staffing Anesthesiologist: Duane Boston Performed by: anesthesiologist  Preanesthetic Checklist Completed: patient identified, surgical consent, pre-op evaluation, timeout performed, IV checked, risks and benefits discussed and monitors and equipment checked Spinal Block Patient position: sitting Prep: Betadine Patient monitoring: cardiac monitor, continuous pulse ox and blood pressure Approach: midline Location: L2-3 Injection technique: single-shot Needle Needle type: Pencan  Needle gauge: 24 G Needle length: 9 cm Additional Notes Functioning IV was confirmed and monitors were applied. Sterile prep and drape, including hand hygiene and sterile gloves were used. The patient was positioned and the spine was prepped. The skin was anesthetized with lidocaine.  Free flow of clear CSF was obtained prior to injecting local anesthetic into the CSF.  The spinal needle aspirated freely following injection.  The needle was carefully withdrawn.  The patient tolerated the procedure well.

## 2015-05-03 NOTE — Transfer of Care (Signed)
Immediate Anesthesia Transfer of Care Note  Patient: Stephen [name]  Procedure(s) Performed: Procedure(s): RIGHT ABOVE KNEE AMPUTATION (Right)  Patient Location: PACU  Anesthesia Type:Spinal  Level of Consciousness: awake, alert  and oriented  Airway & Oxygen Therapy: Patient Spontanous Breathing and Patient connected to nasal cannula oxygen  Post-op Assessment: Report given to RN and Post -op Vital signs reviewed and stable  Post vital signs: Reviewed and stable  Last Vitals:  Filed Vitals:   05/03/15 1007  BP:   Pulse:   Temp: 37.1 C  Resp:     Complications: No apparent anesthesia complications

## 2015-05-03 NOTE — Anesthesia Postprocedure Evaluation (Signed)
Anesthesia Post Note  Patient: Stephen [name]  Procedure(s) Performed: Procedure(s) (LRB): RIGHT ABOVE KNEE AMPUTATION (Right)  Anesthesia type: MAC/SAB  Patient location: PACU  Post pain: Pain level controlled  Post assessment: Patient's Cardiovascular Status Stable, SAB receding  Last Vitals:  Filed Vitals:   05/03/15 1429  BP:   Pulse:   Temp: 36.7 C  Resp:     Post vital signs: Reviewed and stable  Level of consciousness: sedated  Complications: No apparent anesthesia complications

## 2015-05-04 ENCOUNTER — Encounter (HOSPITAL_COMMUNITY): Payer: Self-pay | Admitting: Vascular Surgery

## 2015-05-04 LAB — GLUCOSE, CAPILLARY
GLUCOSE-CAPILLARY: 51 mg/dL — AB (ref 65–99)
GLUCOSE-CAPILLARY: 73 mg/dL (ref 65–99)
Glucose-Capillary: 109 mg/dL — ABNORMAL HIGH (ref 65–99)
Glucose-Capillary: 90 mg/dL (ref 65–99)
Glucose-Capillary: 60 mg/dL — ABNORMAL LOW (ref 65–99)
Glucose-Capillary: 103 mg/dL — ABNORMAL HIGH (ref 65–99)

## 2015-05-04 LAB — CBC
HCT: 26.1 % — ABNORMAL LOW (ref 39.0–52.0)
Hemoglobin: 8.5 g/dL — ABNORMAL LOW (ref 13.0–17.0)
MCH: 28 pg (ref 26.0–34.0)
MCHC: 32.6 g/dL (ref 30.0–36.0)
MCV: 85.9 fL (ref 78.0–100.0)
Platelets: 254 10*3/uL (ref 150–400)
RBC: 3.04 MIL/uL — AB (ref 4.22–5.81)
RDW: 15.5 % (ref 11.5–15.5)
WBC: 8.8 10*3/uL (ref 4.0–10.5)

## 2015-05-04 LAB — BASIC METABOLIC PANEL
ANION GAP: 9 (ref 5–15)
BUN: 7 mg/dL (ref 6–20)
CO2: 33 mmol/L — AB (ref 22–32)
CREATININE: 0.64 mg/dL (ref 0.61–1.24)
Calcium: 8 mg/dL — ABNORMAL LOW (ref 8.9–10.3)
Chloride: 93 mmol/L — ABNORMAL LOW (ref 101–111)
GFR calc Af Amer: >60 mL/min (ref >60)
Glucose, Bld: 139 mg/dL — ABNORMAL HIGH (ref 65–99)
Potassium: 2.8 mmol/L — ABNORMAL LOW (ref 3.5–5.1)
Sodium: 135 mmol/L (ref 135–145)

## 2015-05-04 MED ORDER — GLUCOSE 40 % PO GEL
ORAL | Status: AC
  Administered 2015-05-04: 37.5 g
  Filled 2015-05-04: qty 1

## 2015-05-04 MED ORDER — POTASSIUM CHLORIDE CRYS ER 20 MEQ PO TBCR
20.0000 meq | EXTENDED_RELEASE_TABLET | Freq: Two times a day (BID) | ORAL | Status: AC
  Administered 2015-05-04: 20 meq via ORAL
  Filled 2015-05-04 (×2): qty 1

## 2015-05-04 NOTE — Progress Notes (Signed)
Physical medicine rehabilitation consult requested chart reviewed. Patient status post left AKA February 2016 of which at that time discharged to a skilled nursing facility. He has now undergone right AKA 05/03/2015. Physical occupational therapy evaluations are pending. Need to confirm disposition at time of discharge. Hold on formal rehabilitation consult at this time until therapy needs addressed and discharge plan

## 2015-05-04 NOTE — Progress Notes (Signed)
OT Cancellation Note  Patient Details Name: Stephen [name] MRN: [id number] DOB: 11/24/1940   Cancelled Treatment:    Reason Eval/Treat Not Completed: Other (comment) Pt confused. Nsging cleaning pt up from pulling out IV, pulling off monitor and urinating on self. Will attempt to see in the am.  [place], OTR/L  [phone] 05/04/2015 05/04/2015, 4:31 PM

## 2015-05-04 NOTE — Progress Notes (Addendum)
  [facility] Progress Note  Subjective  - POD #1  Confused this am. Says his leg is throbbing.   Objective Filed Vitals:   05/04/15 0355  BP: 138/48  Pulse: 59  Temp: 98.2 F (36.8 C)  Resp: 18    Intake/Output Summary (Last 24 hours) at 05/04/15 0745 Last data filed at 05/03/15 1800  Gross per 24 hour  Intake 1452.5 ml  Output    600 ml  Net  852.5 ml    A x O to person and time Right AKA dressing clean and dry  Assessment/Planning: 74 y.o. male is s/p: right AKA 1 Day Post-Op   Dressing clean. Will take down tomorrow. PT/OT today.  CSW consult for SNF placement. Replete K.  DVT prophylaxis:  Lovenox   [place]via [place] 05/04/2015 7:45 AM --  Laboratory CBC    Component Value Date/Time   WBC 8.8 05/04/2015 0325   HGB 8.5* 05/04/2015 0325   HCT 26.1* 05/04/2015 0325   PLT 254 05/04/2015 0325    BMET    Component Value Date/Time   NA 135 05/04/2015 0325   K 2.8* 05/04/2015 0325   CL 93* 05/04/2015 0325   CO2 33* 05/04/2015 0325   GLUCOSE 139* 05/04/2015 0325   BUN 7 05/04/2015 0325   CREATININE 0.64 05/04/2015 0325   CALCIUM 8.0* 05/04/2015 0325   GFRNONAA >60 05/04/2015 0325   GFRAA >60 05/04/2015 0325    COAG Lab Results  Component Value Date   INR 1.08 12/08/2014   INR 0.97 12/03/2014   INR 1.04 10/29/2013   No results found for: PTT  Antibiotics Anti-infectives    Start     Dose/Rate Route Frequency Ordered Stop   05/03/15 2000  cefUROXime (ZINACEF) 1.5 g in dextrose 5 % 50 mL IVPB     1.5 g 100 mL/hr over 30 Minutes Intravenous Every 12 hours 05/03/15 1506 05/04/15 1959   05/02/15 1339  cefUROXime (ZINACEF) 1.5 g in dextrose 5 % 50 mL IVPB     1.5 g 100 mL/hr over 30 Minutes Intravenous 30 min pre-op 05/02/15 1339 05/03/15 0835       Virgina Jock, PA-C [facility] Office: [phone] Pager: [phone] 05/04/2015 7:45 AM  Having some pain.  Confusion with oxycodone.  Hypokalemia  replete.  Acute blood loss anemia trend for now transfuse for hemoglobin less than 8 or symptoms.  Saline lock IV.  Ruta Hinds, MD [facility] [place] Office: [phone] Pager: [phone]

## 2015-05-04 NOTE — Care Management Note (Signed)
Case Management Note  Patient Details  Name: Stephen [name] MRN: [id number] Date of Birth: 03-Oct-1941  Subjective/Objective:  Pt admitted with gangrene of foot                  Action/Plan:  Pt was already AKA, pt had other leg amputated during this admit.  Pt was independent at home with wife and son.  CM will continue to monitor for dispostion   Expected Discharge Date:                  Expected Discharge Plan:  Home/Self Care  In-House Referral:  Clinical Social Work  Discharge planning Services  CM Consult  Post Acute Care Choice:    Choice offered to:     DME Arranged:    DME Agency:     HH Arranged:    [place] Agency:     Status of Service:  In process, will continue to follow  Medicare Important Message Given:    Date Medicare IM Given:    Medicare IM give by:    Date Additional Medicare IM Given:    Additional Medicare Important Message give by:     If discussed at [place] of Stay Meetings, dates discussed:    Additional Comments: CM assessed pt , pt is new bilateral AKA.  Prior to this admission per pt; he was independent at home with wife with the aid of a wheelchair.   Maryclare Labrador, RN 05/04/2015, 2:50 PM

## 2015-05-04 NOTE — Progress Notes (Signed)
Pt remains confused this afternoon.  Continually stating he wants to go to bed, when he is already in the bed. The patient pulled out IV removed nasal cannula, took off hospital gown, urinated in the bed and removed his monitor.  IV team notified to come restart IV after 2 attempts.

## 2015-05-04 NOTE — Progress Notes (Signed)
Pt still very confused stating that penis is locked.  He urinated all over his gown and the bed only some got into the urinal.  Keeps yelling out help and you go into the room and he is not able to tell you what he needs.  Will continue to monitor.

## 2015-05-04 NOTE — Clinical Social Work Note (Signed)
Clinical Social Work Assessment  Patient Details  Name: Stephen [name] MRN: [id number] Date of Birth: 1941/01/16  Date of referral:  05/04/15               Reason for consult:  Facility Placement                Permission sought to share information with:  Family Supports, Customer service manager Permission granted to share information::  No (pt disoriented)  Name::     Stephen [name] and [name]::  [facility]/ [facility] SNF  Relationship::  wife, son  Contact Information:     Housing/Transportation Living arrangements for the past 2 months:  Single Family Home Source of Information:  Adult Children, Spouse Patient Interpreter Needed:  None Criminal Activity/Legal Involvement Pertinent to Current Situation/Hospitalization:  No - Comment as needed Significant Relationships:  None Lives with:    Do you feel safe going back to the place where you live?  Yes Need for family participation in patient care:  Yes (Comment)  Care giving concerns:  Pt lives at home with son and wife- wife is dependent on walker and son works from 6-2:30pm everyday   Facilities manager / plan:  CSW discussed PT recommendation for SNF  Employment status:  Retired Nurse, adult PT Recommendations:  [place] / Referral to community resources:  [place]  Patient/Family's Response to care:  Patients wife was very confused on the phone and unable to have a conversation with CSW due to hearing loss/general confusion- CSW requested to speak with son who confirmed that rehab was the best plan and that the pt had been to [facility] before and would like to return  Patient/Family's Understanding of and Emotional Response to Diagnosis, Current Treatment, and Prognosis:  Patient has a AKA in the past and both pt and family aware of the process- no current questions or concerns  Emotional  Assessment Appearance:  Appears stated age Attitude/Demeanor/Rapport:  Unable to Assess Affect (typically observed):  Unable to Assess Orientation:  Oriented to Self Alcohol / Substance use:  Not Applicable Psych involvement (Current and /or in the community):  No (Comment)  Discharge Needs  Concerns to be addressed:  Adjustment to Illness, Care Coordination Readmission within the last 30 days:  No Current discharge risk:  Physical Impairment Barriers to Discharge:  Continued Medical Work up   [employer], LCSW 05/04/2015, 3:45 PM

## 2015-05-04 NOTE — Evaluation (Signed)
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 Pt confused post-op and unable to provide any information re: home environment or caregiver support. (One note in previous chart states spouse is disabled. Also noted pt discharged to SNF for therapies previously). Patient will benefit from skilled PT to increase their independence and safety with mobility to allow discharge to the venue listed below.       Follow Up Recommendations SNF;Supervision/Assistance - 24 hour    Equipment Recommendations  None recommended by PT (for d/c to SNF)    Recommendations for Other Services       Precautions / Restrictions Precautions Precautions: Fall      Mobility  Bed Mobility Overal bed mobility: Needs Assistance Bed Mobility: Rolling;Supine to Sit Rolling: Mod assist   Supine to sit: Min guard;HOB elevated     General bed mobility comments: rolling Rt and Lt with rails (pt initially resisting and then assists); pt pulls on bil rails and comes up to long-sitting position  Transfers Overall transfer level: Needs assistance Equipment used: None Transfers: Comptroller transfers: Min guard   General transfer comment: posterior scooting in long-sitting towards HOB, pt able to move his hips 3 inches without assistance  Ambulation/Gait                Stairs            Wheelchair Mobility    Modified Rankin (Stroke Patients Only)       Balance Overall balance assessment: Needs assistance Sitting-balance support: No upper  extremity supported;Feet unsupported Sitting balance-Leahy Scale: Poor                                       Pertinent Vitals/Pain Pain Assessment: Faces Faces Pain Scale: Hurts whole lot Pain Location: RLE Pain Intervention(s): Limited activity within patient's tolerance;Monitored during session;Premedicated before session;Repositioned    Home Living Family/patient expects to be discharged to:: Skilled nursing facility Living Arrangements: Spouse/significant other;Children               Additional Comments: per chart, pt's spouse is disabled    Prior Function           Comments: unknown, pt confused & no family present     Hand Dominance        Extremity/Trunk Assessment   Upper Extremity Assessment: Generalized weakness (grossly 4/5 biceps/triceps (though pt confused))           Lower Extremity Assessment: RLE deficits/detail;LLE deficits/detail RLE Deficits / Details: very painful new AKA; pt refused to move it, however with rolling to side noted full hip extension LLE Deficits / Details: pt only partially cooperates with exam due to RLE pain; appears to have hip flexor contracture of ~20 degrees  Cervical / Trunk Assessment: Kyphotic (in long sitting)  Communication   Communication: No difficulties  Cognition Arousal/Alertness: Awake/alert Behavior During Therapy: Anxious Overall Cognitive Status: No family/caregiver present to determine baseline cognitive functioning  General Comments      Exercises        Assessment/Plan    PT Assessment Patient needs continued PT services  PT Diagnosis Generalized weakness;Acute pain   PT Problem List Decreased strength;Decreased range of motion;Decreased activity tolerance;Decreased balance;Decreased mobility;Decreased cognition;Decreased knowledge of use of DME;Decreased safety awareness;Pain  PT Treatment Interventions DME instruction;Functional mobility  training;Therapeutic activities;Therapeutic exercise;Balance training;Cognitive remediation;Patient/family education;Wheelchair mobility training   PT Goals (Current goals can be found in the Care Plan section) Acute Rehab PT Goals Patient Stated Goal: unable to state due to confusion PT Goal Formulation: Patient unable to participate in goal setting Time For Goal Achievement: 05/18/15 Potential to Achieve Goals: Fair    Frequency Min 3X/week   Barriers to discharge   unknown due to pt's  confusion    Co-evaluation               End of Session Equipment Utilized During Treatment: Oxygen Activity Tolerance: Patient limited by pain Patient left: in bed;with call bell/phone within reach;with bed alarm set Nurse Communication: Mobility status         Time: 1359-1419 PT Time Calculation (min) (ACUTE ONLY): 20 min   Charges:   PT Evaluation $Initial PT Evaluation Tier I: 1 Procedure     PT G Codes:        Jordanna Hendrie 2015-05-16, 2:36 PM Pager [phone]

## 2015-05-04 NOTE — Clinical Social Work Placement (Signed)
   CLINICAL SOCIAL WORK PLACEMENT  NOTE  Date:  05/04/2015  Patient Details  Name: Stephen [name] MRN: [id number] Date of Birth: 02-19-1941  Clinical Social Work is seeking post-discharge placement for this patient at the [place] level of care (*CSW will initial, date and re-position this form in  chart as items are completed):  Yes   Patient/family provided with [facility]'s list of facilities offering this level of care within the geographic area requested by the patient (or if unable, by the patient's family).  Yes   Patient/family informed of their freedom to choose among providers that offer the needed level of care, that participate in Medicare, Medicaid or managed care program needed by the patient, have an available bed and are willing to accept the patient.  Yes   Patient/family informed of [facility]'s ownership interest in [facility] and [facility], as well as of the fact that they are under no obligation to receive care at these facilities.  PASRR submitted to EDS on       PASRR number received on       Existing PASRR number confirmed on 05/04/15     FL2 transmitted to all facilities in geographic area requested by pt/family on 05/04/15     FL2 transmitted to all facilities within larger geographic area on       Patient informed that his/her managed care company has contracts with or will negotiate with certain facilities, including the following:            Patient/family informed of bed offers received.  Patient chooses bed at       Physician recommends and patient chooses bed at      Patient to be transferred to   on  .  Patient to be transferred to facility by       Patient family notified on   of transfer.  Name of family member notified:        PHYSICIAN Please sign FL2     Additional Comment:    _______________________________________________ Cranford Mon, LCSW 05/04/2015, 3:47 PM

## 2015-05-05 LAB — CBC
HCT: 25.2 % — ABNORMAL LOW (ref 39.0–52.0)
Hemoglobin: 8.2 g/dL — ABNORMAL LOW (ref 13.0–17.0)
MCH: 27.8 pg (ref 26.0–34.0)
MCHC: 32.5 g/dL (ref 30.0–36.0)
MCV: 85.4 fL (ref 78.0–100.0)
PLATELETS: 351 10*3/uL (ref 150–400)
RBC: 2.95 MIL/uL — ABNORMAL LOW (ref 4.22–5.81)
RDW: 16 % — AB (ref 11.5–15.5)
WBC: 12.6 10*3/uL — AB (ref 4.0–10.5)

## 2015-05-05 LAB — GLUCOSE, CAPILLARY
GLUCOSE-CAPILLARY: 93 mg/dL (ref 65–99)
GLUCOSE-CAPILLARY: 111 mg/dL — AB (ref 65–99)
Glucose-Capillary: 68 mg/dL (ref 65–99)
Glucose-Capillary: 82 mg/dL (ref 65–99)
Glucose-Capillary: 134 mg/dL — ABNORMAL HIGH (ref 65–99)
Glucose-Capillary: 216 mg/dL — ABNORMAL HIGH (ref 65–99)
Glucose-Capillary: 109 mg/dL — ABNORMAL HIGH (ref 65–99)
Glucose-Capillary: 135 mg/dL — ABNORMAL HIGH (ref 65–99)
Glucose-Capillary: 27 mg/dL — CL (ref 65–99)
Glucose-Capillary: 127 mg/dL — ABNORMAL HIGH (ref 65–99)

## 2015-05-05 LAB — BASIC METABOLIC PANEL
Anion gap: 9 (ref 5–15)
BUN: 11 mg/dL (ref 6–20)
CO2: 32 mmol/L (ref 22–32)
Calcium: 8.1 mg/dL — ABNORMAL LOW (ref 8.9–10.3)
Chloride: 95 mmol/L — ABNORMAL LOW (ref 101–111)
Creatinine, Ser: 0.58 mg/dL — ABNORMAL LOW (ref 0.61–1.24)
GFR calc Af Amer: >60 mL/min (ref >60)
Glucose, Bld: 32 mg/dL — CL (ref 65–99)
Potassium: 2.9 mmol/L — ABNORMAL LOW (ref 3.5–5.1)
Sodium: 136 mmol/L (ref 135–145)

## 2015-05-05 MED ORDER — DEXTROSE 50 % IV SOLN
INTRAVENOUS | Status: AC
  Administered 2015-05-05: 50 mL
  Filled 2015-05-05: qty 50

## 2015-05-05 MED ORDER — GLUCERNA SHAKE PO LIQD
237.0000 mL | Freq: Three times a day (TID) | ORAL | Status: DC
  Administered 2015-05-05 – 2015-05-06 (×3): 237 mL via ORAL

## 2015-05-05 MED ORDER — DEXTROSE 50 % IV SOLN
INTRAVENOUS | Status: AC
  Administered 2015-05-05: 50
  Filled 2015-05-05: qty 50

## 2015-05-05 MED ORDER — HYDROCODONE-ACETAMINOPHEN 5-325 MG PO TABS: 1.0000 | ORAL_TABLET | Freq: Four times a day (QID) | ORAL | Status: DC | PRN

## 2015-05-05 MED ORDER — POTASSIUM CHLORIDE CRYS ER 20 MEQ PO TBCR
40.0000 meq | EXTENDED_RELEASE_TABLET | Freq: Two times a day (BID) | ORAL | Status: AC
  Administered 2015-05-05 (×2): 40 meq via ORAL
  Filled 2015-05-05 (×3): qty 2

## 2015-05-05 MED ORDER — POTASSIUM CHLORIDE CRYS ER 20 MEQ PO TBCR: 20.0000 meq | EXTENDED_RELEASE_TABLET | Freq: Two times a day (BID) | ORAL | Status: DC

## 2015-05-05 MED ORDER — GLUCOSE 40 % PO GEL
ORAL | Status: AC
Start: 2015-05-05 — End: 2015-05-05
  Administered 2015-05-05: 37.5 g
  Filled 2015-05-05: qty 1

## 2015-05-05 NOTE — Care Management (Signed)
Important Message  Patient Details  Name: Stephen [name] MRN: [id number] Date of Birth: 27-Jan-1941   Medicare Important Message Given:  Yes-second notification given    Maryclare Labrador, RN 05/05/2015, 11:33 AM

## 2015-05-05 NOTE — Discharge Summary (Signed)
Discharge Summary    Stephen [name] [birth year] 74 y.o. male  [id number]  Admission Date: 05/03/2015  Discharge Date: 05/05/15  Physician: Elam Dutch, MD  Admission Diagnosis: Right great toe dry gangrene ulcer I96   HPI:   This is a 74 y.o. male who presents with chief complaint: Right foot ulcer and cellulitis. The patient presents today for discussion of treatment option. He has been on oral antibiotics and daily dry dressing to his right foot. He states he has daily pain and significant edema on and off. He is s/p Aortogram with right EIA diamondback athrectomy, right EIA stent, left CIA and EIA athrectomy and left CIA and EIA stent 09/23/14. He states the wounds on the right leg have healed.   He is on a statin for his hypercholesterolemia. He is on a beta blocker and ACEI for his hypertension. He is also diabetic and takes insulin as well as oral agents for this.  Hospital Course:  The patient was admitted to the hospital and taken to the operating room on 05/03/2015 and underwent: Right AKA    The pt tolerated the procedure well and was transported to the PACU in good condition.   On POD 1, the pt did have some confusion.  His CBG was 32.  His Oxycodone was also changed to Vicodin.  The pt's mental status did improve by POD 2.    On POD 2, his dressing was removed and his wound was clean and dry with staples in tact.  He did have good ROM with his stump.  A retention sock was ordered.  He did have hypokalemia and this was supplemented.  He will be discharged with K-dur 55mEq bid x 2 doses.  The remainder of the hospital course consisted of increasing mobilization and increasing intake of solids without difficulty.  CBC    Component Value Date/Time   WBC 12.6* 05/05/2015 0320   RBC 2.95* 05/05/2015 0320   HGB 8.2* 05/05/2015 0320   HCT 25.2* 05/05/2015 0320   PLT 351 05/05/2015 0320   MCV 85.4 05/05/2015 0320   MCH 27.8 05/05/2015 0320   MCHC 32.5  05/05/2015 0320   RDW 16.0* 05/05/2015 0320   LYMPHSABS 0.9 02/10/2015 1705   MONOABS 0.6 02/10/2015 1705   EOSABS 0.1 02/10/2015 1705   BASOSABS 0.0 02/10/2015 1705    BMET    Component Value Date/Time   NA 136 05/05/2015 0320   K 2.9* 05/05/2015 0320   CL 95* 05/05/2015 0320   CO2 32 05/05/2015 0320   GLUCOSE 32* 05/05/2015 0320   BUN 11 05/05/2015 0320   CREATININE 0.58* 05/05/2015 0320   CALCIUM 8.1* 05/05/2015 0320   GFRNONAA >60 05/05/2015 0320   GFRAA >60 05/05/2015 0320          Discharge Instructions    Call MD for:  redness, tenderness, or signs of infection (pain, swelling, bleeding, redness, odor or green/yellow discharge around incision site)    Complete by:  As directed      Call MD for:  severe or increased pain, loss or decreased feeling  in affected limb(s)    Complete by:  As directed      Call MD for:  temperature >100.5    Complete by:  As directed      Discharge wound care:    Complete by:  As directed   Shower daily with soap and water starting 05/06/15     Resume previous diet    Complete by:  As directed            Discharge Diagnosis:  Right great toe dry gangrene ulcer I96  Secondary Diagnosis: Patient Active Problem List   Diagnosis Date Noted  . Gangrene of foot 05/03/2015  . Cellulitis and abscess of leg 02/10/2015  . Foot ulcer, right 02/10/2015  . Coronary artery disease 02/10/2015  . Cellulitis 02/10/2015  . Type 2 diabetes mellitus with other specified complication   . Shortness of breath 12/30/2014  . Troponin level elevated 12/30/2014  . Chronic respiratory failure with hypoxia 12/30/2014  . Anemia 12/30/2014  . Cardiomyopathy, ischemic   . Gangrene 12/03/2014  . Pancreatic insufficiency 12/03/2014  . Wound infection 12/03/2014  . Type 2 diabetes mellitus with diabetic peripheral angiopathy with gangrene   . PAD (peripheral artery disease) 09/09/2014  . Atherosclerotic PVD with ulceration 09/08/2014  . COPD (chronic  obstructive pulmonary disease) 08/16/2014  . Congestive heart failure 08/16/2014  . Essential hypertension 08/10/2014  . Cigarette smoker 08/10/2014  . Hypoglycemia 07/22/2014  . Hypothermia 07/22/2014  . Demand ischemia 07/14/2014  . Elevated troponin 07/13/2014  . Acute on chronic systolic CHF (congestive heart failure), NYHA class 3 07/13/2014  . Acute respiratory failure with hypoxia 07/11/2014  . Atherosclerosis 07/10/2014  . Ulcer of left lower extremity 07/10/2014  . CAP (community acquired pneumonia) 10/29/2013  . COPD exacerbation 10/29/2013  . NSTEMI (non-ST elevated myocardial infarction) 10/27/2013  . Dysphagia 03/24/2012  . Early satiety 03/24/2012  . Encounter for screening colonoscopy 11/11/2011  . Chronic pancreatitis 06/20/2011  . History of alcohol abuse 06/05/2011  . HTN (hypertension) 06/05/2011  . Chronic combined systolic and diastolic congestive heart failure 06/05/2011  . Secondary DM with complication 82/95/6213  . Hyperlipemia 06/04/2011  . PVD (peripheral vascular disease) 06/04/2011  . Systolic CHF, chronic 08/65/7846  . GYNECOMASTIA, UNILATERAL 08/17/2010  . CARDIOMYOPATHY, ISCHEMIC 05/18/2010  . COPD II/III still smoking  05/18/2010  . CORONARY ARTERY BYPASS GRAFT, HX OF 05/18/2010  . CLAUDICATION 04/11/2010  . HYPERKALEMIA 01/18/2010  . 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 Chronic ischemic heart disease 10/20/2009  . Atherosclerosis of native arteries of extremity with intermittent claudication 10/20/2009  . Abdominal aneurysm without mention of rupture 10/20/2009  . AORTIC ANEURYSM OF UNSPECIFIED SITE RUPTURED 10/20/2009  . PERS HX SURG HRT&GREAT VES PRS HAZARDS HEALTH 10/20/2009   Past Medical History  Diagnosis Date  . Hypertension   . Pancreatitis   . Neuropathy   . Hyperlipemia   . Peripheral vascular disease     a. 10/2013: severely abnormal ABIs, for OP evaluation.  [name] GERD (gastroesophageal reflux disease)    . PUD (peptic ulcer disease)     diagnosed via EGD by Dr. Braulio Bosch at [place]  . Diverticulosis   . Hemorrhoids   . Coronary artery disease      a. BMS to LAD 1999. b. HSRA to LAD and stent to RCA 2007. c. NSTEMI/CABG x 3 in 04/2010. d. NSTEMI 10/2013 - secondary to demand ischemia in setting of COPD exacerbation with severe underlying CAD - cath with 3/3 patent grafts, for medical therapy.   [name] COPD (chronic obstructive pulmonary disease)     a. Ongoing tobacco abuse.  . Arthritis   . Neutropenia     a. Dx 10/2013 on labs - instructed to f/u PCP.  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   . Ischemic cardiomyopathy  a. previously EF 30% in 2011. b. 10/2013: EF 45-50% by echo.  . Alcoholism     a. Remote alcoholism  . 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  [name] Ulcer of right foot   . Chronic kidney disease   . Sinusitis   . Myocardial infarction   . Shortness of breath dyspnea   . History of hiatal hernia   . Pneumonia     "a couple times; years ago' (05/03/2015)  . Type II diabetes mellitus        Medication List    STOP taking these medications        cephALEXin 500 MG capsule  Commonly known as:  KEFLEX      TAKE these medications        albuterol-ipratropium 18-103 MCG/ACT inhaler  Commonly known as:  COMBIVENT  Inhale 1-2 puffs into the lungs every 6 (six) hours as needed for wheezing or shortness of breath.     aspirin 325 MG tablet  Take 325 mg by mouth daily.     atorvastatin 40 MG tablet  Commonly known as:  LIPITOR  Take 40 mg by mouth daily.     citalopram 20 MG tablet  Commonly known as:  CELEXA  Take 20 mg by mouth daily.     CREON 24000 UNITS Cpep  Generic drug:  Pancrelipase (Lip-Prot-Amyl)  Take 51,025-85,277 Units by mouth See admin instructions. Take 72000 units 3 times daily with all meals and take 48000 units with snacks      doxycycline 100 MG tablet  Commonly known as:  VIBRA-TABS  Take 1 tablet (100 mg total) by mouth every 12 (twelve) hours.     feeding supplement (PRO-STAT SUGAR FREE 64) Liqd  Take 30 mLs by mouth 3 (three) times daily with meals.     fenofibrate 160 MG tablet  Take 160 mg by mouth daily with breakfast.     furosemide 20 MG tablet  Commonly known as:  LASIX  Take 40 mg by mouth daily.     HYDROcodone-acetaminophen 5-325 MG per tablet  Commonly known as:  NORCO/VICODIN  Take 1 tablet by mouth 4 (four) times daily as needed for moderate pain.     insulin aspart 100 UNIT/ML injection  Commonly known as:  novoLOG  Inject 0-15 Units into the skin 3 (three) times daily with meals.     insulin glargine 100 UNIT/ML injection  Commonly known as:  LANTUS  Inject 0.15 mLs (15 Units total) into the skin daily.     levalbuterol 1.25 MG/3ML nebulizer solution  Commonly known as:  XOPENEX  Take 0.63 mg by nebulization every 4 (four) hours as needed for wheezing or shortness of breath.     lisinopril 20 MG tablet  Commonly known as:  PRINIVIL,ZESTRIL  Take 20 mg by mouth daily.     loratadine 10 MG tablet  Commonly known as:  CLARITIN  Take 10 mg by mouth daily.     metFORMIN 1000 MG tablet  Commonly known as:  GLUCOPHAGE  Take 1,000 mg by mouth 2 (two) times daily with a meal.     metolazone 2.5 MG tablet  Commonly known as:  ZAROXOLYN  TAKE ONE (1) TABLET EACH DAY     metoprolol tartrate 25 MG tablet  Commonly known as:  LOPRESSOR  Take 25 mg by mouth 2 (two) times daily.     nitroGLYCERIN 0.4 MG SL tablet  Commonly known  as:  NITROSTAT  Place 0.4 mg under the tongue every 5 (five) minutes x 3 doses as needed for chest pain.     OXYGEN  Inhale 3 L into the lungs daily as needed (shortess of breath).     potassium chloride SA 20 MEQ tablet  Commonly known as:  K-DUR,KLOR-CON  Take 1 tablet (20 mEq total) by mouth 2 (two) times daily.     SYMBICORT 160-4.5 MCG/ACT  inhaler  Generic drug:  budesonide-formoterol  Inhale 1-2 puffs into the lungs 2 (two) times daily as needed (shorntess of breath).     tamsulosin 0.4 MG Caps capsule  Commonly known as:  FLOMAX  Take 0.4 mg by mouth at bedtime.     traZODone 50 MG tablet  Commonly known as:  DESYREL  Take 50 mg by mouth at bedtime as needed for sleep.       PLEASE NOTE THE POTASSIUM IS 45mEq BID X 2 DOSES IF K+ IS STILL LESS THAN 3.2 ON 05/06/15.  Prescriptions given: 1.  Vicodin #30 No Refill 2.  K-dur 53mEq bid x 2 doses on 05/06/15 IF K+ IS STILL LESS THAN 3.2  Labs: Please recheck Potassium 05/06/15  Disposition: SNF  Patient's condition: is Good  Follow up: 1. Dr. [name] in 4 weeks   Leontine Locket, PA-C [facility] [phone] 05/05/2015  8:58 AM

## 2015-05-05 NOTE — Progress Notes (Signed)
BS now 135.  Patient ate all of his dinner.

## 2015-05-05 NOTE — Evaluation (Signed)
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 Strong posterior lean with attempt to sit EOB with pain limiting.  Pt able to perform grooming and eating when trunk is supported.  Requires max assist for bathing and dressing. Incontinent of urine requiring total assist for pericare.  Pt is to discharge to SNF for further rehab.  Will defer OT to SNF.    Follow Up Recommendations  SNF;Supervision/Assistance - 24 hour    Equipment Recommendations       Recommendations for Other Services       Precautions / Restrictions Precautions Precautions: Fall Restrictions Weight Bearing Restrictions: No Other Position/Activity Restrictions: B LE amputations      Mobility Bed Mobility Overal bed mobility: Needs Assistance Bed Mobility: Supine to Sit;Sit to Supine     Supine to sit: Max assist;HOB elevated Sit to supine: Mod assist   General bed mobility comments: attempted sitting EOB, pt with posterior lean and active resistance, difficulty following commands due to impaired cognition, +2 to reposition pt up in bed so he may access his lunch tray  Transfers                      Balance Overall balance assessment: Needs assistance Sitting-balance support: Bilateral upper extremity supported Sitting balance-Leahy Scale: Poor   Postural control: Posterior lean                                  ADL Overall ADL's : Needs assistance/impaired Eating/Feeding: Set up;Sitting Eating/Feeding Details (indicate cue type and reason): mistakes sugar for  salt, assist to cut food Grooming: Wash/dry hands;Wash/dry face;Supervision/safety;Bed level   Upper Body Bathing: Maximal assistance;Bed level   Lower Body Bathing: Maximal assistance;Bed level   Upper Body Dressing : Moderate assistance;Bed level   Lower Body Dressing: Maximal assistance;Bed level                       Vision     Perception     Praxis      Pertinent Vitals/Pain Pain Assessment: Faces Faces Pain Scale: Hurts even more Pain Location: R residual limb Pain Descriptors / Indicators: Grimacing;Operative site guarding Pain Intervention(s): Limited activity within patient's tolerance;Monitored during session;Premedicated before session;Repositioned     Hand Dominance Left   Extremity/Trunk Assessment Upper Extremity Assessment Upper Extremity Assessment: Generalized weakness   Lower Extremity Assessment Lower Extremity Assessment: Defer to PT evaluation   Cervical / Trunk Assessment Cervical / Trunk Assessment: Kyphotic   Communication Communication Communication: No difficulties   Cognition Arousal/Alertness: Awake/alert Behavior During Therapy: WFL for tasks assessed/performed Overall Cognitive Status: No family/caregiver present to determine baseline cognitive functioning       Memory: Decreased short-term memory             General Comments       Exercises       Shoulder Instructions      Home Living Family/patient expects to be discharged to:: Skilled nursing facility Living Arrangements: Spouse/significant other;Children  Additional Comments: per chart, pt's spouse is disabled      Prior Functioning/Environment          Comments: unknown, pt confused & no family present    OT Diagnosis: Generalized weakness;Cognitive deficits;Acute pain   OT Problem List:     OT Treatment/Interventions:      OT Goals(Current goals can be found in the care plan section) Acute Rehab  OT Goals Patient Stated Goal: go home  OT Frequency:     Barriers to D/C:            Co-evaluation              End of Session    Activity Tolerance: Patient limited by pain Patient left: in bed;with call bell/phone within reach;with bed alarm set   Time: 1132-1200 OT Time Calculation (min): 28 min Charges:  OT General Charges $OT Visit: 1 Procedure OT Evaluation $Initial OT Evaluation Tier I: 1 Procedure OT Treatments $Self Care/Home Management : 8-22 mins G-Codes:    Malka So 05/05/2015, 12:08 PM [phone]

## 2015-05-05 NOTE — Progress Notes (Signed)
Family has requested discharge to skilled nursing facility/[facility] in [place]. Hold on formal rehabilitation consult at this time with recommendations being made for skilled nursing facility

## 2015-05-05 NOTE — Progress Notes (Signed)
Pt Bs was 23 with a complaint of sweatiness.  Gave D50 IV and also gave glucose Gel.  Patient alert and eating his dinner. Will rechecked and monitor.

## 2015-05-05 NOTE — Progress Notes (Addendum)
  Progress Note    05/05/2015 7:34 AM 2 Days Post-Op  Subjective:  Alert this morning and less confused   Afebrile 160'V-371'G systolic HR 62'I-94'W NSR 95% 3LO2NC  Filed Vitals:   05/05/15 0505  BP: 126/43  Pulse: 65  Temp: 94.2 F (34.6 C)  Resp:     Physical Exam: Incisions:  Looks good and intact with staples Extremities:  Good ROM of right leg  CBC    Component Value Date/Time   WBC 12.6* 05/05/2015 0320   RBC 2.95* 05/05/2015 0320   HGB 8.2* 05/05/2015 0320   HCT 25.2* 05/05/2015 0320   PLT 351 05/05/2015 0320   MCV 85.4 05/05/2015 0320   MCH 27.8 05/05/2015 0320   MCHC 32.5 05/05/2015 0320   RDW 16.0* 05/05/2015 0320   LYMPHSABS 0.9 02/10/2015 1705   MONOABS 0.6 02/10/2015 1705   EOSABS 0.1 02/10/2015 1705   BASOSABS 0.0 02/10/2015 1705    BMET    Component Value Date/Time   NA 136 05/05/2015 0320   K 2.9* 05/05/2015 0320   CL 95* 05/05/2015 0320   CO2 32 05/05/2015 0320   GLUCOSE 32* 05/05/2015 0320   BUN 11 05/05/2015 0320   CREATININE 0.58* 05/05/2015 0320   CALCIUM 8.1* 05/05/2015 0320   GFRNONAA >60 05/05/2015 0320   GFRAA >60 05/05/2015 0320    INR    Component Value Date/Time   INR 1.08 12/08/2014 0248     Intake/Output Summary (Last 24 hours) at 05/05/15 0734 Last data filed at 05/04/15 2200  Gross per 24 hour  Intake    700 ml  Output    550 ml  Net    150 ml     Assessment/Plan:  74 y.o. male is s/p right above knee amputation  2 Days Post-Op  -pt had confusion yesterday and last night-RN states the pt didn't sleep prior to that and CBG yesterday afternoon was 32.  He is much improved this morning. -hypokalemia-supplement with Kdur 46mEq bid x 2 doses -he did have some confusion with urinary incontinence-protect wound as much as possible -the wound looks good this morning -will order a retention sock to help pt from picking at wound -most likely to [facility] today   Leontine Locket, [facility] [phone] 05/05/2015 7:34 AM   Agree with [facility] today if bed available  Ruta Hinds, MD [facility] [place]: [phone] Pager: [phone]

## 2015-05-05 NOTE — Clinical Documentation Improvement (Signed)
Per MD Progress Note 05/04/15: "confusion with oxycodone" Per RN Progress Note 05/04/15: "Pt still very confused stating that penis is locked. He urinated all over his gown and the bed only some got into the urinal. Keeps yelling out help and you go into the room and he is not able to tell you what he needs." and "Pt remains confused this afternoon. Continually stating he wants to go to bed, when he is already in bed. The patient pulled out IV removed nasal cannula, took off hospital gown, urinated in the bed and removed his monitor."  Please document the etiology of the altered mental status as:  --Coma --Confusion/delirium (including drug-induced) --Drowsiness/somnolence --Transient alteration of awareness --Encephalopathy (type if known)    Anoxic/hypoxic    Drug-induced/toxic (specify drug)    Hepatic    Hypertensive    Hypoglycemic    Metabolic/septic    Other (specify)  Thank You,  Carrolyn Meiers, RN [email or web address] [phone]

## 2015-05-05 NOTE — Progress Notes (Signed)
Pt increasingly agitated with increased urinary urgency and frequency. Bladder scan showed 130 ml volume. MD paged. No new orders received. Will continue to monitor.  Raliegh Ip RN

## 2015-05-05 NOTE — Progress Notes (Signed)
Orthopedic Tech Progress Note Patient Details:  Stephen [name] October 18, 1941 [id number]  Patient ID: Elenora Fender, male   DOB: 1941-07-24, 74 y.o.   MRN: [id number] Called in bio-tech brace order; spoke with Dolores Lory, Michaella Imai 05/05/2015, 9:18 AM

## 2015-05-05 NOTE — Progress Notes (Signed)
Hypoglycemic Event  CBG: 32  Treatment: 25 mg 50% dextrose  Symptoms: Sweaty, confused  Follow-up CBG: Time: 0501 CBG Result:82   Treatment: Peanut butter, milk  Symptoms: no additional   Follow up CBG: 0513 Results: 68    Treatment: 25 mg 50% Dextrose   Symptoms: no additional  Follow up CBG: 0524 Results: 93   Treatment: Orange Juice  Symptoms: No additional  0541 Follow up CBG:  111  Possible Reasons: Unknown  Comments/MD notified:   Raliegh Ip L  Remember to initiate Hypoglycemia Order Set & complete

## 2015-05-05 NOTE — Progress Notes (Signed)
Initial Nutrition Assessment  DOCUMENTATION CODES:  Non-severe (moderate) malnutrition in context of chronic illness  Pt meets criteria for MODERATE MALNUTRITION in the context of chronic illness as evidenced by moderate fat and muscle mass loss.  INTERVENTION:  Glucerna shake po TID, each supplement provides 220 kcal and 10 grams of protein  Encourage adequate PO intake.   NUTRITION DIAGNOSIS:  Increased nutrient needs related to wound healing as evidenced by estimated needs.  GOAL:  Patient will meet greater than or equal to 90% of their needs  MONITOR:  PO intake, Supplement acceptance, Weight trends, Labs, I & O's  REASON FOR ASSESSMENT:  Low Braden    ASSESSMENT: Pt with PMH DM, HTN, PVD, GERD, pancreatitis, CAD, COPD, s/p L AKA, CKD presents with right foot ulcer and cellulitis with the diagnosis of Right great toe dry gangrene ulcer.  Procedure (6/29): Right above knee amputation  Meal completion has been varied from 25-100%. Pt reports appetite is fine currently and PTA at home with consumption of at least 3 meals and an nutritional supplement occasionally. Pt unable to determine name of supplement usually consumed. Noted pt with 11% weight loss in 6 months, however pt with bilateral AKA with both procedures this year. RD to order Glucerna Shake to aid in wound healing. Pt was encouraged to eat his food at meals and to drink his supplements.   Nutrition-Focused physical exam completed. Findings are moderate fat depletion, moderate muscle depletion, and no edema.   Labs: Low potassium, chloride, creatinine, calcium.  Height:  Ht Readings from Last 1 Encounters:  04/27/15 5\' 10"  (1.778 m)    Weight:  Wt Readings from Last 1 Encounters:  04/06/15 151 lb (68.493 kg)    Ideal Body Weight:  63.38 kg (adjusted for bilateral AKA)  Wt Readings from Last 10 Encounters:  04/06/15 151 lb (68.493 kg)  03/09/15 150 lb (68.04 kg)  02/10/15 150 lb 1.6 oz (68.085 kg)   01/05/15 150 lb (68.04 kg)  12/31/14 149 lb 7.6 oz (67.8 kg)  12/09/14 149 lb 0.5 oz (67.6 kg)  11/03/14 170 lb (77.111 kg)  09/23/14 170 lb (77.111 kg)  09/09/14 172 lb (78.019 kg)  09/08/14 172 lb (78.019 kg)    BMI:  There is no weight on file to calculate BMI.  Estimated Nutritional Needs:  Kcal:  1900-2100  Protein:  90-105 gram  Fluid:  1.9 - 2.1 L/day  Skin:  Wound (see comment) (Incision on R leg, L thigh)  Diet Order:  Diet Carb Modified Fluid consistency:: Thin; Room service appropriate?: Yes  EDUCATION NEEDS:  No education needs identified at this time   Intake/Output Summary (Last 24 hours) at 05/05/15 1453 Last data filed at 05/05/15 1300  Gross per 24 hour  Intake    960 ml  Output    800 ml  Net    160 ml    Last BM:  6/29  Corrin Parker, MS, RD, LDN Pager # [phone] After hours/ weekend pager # [phone]

## 2015-05-05 NOTE — Progress Notes (Signed)
Pt has bed at [facility] but is unable to DC to the facility until tomorrow (7/2) when the patient has been without the sitter for 24 hours  fl2 on chart to be signed.  CSW will continue to follow.  Domenica Reamer, [place] Social Worker [phone]

## 2015-05-06 LAB — GLUCOSE, CAPILLARY
GLUCOSE-CAPILLARY: 170 mg/dL — AB (ref 65–99)
Glucose-Capillary: 103 mg/dL — ABNORMAL HIGH (ref 65–99)
Glucose-Capillary: 49 mg/dL — ABNORMAL LOW (ref 65–99)
Glucose-Capillary: 56 mg/dL — ABNORMAL LOW (ref 65–99)
Glucose-Capillary: 59 mg/dL — ABNORMAL LOW (ref 65–99)
Glucose-Capillary: 63 mg/dL — ABNORMAL LOW (ref 65–99)
Glucose-Capillary: 71 mg/dL (ref 65–99)
Glucose-Capillary: 74 mg/dL (ref 65–99)

## 2015-05-06 MED ORDER — INSULIN GLARGINE 100 UNIT/ML ~~LOC~~ SOLN
15.0000 [IU] | Freq: Every day | SUBCUTANEOUS | Status: DC
  Filled 2015-05-06: qty 0.15

## 2015-05-06 MED ORDER — METFORMIN HCL 500 MG PO TABS
1000.0000 mg | ORAL_TABLET | Freq: Two times a day (BID) | ORAL | Status: DC
  Filled 2015-05-06 (×2): qty 2

## 2015-05-06 MED ORDER — POTASSIUM CHLORIDE CRYS ER 20 MEQ PO TBCR
40.0000 meq | EXTENDED_RELEASE_TABLET | Freq: Three times a day (TID) | ORAL | Status: DC
  Administered 2015-05-06: 40 meq via ORAL
  Filled 2015-05-06: qty 2

## 2015-05-06 MED ORDER — OXYCODONE HCL 5 MG PO TABS
5.0000 mg | ORAL_TABLET | ORAL | Status: DC | PRN
Start: 2015-05-06 — End: 2015-05-06
  Administered 2015-05-06 (×2): 5 mg via ORAL
  Filled 2015-05-06 (×2): qty 1

## 2015-05-06 NOTE — Progress Notes (Addendum)
Hypoglycemic Event  CBG: 59  Treatment: Orange Juice  Symptoms: none  Follow-up CBG: Time:0043 CBG Result:56  Possible Reasons for Event: Unknown   CBG:56  Treatment: Peanut Butter  Symptoms: None  Time: 0051  CBG result: 63   CBG:63   Treatment: Ensure, apple sauce   Symptoms: None  Time: 0105  Follow up CBG: 74    Comments/MD notified:    Raliegh Ip L  Remember to initiate Hypoglycemia Order Set & complete

## 2015-05-06 NOTE — Progress Notes (Signed)
Hypoglycemic Event  CBG: 49  Treatment: [place], Peanut butter, Coke  Symptoms: None  Follow-up CBG: Time: 0419 CBG Result:71  Possible Reasons for Event: Unknown  Comments/MD notified:    Delman Kitten  Remember to initiate Hypoglycemia Order Set & complete

## 2015-05-06 NOTE — Progress Notes (Signed)
Patient for d/c today to SNF bed at [place]. Wife and other family members notified by phone and are agreeable to this plan- plan transfer via EMS. Eduard Clos, MSW, [place]

## 2015-05-06 NOTE — Progress Notes (Addendum)
[facility] [place]  Subjective  - Continued hypoglycemic.  Nursing states he is not eating very much.   Objective 157/60 76 98.4 F (36.9 C) (Oral) 20 99%  Intake/Output Summary (Last 24 hours) at 05/06/15 0908 Last data filed at 05/05/15 1700  Gross per 24 hour  Intake    600 ml  Output    400 ml  Net    200 ml    Right AKA incision clean and dry healing well   Assessment/Planning: POD # 3 right AKA Hypoglycemic episodes will hold PO and Lantus if glucose is less than 110 -hypokalemia-supplement with Kdur 23mEq Tid I will change his PO narcotic to percocet from hydrocodone he states that it work better. Pending SNF  Laurence Slate [facility] 05/06/2015 9:08 AM --  Laboratory Lab Results:  Recent Labs  05/04/15 0325 05/05/15 0320  WBC 8.8 12.6*  HGB 8.5* 8.2*  HCT 26.1* 25.2*  PLT 254 351   BMET  Recent Labs  05/04/15 0325 05/05/15 0320  NA 135 136  K 2.8* 2.9*  CL 93* 95*  CO2 33* 32  GLUCOSE 139* 32*  BUN 7 11  CREATININE 0.64 0.58*  CALCIUM 8.0* 8.1*    COAG Lab Results  Component Value Date   INR 1.08 12/08/2014   INR 0.97 12/03/2014   INR 1.04 10/29/2013   No results found for: PTT    Agree with the above D/c to SNF when bed available  Annamarie Major

## 2015-05-12 MED FILL — Dextrose Inj 50%: INTRAVENOUS | Qty: 50 | Status: AC

## 2015-05-15 LAB — GLUCOSE, CAPILLARY: Glucose-Capillary: 21 mg/dL — CL (ref 65–99)

## 2015-05-19 ENCOUNTER — Encounter (HOSPITAL_COMMUNITY): Payer: Self-pay

## 2015-05-19 ENCOUNTER — Emergency Department (HOSPITAL_COMMUNITY): Payer: Medicare Other

## 2015-05-19 ENCOUNTER — Inpatient Hospital Stay (HOSPITAL_COMMUNITY)
Admission: EM | Admit: 2015-05-19 | Discharge: 2015-05-23 | DRG: 871 | Disposition: A | Discharge status: Home Health | Payer: Medicare Other | Attending: Internal Medicine | Admitting: Internal Medicine

## 2015-05-19 DIAGNOSIS — Z833 Family history of diabetes mellitus: Secondary | ICD-10-CM

## 2015-05-19 DIAGNOSIS — D649 Anemia, unspecified: Secondary | ICD-10-CM | POA: Diagnosis present

## 2015-05-19 DIAGNOSIS — I5022 Chronic systolic (congestive) heart failure: Secondary | ICD-10-CM | POA: Diagnosis present

## 2015-05-19 DIAGNOSIS — J449 Chronic obstructive pulmonary disease, unspecified: Secondary | ICD-10-CM | POA: Diagnosis present

## 2015-05-19 DIAGNOSIS — Z9981 Dependence on supplemental oxygen: Secondary | ICD-10-CM

## 2015-05-19 DIAGNOSIS — Z951 Presence of aortocoronary bypass graft: Secondary | ICD-10-CM

## 2015-05-19 DIAGNOSIS — F17220 Nicotine dependence, chewing tobacco, uncomplicated: Secondary | ICD-10-CM | POA: Diagnosis present

## 2015-05-19 DIAGNOSIS — I739 Peripheral vascular disease, unspecified: Secondary | ICD-10-CM | POA: Diagnosis present

## 2015-05-19 DIAGNOSIS — E138 Other specified diabetes mellitus with unspecified complications: Secondary | ICD-10-CM | POA: Diagnosis present

## 2015-05-19 DIAGNOSIS — Y95 Nosocomial condition: Secondary | ICD-10-CM | POA: Diagnosis present

## 2015-05-19 DIAGNOSIS — J9621 Acute and chronic respiratory failure with hypoxia: Secondary | ICD-10-CM | POA: Diagnosis present

## 2015-05-19 DIAGNOSIS — I252 Old myocardial infarction: Secondary | ICD-10-CM

## 2015-05-19 DIAGNOSIS — I129 Hypertensive chronic kidney disease with stage 1 through stage 4 chronic kidney disease, or unspecified chronic kidney disease: Secondary | ICD-10-CM | POA: Diagnosis present

## 2015-05-19 DIAGNOSIS — J9620 Acute and chronic respiratory failure, unspecified whether with hypoxia or hypercapnia: Secondary | ICD-10-CM | POA: Diagnosis present

## 2015-05-19 DIAGNOSIS — K219 Gastro-esophageal reflux disease without esophagitis: Secondary | ICD-10-CM | POA: Diagnosis present

## 2015-05-19 DIAGNOSIS — E785 Hyperlipidemia, unspecified: Secondary | ICD-10-CM | POA: Diagnosis present

## 2015-05-19 DIAGNOSIS — F17210 Nicotine dependence, cigarettes, uncomplicated: Secondary | ICD-10-CM | POA: Diagnosis present

## 2015-05-19 DIAGNOSIS — A419 Sepsis, unspecified organism: Secondary | ICD-10-CM | POA: Diagnosis not present

## 2015-05-19 DIAGNOSIS — R4182 Altered mental status, unspecified: Secondary | ICD-10-CM

## 2015-05-19 DIAGNOSIS — Z89612 Acquired absence of left leg above knee: Secondary | ICD-10-CM

## 2015-05-19 DIAGNOSIS — E1169 Type 2 diabetes mellitus with other specified complication: Secondary | ICD-10-CM | POA: Diagnosis present

## 2015-05-19 DIAGNOSIS — J189 Pneumonia, unspecified organism: Secondary | ICD-10-CM | POA: Diagnosis present

## 2015-05-19 DIAGNOSIS — Z8249 Family history of ischemic heart disease and other diseases of the circulatory system: Secondary | ICD-10-CM

## 2015-05-19 DIAGNOSIS — E119 Type 2 diabetes mellitus without complications: Secondary | ICD-10-CM | POA: Diagnosis present

## 2015-05-19 DIAGNOSIS — R0902 Hypoxemia: Secondary | ICD-10-CM

## 2015-05-19 DIAGNOSIS — N189 Chronic kidney disease, unspecified: Secondary | ICD-10-CM | POA: Diagnosis present

## 2015-05-19 LAB — CBC WITH DIFFERENTIAL/PLATELET
Basophils Absolute: 0 10*3/uL (ref 0.0–0.1)
Basophils Relative: 0 % (ref 0–1)
Eosinophils Absolute: 0 10*3/uL (ref 0.0–0.7)
Eosinophils Relative: 0 % (ref 0–5)
HCT: 22.9 % — ABNORMAL LOW (ref 39.0–52.0)
HEMOGLOBIN: 7.6 g/dL — AB (ref 13.0–17.0)
LYMPHS ABS: 0.5 10*3/uL — AB (ref 0.7–4.0)
Lymphocytes Relative: 3 % — ABNORMAL LOW (ref 12–46)
MCH: 28.5 pg (ref 26.0–34.0)
MCHC: 33.2 g/dL (ref 30.0–36.0)
MCV: 85.8 fL (ref 78.0–100.0)
MONO ABS: 0.8 10*3/uL (ref 0.1–1.0)
Monocytes Relative: 6 % (ref 3–12)
NEUTROS PCT: 91 % — AB (ref 43–77)
Neutro Abs: 12.7 10*3/uL — ABNORMAL HIGH (ref 1.7–7.7)
PLATELETS: 530 10*3/uL — AB (ref 150–400)
RBC: 2.67 MIL/uL — ABNORMAL LOW (ref 4.22–5.81)
RDW: 16.1 % — AB (ref 11.5–15.5)
WBC: 14 10*3/uL — AB (ref 4.0–10.5)

## 2015-05-19 LAB — POC OCCULT BLOOD, ED: Fecal Occult Bld: POSITIVE — AB

## 2015-05-19 LAB — I-STAT CG4 LACTIC ACID, ED: Lactic Acid, Venous: 1.39 mmol/L (ref 0.5–2.0)

## 2015-05-19 MED ORDER — FENTANYL CITRATE (PF) 100 MCG/2ML IJ SOLN
50.0000 ug | Freq: Once | INTRAMUSCULAR | Status: AC
  Administered 2015-05-20: 50 ug via INTRAVENOUS
  Filled 2015-05-19: qty 2

## 2015-05-19 MED ORDER — PIPERACILLIN-TAZOBACTAM 3.375 G IVPB 30 MIN
3.3750 g | Freq: Once | INTRAVENOUS | Status: AC
  Administered 2015-05-20: 3.375 g via INTRAVENOUS
  Filled 2015-05-19: qty 50

## 2015-05-19 MED ORDER — VANCOMYCIN HCL IN DEXTROSE 1-5 GM/200ML-% IV SOLN
1000.0000 mg | Freq: Once | INTRAVENOUS | Status: AC
  Administered 2015-05-20: 1000 mg via INTRAVENOUS
  Filled 2015-05-19: qty 200

## 2015-05-19 NOTE — ED Provider Notes (Signed)
CSN: [id number]     Arrival date & time 05/19/15  2214 History  This chart was scribed for Davonna Belling, MD by Irene Pap, ED Scribe. This patient was seen in room APA16A/APA16A and patient care was started at 10:34 PM.    Chief Complaint  Patient presents with  . Anemia  . Shortness of Breath   The history is provided by the patient and a relative. No language interpreter was used.  HPI Comments: Stephen [name] is a 74 y.o. Male with hx of COPD, CAD, MI, above knee bilateral amputation and dyspnea who presents to the Emergency Department brought in by EMS complaining of SOB and anemia onset 2 days ago. Pt is from the [facility]. Family states that his hemoglobin has been low and has been disoriented. Pt reports headache. Denies hematuria, hematochezia, or hematemesis. Family is unable to tell about blood discharge anywhere.   Past Medical History  Diagnosis Date  . Hypertension   . Pancreatitis   . Neuropathy   . Hyperlipemia   . Peripheral vascular disease     a. 10/2013: severely abnormal ABIs, for OP evaluation.  [name] GERD (gastroesophageal reflux disease)   . PUD (peptic ulcer disease)     diagnosed via EGD by Dr. Braulio Bosch at [facility]  . Diverticulosis   . Hemorrhoids   . Coronary artery disease      a. BMS to LAD 1999. b. HSRA to LAD and stent to RCA 2007. c. NSTEMI/CABG x 3 in 04/2010. d. NSTEMI 10/2013 - secondary to demand ischemia in setting of COPD exacerbation with severe underlying CAD - cath with 3/3 patent grafts, for medical therapy.   [name] COPD (chronic obstructive pulmonary disease)     a. Ongoing tobacco abuse.  . Arthritis   . Neutropenia     a. Dx 10/2013 on labs - instructed to f/u PCP.  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   . Ischemic cardiomyopathy     a. previously EF 30% in 2011. b. 10/2013: EF 45-50% by echo.  . Alcoholism     a. Remote alcoholism  . Cleft palate      a.Chronic cleft palate from a traumatic injury as a child.  [name] Ulcer of right foot   . Chronic kidney disease   . Sinusitis   . Myocardial infarction   . Shortness of breath dyspnea   . History of hiatal hernia   . Pneumonia     "a couple times; years ago' (05/03/2015)  . Type II diabetes mellitus    Past Surgical History  Procedure Laterality Date  . Coronary artery bypass graft  June 2011    X3  . Inguinal hernia repair Right   . Cholecystectomy      3-4 years ago  . Appendectomy  ~ 1962  . Cataract extraction w/ intraocular lens implant Right   . Colonoscopy  05/12/2002    Dr. [name]- diverticulosis, anal papilla, internal hemorrhoids, rectal polyps  . [name] dilation  04/16/2012    Schatzki's ring-status post dilation and disruption/ as described above. Grade 1 esophageal varices. Small hiatal hernia.  [name] dilation  04/16/2012    Procedure: [name] DILATION;  Surgeon: Daneil Dolin, MD;  [facility];  Service: Endoscopy;  Laterality: N/A;  . Colonoscopy  08/10/2012    Procedure: COLONOSCOPY;  Surgeon: Daneil Dolin, MD;  Location: [facility];  Service: Endoscopy;  Laterality: N/A;  9:45 needs 30 mins extra /have Glucagon on hand  . Left heart catheterization with coronary/graft angiogram N/A 10/29/2013    Procedure: LEFT HEART CATHETERIZATION WITH [name];  Surgeon: Burnell Blanks, MD;  Location: [facility] CATH LAB;  Service: Cardiovascular;  Laterality: N/A;  . Abdominal aortagram N/A 09/09/2014    Procedure: ABDOMINAL Maxcine Ham;  Surgeon: Elam Dutch, MD;  Location: [facility] CATH LAB;  Service: Cardiovascular;  Laterality: N/A;  . Atherectomy Bilateral 09/23/2014    Procedure: ATHERECTOMY;  Surgeon: Elam Dutch, MD;  Location: [facility] CATH LAB;  Service: Cardiovascular;  Laterality: Bilateral;  iliacs  . Amputation Left 12/05/2014    Procedure: AMPUTATION ABOVE KNEE;  Surgeon: Elam Dutch, MD;  Location: [facility];  Service: Vascular;   Laterality: Left;  [name] Tympanoplasty Right   . 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 Esophagogastroduodenoscopy endoscopy  2003    Dr. Braulio Bosch: erosive reflux esophagitis , PUD  . Colonoscopy  05/2002    hyperplastic polyp, diverticulosis, anal papilla and internal hemorrhoids r  . Amputation Right 05/03/2015    Procedure: RIGHT ABOVE KNEE AMPUTATION;  Surgeon: Elam Dutch, MD;  Location: [facility] OR;  Service: Vascular;  Laterality: Right;   Family History  Problem Relation Age of Onset  . Coronary artery disease Mother   . Coronary artery disease Father   . Diabetes Sister   . Suicidality Brother   . Cirrhosis Brother   . Colon cancer Neg Hx    History  Substance Use Topics  . Smoking status: Current Every Day Smoker -- 0.50 packs/day for 65 years    Types: Cigarettes  . Smokeless tobacco: Former Systems developer    Types: Snuff     Comment: 05/03/2015 "dipped snuff years and years ago"  . Alcohol Use: No     Comment: Quit drinking in 1985    Review of Systems  Respiratory: Positive for shortness of breath.   Cardiovascular:       Low hemoglobin  Gastrointestinal: Negative for vomiting and blood in stool.  Genitourinary: Negative for hematuria.  Neurological: Positive for headaches.   Allergies  Lovaza; Lyrica; Zithromax; and Levaquin  Home Medications   Prior to Admission medications   Medication Sig Start Date End Date Taking? Authorizing Provider  albuterol-ipratropium (COMBIVENT) 18-103 MCG/ACT inhaler Inhale 1-2 puffs into the lungs every 6 (six) hours as needed for wheezing or shortness of breath. 11/02/13   Dayna N Dunn, PA-C  Amino Acids-Protein Hydrolys (FEEDING SUPPLEMENT, PRO-STAT SUGAR FREE 64,) LIQD Take 30 mLs by mouth 3 (three) times daily with meals. Patient not taking: Reported on 04/27/2015 12/09/14   Albertine Patricia, MD  aspirin 325 MG tablet Take 325 mg by mouth daily.    Historical Provider, MD  atorvastatin (LIPITOR) 40 MG tablet Take 40 mg by mouth  daily.    Historical Provider, MD  citalopram (CELEXA) 20 MG tablet Take 20 mg by mouth daily.    Historical Provider, MD  doxycycline (VIBRA-TABS) 100 MG tablet Take 1 tablet (100 mg total) by mouth every 12 (twelve) hours. 02/12/15   Velvet Bathe, MD  fenofibrate 160 MG tablet Take 160 mg by mouth daily with breakfast.     Historical Provider, MD  furosemide (LASIX) 20 MG tablet Take 40 mg by mouth daily.    Historical Provider, MD  HYDROcodone-acetaminophen (NORCO/VICODIN) 5-325 MG per tablet Take 1 tablet by mouth 4 (four) times daily as needed for moderate pain. 05/05/15   Hulen Shouts  Rhyne, PA-C  insulin aspart (NOVOLOG) 100 UNIT/ML injection Inject 0-15 Units into the skin 3 (three) times daily with meals. Patient not taking: Reported on 04/27/2015 12/09/14   Silver Huguenin Elgergawy, MD  insulin glargine (LANTUS) 100 UNIT/ML injection Inject 0.15 mLs (15 Units total) into the skin daily. Patient taking differently: Inject 10-30 Units into the skin daily as needed (for elevated blood sugar levels).  12/09/14   Albertine Patricia, MD  levalbuterol Penne Lash) 1.25 MG/3ML nebulizer solution Take 0.63 mg by nebulization every 4 (four) hours as needed for wheezing or shortness of breath. 07/14/14   Shanker Kristeen Mans, MD  lisinopril (PRINIVIL,ZESTRIL) 20 MG tablet Take 20 mg by mouth daily.     Historical Provider, MD  loratadine (CLARITIN) 10 MG tablet Take 10 mg by mouth daily.    Historical Provider, MD  metFORMIN (GLUCOPHAGE) 1000 MG tablet Take 1,000 mg by mouth 2 (two) times daily with a meal.     Historical Provider, MD  metolazone (ZAROXOLYN) 2.5 MG tablet TAKE ONE (1) TABLET EACH DAY Patient not taking: Reported on 04/27/2015 10/03/14   Lendon Colonel, NP  metoprolol tartrate (LOPRESSOR) 25 MG tablet Take 25 mg by mouth 2 (two) times daily.     Historical Provider, MD  nitroGLYCERIN (NITROSTAT) 0.4 MG SL tablet Place 0.4 mg under the tongue every 5 (five) minutes x 3 doses as needed for chest pain.      Historical Provider, MD  OXYGEN Inhale 3 L into the lungs daily as needed (shortess of breath).    Historical Provider, MD  Pancrelipase, Lip-Prot-Amyl, (CREON) 24000 UNITS CPEP Take 48,000-72,000 Units by mouth See admin instructions. Take 72000 units 3 times daily with all meals and take 48000 units with snacks    Historical Provider, MD  potassium chloride SA (K-DUR,KLOR-CON) 20 MEQ tablet Take 1 tablet (20 mEq total) by mouth 2 (two) times daily. 05/05/15 05/07/15  Samantha J Rhyne, PA-C  SYMBICORT 160-4.5 MCG/ACT inhaler Inhale 1-2 puffs into the lungs 2 (two) times daily as needed (shorntess of breath).  07/04/12   Historical Provider, MD  tamsulosin (FLOMAX) 0.4 MG CAPS capsule Take 0.4 mg by mouth at bedtime.    Historical Provider, MD  traZODone (DESYREL) 50 MG tablet Take 50 mg by mouth at bedtime as needed for sleep.     Historical Provider, MD   BP 120/54 mmHg  Pulse 105  Temp(Src) 98.7 F (37.1 C) (Oral)  Resp 26  SpO2 89%  Physical Exam  Constitutional: He is oriented to person, place, and time. He appears well-developed and well-nourished. He is cooperative.  Alert and appropriate  HENT:  Head: Normocephalic and atraumatic.  Eyes: EOM are normal. Pupils are equal, round, and reactive to light.  Neck: Normal range of motion. Neck supple.  Cardiovascular: Normal rate, regular rhythm and normal heart sounds.   Pulmonary/Chest: Effort normal and breath sounds normal.  Abdominal: Soft. There is no tenderness.  Musculoskeletal: Normal range of motion.  Bilateral above knee amputations, right with sutures still intact; wounds clean, dry and intact, no discharge  Neurological: He is alert and oriented to person, place, and time.  Skin: Skin is warm and dry. There is pallor.  Psychiatric: He has a normal mood and affect. His behavior is normal.  Nursing note and vitals reviewed.   ED Course  Procedures (including critical care time) DIAGNOSTIC STUDIES: Oxygen Saturation is 89% on  RA, low by my interpretation.    COORDINATION OF CARE: 10:37 PM-Discussed treatment  plan which includes labs and fluidswith pt and family at bedside and pt and family agreed to plan.   Labs Review Labs Reviewed  POC OCCULT BLOOD, ED - Abnormal; Notable for the following:    Fecal Occult Bld POSITIVE (*)    All other components within normal limits  CULTURE, BLOOD (ROUTINE X 2)  CULTURE, BLOOD (ROUTINE X 2)  COMPREHENSIVE METABOLIC PANEL  CBC WITH DIFFERENTIAL/PLATELET  URINALYSIS, ROUTINE W REFLEX MICROSCOPIC (NOT AT [facility])  TROPONIN I  BRAIN NATRIURETIC PEPTIDE  I-STAT CG4 LACTIC ACID, ED  TYPE AND SCREEN    Imaging Review Dg Chest Portable 1 View  05/19/2015   CLINICAL DATA:  74 year old male with shortness of breath  EXAM: PORTABLE CHEST - 1 VIEW  COMPARISON:  Chest radiograph dated 05/14/2015  FINDINGS: Single-view of the chest demonstrate emphysematous changes of the lungs. Bilateral lower lung field airspace opacities, new from prior studies and concerning for pneumonia. Clinical correlation and follow-up recommended. A small left pleural effusion may be present. Stable cardiomegaly. Median sternotomy wires. The osseous structures are grossly unremarkable.  IMPRESSION: Bilateral lower lung field airspace opacities concerning for pneumonia. Clinical correlation and follow-up resolution recommended.   Electronically Signed   By: [name] M.D.   On: 05/19/2015 23:07     EKG Interpretation   Date/Time:  Friday May 19 2015 22:40:34 EDT Ventricular Rate:  101 PR Interval:  155 QRS Duration: 143 QT Interval:  376 QTC Calculation: 487 R Axis:   -42 Text Interpretation:  Sinus tachycardia Left bundle branch block Baseline  wander in lead(s) V6 Confirmed by Alvino Chapel  MD, Ovid Curd [phone]) on  05/19/2015 10:42:30 PM      MDM   Final diagnoses:  HCAP (healthcare-associated pneumonia)  Anemia, unspecified anemia type  Hypoxia   patient presented with shortness of breath.  Lab work at the nursing home showed a hemoglobin of 6.8. Had had recent right-sided above-the-knee amputation. Hemoglobin was 8 at that time. Found a fever here. X-ray shows pneumonia. Patient is hypoxic. Will start BiPAP. Will admit to internal medicine. I personally performed the services described in this documentation, which was scribed in my presence. The recorded information has been reviewed and is accurate.     Davonna Belling, MD 05/19/15 [phone]

## 2015-05-19 NOTE — ED Notes (Signed)
Pt is from the [facility], in here by ems for low hemoglobin (6.8)   Pt denies complaints

## 2015-05-20 ENCOUNTER — Inpatient Hospital Stay (HOSPITAL_COMMUNITY): Payer: Medicare Other

## 2015-05-20 ENCOUNTER — Encounter (HOSPITAL_COMMUNITY): Payer: Self-pay | Admitting: Internal Medicine

## 2015-05-20 DIAGNOSIS — Z8249 Family history of ischemic heart disease and other diseases of the circulatory system: Secondary | ICD-10-CM | POA: Diagnosis not present

## 2015-05-20 DIAGNOSIS — Z833 Family history of diabetes mellitus: Secondary | ICD-10-CM | POA: Diagnosis not present

## 2015-05-20 DIAGNOSIS — J189 Pneumonia, unspecified organism: Secondary | ICD-10-CM | POA: Diagnosis present

## 2015-05-20 DIAGNOSIS — Z9981 Dependence on supplemental oxygen: Secondary | ICD-10-CM | POA: Diagnosis not present

## 2015-05-20 DIAGNOSIS — Z89612 Acquired absence of left leg above knee: Secondary | ICD-10-CM | POA: Diagnosis not present

## 2015-05-20 DIAGNOSIS — D649 Anemia, unspecified: Secondary | ICD-10-CM | POA: Insufficient documentation

## 2015-05-20 DIAGNOSIS — R4182 Altered mental status, unspecified: Secondary | ICD-10-CM

## 2015-05-20 DIAGNOSIS — I252 Old myocardial infarction: Secondary | ICD-10-CM | POA: Diagnosis not present

## 2015-05-20 DIAGNOSIS — R0600 Dyspnea, unspecified: Secondary | ICD-10-CM

## 2015-05-20 DIAGNOSIS — I5022 Chronic systolic (congestive) heart failure: Secondary | ICD-10-CM | POA: Diagnosis not present

## 2015-05-20 DIAGNOSIS — N189 Chronic kidney disease, unspecified: Secondary | ICD-10-CM | POA: Diagnosis present

## 2015-05-20 DIAGNOSIS — F17220 Nicotine dependence, chewing tobacco, uncomplicated: Secondary | ICD-10-CM | POA: Diagnosis present

## 2015-05-20 DIAGNOSIS — J9621 Acute and chronic respiratory failure with hypoxia: Secondary | ICD-10-CM | POA: Diagnosis not present

## 2015-05-20 DIAGNOSIS — K219 Gastro-esophageal reflux disease without esophagitis: Secondary | ICD-10-CM | POA: Diagnosis present

## 2015-05-20 DIAGNOSIS — F17210 Nicotine dependence, cigarettes, uncomplicated: Secondary | ICD-10-CM | POA: Diagnosis present

## 2015-05-20 DIAGNOSIS — E785 Hyperlipidemia, unspecified: Secondary | ICD-10-CM | POA: Diagnosis present

## 2015-05-20 DIAGNOSIS — J9620 Acute and chronic respiratory failure, unspecified whether with hypoxia or hypercapnia: Secondary | ICD-10-CM | POA: Diagnosis present

## 2015-05-20 DIAGNOSIS — Y95 Nosocomial condition: Secondary | ICD-10-CM | POA: Diagnosis present

## 2015-05-20 DIAGNOSIS — J9602 Acute respiratory failure with hypercapnia: Secondary | ICD-10-CM | POA: Insufficient documentation

## 2015-05-20 DIAGNOSIS — Z951 Presence of aortocoronary bypass graft: Secondary | ICD-10-CM | POA: Diagnosis not present

## 2015-05-20 DIAGNOSIS — E119 Type 2 diabetes mellitus without complications: Secondary | ICD-10-CM | POA: Diagnosis present

## 2015-05-20 DIAGNOSIS — I129 Hypertensive chronic kidney disease with stage 1 through stage 4 chronic kidney disease, or unspecified chronic kidney disease: Secondary | ICD-10-CM | POA: Diagnosis present

## 2015-05-20 DIAGNOSIS — J449 Chronic obstructive pulmonary disease, unspecified: Secondary | ICD-10-CM | POA: Diagnosis present

## 2015-05-20 DIAGNOSIS — A419 Sepsis, unspecified organism: Secondary | ICD-10-CM | POA: Diagnosis present

## 2015-05-20 LAB — BLOOD GAS, ARTERIAL
Acid-Base Excess: 10.7 mmol/L — ABNORMAL HIGH (ref 0.0–2.0)
Acid-Base Excess: 10 mmol/L — ABNORMAL HIGH (ref 0.0–2.0)
BICARBONATE: 35.1 meq/L — AB (ref 20.0–24.0)
Bicarbonate: 34.2 mEq/L — ABNORMAL HIGH (ref 20.0–24.0)
DRAWN BY: 213101
Delivery systems: POSITIVE
Drawn by: 21310
Expiratory PAP: 8
FIO2: 100 %
INSPIRATORY PAP: 16
O2 CONTENT: 4 L/min
O2 Saturation: 82.3 %
O2 Saturation: 97.1 %
PCO2 ART: 49.7 mmHg — AB (ref 35.0–45.0)
PCO2 ART: 47.5 mmHg — AB (ref 35.0–45.0)
PH ART: 7.47 — AB (ref 7.350–7.450)
PO2 ART: 94.5 mmHg (ref 80.0–100.0)
Patient temperature: 37
Patient temperature: 37
TCO2: 31.2 mmol/L (ref 0–100)
TCO2: 30.6 mmol/L (ref 0–100)
pH, Arterial: 7.463 — ABNORMAL HIGH (ref 7.350–7.450)
pO2, Arterial: 49 mmHg — ABNORMAL LOW (ref 80.0–100.0)

## 2015-05-20 LAB — COMPREHENSIVE METABOLIC PANEL
ALT: 9 U/L — AB (ref 17–63)
ALT: 8 U/L — ABNORMAL LOW (ref 17–63)
ANION GAP: 13 (ref 5–15)
AST: 14 U/L — AB (ref 15–41)
AST: 17 U/L (ref 15–41)
Albumin: 1.9 g/dL — ABNORMAL LOW (ref 3.5–5.0)
Albumin: 1.7 g/dL — ABNORMAL LOW (ref 3.5–5.0)
Alkaline Phosphatase: 71 U/L (ref 38–126)
Alkaline Phosphatase: 65 U/L (ref 38–126)
Anion gap: 10 (ref 5–15)
BILIRUBIN TOTAL: 0.4 mg/dL (ref 0.3–1.2)
BUN: 44 mg/dL — AB (ref 6–20)
BUN: 42 mg/dL — AB (ref 6–20)
CHLORIDE: 84 mmol/L — AB (ref 101–111)
CO2: 32 mmol/L (ref 22–32)
CO2: 33 mmol/L — ABNORMAL HIGH (ref 22–32)
CREATININE: 0.88 mg/dL (ref 0.61–1.24)
CREATININE: 0.87 mg/dL (ref 0.61–1.24)
Calcium: 7.6 mg/dL — ABNORMAL LOW (ref 8.9–10.3)
Calcium: 7.4 mg/dL — ABNORMAL LOW (ref 8.9–10.3)
Chloride: 87 mmol/L — ABNORMAL LOW (ref 101–111)
GFR calc Af Amer: >60 mL/min (ref >60)
GFR calc non Af Amer: >60 mL/min (ref >60)
GFR calc non Af Amer: >60 mL/min (ref >60)
GLUCOSE: 204 mg/dL — AB (ref 65–99)
Glucose, Bld: 199 mg/dL — ABNORMAL HIGH (ref 65–99)
POTASSIUM: 3.2 mmol/L — AB (ref 3.5–5.1)
Potassium: 3.6 mmol/L (ref 3.5–5.1)
Sodium: 129 mmol/L — ABNORMAL LOW (ref 135–145)
Sodium: 130 mmol/L — ABNORMAL LOW (ref 135–145)
TOTAL PROTEIN: 6.1 g/dL — AB (ref 6.5–8.1)
TOTAL PROTEIN: 5.5 g/dL — AB (ref 6.5–8.1)
Total Bilirubin: 0.5 mg/dL (ref 0.3–1.2)

## 2015-05-20 LAB — URINALYSIS, ROUTINE W REFLEX MICROSCOPIC
Bilirubin Urine: NEGATIVE
GLUCOSE, UA: NEGATIVE mg/dL
Hgb urine dipstick: NEGATIVE
KETONES UR: NEGATIVE mg/dL
Leukocytes, UA: NEGATIVE
Nitrite: NEGATIVE
PROTEIN: 30 mg/dL — AB
Specific Gravity, Urine: 1.015 (ref 1.005–1.030)
Urobilinogen, UA: 0.2 mg/dL (ref 0.0–1.0)
pH: 5.5 (ref 5.0–8.0)

## 2015-05-20 LAB — CBC WITH DIFFERENTIAL/PLATELET
BASOS ABS: 0 10*3/uL (ref 0.0–0.1)
Basophils Relative: 0 % (ref 0–1)
EOS ABS: 0 10*3/uL (ref 0.0–0.7)
Eosinophils Relative: 0 % (ref 0–5)
HCT: 20.4 % — ABNORMAL LOW (ref 39.0–52.0)
Hemoglobin: 6.6 g/dL — CL (ref 13.0–17.0)
LYMPHS ABS: 0.2 10*3/uL — AB (ref 0.7–4.0)
LYMPHS PCT: 2 % — AB (ref 12–46)
MCH: 28.3 pg (ref 26.0–34.0)
MCHC: 32.4 g/dL (ref 30.0–36.0)
MCV: 87.6 fL (ref 78.0–100.0)
Monocytes Absolute: 0.5 10*3/uL (ref 0.1–1.0)
Monocytes Relative: 4 % (ref 3–12)
Neutro Abs: 11.6 10*3/uL — ABNORMAL HIGH (ref 1.7–7.7)
Neutrophils Relative %: 94 % — ABNORMAL HIGH (ref 43–77)
PLATELETS: 534 10*3/uL — AB (ref 150–400)
RBC: 2.33 MIL/uL — AB (ref 4.22–5.81)
RDW: 15.6 % — ABNORMAL HIGH (ref 11.5–15.5)
WBC: 12.4 10*3/uL — AB (ref 4.0–10.5)

## 2015-05-20 LAB — GLUCOSE, CAPILLARY
GLUCOSE-CAPILLARY: 256 mg/dL — AB (ref 65–99)
Glucose-Capillary: 225 mg/dL — ABNORMAL HIGH (ref 65–99)
Glucose-Capillary: 220 mg/dL — ABNORMAL HIGH (ref 65–99)
Glucose-Capillary: 195 mg/dL — ABNORMAL HIGH (ref 65–99)
Glucose-Capillary: 214 mg/dL — ABNORMAL HIGH (ref 65–99)
Glucose-Capillary: 210 mg/dL — ABNORMAL HIGH (ref 65–99)

## 2015-05-20 LAB — CBC
HCT: 27.5 % — ABNORMAL LOW (ref 39.0–52.0)
HEMOGLOBIN: 9.6 g/dL — AB (ref 13.0–17.0)
MCH: 29.6 pg (ref 26.0–34.0)
MCHC: 34.9 g/dL (ref 30.0–36.0)
MCV: 84.9 fL (ref 78.0–100.0)
Platelets: 463 10*3/uL — ABNORMAL HIGH (ref 150–400)
RBC: 3.24 MIL/uL — ABNORMAL LOW (ref 4.22–5.81)
RDW: 15.7 % — ABNORMAL HIGH (ref 11.5–15.5)
WBC: 13 10*3/uL — ABNORMAL HIGH (ref 4.0–10.5)

## 2015-05-20 LAB — ABO/RH: ABO/RH(D): A POS

## 2015-05-20 LAB — TSH: TSH: 3.325 u[IU]/mL (ref 0.350–4.500)

## 2015-05-20 LAB — BRAIN NATRIURETIC PEPTIDE: B Natriuretic Peptide: 432 pg/mL — ABNORMAL HIGH (ref 0.0–100.0)

## 2015-05-20 LAB — CORTISOL: Cortisol, Plasma: 19.1 ug/dL

## 2015-05-20 LAB — TROPONIN I
TROPONIN I: 0.03 ng/mL (ref <0.031)
Troponin I: 0.04 ng/mL — ABNORMAL HIGH (ref <0.031)
Troponin I: 0.03 ng/mL (ref <0.031)
Troponin I: <0.03 ng/mL (ref <0.031)

## 2015-05-20 LAB — URINE MICROSCOPIC-ADD ON

## 2015-05-20 LAB — SODIUM, URINE, RANDOM: Sodium, Ur: 18 mmol/L

## 2015-05-20 LAB — MRSA PCR SCREENING: MRSA BY PCR: NEGATIVE

## 2015-05-20 LAB — STREP PNEUMONIAE URINARY ANTIGEN: STREP PNEUMO URINARY ANTIGEN: NEGATIVE

## 2015-05-20 LAB — PREPARE RBC (CROSSMATCH)

## 2015-05-20 MED ORDER — FUROSEMIDE 40 MG PO TABS
40.0000 mg | ORAL_TABLET | Freq: Every day | ORAL | Status: DC
  Administered 2015-05-21 – 2015-05-23 (×3): 40 mg via ORAL
  Filled 2015-05-20 (×3): qty 1

## 2015-05-20 MED ORDER — PANCRELIPASE (LIP-PROT-AMYL) 12000-38000 UNITS PO CPEP
48000.0000 [IU] | ORAL_CAPSULE | Freq: Three times a day (TID) | ORAL | Status: DC
  Administered 2015-05-20 – 2015-05-23 (×9): 48000 [IU] via ORAL
  Filled 2015-05-20 (×9): qty 4

## 2015-05-20 MED ORDER — METHYLPREDNISOLONE SODIUM SUCC 125 MG IJ SOLR
80.0000 mg | Freq: Once | INTRAMUSCULAR | Status: AC
  Administered 2015-05-20: 80 mg via INTRAVENOUS
  Filled 2015-05-20: qty 2

## 2015-05-20 MED ORDER — SODIUM CHLORIDE 0.9 % IV SOLN
INTRAVENOUS | Status: AC
  Administered 2015-05-20: 02:00:00 via INTRAVENOUS

## 2015-05-20 MED ORDER — LORAZEPAM 2 MG/ML IJ SOLN
0.5000 mg | Freq: Once | INTRAMUSCULAR | Status: AC
  Administered 2015-05-20: 0.5 mg via INTRAVENOUS

## 2015-05-20 MED ORDER — HYDROCODONE-ACETAMINOPHEN 5-325 MG PO TABS
1.0000 | ORAL_TABLET | Freq: Four times a day (QID) | ORAL | Status: DC | PRN
  Administered 2015-05-20 – 2015-05-22 (×6): 1 via ORAL
  Filled 2015-05-20 (×6): qty 1

## 2015-05-20 MED ORDER — IPRATROPIUM-ALBUTEROL 0.5-2.5 (3) MG/3ML IN SOLN
3.0000 mL | Freq: Four times a day (QID) | RESPIRATORY_TRACT | Status: DC
  Administered 2015-05-20 (×3): 3 mL via RESPIRATORY_TRACT
  Filled 2015-05-20 (×3): qty 3

## 2015-05-20 MED ORDER — LORAZEPAM 2 MG/ML IJ SOLN
INTRAMUSCULAR | Status: AC
  Filled 2015-05-20: qty 1

## 2015-05-20 MED ORDER — DOXYCYCLINE HYCLATE 100 MG PO TABS: 100.0000 mg | ORAL_TABLET | Freq: Two times a day (BID) | ORAL | Status: DC

## 2015-05-20 MED ORDER — INSULIN ASPART 100 UNIT/ML ~~LOC~~ SOLN
0.0000 [IU] | Freq: Every day | SUBCUTANEOUS | Status: DC
  Administered 2015-05-20: 3 [IU] via SUBCUTANEOUS
  Administered 2015-05-20: 2 [IU] via SUBCUTANEOUS
  Administered 2015-05-21: 3 [IU] via SUBCUTANEOUS
  Administered 2015-05-22: 4 [IU] via SUBCUTANEOUS

## 2015-05-20 MED ORDER — ASPIRIN 325 MG PO TABS: 325.0000 mg | ORAL_TABLET | Freq: Every day | ORAL | Status: DC

## 2015-05-20 MED ORDER — NITROGLYCERIN 0.4 MG SL SUBL: 0.4000 mg | SUBLINGUAL_TABLET | SUBLINGUAL | Status: DC | PRN

## 2015-05-20 MED ORDER — POTASSIUM CHLORIDE CRYS ER 20 MEQ PO TBCR
20.0000 meq | EXTENDED_RELEASE_TABLET | Freq: Two times a day (BID) | ORAL | Status: DC
  Administered 2015-05-20 – 2015-05-23 (×6): 20 meq via ORAL
  Filled 2015-05-20 (×5): qty 1
  Filled 2015-05-20: qty 2

## 2015-05-20 MED ORDER — PANCRELIPASE (LIP-PROT-AMYL) 12000-38000 UNITS PO CPEP: 48000.0000 [IU] | ORAL_CAPSULE | ORAL | Status: DC | PRN

## 2015-05-20 MED ORDER — TAMSULOSIN HCL 0.4 MG PO CAPS
0.4000 mg | ORAL_CAPSULE | Freq: Every day | ORAL | Status: DC
  Administered 2015-05-20 – 2015-05-22 (×3): 0.4 mg via ORAL
  Filled 2015-05-20 (×3): qty 1

## 2015-05-20 MED ORDER — FENOFIBRATE 160 MG PO TABS
160.0000 mg | ORAL_TABLET | Freq: Every day | ORAL | Status: DC
  Administered 2015-05-21 – 2015-05-23 (×3): 160 mg via ORAL
  Filled 2015-05-20 (×4): qty 1

## 2015-05-20 MED ORDER — IPRATROPIUM-ALBUTEROL 0.5-2.5 (3) MG/3ML IN SOLN
3.0000 mL | Freq: Four times a day (QID) | RESPIRATORY_TRACT | Status: DC
  Administered 2015-05-20 – 2015-05-23 (×9): 3 mL via RESPIRATORY_TRACT
  Filled 2015-05-20 (×10): qty 3

## 2015-05-20 MED ORDER — CITALOPRAM HYDROBROMIDE 20 MG PO TABS
20.0000 mg | ORAL_TABLET | Freq: Every day | ORAL | Status: DC
  Administered 2015-05-21 – 2015-05-23 (×3): 20 mg via ORAL
  Filled 2015-05-20 (×3): qty 1

## 2015-05-20 MED ORDER — LISINOPRIL 10 MG PO TABS
20.0000 mg | ORAL_TABLET | Freq: Every day | ORAL | Status: DC
  Administered 2015-05-21 – 2015-05-23 (×3): 20 mg via ORAL
  Filled 2015-05-20 (×3): qty 2

## 2015-05-20 MED ORDER — METOPROLOL TARTRATE 25 MG PO TABS
25.0000 mg | ORAL_TABLET | Freq: Two times a day (BID) | ORAL | Status: DC
  Administered 2015-05-20 – 2015-05-23 (×6): 25 mg via ORAL
  Filled 2015-05-20 (×6): qty 1

## 2015-05-20 MED ORDER — LORATADINE 10 MG PO TABS
10.0000 mg | ORAL_TABLET | Freq: Every day | ORAL | Status: DC
  Administered 2015-05-21 – 2015-05-23 (×3): 10 mg via ORAL
  Filled 2015-05-20 (×3): qty 1

## 2015-05-20 MED ORDER — INSULIN GLARGINE 100 UNIT/ML ~~LOC~~ SOLN
10.0000 [IU] | Freq: Every day | SUBCUTANEOUS | Status: DC
  Administered 2015-05-20 – 2015-05-22 (×3): 10 [IU] via SUBCUTANEOUS
  Filled 2015-05-20 (×5): qty 0.1

## 2015-05-20 MED ORDER — ENOXAPARIN SODIUM 40 MG/0.4ML ~~LOC~~ SOLN
40.0000 mg | SUBCUTANEOUS | Status: DC
  Administered 2015-05-20: 40 mg via SUBCUTANEOUS
  Filled 2015-05-20: qty 0.4

## 2015-05-20 MED ORDER — VANCOMYCIN HCL IN DEXTROSE 750-5 MG/150ML-% IV SOLN
750.0000 mg | Freq: Two times a day (BID) | INTRAVENOUS | Status: DC
  Administered 2015-05-20 – 2015-05-23 (×7): 750 mg via INTRAVENOUS
  Filled 2015-05-20 (×9): qty 150

## 2015-05-20 MED ORDER — INSULIN ASPART 100 UNIT/ML ~~LOC~~ SOLN
0.0000 [IU] | Freq: Three times a day (TID) | SUBCUTANEOUS | Status: DC
  Administered 2015-05-20 (×2): 3 [IU] via SUBCUTANEOUS
  Administered 2015-05-20: 2 [IU] via SUBCUTANEOUS
  Administered 2015-05-21: 5 [IU] via SUBCUTANEOUS
  Administered 2015-05-21 (×2): 3 [IU] via SUBCUTANEOUS
  Administered 2015-05-22 (×2): 2 [IU] via SUBCUTANEOUS
  Administered 2015-05-22: 5 [IU] via SUBCUTANEOUS
  Administered 2015-05-23: 1 [IU] via SUBCUTANEOUS

## 2015-05-20 MED ORDER — PRO-STAT SUGAR FREE PO LIQD
30.0000 mL | Freq: Three times a day (TID) | ORAL | Status: DC
  Administered 2015-05-20 – 2015-05-23 (×10): 30 mL via ORAL
  Filled 2015-05-20 (×9): qty 30

## 2015-05-20 MED ORDER — TRAZODONE HCL 50 MG PO TABS
50.0000 mg | ORAL_TABLET | Freq: Every evening | ORAL | Status: DC | PRN
  Administered 2015-05-20 – 2015-05-21 (×2): 50 mg via ORAL
  Filled 2015-05-20 (×2): qty 1

## 2015-05-20 MED ORDER — ATORVASTATIN CALCIUM 40 MG PO TABS
40.0000 mg | ORAL_TABLET | Freq: Every day | ORAL | Status: DC
  Administered 2015-05-21 – 2015-05-23 (×3): 40 mg via ORAL
  Filled 2015-05-20 (×3): qty 1

## 2015-05-20 MED ORDER — PIPERACILLIN-TAZOBACTAM 3.375 G IVPB
3.3750 g | Freq: Three times a day (TID) | INTRAVENOUS | Status: DC
  Administered 2015-05-20 – 2015-05-23 (×10): 3.375 g via INTRAVENOUS
  Filled 2015-05-20 (×13): qty 50

## 2015-05-20 MED ORDER — ALBUTEROL SULFATE (2.5 MG/3ML) 0.083% IN NEBU
2.5000 mg | INHALATION_SOLUTION | RESPIRATORY_TRACT | Status: DC | PRN
  Administered 2015-05-20: 2.5 mg via RESPIRATORY_TRACT
  Filled 2015-05-20: qty 3

## 2015-05-20 MED ORDER — BUDESONIDE-FORMOTEROL FUMARATE 160-4.5 MCG/ACT IN AERO
1.0000 | INHALATION_SPRAY | Freq: Two times a day (BID) | RESPIRATORY_TRACT | Status: DC | PRN
  Administered 2015-05-20 – 2015-05-23 (×6): 2 via RESPIRATORY_TRACT
  Filled 2015-05-20: qty 6

## 2015-05-20 MED ORDER — SODIUM CHLORIDE 0.9 % IV SOLN
Freq: Once | INTRAVENOUS | Status: AC
  Administered 2015-05-20: 07:00:00 via INTRAVENOUS

## 2015-05-20 NOTE — Progress Notes (Signed)
ANTIBIOTIC CONSULT NOTE - INITIAL  Pharmacy Consult for Vancomycin and Zosyn Indication: HCAP   Allergies  Allergen Reactions  . Lovaza [Omega-3-Acid Ethyl Esters] Itching  . Lyrica [Pregabalin] Hives  . Zithromax [Azithromycin]     rash  . Levaquin [Levofloxacin] Hives   Patient Measurements: Height: 5\' 10"  (177.8 cm) Weight: 120 lb 9.5 oz (54.7 kg) IBW/kg (Calculated) : 73  Vital Signs: Temp: 97.2 F (36.2 C) (07/16 0752) Temp Source: Axillary (07/16 0752) BP: 127/76 mmHg (07/16 0730) Pulse Rate: 81 (07/16 0730) Intake/Output from previous day: 07/15 0701 - 07/16 0700 In: -  Out: 600 [Urine:600] Intake/Output from this shift:    Labs:  Recent Labs  05/19/15 2310 05/20/15 0450  WBC 14.0* 12.4*  HGB 7.6* 6.6*  PLT 530* 534*  CREATININE 0.88 0.87   Estimated Creatinine Clearance: 59.4 mL/min (by C-G formula based on Cr of 0.87). No results for input(s): VANCOTROUGH, VANCOPEAK, VANCORANDOM, GENTTROUGH, GENTPEAK, GENTRANDOM, TOBRATROUGH, TOBRAPEAK, TOBRARND, AMIKACINPEAK, AMIKACINTROU, AMIKACIN in the last 72 hours.   Microbiology: Recent Results (from the past 720 hour(s))  Surgical pcr screen     Status: None   Collection Time: 04/27/15  9:46 AM  Result Value Ref Range Status   MRSA, PCR NEGATIVE NEGATIVE Final   Staphylococcus aureus NEGATIVE NEGATIVE Final    Comment:        The Xpert SA Assay (FDA approved for NASAL specimens in patients over 41 years of age), is one component of a comprehensive surveillance program.  Test performance has been validated by [facility] for patients greater than or equal to 7 year old. It is not intended to diagnose infection nor to guide or monitor treatment.   Culture, blood (routine x 2)     Status: None (Preliminary result)   Collection Time: 05/19/15 11:10 PM  Result Value Ref Range Status   Specimen Description BLOOD RIGHT ARM  Final   Special Requests   Final    BOTTLES DRAWN AEROBIC AND ANAEROBIC [place]  DRAWN BY RN   Culture PENDING  Incomplete   Report Status PENDING  Incomplete  Culture, blood (routine x 2)     Status: None (Preliminary result)   Collection Time: 05/19/15 11:33 PM  Result Value Ref Range Status   Specimen Description RIGHT ANTECUBITAL  Final   Special Requests BOTTLES DRAWN AEROBIC AND ANAEROBIC 6CC  Final   Culture PENDING  Incomplete   Report Status PENDING  Incomplete   Medical History: Past Medical History  Diagnosis Date  . Hypertension   . Pancreatitis   . Neuropathy   . Hyperlipemia   . Peripheral vascular disease     a. 10/2013: severely abnormal ABIs, for OP evaluation.  [name] GERD (gastroesophageal reflux disease)   . PUD (peptic ulcer disease)     diagnosed via EGD by Dr. Braulio Bosch at [place]  . Diverticulosis   . Hemorrhoids   . Coronary artery disease      a. BMS to LAD 1999. b. HSRA to LAD and stent to RCA 2007. c. NSTEMI/CABG x 3 in 04/2010. d. NSTEMI 10/2013 - secondary to demand ischemia in setting of COPD exacerbation with severe underlying CAD - cath with 3/3 patent grafts, for medical therapy.   [name] COPD (chronic obstructive pulmonary disease)     a. Ongoing tobacco abuse.  . Arthritis   . Neutropenia     a. Dx 10/2013 on labs - instructed to f/u PCP.  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   . Ischemic cardiomyopathy     a. previously EF 30% in 2011. b. 10/2013: EF 45-50% by echo.  . Alcoholism     a. Remote alcoholism  . 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  [name] Ulcer of right foot   . Chronic kidney disease   . Sinusitis   . Myocardial infarction   . Shortness of breath dyspnea   . History of hiatal hernia   . Pneumonia     "a couple times; years ago' (05/03/2015)  . Type II diabetes mellitus    Anti-infectives    Start     Dose/Rate Route Frequency Ordered Stop   05/20/15 1000  vancomycin (VANCOCIN) IVPB 750  mg/150 ml premix     750 mg 150 mL/hr over 60 Minutes Intravenous Every 12 hours 05/20/15 0817     05/20/15 0900  piperacillin-tazobactam (ZOSYN) IVPB 3.375 g     3.375 g 12.5 mL/hr over 240 Minutes Intravenous Every 8 hours 05/20/15 0820     05/20/15 0130  doxycycline (VIBRA-TABS) tablet 100 mg     100 mg Oral Every 12 hours 05/20/15 0119     05/19/15 2345  piperacillin-tazobactam (ZOSYN) IVPB 3.375 g     3.375 g 100 mL/hr over 30 Minutes Intravenous  Once 05/19/15 2340 05/20/15 0210   05/19/15 2345  vancomycin (VANCOCIN) IVPB 1000 mg/200 mL premix     1,000 mg 200 mL/hr over 60 Minutes Intravenous  Once 05/19/15 2340 05/20/15 0125     Assessment: 74yo male with multiple medical issues.  Pt admitted with pneumonia and anemia.  Asked to initiate Vancomycin and Zosyn.  SCr is at baseline.    Goal of Therapy:  Vancomycin trough level 15-20 mcg/ml  Plan:  Vancomycin 750mg  IV q12hrs Check trough at steady state Zosyn 3.375gm IV q8h, each dose over 4 hrs Continue Doxycycline per MD Deescalate ABX when improved / appropriate Monitor labs, renal fxn, and c/s  Hart Robinsons A 05/20/2015,8:24 AM

## 2015-05-20 NOTE — H&P (Signed)
DOMONICK SITTNER is an 74 y.o. male.    Particia Nearing (pcp)  Chief Complaint: dyspnea HPI: 74 yo male with htn, hyperlipidemia, cad, Copd on home o2, apparently had difficulty with breathing and also has been talking crazy for the past 2 days.  Slight fever. + cough, nonproductive.  Denies cp, palp, orthopnea, pnd, lower ext edema.  Pt was brought to ED and found to have anemia, pneumonia, and mild trop leak, possibly due to demand ischemia.  Pt will be admitted for Hcap.   Past Medical History  Diagnosis Date  . Hypertension   . Pancreatitis   . Neuropathy   . Hyperlipemia   . Peripheral vascular disease     a. 10/2013: severely abnormal ABIs, for OP evaluation.  [name] GERD (gastroesophageal reflux disease)   . PUD (peptic ulcer disease)     diagnosed via EGD by Dr. Braulio Bosch at [place]  . Diverticulosis   . Hemorrhoids   . Coronary artery disease      a. BMS to LAD 1999. b. HSRA to LAD and stent to RCA 2007. c. NSTEMI/CABG x 3 in 04/2010. d. NSTEMI 10/2013 - secondary to demand ischemia in setting of COPD exacerbation with severe underlying CAD - cath with 3/3 patent grafts, for medical therapy.   [name] COPD (chronic obstructive pulmonary disease)     a. Ongoing tobacco abuse.  . Arthritis   . Neutropenia     a. Dx 10/2013 on labs - instructed to f/u PCP.  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   . Ischemic cardiomyopathy     a. previously EF 30% in 2011. b. 10/2013: EF 45-50% by echo.  . Alcoholism     a. Remote alcoholism  . 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  [name] Ulcer of right foot   . Chronic kidney disease   . Sinusitis   . Myocardial infarction   . Shortness of breath dyspnea   . History of hiatal hernia   . Pneumonia     "a couple times; years ago' (05/03/2015)  . Type II diabetes mellitus     Past Surgical History  Procedure Laterality Date  . Coronary  artery bypass graft  June 2011    X3  . Inguinal hernia repair Right   . Cholecystectomy      3-4 years ago  . Appendectomy  ~ 1962  . Cataract extraction w/ intraocular lens implant Right   . Colonoscopy  05/12/2002    Dr. [name]- diverticulosis, anal papilla, internal hemorrhoids, rectal polyps  . [name] dilation  04/16/2012    Schatzki's ring-status post dilation and disruption/ as described above. Grade 1 esophageal varices. Small hiatal hernia.  [name] dilation  04/16/2012    Procedure: [name] DILATION;  Surgeon: Daneil Dolin, MD;  [facility];  Service: Endoscopy;  Laterality: N/A;  . Colonoscopy  08/10/2012    Procedure: COLONOSCOPY;  Surgeon: Daneil Dolin, MD;  Location: [facility];  Service: Endoscopy;  Laterality: N/A;  9:45 needs 30 mins extra /have Glucagon on hand  . Left heart catheterization with coronary/graft angiogram N/A 10/29/2013    Procedure: LEFT HEART CATHETERIZATION WITH [name];  Surgeon: Burnell Blanks, MD;  Location: [facility] CATH LAB;  Service: Cardiovascular;  Laterality: N/A;  . Abdominal aortagram N/A 09/09/2014    Procedure: ABDOMINAL Maxcine Ham;  Surgeon: Juanda Crumble  Antony Blackbird, MD;  Location: [place] CATH LAB;  Service: Cardiovascular;  Laterality: N/A;  . Atherectomy Bilateral 09/23/2014    Procedure: ATHERECTOMY;  Surgeon: Elam Dutch, MD;  Location: [facility] CATH LAB;  Service: Cardiovascular;  Laterality: Bilateral;  iliacs  . Amputation Left 12/05/2014    Procedure: AMPUTATION ABOVE KNEE;  Surgeon: Elam Dutch, MD;  Location: [place];  Service: Vascular;  Laterality: Left;  [name] Tympanoplasty Right   . 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 Esophagogastroduodenoscopy endoscopy  2003    Dr. Braulio Bosch: erosive reflux esophagitis , PUD  . Colonoscopy  05/2002    hyperplastic polyp, diverticulosis, anal papilla and internal hemorrhoids r  . Amputation Right 05/03/2015    Procedure: RIGHT ABOVE KNEE AMPUTATION;  Surgeon:  Elam Dutch, MD;  Location: [facility] OR;  Service: Vascular;  Laterality: Right;    Family History  Problem Relation Age of Onset  . Coronary artery disease Mother   . Coronary artery disease Father   . Diabetes Sister   . Suicidality Brother   . Cirrhosis Brother   . Colon cancer Neg Hx    Social History:  reports that he has been smoking Cigarettes.  He has a 32.5 pack-year smoking history. He has quit using smokeless tobacco. His smokeless tobacco use included Snuff. He reports that he does not drink alcohol or use illicit drugs.  Allergies:  Allergies  Allergen Reactions  . Lovaza [Omega-3-Acid Ethyl Esters] Itching  . Lyrica [Pregabalin] Hives  . Zithromax [Azithromycin]     rash  . Levaquin [Levofloxacin] Hives   Medications reviewed   Results for orders placed or performed during the hospital encounter of 05/19/15 (from the past 48 hour(s))  Comprehensive metabolic panel     Status: Abnormal   Collection Time: 05/19/15 11:10 PM  Result Value Ref Range   Sodium 129 (L) 135 - 145 mmol/L   Potassium 3.6 3.5 - 5.1 mmol/L   Chloride 84 (L) 101 - 111 mmol/L   CO2 32 22 - 32 mmol/L   Glucose, Bld 204 (H) 65 - 99 mg/dL   BUN 44 (H) 6 - 20 mg/dL   Creatinine, Ser 0.88 0.61 - 1.24 mg/dL   Calcium 7.6 (L) 8.9 - 10.3 mg/dL   Total Protein 6.1 (L) 6.5 - 8.1 g/dL   Albumin 1.9 (L) 3.5 - 5.0 g/dL   AST 14 (L) 15 - 41 U/L   ALT 9 (L) 17 - 63 U/L   Alkaline Phosphatase 71 38 - 126 U/L   Total Bilirubin 0.4 0.3 - 1.2 mg/dL   GFR calc non Af Amer >60 >60 mL/min   GFR calc Af Amer >60 >60 mL/min    Comment: (NOTE) The eGFR has been calculated using the CKD EPI equation. This calculation has not been validated in all clinical situations. eGFR's persistently <60 mL/min signify possible Chronic Kidney Disease.    Anion gap 13 5 - 15  CBC with Differential     Status: Abnormal   Collection Time: 05/19/15 11:10 PM  Result Value Ref Range   WBC 14.0 (H) 4.0 - 10.5 K/uL   RBC 2.67  (L) 4.22 - 5.81 MIL/uL   Hemoglobin 7.6 (L) 13.0 - 17.0 g/dL   HCT 22.9 (L) 39.0 - 52.0 %   MCV 85.8 78.0 - 100.0 fL   MCH 28.5 26.0 - 34.0 pg   MCHC 33.2 30.0 - 36.0 g/dL   RDW 16.1 (H) 11.5 - 15.5 %   Platelets 530 (  H) 150 - 400 K/uL   Neutrophils Relative % 91 (H) 43 - 77 %   Neutro Abs 12.7 (H) 1.7 - 7.7 K/uL   Lymphocytes Relative 3 (L) 12 - 46 %   Lymphs Abs 0.5 (L) 0.7 - 4.0 K/uL   Monocytes Relative 6 3 - 12 %   Monocytes Absolute 0.8 0.1 - 1.0 K/uL   Eosinophils Relative 0 0 - 5 %   Eosinophils Absolute 0.0 0.0 - 0.7 K/uL   Basophils Relative 0 0 - 1 %   Basophils Absolute 0.0 0.0 - 0.1 K/uL  Type and screen     Status: None   Collection Time: 05/19/15 11:10 PM  Result Value Ref Range   ABO/RH(D) A POS    Antibody Screen NEG    Sample Expiration 05/22/2015   Troponin I     Status: Abnormal   Collection Time: 05/19/15 11:10 PM  Result Value Ref Range   Troponin I 0.04 (H) <0.031 ng/mL    Comment:        PERSISTENTLY INCREASED TROPONIN VALUES IN THE RANGE OF 0.04-0.49 ng/mL CAN BE SEEN IN:       -UNSTABLE ANGINA       -CONGESTIVE HEART FAILURE       -MYOCARDITIS       -CHEST TRAUMA       -ARRYHTHMIAS       -LATE PRESENTING MYOCARDIAL INFARCTION       -COPD   CLINICAL FOLLOW-UP RECOMMENDED.   Brain natriuretic peptide     Status: Abnormal   Collection Time: 05/19/15 11:10 PM  Result Value Ref Range   B Natriuretic Peptide 432.0 (H) 0.0 - 100.0 pg/mL  POC occult blood, ED Provider will collect     Status: Abnormal   Collection Time: 05/19/15 11:32 PM  Result Value Ref Range   Fecal Occult Bld POSITIVE (A) NEGATIVE  I-Stat CG4 Lactic Acid, ED     Status: None   Collection Time: 05/19/15 11:33 PM  Result Value Ref Range   Lactic Acid, Venous 1.39 0.5 - 2.0 mmol/L   Dg Chest Portable 1 View  05/19/2015   CLINICAL DATA:  74 year old male with shortness of breath  EXAM: PORTABLE CHEST - 1 VIEW  COMPARISON:  Chest radiograph dated 05/14/2015  FINDINGS:  Single-view of the chest demonstrate emphysematous changes of the lungs. Bilateral lower lung field airspace opacities, new from prior studies and concerning for pneumonia. Clinical correlation and follow-up recommended. A small left pleural effusion may be present. Stable cardiomegaly. Median sternotomy wires. The osseous structures are grossly unremarkable.  IMPRESSION: Bilateral lower lung field airspace opacities concerning for pneumonia. Clinical correlation and follow-up resolution recommended.   Electronically Signed   By: [name] M.D.   On: 05/19/2015 23:07    Review of Systems  Constitutional: Positive for fever. Negative for chills, weight loss, malaise/fatigue and diaphoresis.  HENT: Negative.   Eyes: Negative.   Respiratory: Positive for cough and shortness of breath. Negative for hemoptysis, sputum production and wheezing.   Cardiovascular: Negative.  Negative for chest pain, palpitations, orthopnea, claudication and leg swelling.  Gastrointestinal: Negative.   Genitourinary: Negative.   Musculoskeletal: Negative.   Skin: Negative.   Neurological: Negative.  Negative for weakness.  Endo/Heme/Allergies: Negative.   Psychiatric/Behavioral: Negative.     Blood pressure 114/56, pulse 102, temperature 100.8 F (38.2 C), temperature source Rectal, resp. rate 23, SpO2 92 %. Physical Exam  Constitutional: He appears well-developed and well-nourished.  HENT:  Head: Normocephalic  and atraumatic.  Mouth/Throat: No oropharyngeal exudate.  Eyes: Conjunctivae and EOM are normal. Pupils are equal, round, and reactive to light. No scleral icterus.  Neck: Normal range of motion. Neck supple. No JVD present. No tracheal deviation present. No thyromegaly present.  Cardiovascular: Normal rate and regular rhythm.  Exam reveals no gallop and no friction rub.   No murmur heard. Respiratory: He is in respiratory distress. He has wheezes. He has rales. He exhibits no tenderness.  GI: Soft.  Bowel sounds are normal. He exhibits no distension. There is no tenderness. There is no rebound and no guarding.  Musculoskeletal: Normal range of motion. He exhibits no edema or tenderness.  Lymphadenopathy:    He has no cervical adenopathy.  Neurological: He is alert. He has normal reflexes. He displays normal reflexes. No cranial nerve deficit. He exhibits normal muscle tone. Coordination normal.  Skin: Skin is warm and dry. No rash noted. No erythema. No pallor.  Psychiatric: He has a normal mood and affect. His behavior is normal. Judgment and thought content normal.     Assessment/Plan Fever seconadry to Hcap Blood culture x2 Sputum cx if possible Start on vanco iv pharmacy to dose, and zosyn iv pharmacy to dose and doxycycline.   Anemia Check iron studies, b12, folate, esr If hgb <7.5, then consider transfusion of 2 units prbc. Esp since having symptomatic anemia  Tachycardia Check trop i q6h x3 Check tsh Check cardiac echo  Dm2 fsbs ac and qhs, iss  AMS seconadry to hypoxia Check ABG  DVT prophyalxis:  Lovenox    Jani Gravel 05/20/2015, 12:35 AM

## 2015-05-20 NOTE — Progress Notes (Signed)
A second attempt made to contact the patient's wife, son, or granddaughter as a means of informing them the patient's hemoglobin is critically low and is in need of a blood transfusion in order to receive consent -no one answered

## 2015-05-20 NOTE — Progress Notes (Signed)
PROGRESS NOTE  Stephen [name] BMW:[id number] DOB: June 26, 1941 DOA: 05/19/2015 PCP: Theodoro Clock   HPI: 74 yo M with HTN, HLD, PVD, COPD on home O2, chronic systolic CHF, admitted 2/72 am with SOB, fever, cough, CXR with evidence of HCAP.  Subjective / 24 H Interval events - sleeping with the BiPAP on  Assessment/Plan: Active Problems:   PVD (peripheral vascular disease)   Systolic CHF, chronic   Secondary DM with complication   PAD (peripheral artery disease)   Anemia   Type 2 diabetes mellitus with other specified complication   HCAP (healthcare-associated pneumonia)   Acute-on-chronic respiratory failure  Sepsis due to HCAP - as evidenced by CXR on admission, tachycardia, fever, decreased mentation - started on vacomycin and Zosyn, continue - cultures negative  Acute on chronic hypoxic and hypercarbic resp failure secondary to HCAP and COPD - oxygen support as needed, BiPAP PRN  Chronic systolic heart failure - closely monitor fluid status, 2D echo with EF 30-35% - hold Lasix now due to sepsis physiology, start tomorrow - daily weights  Anemia - in the setting of sepsis, no over bleeding noted. He is FOBT positive which will require GI evaluation at some point - 2U pRBC today - hold Aspirin, Lovenox prophylaxis meanwhile  COPD - no wheezing  H/o CAD/CABG. - Continue home medications  DM - continue Lantus + SSI  Left lower extremity ulcers due to peripheral vascular disease - now s/p left AKA 2 weeks ago, supposed to see vascular as an outpatient  Hyperlipidemia - Continue home medications    Diet: Diet heart healthy/carb modified Room service appropriate?: Yes; Fluid consistency:: Thin Fluids: none DVT Prophylaxis: SCD  Code Status: Full Code Family Communication: no family bedside  Disposition Plan: remain in SDU  Consultants:  None   Procedures:  2D echo   Antibiotics Vancomycin 7/16 >> Zosyn 7/16 >>   Studies  Ct Head Wo  Contrast  05/20/2015   CLINICAL DATA:  Altered mental status, shortness of breath for 2 days, fever and call. History of hypertension, hyperlipidemia.  EXAM: CT HEAD WITHOUT CONTRAST  TECHNIQUE: Contiguous axial images were obtained from the base of the skull through the vertex without intravenous contrast.  COMPARISON:  CT head May 14, 2015  FINDINGS: The ventricles and sulci are normal for age. No intraparenchymal hemorrhage, mass effect nor midline shift. Patchy supratentorial white matter hypodensities are less than expected for patient's age and though non-specific suggest sequelae of chronic small vessel ischemic disease. No acute large vascular territory infarcts.  No abnormal extra-axial fluid collections. Basal cisterns are patent. Mild calcific atherosclerosis of the carotid siphons.  No skull fracture. Old mildly depressed LEFT nasal bone fracture. Moderate ethmoid paranasal sinus mucosal thickening, frothy secretions RIGHT sphenoid sinus. Status post RIGHT ocular lens implant. The mastoid aircells and included paranasal sinuses are well-aerated. Patient is edentulous. Soft tissue within the external auditory canals likely represents cerumen.  IMPRESSION: No acute intracranial process on this mildly motion degraded examination; normal noncontrast CT head for age.  Acute moderate sphenoid ethmoidal sinusitis.   Electronically Signed   By: [name] M.D.   On: 05/20/2015 01:42   Dg Chest Portable 1 View  05/19/2015   CLINICAL DATA:  74 year old male with shortness of breath  EXAM: PORTABLE CHEST - 1 VIEW  COMPARISON:  Chest radiograph dated 05/14/2015  FINDINGS: Single-view of the chest demonstrate emphysematous changes of the lungs. Bilateral lower lung field airspace opacities, new from prior studies and concerning  for pneumonia. Clinical correlation and follow-up recommended. A small left pleural effusion may be present. Stable cardiomegaly. Median sternotomy wires. The osseous structures  are grossly unremarkable.  IMPRESSION: Bilateral lower lung field airspace opacities concerning for pneumonia. Clinical correlation and follow-up resolution recommended.   Electronically Signed   By: [name] M.D.   On: 05/19/2015 23:07    Objective  Filed Vitals:   05/20/15 0344 05/20/15 0400 05/20/15 0500 05/20/15 0600  BP:  120/82 143/64 136/65  Pulse: 107 103 100 90  Temp:  98.1 F (36.7 C)    TempSrc:  Axillary    Resp: 23 23 25 15   Height:      Weight:  54.7 kg (120 lb 9.5 oz)    SpO2: 100% 100% 100% 100%    Intake/Output Summary (Last 24 hours) at 05/20/15 6010 Last data filed at 05/20/15 0039  Gross per 24 hour  Intake      0 ml  Output    600 ml  Net   -600 ml   Filed Weights   05/20/15 0140 05/20/15 0400  Weight: 54.7 kg (120 lb 9.5 oz) 54.7 kg (120 lb 9.5 oz)    Exam:  General:  Sleeping, BiPAP on  HEENT: no scleral icterus, PERRL  Cardiovascular: RRR without MRG, 2+ peripheral pulses, no edema  Respiratory: distant breath sounds, no wheezing  Abdomen: soft, non tender, BS +  MSK/Extremities: no clubbing/cyanosis, no joint swelling; right AKA clean, no drainage,  Skin: no rashes  Data Reviewed: Basic Metabolic Panel:  Recent Labs Lab 05/19/15 2310 05/20/15 0450  NA 129* 130*  K 3.6 3.2*  CL 84* 87*  CO2 32 33*  GLUCOSE 204* 199*  BUN 44* 42*  CREATININE 0.88 0.87  CALCIUM 7.6* 7.4*   Liver Function Tests:  Recent Labs Lab 05/19/15 2310 05/20/15 0450  AST 14* 17  ALT 9* 8*  ALKPHOS 71 65  BILITOT 0.4 0.5  PROT 6.1* 5.5*  ALBUMIN 1.9* 1.7*   CBC:  Recent Labs Lab 05/19/15 2310 05/20/15 0450  WBC 14.0* 12.4*  NEUTROABS 12.7* 11.6*  HGB 7.6* 6.6*  HCT 22.9* 20.4*  MCV 85.8 87.6  PLT 530* 534*   Cardiac Enzymes:  Recent Labs Lab 05/19/15 2310 05/20/15 0204  TROPONINI 0.04* 0.03   BNP (last 3 results)  Recent Labs  12/30/14 1406 02/10/15 1706 05/19/15 2310  BNP 293.0* 848.5* 432.0*    ProBNP (last 3  results)  Recent Labs  07/21/14 2348 08/16/14 1351 08/17/14 0609  PROBNP 3366.0* 6508.0* 9230.0*    CBG:  Recent Labs Lab 05/20/15 0242  GLUCAP 225*    Recent Results (from the past 240 hour(s))  Culture, blood (routine x 2)     Status: None (Preliminary result)   Collection Time: 05/19/15 11:10 PM  Result Value Ref Range Status   Specimen Description BLOOD RIGHT ARM  Final   Special Requests   Final    BOTTLES DRAWN AEROBIC AND ANAEROBIC [place] DRAWN BY RN   Culture PENDING  Incomplete   Report Status PENDING  Incomplete  Culture, blood (routine x 2)     Status: None (Preliminary result)   Collection Time: 05/19/15 11:33 PM  Result Value Ref Range Status   Specimen Description RIGHT ANTECUBITAL  Final   Special Requests BOTTLES DRAWN AEROBIC AND ANAEROBIC 6CC  Final   Culture PENDING  Incomplete   Report Status PENDING  Incomplete     Scheduled Meds: . sodium chloride   Intravenous Once  .  aspirin  325 mg Oral Daily  . atorvastatin  40 mg Oral Daily  . citalopram  20 mg Oral Daily  . doxycycline  100 mg Oral Q12H  . enoxaparin (LOVENOX) injection  40 mg Subcutaneous Q24H  . feeding supplement (PRO-STAT SUGAR FREE 64)  30 mL Oral TID WC  . fenofibrate  160 mg Oral Q breakfast  . furosemide  40 mg Oral Daily  . insulin aspart  0-5 Units Subcutaneous QHS  . insulin aspart  0-9 Units Subcutaneous TID WC  . insulin glargine  10 Units Subcutaneous QHS  . ipratropium-albuterol  3 mL Nebulization Q6H  . lipase/protease/amylase  48,000 Units Oral TID WC  . lisinopril  20 mg Oral Daily  . loratadine  10 mg Oral Daily  . metoprolol tartrate  25 mg Oral BID  . potassium chloride SA  20 mEq Oral BID  . tamsulosin  0.4 mg Oral QHS   Continuous Infusions: . sodium chloride 50 mL/hr at 05/20/15 0140    Marzetta Board, MD [facility] Pager [phone]. If 7 PM - 7 AM, please contact night-coverage at [email or web address], password [facility] 05/20/2015, 7:12 AM  LOS: 0 days

## 2015-05-20 NOTE — Progress Notes (Signed)
*  PRELIMINARY RESULTS* Echocardiogram 2D Echocardiogram has been performed.  Leavy Cella 05/20/2015, 10:07 AM

## 2015-05-20 NOTE — Progress Notes (Signed)
Dr. Maudie Mercury made aware that the lab reported a critically hemoglobin of 6.6.

## 2015-05-20 NOTE — Progress Notes (Addendum)
An attempt made to contact the patient's wife, son, or granddaughter as a means of informing them the patient's hemoglobin is critically low and is in need of a blood transfusion in order to receive consent. -no one answered

## 2015-05-20 NOTE — ED Notes (Signed)
Dr. Maudie Mercury in room assessing patient at this time.

## 2015-05-20 NOTE — Progress Notes (Signed)
Utilization Review Completed.  

## 2015-05-20 NOTE — ED Notes (Signed)
Positive occult blood stool

## 2015-05-20 NOTE — Progress Notes (Signed)
Pt is much more alert. Now taking po med. Family members at bed side.

## 2015-05-21 DIAGNOSIS — J189 Pneumonia, unspecified organism: Secondary | ICD-10-CM

## 2015-05-21 DIAGNOSIS — J9621 Acute and chronic respiratory failure with hypoxia: Secondary | ICD-10-CM

## 2015-05-21 DIAGNOSIS — I5022 Chronic systolic (congestive) heart failure: Secondary | ICD-10-CM

## 2015-05-21 LAB — CBC
HCT: 25.8 % — ABNORMAL LOW (ref 39.0–52.0)
Hemoglobin: 8.7 g/dL — ABNORMAL LOW (ref 13.0–17.0)
MCH: 28.7 pg (ref 26.0–34.0)
MCHC: 33.7 g/dL (ref 30.0–36.0)
MCV: 85.1 fL (ref 78.0–100.0)
PLATELETS: 468 10*3/uL — AB (ref 150–400)
RBC: 3.03 MIL/uL — AB (ref 4.22–5.81)
RDW: 15.8 % — ABNORMAL HIGH (ref 11.5–15.5)
WBC: 8.9 10*3/uL (ref 4.0–10.5)

## 2015-05-21 LAB — GLUCOSE, CAPILLARY
GLUCOSE-CAPILLARY: 224 mg/dL — AB (ref 65–99)
Glucose-Capillary: 256 mg/dL — ABNORMAL HIGH (ref 65–99)
Glucose-Capillary: 247 mg/dL — ABNORMAL HIGH (ref 65–99)
Glucose-Capillary: 271 mg/dL — ABNORMAL HIGH (ref 65–99)

## 2015-05-21 LAB — BASIC METABOLIC PANEL
Anion gap: 7 (ref 5–15)
BUN: 31 mg/dL — ABNORMAL HIGH (ref 6–20)
CHLORIDE: 91 mmol/L — AB (ref 101–111)
CO2: 35 mmol/L — ABNORMAL HIGH (ref 22–32)
Calcium: 7.7 mg/dL — ABNORMAL LOW (ref 8.9–10.3)
Creatinine, Ser: 0.7 mg/dL (ref 0.61–1.24)
Glucose, Bld: 175 mg/dL — ABNORMAL HIGH (ref 65–99)
POTASSIUM: 3.3 mmol/L — AB (ref 3.5–5.1)
Sodium: 133 mmol/L — ABNORMAL LOW (ref 135–145)

## 2015-05-21 LAB — OSMOLALITY, URINE: Osmolality, Ur: 410 mOsm/kg (ref 390–1090)

## 2015-05-21 LAB — OSMOLALITY: OSMOLALITY: 288 mosm/kg (ref 275–300)

## 2015-05-21 LAB — HIV ANTIBODY (ROUTINE TESTING W REFLEX): HIV Screen 4th Generation wRfx: NONREACTIVE

## 2015-05-21 NOTE — Progress Notes (Addendum)
PROGRESS NOTE  Stephen [name]:[id number] DOB: December 25, 1940 DOA: 05/19/2015 PCP: Theodoro Clock   HPI: 74 yo M with HTN, HLD, PVD, COPD on home O2, chronic systolic CHF, admitted 8/11 am with SOB, fever, cough, CXR with evidence of HCAP.  Subjective / 24 H Interval events - alert, cooperative, eating breakfast. Asking to go home as he has "been in the hospital for 6 months"  Assessment/Plan: Active Problems:   PVD (peripheral vascular disease)   Systolic CHF, chronic   Secondary DM with complication   PAD (peripheral artery disease)   Anemia   Type 2 diabetes mellitus with other specified complication   HCAP (healthcare-associated pneumonia)   Acute-on-chronic respiratory failure  Sepsis due to HCAP - as evidenced by CXR on admission, tachycardia, fever, decreased mentation - started on vacomycin and Zosyn, continue - cultures negative - clinically improving  Acute on chronic hypoxic and hypercarbic resp failure secondary to HCAP and COPD - oxygen support as needed, BiPAP PRN - stable on [place], consider floor transfer later today   Chronic systolic heart failure - closely monitor fluid status, 2D echo with EF 30-35% - continue Lasix - daily weights  Anemia - in the setting of sepsis, no over bleeding noted. He is FOBT positive which will require GI evaluation at some point - 2U pRBC today - hold Aspirin, Lovenox prophylaxis meanwhile  COPD - no wheezing  H/o CAD/CABG. - Continue home medications  DM - continue Lantus + SSI  Left lower extremity ulcers due to peripheral vascular disease - now s/p left AKA 2 weeks ago, supposed to see vascular as an outpatient  Hyperlipidemia - Continue home medications    Diet: Diet heart healthy/carb modified Room service appropriate?: Yes; Fluid consistency:: Thin Fluids: none DVT Prophylaxis: SCD  Code Status: Full Code Family Communication: no family bedside  Disposition Plan: home when ready    Consultants:  None   Procedures:  2D echo   Antibiotics Vancomycin 7/16 >> Zosyn 7/16 >>   Studies  Ct Head Wo Contrast  05/20/2015   CLINICAL DATA:  Altered mental status, shortness of breath for 2 days, fever and call. History of hypertension, hyperlipidemia.  EXAM: CT HEAD WITHOUT CONTRAST  TECHNIQUE: Contiguous axial images were obtained from the base of the skull through the vertex without intravenous contrast.  COMPARISON:  CT head May 14, 2015  FINDINGS: The ventricles and sulci are normal for age. No intraparenchymal hemorrhage, mass effect nor midline shift. Patchy supratentorial white matter hypodensities are less than expected for patient's age and though non-specific suggest sequelae of chronic small vessel ischemic disease. No acute large vascular territory infarcts.  No abnormal extra-axial fluid collections. Basal cisterns are patent. Mild calcific atherosclerosis of the carotid siphons.  No skull fracture. Old mildly depressed LEFT nasal bone fracture. Moderate ethmoid paranasal sinus mucosal thickening, frothy secretions RIGHT sphenoid sinus. Status post RIGHT ocular lens implant. The mastoid aircells and included paranasal sinuses are well-aerated. Patient is edentulous. Soft tissue within the external auditory canals likely represents cerumen.  IMPRESSION: No acute intracranial process on this mildly motion degraded examination; normal noncontrast CT head for age.  Acute moderate sphenoid ethmoidal sinusitis.   Electronically Signed   By: [name] M.D.   On: 05/20/2015 01:42   Dg Chest Portable 1 View  05/19/2015   CLINICAL DATA:  74 year old male with shortness of breath  EXAM: PORTABLE CHEST - 1 VIEW  COMPARISON:  Chest radiograph dated 05/14/2015  FINDINGS: Single-view of  the chest demonstrate emphysematous changes of the lungs. Bilateral lower lung field airspace opacities, new from prior studies and concerning for pneumonia. Clinical correlation and follow-up  recommended. A small left pleural effusion may be present. Stable cardiomegaly. Median sternotomy wires. The osseous structures are grossly unremarkable.  IMPRESSION: Bilateral lower lung field airspace opacities concerning for pneumonia. Clinical correlation and follow-up resolution recommended.   Electronically Signed   By: [name] M.D.   On: 05/19/2015 23:07    Objective  Filed Vitals:   05/21/15 1300 05/21/15 1400 05/21/15 1448 05/21/15 1500  BP: 111/48 128/51  129/54  Pulse: 77 80  81  Temp:      TempSrc:      Resp: 16 21    Height:      Weight:      SpO2: 96% 95% 97% 98%    Intake/Output Summary (Last 24 hours) at 05/21/15 1612 Last data filed at 05/21/15 1229  Gross per 24 hour  Intake   1500 ml  Output    950 ml  Net    550 ml   Filed Weights   05/20/15 0140 05/20/15 0400 05/21/15 0500  Weight: 54.7 kg (120 lb 9.5 oz) 54.7 kg (120 lb 9.5 oz) 56 kg (123 lb 7.3 oz)    Exam:  General:  NAD  HEENT: no scleral icterus, PERRL  Cardiovascular: RRR without MRG, 2+ peripheral pulses, no edema  Respiratory: distant breath sounds, no wheezing  Abdomen: soft, non tender, BS +  MSK/Extremities: no clubbing/cyanosis, no joint swelling; right AKA clean, no drainage,  Skin: no rashes  Data Reviewed: Basic Metabolic Panel:  Recent Labs Lab 05/19/15 2310 05/20/15 0450 05/21/15 0427  NA 129* 130* 133*  K 3.6 3.2* 3.3*  CL 84* 87* 91*  CO2 32 33* 35*  GLUCOSE 204* 199* 175*  BUN 44* 42* 31*  CREATININE 0.88 0.87 0.70  CALCIUM 7.6* 7.4* 7.7*   Liver Function Tests:  Recent Labs Lab 05/19/15 2310 05/20/15 0450  AST 14* 17  ALT 9* 8*  ALKPHOS 71 65  BILITOT 0.4 0.5  PROT 6.1* 5.5*  ALBUMIN 1.9* 1.7*   CBC:  Recent Labs Lab 05/19/15 2310 05/20/15 0450 05/20/15 1544 05/21/15 0427  WBC 14.0* 12.4* 13.0* 8.9  NEUTROABS 12.7* 11.6*  --   --   HGB 7.6* 6.6* 9.6* 8.7*  HCT 22.9* 20.4* 27.5* 25.8*  MCV 85.8 87.6 84.9 85.1  PLT 530* 534* 463*  468*   Cardiac Enzymes:  Recent Labs Lab 05/19/15 2310 05/20/15 0204 05/20/15 0737 05/20/15 1544  TROPONINI 0.04* 0.03 0.03 <0.03   BNP (last 3 results)  Recent Labs  12/30/14 1406 02/10/15 1706 05/19/15 2310  BNP 293.0* 848.5* 432.0*    ProBNP (last 3 results)  Recent Labs  07/21/14 2348 08/16/14 1351 08/17/14 0609  PROBNP 3366.0* 6508.0* 9230.0*    CBG:  Recent Labs Lab 05/20/15 1152 05/20/15 1623 05/20/15 2049 05/21/15 0753 05/21/15 1117  GLUCAP 214* 256* 210* 224* 256*    Recent Results (from the past 240 hour(s))  Culture, blood (routine x 2)     Status: None (Preliminary result)   Collection Time: 05/19/15 11:10 PM  Result Value Ref Range Status   Specimen Description BLOOD RIGHT ARM  Final   Special Requests   Final    BOTTLES DRAWN AEROBIC AND ANAEROBIC [place] DRAWN BY RN   Culture PENDING  Incomplete   Report Status PENDING  Incomplete  Culture, blood (routine x 2)  Status: None (Preliminary result)   Collection Time: 05/19/15 11:33 PM  Result Value Ref Range Status   Specimen Description RIGHT ANTECUBITAL  Final   Special Requests BOTTLES DRAWN AEROBIC AND ANAEROBIC 6CC  Final   Culture PENDING  Incomplete   Report Status PENDING  Incomplete  MRSA PCR Screening     Status: None   Collection Time: 05/20/15  1:22 AM  Result Value Ref Range Status   MRSA by PCR NEGATIVE NEGATIVE Final    Comment:        The GeneXpert MRSA Assay (FDA approved for NASAL specimens only), is one component of a comprehensive MRSA colonization surveillance program. It is not intended to diagnose MRSA infection nor to guide or monitor treatment for MRSA infections.      Scheduled Meds: . atorvastatin  40 mg Oral Daily  . citalopram  20 mg Oral Daily  . feeding supplement (PRO-STAT SUGAR FREE 64)  30 mL Oral TID WC  . fenofibrate  160 mg Oral Q breakfast  . furosemide  40 mg Oral Daily  . insulin aspart  0-5 Units Subcutaneous QHS  . insulin aspart   0-9 Units Subcutaneous TID WC  . insulin glargine  10 Units Subcutaneous QHS  . ipratropium-albuterol  3 mL Nebulization Q6H WA  . lipase/protease/amylase  48,000 Units Oral TID WC  . lisinopril  20 mg Oral Daily  . loratadine  10 mg Oral Daily  . metoprolol tartrate  25 mg Oral BID  . piperacillin-tazobactam (ZOSYN)  IV  3.375 g Intravenous Q8H  . potassium chloride SA  20 mEq Oral BID  . tamsulosin  0.4 mg Oral QHS  . vancomycin  750 mg Intravenous Q12H   Continuous Infusions:    Time spent: 25 minutes  Marzetta Board, MD [facility] Pager [phone]. If 7 PM - 7 AM, please contact night-coverage at [email or web address], password [facility] 05/21/2015, 4:12 PM  LOS: 1 day

## 2015-05-21 NOTE — Progress Notes (Signed)
Patient awake turned BiPAP down 12/5 40% f 8 , patient doing well stopped BIPAP for now on 3lpm/[place].

## 2015-05-22 LAB — CBC
HCT: 26.4 % — ABNORMAL LOW (ref 39.0–52.0)
HEMOGLOBIN: 8.9 g/dL — AB (ref 13.0–17.0)
MCH: 29.1 pg (ref 26.0–34.0)
MCHC: 33.7 g/dL (ref 30.0–36.0)
MCV: 86.3 fL (ref 78.0–100.0)
Platelets: 469 10*3/uL — ABNORMAL HIGH (ref 150–400)
RBC: 3.06 MIL/uL — ABNORMAL LOW (ref 4.22–5.81)
RDW: 15.9 % — AB (ref 11.5–15.5)
WBC: 8.1 10*3/uL (ref 4.0–10.5)

## 2015-05-22 LAB — BASIC METABOLIC PANEL
ANION GAP: 8 (ref 5–15)
BUN: 27 mg/dL — ABNORMAL HIGH (ref 6–20)
CALCIUM: 7.7 mg/dL — AB (ref 8.9–10.3)
CO2: 35 mmol/L — ABNORMAL HIGH (ref 22–32)
Chloride: 90 mmol/L — ABNORMAL LOW (ref 101–111)
Creatinine, Ser: 0.69 mg/dL (ref 0.61–1.24)
GFR calc Af Amer: >60 mL/min (ref >60)
GFR calc non Af Amer: >60 mL/min (ref >60)
GLUCOSE: 225 mg/dL — AB (ref 65–99)
POTASSIUM: 3.4 mmol/L — AB (ref 3.5–5.1)
SODIUM: 133 mmol/L — AB (ref 135–145)

## 2015-05-22 LAB — TYPE AND SCREEN
ABO/RH(D): A POS
Antibody Screen: NEGATIVE
UNIT DIVISION: 0
UNIT DIVISION: 0

## 2015-05-22 LAB — GLUCOSE, CAPILLARY
GLUCOSE-CAPILLARY: 195 mg/dL — AB (ref 65–99)
Glucose-Capillary: 178 mg/dL — ABNORMAL HIGH (ref 65–99)
Glucose-Capillary: 293 mg/dL — ABNORMAL HIGH (ref 65–99)
Glucose-Capillary: 346 mg/dL — ABNORMAL HIGH (ref 65–99)

## 2015-05-22 LAB — HEMOGLOBIN A1C
Hgb A1c MFr Bld: 9.6 % — ABNORMAL HIGH (ref 4.8–5.6)
Mean Plasma Glucose: 229 mg/dL

## 2015-05-22 NOTE — Evaluation (Signed)
Physical Therapy Evaluation Patient Details Name: Stephen [name] MRN: [id number] DOB: [birth year] Today's Date: 05/22/2015   History of Present Illness  HPI: 74 yo male with htn, hyperlipidemia, cad, Copd on home o2, apparently had difficulty with breathing and also has been talking crazy for the past 2 days. Slight fever. + cough, nonproductive. Denies cp, palp, orthopnea, pnd, lower ext edema. Pt was brought to ED and found to have anemia, pneumonia, and mild trop leak, possibly due to demand ischemia. Pt will be admitted for Hcap.   Clinical Impression   Pt was seen for evaluation.  He was mildly disoriented by cooperative.  He has been at [facility] since right AKA done 2 weeks ago.  Staples are still in place.  Pt has made quite a bit of progress since his discharge after surgery.  He needed assist to scoot in the bed but was able to do an anterior-posterior scoot to a recliner from bed with verbal cues only.  He will need more practice/instruction in this transfer as well as continued instruction in the safe operation of a w/c.  He reports several falls from a w/c caused from flipping himself over backward.    Follow Up Recommendations SNF    Equipment Recommendations  None recommended by PT    Recommendations for Other Services OT consult     Precautions / Restrictions Precautions Precautions: Fall Restrictions Other Position/Activity Restrictions: bilateral LE amputee      Mobility  Bed Mobility Overal bed mobility: Needs Assistance Bed Mobility: Supine to Sit Rolling: Modified independent (Device/Increase time)   Supine to sit: Min assist Sit to supine: Min assist      Transfers Overall transfer level: Needs assistance Equipment used: None Transfers: Comptroller transfers: Min assist   General transfer comment: pt needs assist to turn sidesitting in the bed but is then able to scoot posteriorly into recliner chair, verbal  cues only  Ambulation/Gait Ambulation/Gait assistance:  (not possible)                                Balance Overall balance assessment: Needs assistance Sitting-balance support: No upper extremity supported Sitting balance-Leahy Scale: Fair                                       Pertinent Vitals/Pain Faces Pain Scale: Hurts little more Pain Location: right stump at incision Pain Intervention(s): Limited activity within patient's tolerance;Patient requesting pain meds-RN notified    Home Living Family/patient expects to be discharged to:: Skilled nursing facility Living Arrangements: Spouse/significant other;Children               Additional Comments: wife ambulates with a w/c    Prior Function Level of Independence: Needs assistance   Gait / Transfers Assistance Needed: not ambulatory, pt has been learning transfers to a w/c and mobility with a w/c  ADL's / Homemaking Assistance Needed: assist with ADLs        Hand Dominance        Extremity/Trunk Assessment   Upper Extremity Assessment: Generalized weakness             RLE Deficits / Details: incision at base of stump with staples present, pt reports soreness...hip ROM is WNL LLE Deficits / Details: hip ROM is WNL  Cervical / Trunk  Assessment: Kyphotic  Communication      Cognition Arousal/Alertness: Awake/alert Behavior During Therapy: WFL for tasks assessed/performed Overall Cognitive Status: Within Functional Limits for tasks assessed (does not know where he is but is able to give history)                                    Assessment/Plan    PT Assessment All further PT needs can be met in the next venue of care  PT Diagnosis Generalized weakness   PT Problem List Decreased strength;Decreased activity tolerance;Decreased mobility;Cardiopulmonary status limiting activity  PT Treatment Interventions     PT Goals (Current goals can be found in the  Care Plan section) Acute Rehab PT Goals PT Goal Formulation: All assessment and education complete, DC therapy    Frequency     Barriers to discharge Decreased caregiver support;Inaccessible home environment cinder blocks set up to act as a step and wife uses a walker, unable to assist....son works during the day                   End of [place] During Treatment: Oxygen Activity Tolerance: Patient tolerated treatment well Patient left: in chair;with call bell/phone within reach Nurse Communication: Mobility status         Time: 6333-5456 PT Time Calculation (min) (ACUTE ONLY): 38 min   Charges:   PT Evaluation $Initial PT Evaluation Tier I: 1 Procedure     PT G CodesDemetrios Isaacs [name]  PT 05/22/2015, 3:40 PM [phone]

## 2015-05-22 NOTE — Progress Notes (Signed)
PROGRESS NOTE  Stephen [name]:[id number] DOB: 07-Dec-1940 DOA: 05/19/2015 PCP: Theodoro Clock   HPI: 74 yo M with HTN, HLD, PVD, COPD on home O2, chronic systolic CHF, admitted 9/47 am with SOB, fever, cough, CXR with evidence of HCAP.  Subjective / 24 H Interval events - alert, NAD, eating breakfast  Assessment/Plan: Active Problems:   PVD (peripheral vascular disease)   Systolic CHF, chronic   Secondary DM with complication   PAD (peripheral artery disease)   Anemia   Type 2 diabetes mellitus with other specified complication   HCAP (healthcare-associated pneumonia)   Acute-on-chronic respiratory failure  Sepsis due to HCAP - as evidenced by CXR on admission, tachycardia, fever, decreased mentation - started on vacomycin and Zosyn, continue - cultures negative - clinically improving  Acute on chronic hypoxic and hypercarbic resp failure secondary to HCAP and COPD - oxygen support as needed, BiPAP PRN - stable on [place], transfer to floor today   Chronic systolic heart failure - closely monitor fluid status, 2D echo with EF 30-35% - continue Lasix - daily weights  Anemia - in the setting of sepsis, no over bleeding noted. He is FOBT positive which will require GI evaluation at some point - 2U pRBC today - hold Aspirin, Lovenox prophylaxis meanwhile  COPD - no wheezing  H/o CAD/CABG. - Continue home medications  DM - continue Lantus + SSI  Left lower extremity ulcers due to peripheral vascular disease - now s/p left AKA 2 weeks ago, supposed to see vascular as an outpatient  Hyperlipidemia - Continue home medications    Diet: Diet heart healthy/carb modified Room service appropriate?: Yes; Fluid consistency:: Thin Fluids: none DVT Prophylaxis: SCD  Code Status: Full Code Family Communication: no family bedside  Disposition Plan: d/c 1 day  Consultants:  None   Procedures:  2D echo   Antibiotics Vancomycin 7/16 >> Zosyn 7/16  >>   Studies  No results found.  Objective  Filed Vitals:   05/22/15 0807 05/22/15 0827 05/22/15 1423 05/22/15 1500  BP:    138/62  Pulse:    73  Temp:  97.6 F (36.4 C)  98.2 F (36.8 C)  TempSrc:  Oral  Oral  Resp:    18  Height:      Weight:      SpO2: 97%  92% 100%    Intake/Output Summary (Last 24 hours) at 05/22/15 1738 Last data filed at 05/22/15 1721  Gross per 24 hour  Intake    920 ml  Output   1950 ml  Net  -1030 ml   Filed Weights   05/20/15 0400 05/21/15 0500 05/22/15 0500  Weight: 54.7 kg (120 lb 9.5 oz) 56 kg (123 lb 7.3 oz) 54.1 kg (119 lb 4.3 oz)    Exam:  General:  NAD  HEENT: no scleral icterus, PERRL  Cardiovascular: RRR without MRG, 2+ peripheral pulses, no edema  Respiratory: distant breath sounds, no wheezing  Abdomen: soft, non tender, BS +  MSK/Extremities: no clubbing/cyanosis, no joint swelling; right AKA clean, no drainage,   Data Reviewed: Basic Metabolic Panel:  Recent Labs Lab 05/19/15 2310 05/20/15 0450 05/21/15 0427 05/22/15 0417  NA 129* 130* 133* 133*  K 3.6 3.2* 3.3* 3.4*  CL 84* 87* 91* 90*  CO2 32 33* 35* 35*  GLUCOSE 204* 199* 175* 225*  BUN 44* 42* 31* 27*  CREATININE 0.88 0.87 0.70 0.69  CALCIUM 7.6* 7.4* 7.7* 7.7*   Liver Function Tests:  Recent  Labs Lab 05/19/15 2310 05/20/15 0450  AST 14* 17  ALT 9* 8*  ALKPHOS 71 65  BILITOT 0.4 0.5  PROT 6.1* 5.5*  ALBUMIN 1.9* 1.7*   CBC:  Recent Labs Lab 05/19/15 2310 05/20/15 0450 05/20/15 1544 05/21/15 0427 05/22/15 0417  WBC 14.0* 12.4* 13.0* 8.9 8.1  NEUTROABS 12.7* 11.6*  --   --   --   HGB 7.6* 6.6* 9.6* 8.7* 8.9*  HCT 22.9* 20.4* 27.5* 25.8* 26.4*  MCV 85.8 87.6 84.9 85.1 86.3  PLT 530* 534* 463* 468* 469*   Cardiac Enzymes:  Recent Labs Lab 05/19/15 2310 05/20/15 0204 05/20/15 0737 05/20/15 1544  TROPONINI 0.04* 0.03 0.03 <0.03   BNP (last 3 results)  Recent Labs  12/30/14 1406 02/10/15 1706 05/19/15 2310  BNP  293.0* 848.5* 432.0*    ProBNP (last 3 results)  Recent Labs  07/21/14 2348 08/16/14 1351 08/17/14 0609  PROBNP 3366.0* 6508.0* 9230.0*    CBG:  Recent Labs Lab 05/21/15 1651 05/21/15 2117 05/22/15 0740 05/22/15 1139 05/22/15 1703  GLUCAP 247* 271* 178* 195* 293*    Recent Results (from the past 240 hour(s))  Culture, blood (routine x 2)     Status: None (Preliminary result)   Collection Time: 05/19/15 11:10 PM  Result Value Ref Range Status   Specimen Description BLOOD RIGHT ARM  Final   Special Requests   Final    BOTTLES DRAWN AEROBIC AND ANAEROBIC [place] DRAWN BY RN   Culture NO GROWTH 3 DAYS  Final   Report Status PENDING  Incomplete  Culture, blood (routine x 2)     Status: None (Preliminary result)   Collection Time: 05/19/15 11:33 PM  Result Value Ref Range Status   Specimen Description RIGHT ANTECUBITAL  Final   Special Requests BOTTLES DRAWN AEROBIC AND ANAEROBIC 6CC  Final   Culture NO GROWTH 3 DAYS  Final   Report Status PENDING  Incomplete  MRSA PCR Screening     Status: None   Collection Time: 05/20/15  1:22 AM  Result Value Ref Range Status   MRSA by PCR NEGATIVE NEGATIVE Final    Comment:        The GeneXpert MRSA Assay (FDA approved for NASAL specimens only), is one component of a comprehensive MRSA colonization surveillance program. It is not intended to diagnose MRSA infection nor to guide or monitor treatment for MRSA infections.   Culture, respiratory (NON-Expectorated)     Status: None (Preliminary result)   Collection Time: 05/21/15  3:30 AM  Result Value Ref Range Status   Specimen Description SPUTUM  Final   Special Requests NONE  Final   Gram Stain   Final    MODERATE WBC PRESENT,BOTH PMN AND MONONUCLEAR RARE SQUAMOUS EPITHELIAL CELLS PRESENT NO ORGANISMS SEEN Performed at [facility]    Culture NO GROWTH Performed at [facility]   Final   Report Status PENDING  Incomplete     Scheduled Meds: .  atorvastatin  40 mg Oral Daily  . citalopram  20 mg Oral Daily  . feeding supplement (PRO-STAT SUGAR FREE 64)  30 mL Oral TID WC  . fenofibrate  160 mg Oral Q breakfast  . furosemide  40 mg Oral Daily  . insulin aspart  0-5 Units Subcutaneous QHS  . insulin aspart  0-9 Units Subcutaneous TID WC  . insulin glargine  10 Units Subcutaneous QHS  . ipratropium-albuterol  3 mL Nebulization Q6H WA  . lipase/protease/amylase  48,000 Units Oral TID WC  .  lisinopril  20 mg Oral Daily  . loratadine  10 mg Oral Daily  . metoprolol tartrate  25 mg Oral BID  . piperacillin-tazobactam (ZOSYN)  IV  3.375 g Intravenous Q8H  . potassium chloride SA  20 mEq Oral BID  . tamsulosin  0.4 mg Oral QHS  . vancomycin  750 mg Intravenous Q12H   Continuous Infusions:     Marzetta Board, MD [facility] Pager [phone]. If 7 PM - 7 AM, please contact night-coverage at [email or web address], password [facility] 05/22/2015, 5:38 PM  LOS: 2 days

## 2015-05-22 NOTE — Progress Notes (Signed)
Inpatient Diabetes Program Recommendations  AACE/ADA: New Consensus Statement on Inpatient Glycemic Control (2013)  Target Ranges:  Prepandial:   less than 140 mg/dL      Peak postprandial:   less than 180 mg/dL (1-2 hours)      Critically ill patients:  140 - 180 mg/dL   Results for Stephen [name], Stephen [name] (MRN [id number]) as of 05/22/2015 07:54  Ref. Range 05/21/2015 07:53 05/21/2015 11:17 05/21/2015 16:51 05/21/2015 21:17 05/22/2015 07:40  Glucose-Capillary Latest Ref Range: 65-99 mg/dL 224 (H) 256 (H) 247 (H) 271 (H) 178 (H)   Diabetes history: DM2 Outpatient Diabetes medications: Lantus 15 units daily (but pt taking 10-30 units as needed), Novolog 0-15 units TID, Metformin 2000 mg BID Current orders for Inpatient glycemic control: Lantus 10 units QHS, Novolog 0-9 units TID with meals, Novolog 0-5 units HS  Inpatient Diabetes Program Recommendations Correction (SSI): Please consider increasing Novolog correcton to moderate scale. Insulin - Meal Coverage: If patient is eating at least 50% of meals, please consider ordering Novolog 3 units TID with meals for meal coverage (in addition to correction).  Thanks, Barnie Alderman, RN, MSN, CCRN, CDE Diabetes Coordinator Inpatient Diabetes Program [phone] (Team Pager from [place] to [place]) [phone] ([facility] office) [phone] [facility] office) [phone] [facility] office)

## 2015-05-22 NOTE — Clinical Social Work Note (Signed)
Clinical Social Work Assessment  Patient Details  Name: Stephen [name] MRN: [id number] Date of Birth: Sep 09, 1941  Date of referral:  05/22/15               Reason for consult:  Facility Placement                Permission sought to share information with:  Family Supports Permission granted to share information::  Yes, Verbal Permission Granted  Name::     Nature conservation officer::     Relationship::  wife  Contact Information:     Housing/Transportation Living arrangements for the past 2 months:  [place] of Information:  Patient, Spouse Patient Interpreter Needed:  None Criminal Activity/Legal Involvement Pertinent to Current Situation/Hospitalization:  No - Comment as needed Significant Relationships:  Spouse, Adult Children Lives with:  Facility Resident Do you feel safe going back to the place where you live?  No (wife requesting new placement) Need for family participation in patient care:  Yes (Comment)  Care giving concerns:  Pt has been resident at [facility] for about 2 weeks.    Social Worker assessment / plan:  CSW met with pt at bedside. Pt pleasant, but not aware of where he was. He did agree for CSW to call his wife to discuss plan. Hoyle Sauer was hard of hearing, but very clear immediately that she did not want pt to return to [facility]. She states that the family does not feel that the got the care needed there. She requests placement elsewhere in [facility]. CSW left SNF list in room and will begin new bed search. 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 [name] wants pt to return home if possible after rehab. She states that she has a son that lives with them and one next door.   Employment status:  Retired Nurse, adult PT Recommendations:  Not assessed at this time [place] / Referral to community resources:  [place]  Patient/Family's Response to care:  Requesting return  to SNF, but not [facility].  Patient/Family's Understanding of and Emotional Response to Diagnosis, Current Treatment, and Prognosis: Difficult on phone to assess pt's wife understanding of extent of pt's needs. She is very hopeful to take pt home from SNF after rehab.   Emotional Assessment Appearance:  Appears stated age Attitude/Demeanor/Rapport:  Unable to Assess Affect (typically observed):  Unable to Assess Orientation:  Oriented to Self Alcohol / Substance use:  Not Applicable Psych involvement (Current and /or in the community):  No (Comment)  Discharge Needs  Concerns to be addressed:  Discharge Planning Concerns Readmission within the last 30 days:  Yes Current discharge risk:  Physical Impairment Barriers to Discharge:  Continued Medical Work up   Stephen [name], [place] 05/22/2015, 11:08 AM [phone]

## 2015-05-22 NOTE — Progress Notes (Signed)
Called report to 300 RN who will be taking care of patient. Vancomycin had not come to floor so I could not give at this time and reported to Nurse. Transferred to 300 bed and transported to room18

## 2015-05-22 NOTE — Care Management Note (Signed)
Case Management Note  Patient Details  Name: Stephen [name] MRN: [id number] Date of Birth: 11-08-1940  Expected Discharge Date:  05/24/15               Expected Discharge Plan:  [place]  In-House Referral:  Clinical Social Work  Discharge planning Services  CM Consult  Post Acute Care Choice:  NA Choice offered to:  NA  DME Arranged:    DME Agency:     HH Arranged:    [place] Agency:     Status of Service:  Completed, signed off  Medicare Important Message Given:    Date Medicare IM Given:    Medicare IM give by:    Date Additional Medicare IM Given:    Additional Medicare Important Message give by:     If discussed at [place] of Stay Meetings, dates discussed:    Additional Comments: Pt is from [facility]. Pt admitted with HCAP. Pt plans to discharge to SNF when ready but does not want to return to [facility]. CSW is working with patient/family arrange for placement at new facility at [place]. No CM needs anticipated.    Sherald Barge, RN 05/22/2015, 2:55 PM

## 2015-05-22 NOTE — Clinical Social Work Placement (Signed)
   CLINICAL SOCIAL WORK PLACEMENT  NOTE  Date:  05/22/2015  Patient Details  Name: Stephen [name] MRN: [id number] Date of Birth: [birth year]  Clinical Social Work is seeking post-discharge placement for this patient at the [place] level of care (*CSW will initial, date and re-position this form in  chart as items are completed):  Yes   Patient/family provided with [place] Work Department's list of facilities offering this level of care within the geographic area requested by the patient (or if unable, by the patient's family).  Yes   Patient/family informed of their freedom to choose among providers that offer the needed level of care, that participate in Medicare, Medicaid or managed care program needed by the patient, have an available bed and are willing to accept the patient.  Yes   Patient/family informed of [facility]'s ownership interest in [facility] and [facility], as well as of the fact that they are under no obligation to receive care at these facilities.  PASRR submitted to EDS on       PASRR number received on       Existing PASRR number confirmed on 05/22/15     FL2 transmitted to all facilities in geographic area requested by pt/family on 05/22/15     FL2 transmitted to all facilities within larger geographic area on       Patient informed that his/her managed care company has contracts with or will negotiate with certain facilities, including the following:            Patient/family informed of bed offers received.  Patient chooses bed at       Physician recommends and patient chooses bed at      Patient to be transferred to   on  .  Patient to be transferred to facility by       Patient family notified on   of transfer.  Name of family member notified:        PHYSICIAN       Additional Comment:    _______________________________________________ Salome Arnt, [place] 05/22/2015, 11:05  AM [phone]

## 2015-05-23 LAB — LEGIONELLA ANTIGEN, URINE

## 2015-05-23 LAB — GLUCOSE, CAPILLARY
GLUCOSE-CAPILLARY: 68 mg/dL (ref 65–99)
Glucose-Capillary: 93 mg/dL (ref 65–99)
Glucose-Capillary: 150 mg/dL — ABNORMAL HIGH (ref 65–99)

## 2015-05-23 MED ORDER — DOXYCYCLINE HYCLATE 100 MG PO CAPS: 100.0000 mg | ORAL_CAPSULE | Freq: Two times a day (BID) | ORAL | Status: DC

## 2015-05-23 MED ORDER — HYDROCODONE-ACETAMINOPHEN 5-325 MG PO TABS: 1.0000 | ORAL_TABLET | Freq: Four times a day (QID) | ORAL | Status: AC | PRN

## 2015-05-23 NOTE — Progress Notes (Signed)
Inpatient Diabetes Program Recommendations  AACE/ADA: New Consensus Statement on Inpatient Glycemic Control (2013)  Target Ranges:  Prepandial:   less than 140 mg/dL      Peak postprandial:   less than 180 mg/dL (1-2 hours)      Critically ill patients:  140 - 180 mg/dL   Results for Stephen [name], Stephen [name] (MRN [id number]) as of 05/23/2015 07:19  Ref. Range 05/22/2015 07:40 05/22/2015 11:39 05/22/2015 17:03 05/22/2015 22:01  Glucose-Capillary Latest Ref Range: 65-99 mg/dL 178 (H) 195 (H) 293 (H) 346 (H)   Diabetes history: DM2 Outpatient Diabetes medications: Lantus 15 units daily (but pt taking 10-30 units as needed), Novolog 0-15 units TID, Metformin 2000 mg BID Current orders for Inpatient glycemic control: Lantus 10 units QHS, Novolog 0-9 units TID with meals, Novolog 0-5 units HS  Inpatient Diabetes Program Recommendations Correction (SSI): Please consider increasing Novolog correcton to moderate scale. Insulin - Meal Coverage: If patient is eating at least 50% of meals, please consider ordering Novolog 4 units TID with meals for meal coverage (in addition to correction).  Thanks, Barnie Alderman, RN, MSN, CCRN, CDE Diabetes Coordinator Inpatient Diabetes Program [phone] (Team Pager from [place] to [place]) [phone] ([facility] office) [phone] [facility] office) [phone] [facility] office)

## 2015-05-23 NOTE — Discharge Summary (Signed)
Physician Discharge Summary  LEXTON [name]:[id number] DOB: 1941-08-26 DOA: 05/19/2015  PCP: [name], PA-C  Admit date: 05/19/2015 Discharge date: 05/23/2015  Time spent: > 35 minutes  Recommendations for Outpatient Follow-up:  1. Follow up with PCP in 1-2 weeks 2. Follow up with Dr. [name] as schedule  Discharge Diagnoses:  Active Problems:   PVD (peripheral vascular disease)   Systolic CHF, chronic   Secondary DM with complication   PAD (peripheral artery disease)   Anemia   Type 2 diabetes mellitus with other specified complication   HCAP (healthcare-associated pneumonia)   Acute-on-chronic respiratory failure   Discharge Condition: stable  Diet recommendation: heart healthy  Filed Weights   05/20/15 0400 05/21/15 0500 05/22/15 0500  Weight: 54.7 kg (120 lb 9.5 oz) 56 kg (123 lb 7.3 oz) 54.1 kg (119 lb 4.3 oz)    History of present illness:  74 yo Stephen [name] with htn, hyperlipidemia, cad, Copd on home o2, apparently had difficulty with breathing and also has been talking crazy for the past 2 days. Slight fever. + cough, nonproductive. Denies cp, palp, orthopnea, pnd, lower ext edema. Pt was brought to ED and found to have anemia, pneumonia, and mild trop leak, possibly due to demand ischemia. Pt will be admitted for Hcap.   Hospital Course:  Sepsis due to HCAP - as evidenced by CXR on admission, tachycardia, fever, decreased mentation - started on vacomycin and Zosyn, patient's clinical condition improved, back to baseline, has received broad spectrum antibiotics for 4 days, cultures have remained negative, antibiotics were narrowed to doxycycline which needs to be continued for 4 additional days to complete an 8 day course.  Acute on chronic hypoxic and hypercarbic resp failure secondary to HCAP and COPD - oxygen support as needed, on chronic O2, currently at baseline Chronic systolic heart failure - closely monitor fluid status as an outpatient with regular  weights, 2D echo with EF 30-35% - continue Lasix Anemia - in the setting of sepsis, no over bleeding noted. He is FOBT positive which will require GI evaluation as an outpatient COPD - no wheezing H/o CAD/CABG. - Continue home medications DM - continue home regimen Left lower extremity ulcers due to peripheral vascular disease - now s/p left AKA 2 weeks ago, supposed to see vascular as an outpatient Hyperlipidemia - Continue home medications   Procedures:  None    Consultations:  None   Discharge Exam: Filed Vitals:   05/22/15 2011 05/22/15 2234 05/23/15 0606 05/23/15 0716  BP:  134/75 133/62   Pulse:  91 88   Temp:  98.6 F (37 C) 98.1 F (36.7 C)   TempSrc:  Oral Oral   Resp:  18 18   Height:      Weight:      SpO2: 95% 100% 96% 97%    General: NAD Cardiovascular: RRR Respiratory: minimal wheezing, moves air well  Discharge Instructions     Medication List    STOP taking these medications        doxycycline 100 MG tablet  Commonly known as:  VIBRA-TABS  Replaced by:  doxycycline 100 MG capsule     potassium chloride SA 20 MEQ tablet  Commonly known as:  K-DUR,KLOR-CON      TAKE these medications        albuterol-ipratropium 18-103 MCG/ACT inhaler  Commonly known as:  COMBIVENT  Inhale 1-2 puffs into the lungs every 6 (six) hours as needed for wheezing or shortness of breath.  aspirin 325 MG tablet  Take 325 mg by mouth daily.     atorvastatin 40 MG tablet  Commonly known as:  LIPITOR  Take 40 mg by mouth daily.     citalopram 20 MG tablet  Commonly known as:  CELEXA  Take 20 mg by mouth daily.     CREON 24000 UNITS Cpep  Generic drug:  Pancrelipase (Lip-Prot-Amyl)  Take 81,191-47,829 Units by mouth See admin instructions. Take 72000 units 3 times daily with all meals and take 48000 units with snacks     doxycycline 100 MG capsule  Commonly known as:  VIBRAMYCIN  Take 1 capsule (100 mg total) by mouth 2 (two) times daily.     feeding  supplement (PRO-STAT SUGAR FREE 64) Liqd  Take 30 mLs by mouth 3 (three) times daily with meals.     fenofibrate 160 MG tablet  Take 160 mg by mouth daily with breakfast.     furosemide 20 MG tablet  Commonly known as:  LASIX  Take 40 mg by mouth daily.     HYDROcodone-acetaminophen 5-325 MG per tablet  Commonly known as:  NORCO/VICODIN  Take 1 tablet by mouth 4 (four) times daily as needed for moderate pain.     insulin aspart 100 UNIT/ML injection  Commonly known as:  novoLOG  Inject 0-15 Units into the skin 3 (three) times daily with meals.     insulin glargine 100 UNIT/ML injection  Commonly known as:  LANTUS  Inject 0.15 mLs (15 Units total) into the skin daily.     levalbuterol 1.25 MG/3ML nebulizer solution  Commonly known as:  XOPENEX  Take 0.63 mg by nebulization every 4 (four) hours as needed for wheezing or shortness of breath.     lisinopril 20 MG tablet  Commonly known as:  PRINIVIL,ZESTRIL  Take 20 mg by mouth daily.     loratadine 10 MG tablet  Commonly known as:  CLARITIN  Take 10 mg by mouth daily.     metFORMIN 1000 MG tablet  Commonly known as:  GLUCOPHAGE  Take 2,000 mg by mouth 2 (two) times daily with a meal.     metolazone 2.5 MG tablet  Commonly known as:  ZAROXOLYN  TAKE ONE (1) TABLET EACH DAY     metoprolol tartrate 25 MG tablet  Commonly known as:  LOPRESSOR  Take 25 mg by mouth 2 (two) times daily.     nitroGLYCERIN 0.4 MG SL tablet  Commonly known as:  NITROSTAT  Place 0.4 mg under the tongue every 5 (five) minutes x 3 doses as needed for chest pain.     OXYGEN  Inhale 3 L into the lungs daily as needed (shortess of breath).     SYMBICORT 160-4.5 MCG/ACT inhaler  Generic drug:  budesonide-formoterol  Inhale 1-2 puffs into the lungs 2 (two) times daily as needed (shorntess of breath).     tamsulosin 0.4 MG Caps capsule  Commonly known as:  FLOMAX  Take 0.4 mg by mouth at bedtime.     traZODone 50 MG tablet  Commonly known as:   DESYREL  Take 50 mg by mouth at bedtime as needed for sleep.           Follow-up Information    Follow up with [name], PA-C. Schedule an appointment as soon as possible for a visit in 1 week.   Specialty:  TEFL teacher information:   [address] [place] [address] [phone]  The results of significant diagnostics from this hospitalization (including imaging, microbiology, ancillary and laboratory) are listed below for reference.    Significant Diagnostic Studies: Ct Head Wo Contrast  05/20/2015   CLINICAL DATA:  Altered mental status, shortness of breath for 2 days, fever and call. History of hypertension, hyperlipidemia.  EXAM: CT HEAD WITHOUT CONTRAST  TECHNIQUE: Contiguous axial images were obtained from the base of the skull through the vertex without intravenous contrast.  COMPARISON:  CT head May 14, 2015  FINDINGS: The ventricles and sulci are normal for age. No intraparenchymal hemorrhage, mass effect nor midline shift. Patchy supratentorial white matter hypodensities are less than expected for patient's age and though non-specific suggest sequelae of chronic small vessel ischemic disease. No acute large vascular territory infarcts.  No abnormal extra-axial fluid collections. Basal cisterns are patent. Mild calcific atherosclerosis of the carotid siphons.  No skull fracture. Old mildly depressed LEFT nasal bone fracture. Moderate ethmoid paranasal sinus mucosal thickening, frothy secretions RIGHT sphenoid sinus. Status post RIGHT ocular lens implant. The mastoid aircells and included paranasal sinuses are well-aerated. Patient is edentulous. Soft tissue within the external auditory canals likely represents cerumen.  IMPRESSION: No acute intracranial process on this mildly motion degraded examination; normal noncontrast CT head for age.  Acute moderate sphenoid ethmoidal sinusitis.   Electronically Signed   By: [name] M.D.   On: 05/20/2015  01:42   Dg Chest Portable 1 View  05/19/2015   CLINICAL DATA:  74 year old Stephen [name] with shortness of breath  EXAM: PORTABLE CHEST - 1 VIEW  COMPARISON:  Chest radiograph dated 05/14/2015  FINDINGS: Single-view of the chest demonstrate emphysematous changes of the lungs. Bilateral lower lung field airspace opacities, new from prior studies and concerning for pneumonia. Clinical correlation and follow-up recommended. A small left pleural effusion may be present. Stable cardiomegaly. Median sternotomy wires. The osseous structures are grossly unremarkable.  IMPRESSION: Bilateral lower lung field airspace opacities concerning for pneumonia. Clinical correlation and follow-up resolution recommended.   Electronically Signed   By: [name] M.D.   On: 05/19/2015 23:07    Microbiology: Recent Results (from the past 240 hour(s))  Culture, blood (routine x 2)     Status: None (Preliminary result)   Collection Time: 05/19/15 11:10 PM  Result Value Ref Range Status   Specimen Description BLOOD RIGHT ARM  Final   Special Requests   Final    BOTTLES DRAWN AEROBIC AND ANAEROBIC [place] DRAWN BY RN   Culture NO GROWTH 3 DAYS  Final   Report Status PENDING  Incomplete  Culture, blood (routine x 2)     Status: None (Preliminary result)   Collection Time: 05/19/15 11:33 PM  Result Value Ref Range Status   Specimen Description RIGHT ANTECUBITAL  Final   Special Requests BOTTLES DRAWN AEROBIC AND ANAEROBIC 6CC  Final   Culture NO GROWTH 3 DAYS  Final   Report Status PENDING  Incomplete  MRSA PCR Screening     Status: None   Collection Time: 05/20/15  1:22 AM  Result Value Ref Range Status   MRSA by PCR NEGATIVE NEGATIVE Final    Comment:        The GeneXpert MRSA Assay (FDA approved for NASAL specimens only), is one component of a comprehensive MRSA colonization surveillance program. It is not intended to diagnose MRSA infection nor to guide or monitor treatment for MRSA infections.   Culture,  respiratory (NON-Expectorated)     Status: None (Preliminary result)   Collection Time:  05/21/15  3:30 AM  Result Value Ref Range Status   Specimen Description SPUTUM  Final   Special Requests NONE  Final   Gram Stain   Final    MODERATE WBC PRESENT,BOTH PMN AND MONONUCLEAR RARE SQUAMOUS EPITHELIAL CELLS PRESENT NO ORGANISMS SEEN Performed at [facility]    Culture NO GROWTH Performed at [facility]   Final   Report Status PENDING  Incomplete     Labs: Basic Metabolic Panel:  Recent Labs Lab 05/19/15 2310 05/20/15 0450 05/21/15 0427 05/22/15 0417  NA 129* 130* 133* 133*  K 3.6 3.2* 3.3* 3.4*  CL 84* 87* 91* 90*  CO2 32 33* 35* 35*  GLUCOSE 204* 199* 175* 225*  BUN 44* 42* 31* 27*  CREATININE 0.88 0.87 0.70 0.69  CALCIUM 7.6* 7.4* 7.7* 7.7*   Liver Function Tests:  Recent Labs Lab 05/19/15 2310 05/20/15 0450  AST 14* 17  ALT 9* 8*  ALKPHOS 71 65  BILITOT 0.4 0.5  PROT 6.1* 5.5*  ALBUMIN 1.9* 1.7*   CBC:  Recent Labs Lab 05/19/15 2310 05/20/15 0450 05/20/15 1544 05/21/15 0427 05/22/15 0417  WBC 14.0* 12.4* 13.0* 8.9 8.1  NEUTROABS 12.7* 11.6*  --   --   --   HGB 7.6* 6.6* 9.6* 8.7* 8.9*  HCT 22.9* 20.4* 27.5* 25.8* 26.4*  MCV Stephen.8 87.6 84.9 Stephen.1 86.3  PLT 530* 534* 463* 468* 469*   Cardiac Enzymes:  Recent Labs Lab 05/19/15 2310 05/20/15 0204 05/20/15 0737 05/20/15 1544  TROPONINI 0.04* 0.03 0.03 <0.03   BNP: BNP (last 3 results)  Recent Labs  12/30/14 1406 02/10/15 1706 05/19/15 2310  BNP 293.0* 848.5* 432.0*    ProBNP (last 3 results)  Recent Labs  07/21/14 2348 08/16/14 1351 08/17/14 0609  PROBNP 3366.0* 6508.0* 9230.0*    CBG:  Recent Labs Lab 05/22/15 1139 05/22/15 1703 05/22/15 2201 05/23/15 0742 05/23/15 0825  GLUCAP 195* 293* 346* 68 93     Signed:  GHERGHE, COSTIN  [facility] 05/23/2015, 10:04 AM

## 2015-05-23 NOTE — Progress Notes (Signed)
CBG 68 this AM, rechecked after breakfast 93. Dr. Cruzita Lederer notified.

## 2015-05-23 NOTE — Progress Notes (Signed)
[place] for Vancomycin and Zosyn Indication: HCAP   Allergies  Allergen Reactions  . Lovaza [Omega-3-Acid Ethyl Esters] Itching  . Lyrica [Pregabalin] Hives  . Zithromax [Azithromycin]     rash  . Levaquin [Levofloxacin] Hives   Patient Measurements: Height: 5\' 10"  (177.8 cm) Weight: 119 lb 4.3 oz (54.1 kg) IBW/kg (Calculated) : 73  Vital Signs: Temp: 98.1 F (36.7 C) (07/19 0606) Temp Source: Oral (07/19 0606) BP: 133/62 mmHg (07/19 0606) Pulse Rate: 88 (07/19 0606) Intake/Output from previous day: 07/18 0701 - 07/19 0700 In: 820 [P.O.:720; IV Piggyback:100] Out: 700 [Urine:700] Intake/Output from this shift: Total I/O In: -  Out: 200 [Urine:200]  Labs:  Recent Labs  05/20/15 1544 05/21/15 0427 05/22/15 0417  WBC 13.0* 8.9 8.1  HGB 9.6* 8.7* 8.9*  PLT 463* 468* 469*  CREATININE  --  0.70 0.69   Estimated Creatinine Clearance: 63.9 mL/min (by C-G formula based on Cr of 0.69). No results for input(s): VANCOTROUGH, VANCOPEAK, VANCORANDOM, GENTTROUGH, GENTPEAK, GENTRANDOM, TOBRATROUGH, TOBRAPEAK, TOBRARND, AMIKACINPEAK, AMIKACINTROU, AMIKACIN in the last 72 hours.   Microbiology: Recent Results (from the past 720 hour(s))  Surgical pcr screen     Status: None   Collection Time: 04/27/15  9:46 AM  Result Value Ref Range Status   MRSA, PCR NEGATIVE NEGATIVE Final   Staphylococcus aureus NEGATIVE NEGATIVE Final    Comment:        The Xpert SA Assay (FDA approved for NASAL specimens in patients over 82 years of age), is one component of a comprehensive surveillance program.  Test performance has been validated by [facility] for patients greater than or equal to 46 year old. It is not intended to diagnose infection nor to guide or monitor treatment.   Culture, blood (routine x 2)     Status: None (Preliminary result)   Collection Time: 05/19/15 11:10 PM  Result Value Ref Range Status   Specimen Description BLOOD RIGHT  ARM  Final   Special Requests   Final    BOTTLES DRAWN AEROBIC AND ANAEROBIC [place] DRAWN BY RN   Culture NO GROWTH 3 DAYS  Final   Report Status PENDING  Incomplete  Culture, blood (routine x 2)     Status: None (Preliminary result)   Collection Time: 05/19/15 11:33 PM  Result Value Ref Range Status   Specimen Description RIGHT ANTECUBITAL  Final   Special Requests BOTTLES DRAWN AEROBIC AND ANAEROBIC 6CC  Final   Culture NO GROWTH 3 DAYS  Final   Report Status PENDING  Incomplete  MRSA PCR Screening     Status: None   Collection Time: 05/20/15  1:22 AM  Result Value Ref Range Status   MRSA by PCR NEGATIVE NEGATIVE Final    Comment:        The GeneXpert MRSA Assay (FDA approved for NASAL specimens only), is one component of a comprehensive MRSA colonization surveillance program. It is not intended to diagnose MRSA infection nor to guide or monitor treatment for MRSA infections.   Culture, respiratory (NON-Expectorated)     Status: None (Preliminary result)   Collection Time: 05/21/15  3:30 AM  Result Value Ref Range Status   Specimen Description SPUTUM  Final   Special Requests NONE  Final   Gram Stain   Final    MODERATE WBC PRESENT,BOTH PMN AND MONONUCLEAR RARE SQUAMOUS EPITHELIAL CELLS PRESENT NO ORGANISMS SEEN Performed at [facility]    Culture NO GROWTH Performed at [facility]  Final   Report Status PENDING  Incomplete   Medical History: Past Medical History  Diagnosis Date  . Hypertension   . Pancreatitis   . Neuropathy   . Hyperlipemia   . Peripheral vascular disease     a. 10/2013: severely abnormal ABIs, for OP evaluation.  [name] GERD (gastroesophageal reflux disease)   . PUD (peptic ulcer disease)     diagnosed via EGD by Dr. Braulio Bosch at [place]  . Diverticulosis   . Hemorrhoids   . Coronary artery disease      a. BMS to LAD 1999. b. HSRA to LAD and stent to RCA 2007. c. NSTEMI/CABG x 3 in 04/2010. d. NSTEMI 10/2013 - secondary to  demand ischemia in setting of COPD exacerbation with severe underlying CAD - cath with 3/3 patent grafts, for medical therapy.   [name] COPD (chronic obstructive pulmonary disease)     a. Ongoing tobacco abuse.  . Arthritis   . Neutropenia     a. Dx 10/2013 on labs - instructed to f/u PCP.  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   . Ischemic cardiomyopathy     a. previously EF 30% in 2011. b. 10/2013: EF 45-50% by echo.  . Alcoholism     a. Remote alcoholism  . 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  [name] Ulcer of right foot   . Chronic kidney disease   . Sinusitis   . Myocardial infarction   . Shortness of breath dyspnea   . History of hiatal hernia   . Pneumonia     "a couple times; years ago' (05/03/2015)  . Type II diabetes mellitus    Anti-infectives    Start     Dose/Rate Route Frequency Ordered Stop   05/20/15 1000  vancomycin (VANCOCIN) IVPB 750 mg/150 ml premix     750 mg 150 mL/hr over 60 Minutes Intravenous Every 12 hours 05/20/15 0817     05/20/15 0900  piperacillin-tazobactam (ZOSYN) IVPB 3.375 g     3.375 g 12.5 mL/hr over 240 Minutes Intravenous Every 8 hours 05/20/15 0820     05/20/15 0130  doxycycline (VIBRA-TABS) tablet 100 mg  Status:  Discontinued     100 mg Oral Every 12 hours 05/20/15 0119 05/20/15 1334   05/19/15 2345  piperacillin-tazobactam (ZOSYN) IVPB 3.375 g     3.375 g 100 mL/hr over 30 Minutes Intravenous  Once 05/19/15 2340 05/20/15 0210   05/19/15 2345  vancomycin (VANCOCIN) IVPB 1000 mg/200 mL premix     1,000 mg 200 mL/hr over 60 Minutes Intravenous  Once 05/19/15 2340 05/20/15 0125     Assessment: 74yo male with multiple medical issues.  Pt admitted with pneumonia and anemia.  SCr is stable.    Goal of Therapy:  Vancomycin trough level 15-20 mcg/ml  Plan:  Continue Vancomycin 750mg  IV q12hrs Check trough at steady state  (likely d/c to SNF today per progression rounds, will defer trough). Zosyn 3.375gm IV q8h, each dose over 4 hrs Deescalate ABX when improved / appropriate Monitor labs, renal fxn, and c/s  Pricilla Larsson 05/23/2015,9:46 AM

## 2015-05-23 NOTE — Care Management Important Message (Signed)
Important Message  Patient Details  Name: KESHAWN FIORITO MRN: [id number] Date of Birth: 01/05/1941   Medicare Important Message Given:  Yes-second notification given    Joylene Draft, RN 05/23/2015, 11:53 AM

## 2015-05-23 NOTE — Progress Notes (Signed)
Patient with orders to be discharge home. Discharge instructions given, patient and son verbalized understanding. Prescriptions given. Patient stable. Patient left in private vehicle with son.

## 2015-05-23 NOTE — Clinical Social Work Note (Addendum)
CSW spoke with pt's wife and son regarding d/c plan. Both would prefer for pt to come home and they are aware of bed offer at [facility]. They are aware of how pt did with PT yesterday and feel this is manageable. While son (who lives with them) is at work, pt's daughter-in-law is next door and available per family. CM discussed home health with family. Son will pick up pt this afternoon. RN updated. Discussed with pt who is relieved.   Benay Pike, [place]

## 2015-05-23 NOTE — Care Management Note (Signed)
Case Management Note  Patient Details  Name: Stephen [name] MRN: [id number] Date of Birth: 1941/07/17  Subjective/Objective:                    Action/Plan:   Expected Discharge Date:  05/24/15               Expected Discharge Plan:  [place]  In-House Referral:  Clinical Social Work  Discharge planning Services  CM Consult  Post Acute Care Choice:  Home Health, Durable Medical Equipment Choice offered to:  Adult Children  DME Arranged:  Other see comment (slide board/transfer board) DME Agency:  [facility]  HH Arranged:  RN, PT, OT HH Agency:  [place]  Status of Service:  Completed, signed off  Medicare Important Message Given:    Date Medicare IM Given:    Medicare IM give by:    Date Additional Medicare IM Given:    Additional Medicare Important Message give by:     If discussed at [place] of Stay Meetings, dates discussed:    Additional Comments: Pt discharging home today with [facility] RN, Pt, OT (per pts son choice). Romualdo Bolk of [facility] is aware and will collect pts information from the chart. [place] services to start within 48 hours of discharge. Pt has home O2, neb machine, w/c, electric w/c, hospital bed at home. Pts son will be given script for transfer board and they will obtain from [place]. Pts son and pts nurse aware of discharge arrangements. Christinia Gully [place], RN 05/23/2015, 11:51 AM

## 2015-05-24 LAB — CULTURE, BLOOD (ROUTINE X 2)
CULTURE: NO GROWTH
CULTURE: NO GROWTH

## 2015-05-25 LAB — CULTURE, RESPIRATORY W GRAM STAIN

## 2015-05-25 LAB — CULTURE, RESPIRATORY

## 2015-05-28 ENCOUNTER — Emergency Department (HOSPITAL_COMMUNITY): Payer: Medicare Other

## 2015-05-28 ENCOUNTER — Encounter (HOSPITAL_COMMUNITY): Payer: Self-pay | Admitting: Emergency Medicine

## 2015-05-28 ENCOUNTER — Observation Stay (HOSPITAL_COMMUNITY)
Admission: EM | Admit: 2015-05-28 | Discharge: 2015-05-28 | Disposition: A | Discharge status: Home / Self Care | Payer: Medicare Other | Source: Home / Self Care | Attending: Emergency Medicine | Admitting: Emergency Medicine

## 2015-05-28 ENCOUNTER — Inpatient Hospital Stay (HOSPITAL_COMMUNITY)
Admission: EM | Admit: 2015-05-28 | Discharge: 2015-05-30 | DRG: 728 | Disposition: A | Discharge status: Home Health | Payer: Medicare Other | Attending: Internal Medicine | Admitting: Internal Medicine

## 2015-05-28 ENCOUNTER — Encounter (HOSPITAL_COMMUNITY): Payer: Self-pay

## 2015-05-28 DIAGNOSIS — Z794 Long term (current) use of insulin: Secondary | ICD-10-CM

## 2015-05-28 DIAGNOSIS — Z7951 Long term (current) use of inhaled steroids: Secondary | ICD-10-CM | POA: Diagnosis not present

## 2015-05-28 DIAGNOSIS — F17220 Nicotine dependence, chewing tobacco, uncomplicated: Secondary | ICD-10-CM | POA: Diagnosis present

## 2015-05-28 DIAGNOSIS — Z79899 Other long term (current) drug therapy: Secondary | ICD-10-CM | POA: Diagnosis not present

## 2015-05-28 DIAGNOSIS — F1011 Alcohol abuse, in remission: Secondary | ICD-10-CM | POA: Diagnosis present

## 2015-05-28 DIAGNOSIS — J9610 Chronic respiratory failure, unspecified whether with hypoxia or hypercapnia: Secondary | ICD-10-CM | POA: Diagnosis present

## 2015-05-28 DIAGNOSIS — N189 Chronic kidney disease, unspecified: Secondary | ICD-10-CM | POA: Diagnosis present

## 2015-05-28 DIAGNOSIS — J449 Chronic obstructive pulmonary disease, unspecified: Secondary | ICD-10-CM | POA: Diagnosis present

## 2015-05-28 DIAGNOSIS — E1169 Type 2 diabetes mellitus with other specified complication: Secondary | ICD-10-CM | POA: Diagnosis not present

## 2015-05-28 DIAGNOSIS — I252 Old myocardial infarction: Secondary | ICD-10-CM | POA: Diagnosis not present

## 2015-05-28 DIAGNOSIS — Z951 Presence of aortocoronary bypass graft: Secondary | ICD-10-CM | POA: Diagnosis not present

## 2015-05-28 DIAGNOSIS — E876 Hypokalemia: Secondary | ICD-10-CM | POA: Diagnosis present

## 2015-05-28 DIAGNOSIS — Z833 Family history of diabetes mellitus: Secondary | ICD-10-CM

## 2015-05-28 DIAGNOSIS — E1122 Type 2 diabetes mellitus with diabetic chronic kidney disease: Secondary | ICD-10-CM | POA: Diagnosis present

## 2015-05-28 DIAGNOSIS — Z89611 Acquired absence of right leg above knee: Secondary | ICD-10-CM

## 2015-05-28 DIAGNOSIS — Z8249 Family history of ischemic heart disease and other diseases of the circulatory system: Secondary | ICD-10-CM

## 2015-05-28 DIAGNOSIS — L03818 Cellulitis of other sites: Secondary | ICD-10-CM

## 2015-05-28 DIAGNOSIS — IMO0002 Reserved for concepts with insufficient information to code with codable children: Secondary | ICD-10-CM

## 2015-05-28 DIAGNOSIS — Z7982 Long term (current) use of aspirin: Secondary | ICD-10-CM

## 2015-05-28 DIAGNOSIS — N492 Inflammatory disorders of scrotum: Principal | ICD-10-CM | POA: Diagnosis present

## 2015-05-28 DIAGNOSIS — I255 Ischemic cardiomyopathy: Secondary | ICD-10-CM | POA: Diagnosis present

## 2015-05-28 DIAGNOSIS — L0390 Cellulitis, unspecified: Secondary | ICD-10-CM | POA: Diagnosis present

## 2015-05-28 DIAGNOSIS — N433 Hydrocele, unspecified: Secondary | ICD-10-CM | POA: Diagnosis present

## 2015-05-28 DIAGNOSIS — E785 Hyperlipidemia, unspecified: Secondary | ICD-10-CM | POA: Diagnosis present

## 2015-05-28 DIAGNOSIS — D638 Anemia in other chronic diseases classified elsewhere: Secondary | ICD-10-CM | POA: Diagnosis present

## 2015-05-28 DIAGNOSIS — I129 Hypertensive chronic kidney disease with stage 1 through stage 4 chronic kidney disease, or unspecified chronic kidney disease: Secondary | ICD-10-CM | POA: Diagnosis present

## 2015-05-28 DIAGNOSIS — E1140 Type 2 diabetes mellitus with diabetic neuropathy, unspecified: Secondary | ICD-10-CM | POA: Diagnosis present

## 2015-05-28 DIAGNOSIS — N508 Other specified disorders of male genital organs: Secondary | ICD-10-CM | POA: Diagnosis present

## 2015-05-28 DIAGNOSIS — K219 Gastro-esophageal reflux disease without esophagitis: Secondary | ICD-10-CM | POA: Diagnosis present

## 2015-05-28 DIAGNOSIS — I5042 Chronic combined systolic (congestive) and diastolic (congestive) heart failure: Secondary | ICD-10-CM | POA: Diagnosis present

## 2015-05-28 DIAGNOSIS — I10 Essential (primary) hypertension: Secondary | ICD-10-CM

## 2015-05-28 DIAGNOSIS — Z9981 Dependence on supplemental oxygen: Secondary | ICD-10-CM | POA: Diagnosis not present

## 2015-05-28 DIAGNOSIS — F17210 Nicotine dependence, cigarettes, uncomplicated: Secondary | ICD-10-CM | POA: Diagnosis present

## 2015-05-28 HISTORY — DX: Anemia, unspecified: D64.9

## 2015-05-28 LAB — I-STAT TROPONIN, ED: Troponin i, poc: 0.03 ng/mL (ref 0.00–0.08)

## 2015-05-28 LAB — CBC WITH DIFFERENTIAL/PLATELET
BASOS PCT: 0 % (ref 0–1)
Basophils Absolute: 0 10*3/uL (ref 0.0–0.1)
Eosinophils Absolute: 0.1 10*3/uL (ref 0.0–0.7)
Eosinophils Relative: 1 % (ref 0–5)
HCT: 28 % — ABNORMAL LOW (ref 39.0–52.0)
HEMOGLOBIN: 9.4 g/dL — AB (ref 13.0–17.0)
LYMPHS ABS: 0.8 10*3/uL (ref 0.7–4.0)
Lymphocytes Relative: 10 % — ABNORMAL LOW (ref 12–46)
MCH: 28.9 pg (ref 26.0–34.0)
MCHC: 33.6 g/dL (ref 30.0–36.0)
MCV: 86.2 fL (ref 78.0–100.0)
MONO ABS: 0.5 10*3/uL (ref 0.1–1.0)
MONOS PCT: 6 % (ref 3–12)
NEUTROS ABS: 6.3 10*3/uL (ref 1.7–7.7)
Neutrophils Relative %: 83 % — ABNORMAL HIGH (ref 43–77)
Platelets: 399 10*3/uL (ref 150–400)
RBC: 3.25 MIL/uL — ABNORMAL LOW (ref 4.22–5.81)
RDW: 15.4 % (ref 11.5–15.5)
WBC: 7.7 10*3/uL (ref 4.0–10.5)

## 2015-05-28 LAB — COMPREHENSIVE METABOLIC PANEL
ALT: 8 U/L — ABNORMAL LOW (ref 17–63)
ANION GAP: 7 (ref 5–15)
AST: 16 U/L (ref 15–41)
Albumin: 1.8 g/dL — ABNORMAL LOW (ref 3.5–5.0)
Alkaline Phosphatase: 70 U/L (ref 38–126)
BILIRUBIN TOTAL: 0.3 mg/dL (ref 0.3–1.2)
BUN: 14 mg/dL (ref 6–20)
CO2: 31 mmol/L (ref 22–32)
Calcium: 7.7 mg/dL — ABNORMAL LOW (ref 8.9–10.3)
Chloride: 92 mmol/L — ABNORMAL LOW (ref 101–111)
Creatinine, Ser: 0.67 mg/dL (ref 0.61–1.24)
GFR calc Af Amer: >60 mL/min (ref >60)
GFR calc non Af Amer: >60 mL/min (ref >60)
Glucose, Bld: 356 mg/dL — ABNORMAL HIGH (ref 65–99)
Potassium: 3.1 mmol/L — ABNORMAL LOW (ref 3.5–5.1)
Sodium: 130 mmol/L — ABNORMAL LOW (ref 135–145)
Total Protein: 5.7 g/dL — ABNORMAL LOW (ref 6.5–8.1)

## 2015-05-28 LAB — MRSA PCR SCREENING: MRSA BY PCR: NEGATIVE

## 2015-05-28 LAB — GLUCOSE, CAPILLARY: GLUCOSE-CAPILLARY: 362 mg/dL — AB (ref 65–99)

## 2015-05-28 LAB — BRAIN NATRIURETIC PEPTIDE: B Natriuretic Peptide: 792 pg/mL — ABNORMAL HIGH (ref 0.0–100.0)

## 2015-05-28 LAB — I-STAT CG4 LACTIC ACID, ED: Lactic Acid, Venous: 0.49 mmol/L — ABNORMAL LOW (ref 0.5–2.0)

## 2015-05-28 MED ORDER — INSULIN ASPART 100 UNIT/ML ~~LOC~~ SOLN
0.0000 [IU] | Freq: Three times a day (TID) | SUBCUTANEOUS | Status: DC
  Administered 2015-05-29 (×2): 2 [IU] via SUBCUTANEOUS
  Administered 2015-05-30: 3 [IU] via SUBCUTANEOUS
  Administered 2015-05-30: 11 [IU] via SUBCUTANEOUS

## 2015-05-28 MED ORDER — CLINDAMYCIN PHOSPHATE 600 MG/50ML IV SOLN
600.0000 mg | Freq: Once | INTRAVENOUS | Status: AC
  Administered 2015-05-28: 600 mg via INTRAVENOUS
  Filled 2015-05-28: qty 50

## 2015-05-28 MED ORDER — IPRATROPIUM-ALBUTEROL 0.5-2.5 (3) MG/3ML IN SOLN: 3.0000 mL | Freq: Four times a day (QID) | RESPIRATORY_TRACT | Status: DC | PRN

## 2015-05-28 MED ORDER — NITROGLYCERIN 0.4 MG SL SUBL: 0.4000 mg | SUBLINGUAL_TABLET | SUBLINGUAL | Status: DC | PRN

## 2015-05-28 MED ORDER — TAMSULOSIN HCL 0.4 MG PO CAPS
0.4000 mg | ORAL_CAPSULE | Freq: Every day | ORAL | Status: DC
  Administered 2015-05-28 – 2015-05-29 (×2): 0.4 mg via ORAL
  Filled 2015-05-28 (×2): qty 1

## 2015-05-28 MED ORDER — PIPERACILLIN-TAZOBACTAM 3.375 G IVPB
INTRAVENOUS | Status: AC
  Filled 2015-05-28: qty 100

## 2015-05-28 MED ORDER — ASPIRIN 325 MG PO TABS
325.0000 mg | ORAL_TABLET | Freq: Every day | ORAL | Status: DC
  Administered 2015-05-28 – 2015-05-30 (×3): 325 mg via ORAL
  Filled 2015-05-28 (×3): qty 1

## 2015-05-28 MED ORDER — METOLAZONE 2.5 MG PO TABS
2.5000 mg | ORAL_TABLET | Freq: Every day | ORAL | Status: DC
  Administered 2015-05-29 – 2015-05-30 (×2): 2.5 mg via ORAL
  Filled 2015-05-28 (×2): qty 1

## 2015-05-28 MED ORDER — LISINOPRIL 10 MG PO TABS
20.0000 mg | ORAL_TABLET | Freq: Every day | ORAL | Status: DC
  Administered 2015-05-28 – 2015-05-30 (×3): 20 mg via ORAL
  Filled 2015-05-28 (×3): qty 2

## 2015-05-28 MED ORDER — LORATADINE 10 MG PO TABS
10.0000 mg | ORAL_TABLET | Freq: Every day | ORAL | Status: DC
  Administered 2015-05-28 – 2015-05-30 (×3): 10 mg via ORAL
  Filled 2015-05-28 (×3): qty 1

## 2015-05-28 MED ORDER — INSULIN GLARGINE 100 UNIT/ML ~~LOC~~ SOLN
15.0000 [IU] | Freq: Every day | SUBCUTANEOUS | Status: DC
  Administered 2015-05-28 – 2015-05-29 (×2): 15 [IU] via SUBCUTANEOUS
  Filled 2015-05-28 (×3): qty 0.15

## 2015-05-28 MED ORDER — PIPERACILLIN-TAZOBACTAM 3.375 G IVPB
3.3750 g | Freq: Three times a day (TID) | INTRAVENOUS | Status: DC
  Administered 2015-05-28 – 2015-05-30 (×6): 3.375 g via INTRAVENOUS
  Filled 2015-05-28 (×9): qty 50

## 2015-05-28 MED ORDER — ATORVASTATIN CALCIUM 40 MG PO TABS
40.0000 mg | ORAL_TABLET | Freq: Every day | ORAL | Status: DC
  Administered 2015-05-28 – 2015-05-30 (×3): 40 mg via ORAL
  Filled 2015-05-28 (×3): qty 1

## 2015-05-28 MED ORDER — INSULIN ASPART 100 UNIT/ML ~~LOC~~ SOLN
0.0000 [IU] | Freq: Every day | SUBCUTANEOUS | Status: DC
  Administered 2015-05-28: 5 [IU] via SUBCUTANEOUS
  Administered 2015-05-29: 3 [IU] via SUBCUTANEOUS

## 2015-05-28 MED ORDER — NICOTINE 21 MG/24HR TD PT24
21.0000 mg | MEDICATED_PATCH | Freq: Every day | TRANSDERMAL | Status: DC
  Administered 2015-05-28 – 2015-05-30 (×3): 21 mg via TRANSDERMAL
  Filled 2015-05-28 (×3): qty 1

## 2015-05-28 MED ORDER — BUDESONIDE-FORMOTEROL FUMARATE 160-4.5 MCG/ACT IN AERO
1.0000 | INHALATION_SPRAY | Freq: Two times a day (BID) | RESPIRATORY_TRACT | Status: DC | PRN
  Filled 2015-05-28: qty 6

## 2015-05-28 MED ORDER — ALBUTEROL SULFATE (5 MG/ML) 0.5% IN NEBU: 2.5000 mg | INHALATION_SOLUTION | RESPIRATORY_TRACT | Status: DC | PRN

## 2015-05-28 MED ORDER — TRAZODONE HCL 50 MG PO TABS: 50.0000 mg | ORAL_TABLET | Freq: Every evening | ORAL | Status: DC | PRN

## 2015-05-28 MED ORDER — PANCRELIPASE (LIP-PROT-AMYL) 12000-38000 UNITS PO CPEP
48000.0000 [IU] | ORAL_CAPSULE | ORAL | Status: DC
  Administered 2015-05-29 (×3): 48000 [IU] via ORAL

## 2015-05-28 MED ORDER — ONDANSETRON HCL 4 MG/2ML IJ SOLN
4.0000 mg | Freq: Four times a day (QID) | INTRAMUSCULAR | Status: DC | PRN
Start: 2015-05-28 — End: 2015-05-30

## 2015-05-28 MED ORDER — PANCRELIPASE (LIP-PROT-AMYL) 12000-38000 UNITS PO CPEP
72000.0000 [IU] | ORAL_CAPSULE | Freq: Three times a day (TID) | ORAL | Status: DC
  Administered 2015-05-29 – 2015-05-30 (×5): 72000 [IU] via ORAL
  Filled 2015-05-28 (×5): qty 6

## 2015-05-28 MED ORDER — HYDROCODONE-ACETAMINOPHEN 5-325 MG PO TABS
1.0000 | ORAL_TABLET | Freq: Four times a day (QID) | ORAL | Status: DC | PRN
  Administered 2015-05-29 – 2015-05-30 (×3): 1 via ORAL
  Filled 2015-05-28 (×3): qty 1

## 2015-05-28 MED ORDER — ENOXAPARIN SODIUM 40 MG/0.4ML ~~LOC~~ SOLN
40.0000 mg | SUBCUTANEOUS | Status: DC
  Administered 2015-05-28 – 2015-05-29 (×2): 40 mg via SUBCUTANEOUS
  Filled 2015-05-28 (×2): qty 0.4

## 2015-05-28 MED ORDER — VANCOMYCIN HCL 10 G IV SOLR
1500.0000 mg | INTRAVENOUS | Status: DC
  Administered 2015-05-28 – 2015-05-29 (×2): 1500 mg via INTRAVENOUS
  Filled 2015-05-28 (×3): qty 1500

## 2015-05-28 MED ORDER — FENOFIBRATE 160 MG PO TABS
160.0000 mg | ORAL_TABLET | Freq: Every day | ORAL | Status: DC
  Administered 2015-05-29 – 2015-05-30 (×2): 160 mg via ORAL
  Filled 2015-05-28 (×2): qty 1

## 2015-05-28 MED ORDER — METFORMIN HCL 500 MG PO TABS
1000.0000 mg | ORAL_TABLET | Freq: Two times a day (BID) | ORAL | Status: DC
  Administered 2015-05-29: 1000 mg via ORAL
  Filled 2015-05-28: qty 2

## 2015-05-28 MED ORDER — CHLORHEXIDINE GLUCONATE 0.12 % MT SOLN
15.0000 mL | Freq: Two times a day (BID) | OROMUCOSAL | Status: DC
  Administered 2015-05-28 – 2015-05-29 (×3): 15 mL via OROMUCOSAL
  Filled 2015-05-28 (×3): qty 15

## 2015-05-28 MED ORDER — ONDANSETRON HCL 4 MG/2ML IJ SOLN: 4.0000 mg | Freq: Three times a day (TID) | INTRAMUSCULAR | Status: DC | PRN

## 2015-05-28 MED ORDER — FUROSEMIDE 40 MG PO TABS
40.0000 mg | ORAL_TABLET | Freq: Every day | ORAL | Status: DC
  Administered 2015-05-28 – 2015-05-30 (×3): 40 mg via ORAL
  Filled 2015-05-28 (×3): qty 1

## 2015-05-28 MED ORDER — METOPROLOL TARTRATE 25 MG PO TABS
25.0000 mg | ORAL_TABLET | Freq: Two times a day (BID) | ORAL | Status: DC
  Administered 2015-05-28 – 2015-05-30 (×4): 25 mg via ORAL
  Filled 2015-05-28 (×4): qty 1

## 2015-05-28 MED ORDER — ONDANSETRON HCL 4 MG PO TABS: 4.0000 mg | ORAL_TABLET | Freq: Four times a day (QID) | ORAL | Status: DC | PRN

## 2015-05-28 MED ORDER — CETYLPYRIDINIUM CHLORIDE 0.05 % MT LIQD
7.0000 mL | Freq: Two times a day (BID) | OROMUCOSAL | Status: DC
  Administered 2015-05-29 – 2015-05-30 (×3): 7 mL via OROMUCOSAL

## 2015-05-28 MED ORDER — PANCRELIPASE (LIP-PROT-AMYL) 12000-38000 UNITS PO CPEP: 48000.0000 [IU] | ORAL_CAPSULE | ORAL | Status: DC

## 2015-05-28 MED ORDER — CLINDAMYCIN HCL 150 MG PO CAPS: ORAL_CAPSULE | ORAL | Status: DC

## 2015-05-28 NOTE — Discharge Instructions (Signed)
°Emergency Department Resource Guide °1) Find a Doctor and Pay Out of Pocket °Although you won't have to find out who is covered by your insurance plan, it is a good idea to ask around and get recommendations. You will then need to call the office and see if the doctor you have chosen will accept you as a new patient and what types of options they offer for patients who are self-pay. Some doctors offer discounts or will set up payment plans for their patients who do not have insurance, but you will need to ask so you aren't surprised when you get to your appointment. ° °2) Contact Your Local Health Department °Not all health departments have doctors that can see patients for sick visits, but many do, so it is worth a call to see if yours does. If you don't know where your local health department is, you can check in your phone book. The CDC also has a tool to help you locate your state's health department, and many state websites also have listings of all of their local health departments. ° °3) Find a Walk-in Clinic °If your illness is not likely to be very severe or complicated, you may want to try a walk in clinic. These are popping up all over the country in pharmacies, drugstores, and shopping centers. They're usually staffed by nurse practitioners or physician assistants that have been trained to treat common illnesses and complaints. They're usually fairly quick and inexpensive. However, if you have serious medical issues or chronic medical problems, these are probably not your best option. ° °No Primary Care Doctor: °- Call Health Connect at  832-8000 - they can help you locate a primary care doctor that  accepts your insurance, provides certain services, etc. °- Physician Referral Service- 1-800-533-3463 ° °Chronic Pain Problems: °Organization         Address  Phone   Notes  °[facility]  ([phone]) [phone] Patients need to be referred by their primary care doctor.  ° °Medication  Assistance: °Organization         Address  Phone   Notes  °[place] [facility] [address] °[place], [place] [address] (336) 641-8030 --Must be a resident of [place] °-- Must have NO insurance coverage whatsoever (no Medicaid/ Medicare, etc.) °-- The pt. MUST have a primary care doctor that directs their care regularly and follows them in the community °  °[facility]  (866) 331-1348   °[employer]  (888) 892-1162   ° °Agencies that provide inexpensive medical care: °Organization         Address  Phone   Notes  °[facility]  (336) 832-8035   °[facility]    (336) 832-7272   °[facility] [address] °[place], [place] [address] (336) 832-4777   °[facility] [address], °[place] (336) 271-4999   °[facility]    (336) 373-0678   °[facility]    (336) 272-1050   °[facility] ° [address], [place] Phone:  (336) 832-4444, Fax:  (336) 832-4440 Hours of Operation:  9 am - 6 pm, M-F.  Also accepts Medicaid/Medicare and self-pay.  °[facility] ° [address], [place] Phone: (336) [phone], Fax: (336) [phone]. Hours of Operation:  8:30 am - 5:30 pm, M-F.  Also accepts Medicaid and self-pay.  °[facility] [place] [address], [place] Phone: (336) 878-6027   °[facility] [address], [place], [place] (336)723-1848, Ext. 123 Mondays & Thursdays: 7-9 AM.  First 15 patients are seen on a first come, first serve basis. °  ° °Medicaid-accepting [place] Providers: ° °Organization         Address  Phone   Notes  °[facility] [address], [place] (336) 641-2100 Also accepts self-pay patients.  °[facility] [address], [place] ° (336) 856-9996   °[facility] [address], [place]  (336) 288-8857   °[facility] [address] [place] [address], [place] (336) 299-7000   °Veita Bland [address], [place]  ° (336) 373-1557 Only accepts [place] Access Medicaid patients after they have their name applied to their card.  ° °Self-Pay (no insurance) in [place]: ° °Organization         Address  Phone   Notes  °Sickle Cell Patients, [facility] [address], [place] (336) 832-1970   °[facility] [address], [place] (336) 832-4400   °[facility] ° [address], [place] (336) 992-4800   °[facility]/Dr. Osei-Bonsu ° [address] [place] [address], [place] or [address], [place] (336) [phone] Phone number for both [place] and [place] locations is the same.  °[facility] [address], [place] (336) 299-0000   °[facility] [address] [place] [address], [place] or [address] (336) 852-7530 °(336) 878-2260   °[facility] [address], [place] (336) 350-1642, phone; (336) 294-5005, fax Sees patients 1st and 3rd Saturday of every month.  Must not qualify for public or private insurance (i.e. Medicaid, Medicare, [place] Health Choice, Veterans' Benefits) • Household income should be no more than 200% of the poverty level •The clinic cannot treat you if you are pregnant or think you are pregnant • Sexually transmitted diseases are not treated at the clinic.  ° ° °Dental Care: °Organization         Address  Phone  Notes  °[place] [facility] [address], [place] (336) 641-6152 Accepts children up to age 21 who are enrolled in Medicaid or [place] Health Choice; pregnant women with a Medicaid card; and children who have applied for Medicaid or [place] Health Choice, but were declined, whose parents can pay a reduced fee at time of service.  °[place]  [facility] [place]  [address], [place] (336) 641-7733 Accepts children up to age 21 who are enrolled in Medicaid or [place] Health Choice; pregnant women with a Medicaid card; and children who have applied for Medicaid or [place] Health Choice, but were declined, whose parents can pay a reduced fee at time of service.  °[facility] ° [address], [place] (336) 641-4533 Patients are seen by appointment only. Walk-ins are not accepted. [facility] will see patients 18 years of age and older. °Monday - Tuesday (8am-5pm) °Most Wednesdays (8:30-5pm) °$30 per visit, cash only  °[facility] ° [address], [place] (336) 641-4533 Patients are seen by appointment only. Walk-ins are not accepted. [facility] will see patients 18 years of age and older. °One   Wednesday Evening (Monthly: Volunteer Based).  $30 per visit, cash only  °[facility]  (919) 537-3737 for adults; Children under age 4, call Graduate Pediatric Dentistry at ([phone]) [phone]. Children aged 4-14, please call ([phone]) [phone] to request a pediatric application. ° Dental services are provided in all areas of dental care including fillings, crowns and bridges, complete and partial dentures, implants, gum treatment, root canals, and extractions. Preventive care is also provided. Treatment is provided to both adults and children. °Patients are selected via a lottery and there is often a waiting list. °  °[facility] [address], °[place] ° (336) 763-8833 [email or web address] °  °[facility] [address], [place], [place] (336)723-1848, Ext. 123 Second and Fourth Thursday of each month, opens at 6:30 AM; Clinic ends at 9 AM.  Patients are seen on a first-come first-served basis, and a limited number are seen during each clinic.  ° °[facility] ° [address], [place], [place] (336) 723-7904    Eligibility Requirements °You must have lived in [place], [place], or [place] counties for at least the last three months. °  You cannot be eligible for state or federal sponsored healthcare insurance, including Veterans Administration, Medicaid, or Medicare. °  You generally cannot be eligible for healthcare insurance through your employer.  °  How to apply: °Eligibility screenings are held every Tuesday and Wednesday afternoon from 1:00 pm until 4:00 pm. You do not need an appointment for the interview!  °[facility] [address], [place], [place] 336-631-2330   °[facility]  336-342-8273   °[place] [facility]  336-703-3100   ° [facility]  336-570-6415   ° °Behavioral Health Resources in the Community: °Intensive Outpatient Programs °Organization         Address  Phone  Notes  °[facility] [address], [place], [place] 336-878-6098   °[facility] [address], [place], [place] 336-832-9800   °[facility] [address], [place], [place] ° 336-882-2125   °[facility] [address],  °[place], [place] 1-800-853-5163 or 336-641-4981   °Substance Abuse Resources °Organization         Address  Phone  Notes  °[facility]  336-882-2125   °[facility]  336-784-9470   °[facility]  336-285-9073   °[facility]  336-845-3988   °Residential & Outpatient Substance Abuse Program  1-800-659-3381   °Psychological Services °Organization         Address  Phone  Notes  °[facility]  336- 832-9600   °[facility]  336- 378-7881   °[facility] [address], [place] 1-800-853-5163 or 336-641-4981   ° °Mobile Crisis Teams °Organization         Address  Phone  Notes  °[facility], Mobile Crisis Care Unit  1-877-626-1772   °Assertive °[facility] ° [address]  [place], [place] 336-834-9664   °Sharon DeEsch [address] °[place] 336-554-5454   ° °Self-Help/Support Groups °Organization         Address  Phone             Notes  °[facility] [place] - variety of support groups  336- 373-1402 Call for more information  °Narcotics Anonymous (NA), [facility] [address], °[place]  2 meetings at this location  ° °  Residential Treatment Programs Organization         Address  Phone  Notes  [facility] [address]    [place]  1-(410)105-9251   [facility]  [address] [place] [address], [place], [place]   [place], [place] 727-220-9045 Admissions: 8am-3pm M-F  [facility] [address],    [place], [place] 803-212-2482   [facility] [address] [place], [place], [place]   [facility] [address],  [place], [place]   [facility] Intensive Outpatient [address], [place], [place]   [facility]  [place], [place] 1-417-617-9747 or (940)251-0568   [facility] [address], [place], [place] Accepts Medicaid  [facility] [address]  [place] 1-435-651-5684 Substance Abuse/Addiction Treatment   [facility]         Address  Phone  Notes  [facility]  423-530-3898   Domenic Schwab, PhD [address] [place], [place]   253-114-1179 or 931-088-8977   [place] [address] [place], [place] [phone]   [facility] [address], [place], [place] 226-303-2218 Insurance/Medicaid/sponsorship through [facility] [address] [place]                                    [place], [place] 856-826-6833 [facility] [address], [place] [phone]    Dr. Adele Schilder  [phone]   [facility] Dept. 1) [address], [place] 2) [place] 3)  [address], [place] [phone]  [phone]   [place] (951) 665-8303 or [phone] (After Hours)      Take the prescription as directed.  Call your regular medical doctor tomorrow to schedule a follow up appointment within the next 24 to 48 hours.  Return to the Emergency Department immediately sooner if worsening.

## 2015-05-28 NOTE — ED Provider Notes (Signed)
CSN: [id number]     Arrival date & time 05/28/15  1757 History   First MD Initiated Contact with Patient 05/28/15 1800     Chief Complaint  Patient presents with  . Testicle Pain     (Consider location/radiation/quality/duration/timing/severity/associated sxs/prior Treatment) HPI  The patient is a 74 year old male who presents to the hospital the same day that he was diagnosed with a scrotal cellulitis and left AGAINST MEDICAL ADVICE after being told he should be admitted to the hospital for IV antibiotics. He has had ongoing moderate to severe scrotal pain. He denies any increased swelling or fevers, it is worse with palpation. He was given IV antibiotics during this morning's evaluation  Past Medical History  Diagnosis Date  . Hypertension   . Pancreatitis   . Neuropathy   . Hyperlipemia   . Peripheral vascular disease     a. 10/2013: severely abnormal ABIs, for OP evaluation.  [name] GERD (gastroesophageal reflux disease)   . PUD (peptic ulcer disease)     diagnosed via EGD by Dr. Braulio Bosch at [facility]  . Diverticulosis   . Hemorrhoids   . Coronary artery disease      a. BMS to LAD 1999. b. HSRA to LAD and stent to RCA 2007. c. NSTEMI/CABG x 3 in 04/2010. d. NSTEMI 10/2013 - secondary to demand ischemia in setting of COPD exacerbation with severe underlying CAD - cath with 3/3 patent grafts, for medical therapy.   [name] COPD (chronic obstructive pulmonary disease)     a. Ongoing tobacco abuse.  . Arthritis   . Neutropenia     a. Dx 10/2013 on labs - instructed to f/u PCP.  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   . Ischemic cardiomyopathy     a. previously EF 30% in 2011. b. 10/2013: EF 45-50% by echo.  . Alcoholism     a. Remote alcoholism  . 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  [name] Ulcer of right foot   . Chronic kidney disease   . Sinusitis   . Myocardial  infarction   . Shortness of breath dyspnea   . History of hiatal hernia   . Pneumonia     "a couple times; years ago' (05/03/2015)  . Type II diabetes mellitus   . CHF (congestive heart failure)    Past Surgical History  Procedure Laterality Date  . Coronary artery bypass graft  June 2011    X3  . Inguinal hernia repair Right   . Cholecystectomy      3-4 years ago  . Appendectomy  ~ 1962  . Cataract extraction w/ intraocular lens implant Right   . Colonoscopy  05/12/2002    Dr. [name]- diverticulosis, anal papilla, internal hemorrhoids, rectal polyps  . [name] dilation  04/16/2012    Schatzki's ring-status post dilation and disruption/ as described above. Grade 1 esophageal varices. Small hiatal hernia.  [name] dilation  04/16/2012    Procedure: [name] DILATION;  Surgeon: Daneil Dolin, MD;  Location: [facility];  Service: Endoscopy;  Laterality: N/A;  . Colonoscopy  08/10/2012    Procedure: COLONOSCOPY;  Surgeon: Daneil Dolin, MD;  Location: [facility];  Service: Endoscopy;  Laterality: N/A;  9:45 needs 30 mins extra /have Glucagon on hand  . Left heart catheterization with coronary/graft angiogram N/A 10/29/2013    Procedure: LEFT HEART CATHETERIZATION WITH  [name];  Surgeon: Burnell Blanks, MD;  Location: [facility] CATH LAB;  Service: Cardiovascular;  Laterality: N/A;  . Abdominal aortagram N/A 09/09/2014    Procedure: ABDOMINAL Maxcine Ham;  Surgeon: Elam Dutch, MD;  Location: [facility] CATH LAB;  Service: Cardiovascular;  Laterality: N/A;  . Atherectomy Bilateral 09/23/2014    Procedure: ATHERECTOMY;  Surgeon: Elam Dutch, MD;  Location: [facility] CATH LAB;  Service: Cardiovascular;  Laterality: Bilateral;  iliacs  . Amputation Left 12/05/2014    Procedure: AMPUTATION ABOVE KNEE;  Surgeon: Elam Dutch, MD;  Location: [facility];  Service: Vascular;  Laterality: Left;  [name] Tympanoplasty Right   . 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  Esophagogastroduodenoscopy endoscopy  2003    Dr. Braulio Bosch: erosive reflux esophagitis , PUD  . Colonoscopy  05/2002    hyperplastic polyp, diverticulosis, anal papilla and internal hemorrhoids r  . Amputation Right 05/03/2015    Procedure: RIGHT ABOVE KNEE AMPUTATION;  Surgeon: Elam Dutch, MD;  Location: [facility] OR;  Service: Vascular;  Laterality: Right;   Family History  Problem Relation Age of Onset  . Coronary artery disease Mother   . Coronary artery disease Father   . Diabetes Sister   . Suicidality Brother   . Cirrhosis Brother   . Colon cancer Neg Hx    History  Substance Use Topics  . Smoking status: Current Every Day Smoker -- 1.00 packs/day for 65 years    Types: Cigarettes  . Smokeless tobacco: Former Systems developer    Types: Snuff     Comment: 05/03/2015 "dipped snuff years and years ago"  . Alcohol Use: No     Comment: Quit drinking in 1985    Review of Systems  All other systems reviewed and are negative.     Allergies  Lovaza; Lyrica; Zithromax; and Levaquin  Home Medications   Prior to Admission medications   Medication Sig Start Date End Date Taking? Authorizing Provider  albuterol-ipratropium (COMBIVENT) 18-103 MCG/ACT inhaler Inhale 1-2 puffs into the lungs every 6 (six) hours as needed for wheezing or shortness of breath. 11/02/13   Dayna N Dunn, PA-C  Amino Acids-Protein Hydrolys (FEEDING SUPPLEMENT, PRO-STAT SUGAR FREE 64,) LIQD Take 30 mLs by mouth 3 (three) times daily with meals. 12/09/14   Albertine Patricia, MD  aspirin 325 MG tablet Take 325 mg by mouth daily.    Historical Provider, MD  atorvastatin (LIPITOR) 40 MG tablet Take 40 mg by mouth daily.    Historical Provider, MD  citalopram (CELEXA) 20 MG tablet Take 20 mg by mouth daily.    Historical Provider, MD  clindamycin (CLEOCIN) 150 MG capsule 3 tabs PO TID x 10 days 05/28/15   Francine Graven, DO  doxycycline (VIBRAMYCIN) 100 MG capsule Take 1 capsule (100 mg total) by mouth 2 (two) times daily.  05/23/15   Costin Karlyne Greenspan, MD  fenofibrate 160 MG tablet Take 160 mg by mouth daily with breakfast.     Historical Provider, MD  furosemide (LASIX) 20 MG tablet Take 40 mg by mouth daily.    Historical Provider, MD  HYDROcodone-acetaminophen (NORCO/VICODIN) 5-325 MG per tablet Take 1 tablet by mouth 4 (four) times daily as needed for moderate pain. 05/23/15   Costin Karlyne Greenspan, MD  insulin aspart (NOVOLOG) 100 UNIT/ML injection Inject 0-15 Units into the skin 3 (three) times daily with meals. 12/09/14   Silver Huguenin Elgergawy, MD  insulin glargine (LANTUS) 100 UNIT/ML injection Inject 0.15 mLs (15 Units total) into the  skin daily. Patient taking differently: Inject 10-30 Units into the skin daily as needed (for elevated blood sugar levels).  12/09/14   Albertine Patricia, MD  levalbuterol Penne Lash) 1.25 MG/3ML nebulizer solution Take 0.63 mg by nebulization every 4 (four) hours as needed for wheezing or shortness of breath. 07/14/14   Shanker Kristeen Mans, MD  lisinopril (PRINIVIL,ZESTRIL) 20 MG tablet Take 20 mg by mouth daily.     Historical Provider, MD  loratadine (CLARITIN) 10 MG tablet Take 10 mg by mouth daily.    Historical Provider, MD  metFORMIN (GLUCOPHAGE) 1000 MG tablet Take 2,000 mg by mouth 2 (two) times daily with a meal.     Historical Provider, MD  metolazone (ZAROXOLYN) 2.5 MG tablet TAKE ONE (1) TABLET EACH DAY 10/03/14   Lendon Colonel, NP  metoprolol tartrate (LOPRESSOR) 25 MG tablet Take 25 mg by mouth 2 (two) times daily.     Historical Provider, MD  nitroGLYCERIN (NITROSTAT) 0.4 MG SL tablet Place 0.4 mg under the tongue every 5 (five) minutes x 3 doses as needed for chest pain.     Historical Provider, MD  OXYGEN Inhale 3 L into the lungs daily as needed (shortess of breath).    Historical Provider, MD  Pancrelipase, Lip-Prot-Amyl, (CREON) 24000 UNITS CPEP Take 48,000-72,000 Units by mouth See admin instructions. Take 72000 units 3 times daily with all meals and take 48000 units with  snacks    Historical Provider, MD  SYMBICORT 160-4.5 MCG/ACT inhaler Inhale 1-2 puffs into the lungs 2 (two) times daily as needed (shorntess of breath).  07/04/12   Historical Provider, MD  tamsulosin (FLOMAX) 0.4 MG CAPS capsule Take 0.4 mg by mouth at bedtime.    Historical Provider, MD  traZODone (DESYREL) 50 MG tablet Take 50 mg by mouth at bedtime as needed for sleep.     Historical Provider, MD   BP 121/68 mmHg  Pulse 86  Temp(Src) 99.1 F (37.3 C) (Oral)  Ht   Wt 120 lb (54.432 kg)  SpO2 98% Physical Exam  Constitutional: He appears well-developed and well-nourished. No distress.  HENT:  Head: Normocephalic and atraumatic.  Mouth/Throat: Oropharynx is clear and moist. No oropharyngeal exudate.  Eyes: Conjunctivae and EOM are normal. Pupils are equal, round, and reactive to light. Right eye exhibits no discharge. Left eye exhibits no discharge. No scleral icterus.  Neck: Normal range of motion. Neck supple. No JVD present. No thyromegaly present.  Cardiovascular: Normal rate, regular rhythm, normal heart sounds and intact distal pulses.  Exam reveals no gallop and no friction rub.   No murmur heard. Pulmonary/Chest: Effort normal and breath sounds normal. No respiratory distress. He has no wheezes. He has no rales.  Abdominal: Soft. Bowel sounds are normal. He exhibits no distension and no mass. There is no tenderness.  Genitourinary:  Normal appearing penis scrotum and testicles other than significant redness and warmth to the diffuse scrotal  Musculoskeletal: Normal range of motion. He exhibits tenderness. He exhibits no edema.  Bilateral lower extremity amputations, minimal tenderness over the right stump  Lymphadenopathy:    He has no cervical adenopathy.  Neurological: He is alert. Coordination normal.  Skin: Skin is warm and dry. No rash noted. No erythema.  Psychiatric: He has a normal mood and affect. His behavior is normal.  Nursing note and vitals reviewed.   ED  Course  Procedures (including critical care time) Labs Review Labs Reviewed - No data to display  Imaging Review US Scrotum  05/28/2015  CLINICAL DATA:  Bilateral testicular pain and swelling for 2 months. Rash.  EXAM: SCROTAL ULTRASOUND  DOPPLER ULTRASOUND OF THE TESTICLES  TECHNIQUE: Complete ultrasound examination of the testicles, epididymis, and other scrotal structures was performed. Color and spectral Doppler ultrasound were also utilized to evaluate blood flow to the testicles.  COMPARISON:  None.  FINDINGS: Right testicle  Measurements: 4.6 x 2.9 x 3.2 cm. No mass or microlithiasis visualized.  Left testicle  Measurements: 3.7 x 2.1 x 3.2 cm. No mass or microlithiasis visualized.  Pulsed Doppler interrogation of both testes demonstrates normal low resistance arterial and venous waveforms bilaterally.  Right epididymis: Heterogeneous echotexture without enlargement or increased vascularity.  Left epididymis: Heterogeneous echotexture without enlargement or increased vascularity.  Hydrocele:  Large bilateral hydroceles with some debris.  Varicocele: None.  IMPRESSION: 1. No evidence of testicular torsion. 2. Large bilateral hydroceles. 3. Heterogeneous, nonenlarged epididymis bilaterally without evidence of acute epididymitis. No mass.   Electronically Signed   By: Logan Bores   On: 05/28/2015 13:28   [place] Art/ven Flow Abd Pelv Doppler Limited  05/28/2015   CLINICAL DATA:  Bilateral testicular pain and swelling for 2 months. Rash.  EXAM: SCROTAL ULTRASOUND  DOPPLER ULTRASOUND OF THE TESTICLES  TECHNIQUE: Complete ultrasound examination of the testicles, epididymis, and other scrotal structures was performed. Color and spectral Doppler ultrasound were also utilized to evaluate blood flow to the testicles.  COMPARISON:  None.  FINDINGS: Right testicle  Measurements: 4.6 x 2.9 x 3.2 cm. No mass or microlithiasis visualized.  Left testicle  Measurements: 3.7 x 2.1 x 3.2 cm. No mass or microlithiasis  visualized.  Pulsed Doppler interrogation of both testes demonstrates normal low resistance arterial and venous waveforms bilaterally.  Right epididymis: Heterogeneous echotexture without enlargement or increased vascularity.  Left epididymis: Heterogeneous echotexture without enlargement or increased vascularity.  Hydrocele:  Large bilateral hydroceles with some debris.  Varicocele: None.  IMPRESSION: 1. No evidence of testicular torsion. 2. Large bilateral hydroceles. 3. Heterogeneous, nonenlarged epididymis bilaterally without evidence of acute epididymitis. No mass.   Electronically Signed   By: Logan Bores   On: 05/28/2015 13:28   Dg Chest Port 1 View  05/28/2015   CLINICAL DATA:  Shortness of breath.  Testicular pain and swelling.  EXAM: PORTABLE CHEST - 1 VIEW  COMPARISON:  Single view of the chest 05/19/2015.  FINDINGS: Bibasilar airspace disease seen on the comparison examination is markedly improved. There is a small left pleural effusion. No right effusion is seen. Heart size is upper normal. The patient is status post CABG.  IMPRESSION: Improved bibasilar airspace disease. Small left effusion is noted. No new abnormality.   Electronically Signed   By: Inge Rise M.D.   On: 05/28/2015 11:36     EKG Interpretation None      MDM   Final diagnoses:  Cellulitis of scrotum    The patient has a scrotal cellulitis, there is no other findings of concern, he is well-appearing and his vital signs are unremarkable, discussed with hospitalist will admit for IV antibiotics.    Noemi Chapel, MD 05/28/15 [phone]

## 2015-05-28 NOTE — H&P (Signed)
[facility] History and Physical  Stephen [name] TML:[id number] DOB: 1941-06-18 DOA: 05/28/2015  Referring physician: ER, Dr. Sabra Heck PCP: [name], PA-C   Chief Complaint: Scrotal pain and swelling  HPI: Stephen [name] is a 74 y.o. male  This is a 74 year old man, diabetic, presents to the hospital with a two-week history of swelling, redness and pain in the scrotum. He was seen earlier in the emergency room today and was diagnosed with scrotal cellulitis and was apparently due to be admitted to the hospital but left [place]. However, he now comes back and is willing to stay in the hospital. He denies any fever, chills. He does not recollect any trauma, bites to his scrotal area. Evaluation in the emergency room shows him to have scrotal cellulitis. Ultrasound of the scrotum does not show any epididymitis or orchitis underlying. It does show bilateral hydroceles. He is now being admitted for further management.   Review of Systems:  Apart from symptoms above, all systems negative.  Past Medical History  Diagnosis Date  . Hypertension   . Pancreatitis   . Neuropathy   . Hyperlipemia   . Peripheral vascular disease     a. 10/2013: severely abnormal ABIs, for OP evaluation.  [name] GERD (gastroesophageal reflux disease)   . PUD (peptic ulcer disease)     diagnosed via EGD by Dr. Braulio Bosch at [place]  . Diverticulosis   . Hemorrhoids   . Coronary artery disease      a. BMS to LAD 1999. b. HSRA to LAD and stent to RCA 2007. c. NSTEMI/CABG x 3 in 04/2010. d. NSTEMI 10/2013 - secondary to demand ischemia in setting of COPD exacerbation with severe underlying CAD - cath with 3/3 patent grafts, for medical therapy.   [name] COPD (chronic obstructive pulmonary disease)     a. Ongoing tobacco abuse.  . Arthritis   . Neutropenia     a. Dx 10/2013 on labs - instructed to f/u PCP.  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   . Ischemic cardiomyopathy     a. previously EF 30% in 2011. b. 10/2013: EF 45-50% by echo.  . Alcoholism     a. Remote alcoholism  . 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  [name] Ulcer of right foot   . Chronic kidney disease   . Sinusitis   . Myocardial infarction   . Shortness of breath dyspnea   . History of hiatal hernia   . Pneumonia     "a couple times; years ago' (05/03/2015)  . Type II diabetes mellitus   . CHF (congestive heart failure)    Past Surgical History  Procedure Laterality Date  . Coronary artery bypass graft  June 2011    X3  . Inguinal hernia repair Right   . Cholecystectomy      3-4 years ago  . Appendectomy  ~ 1962  . Cataract extraction w/ intraocular lens implant Right   . Colonoscopy  05/12/2002    Dr. [name]- diverticulosis, anal papilla, internal hemorrhoids, rectal polyps  . [name] dilation  04/16/2012    Schatzki's ring-status post dilation and disruption/ as described above. Grade 1 esophageal varices. Small hiatal hernia.  [name] dilation  04/16/2012    Procedure: [name] DILATION;  Surgeon: Daneil Dolin, MD;  Location: [facility];  Service: Endoscopy;  Laterality:  N/A;  . Colonoscopy  08/10/2012    Procedure: COLONOSCOPY;  Surgeon: Daneil Dolin, MD;  Location: [facility];  Service: Endoscopy;  Laterality: N/A;  9:45 needs 30 mins extra /have Glucagon on hand  . Left heart catheterization with coronary/graft angiogram N/A 10/29/2013    Procedure: LEFT HEART CATHETERIZATION WITH [name];  Surgeon: Burnell Blanks, MD;  Location: [facility] CATH LAB;  Service: Cardiovascular;  Laterality: N/A;  . Abdominal aortagram N/A 09/09/2014    Procedure: ABDOMINAL Maxcine Ham;  Surgeon: Elam Dutch, MD;  Location: [facility] CATH LAB;  Service: Cardiovascular;  Laterality: N/A;  . Atherectomy Bilateral 09/23/2014    Procedure: ATHERECTOMY;  Surgeon: Elam Dutch, MD;  Location:  [facility] CATH LAB;  Service: Cardiovascular;  Laterality: Bilateral;  iliacs  . Amputation Left 12/05/2014    Procedure: AMPUTATION ABOVE KNEE;  Surgeon: Elam Dutch, MD;  Location: [place];  Service: Vascular;  Laterality: Left;  [name] Tympanoplasty Right   . 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 Esophagogastroduodenoscopy endoscopy  2003    Dr. Braulio Bosch: erosive reflux esophagitis , PUD  . Colonoscopy  05/2002    hyperplastic polyp, diverticulosis, anal papilla and internal hemorrhoids r  . Amputation Right 05/03/2015    Procedure: RIGHT ABOVE KNEE AMPUTATION;  Surgeon: Elam Dutch, MD;  Location: [place];  Service: Vascular;  Laterality: Right;   Social History:  reports that he has been smoking Cigarettes.  He has a 65 pack-year smoking history. He has quit using smokeless tobacco. His smokeless tobacco use included Snuff. He reports that he does not drink alcohol or use illicit drugs.  Allergies  Allergen Reactions  . Lovaza [Omega-3-Acid Ethyl Esters] Itching  . Lyrica [Pregabalin] Hives  . Zithromax [Azithromycin]     rash  . Levaquin [Levofloxacin] Hives    Family History  Problem Relation Age of Onset  . Coronary artery disease Mother   . Coronary artery disease Father   . Diabetes Sister   . Suicidality Brother   . Cirrhosis Brother   . Colon cancer Neg Hx     Prior to Admission medications   Medication Sig Start Date End Date Taking? Authorizing Provider  albuterol-ipratropium (COMBIVENT) 18-103 MCG/ACT inhaler Inhale 1-2 puffs into the lungs every 6 (six) hours as needed for wheezing or shortness of breath. 11/02/13  Yes Dayna N Dunn, PA-C  aspirin 325 MG tablet Take 325 mg by mouth daily.   Yes Historical Provider, MD  atorvastatin (LIPITOR) 40 MG tablet Take 40 mg by mouth daily.   Yes Historical Provider, MD  doxycycline (VIBRAMYCIN) 100 MG capsule Take 1 capsule (100 mg total) by mouth 2 (two) times daily. 05/23/15  Yes Costin Karlyne Greenspan, MD  fenofibrate 160 MG  tablet Take 160 mg by mouth daily with breakfast.    Yes Historical Provider, MD  furosemide (LASIX) 20 MG tablet Take 40 mg by mouth daily.   Yes Historical Provider, MD  HYDROcodone-acetaminophen (NORCO/VICODIN) 5-325 MG per tablet Take 1 tablet by mouth 4 (four) times daily as needed for moderate pain. 05/23/15  Yes Costin Karlyne Greenspan, MD  insulin aspart (NOVOLOG) 100 UNIT/ML injection Inject 0-15 Units into the skin 3 (three) times daily with meals. 12/09/14  Yes Albertine Patricia, MD  insulin glargine (LANTUS) 100 UNIT/ML injection Inject 0.15 mLs (15 Units total) into the skin daily. Patient taking differently: Inject 10-30 Units into the skin daily as needed (for elevated blood sugar levels).  12/09/14  Yes Dawood  S Elgergawy, MD  levalbuterol (XOPENEX) 1.25 MG/3ML nebulizer solution Take 0.63 mg by nebulization every 4 (four) hours as needed for wheezing or shortness of breath. 07/14/14  Yes Shanker Kristeen Mans, MD  lisinopril (PRINIVIL,ZESTRIL) 20 MG tablet Take 20 mg by mouth daily.    Yes Historical Provider, MD  loratadine (CLARITIN) 10 MG tablet Take 10 mg by mouth daily.   Yes Historical Provider, MD  metFORMIN (GLUCOPHAGE) 1000 MG tablet Take 1,000 mg by mouth 2 (two) times daily with a meal.    Yes Historical Provider, MD  metolazone (ZAROXOLYN) 2.5 MG tablet TAKE ONE (1) TABLET EACH DAY 10/03/14  Yes Lendon Colonel, NP  metoprolol tartrate (LOPRESSOR) 25 MG tablet Take 25 mg by mouth 2 (two) times daily.    Yes Historical Provider, MD  OXYGEN Inhale 3 L into the lungs daily as needed (shortess of breath).   Yes Historical Provider, MD  Pancrelipase, Lip-Prot-Amyl, (CREON) 24000 UNITS CPEP Take 48,000-72,000 Units by mouth See admin instructions. Take 72000 units 3 times daily with all meals and take 48000 units with snacks   Yes Historical Provider, MD  tamsulosin (FLOMAX) 0.4 MG CAPS capsule Take 0.4 mg by mouth at bedtime.   Yes Historical Provider, MD  traZODone (DESYREL) 50 MG tablet  Take 50 mg by mouth at bedtime as needed for sleep.    Yes Historical Provider, MD  Amino Acids-Protein Hydrolys (FEEDING SUPPLEMENT, PRO-STAT SUGAR FREE 64,) LIQD Take 30 mLs by mouth 3 (three) times daily with meals. Patient not taking: Reported on 05/28/2015 12/09/14   Albertine Patricia, MD  clindamycin (CLEOCIN) 150 MG capsule 3 tabs PO TID x 10 days Patient not taking: Reported on 05/28/2015 05/28/15   Francine Graven, DO  nitroGLYCERIN (NITROSTAT) 0.4 MG SL tablet Place 0.4 mg under the tongue every 5 (five) minutes x 3 doses as needed for chest pain.     Historical Provider, MD  SYMBICORT 160-4.5 MCG/ACT inhaler Inhale 1-2 puffs into the lungs 2 (two) times daily as needed (shorntess of breath).  07/04/12   Historical Provider, MD   Physical Exam: Filed Vitals:   05/28/15 1800 05/28/15 1908  BP: 121/68 158/55  Pulse: 88 79  Temp: 99.1 F (37.3 C) 98.1 F (36.7 C)  TempSrc: Oral Oral  Resp:  20  Weight: 54.432 kg (120 lb)   SpO2: 98% 93%    Wt Readings from Last 3 Encounters:  05/28/15 54.432 kg (120 lb)  05/28/15 54.432 kg (120 lb)  05/22/15 54.1 kg (119 lb 4.3 oz)    General:  Appears calm and comfortable. He does not look toxic or septic. He is afebrile in the emergency room at the present time.  Eyes: PERRL, normal lids, irises & conjunctiva ENT: grossly normal hearing, lips & tongue Neck: no LAD, masses or thyromegaly Cardiovascular: RRR, no m/r/g. No LE edema. Telemetry: SR, no arrhythmias  Respiratory: CTA bilaterally, no w/r/r. Normal respiratory effort. Abdomen: soft, ntnd. He does have scrotal cellulitis.  Skin: no rash or induration seen on limited exam Musculoskeletal: grossly normal tone BUE/BLE Psychiatric: grossly normal mood and affect, speech fluent and appropriate Neurologic: grossly non-focal.          Labs on Admission:  Basic Metabolic Panel:  Recent Labs Lab 05/22/15 0417 05/28/15 1038  NA 133* 130*  K 3.4* 3.1*  CL 90* 92*  CO2 35* 31    GLUCOSE 225* 356*  BUN 27* 14  CREATININE 0.69 0.67  CALCIUM 7.7* 7.7*  Liver Function Tests:  Recent Labs Lab 05/28/15 1038  AST 16  ALT 8*  ALKPHOS 70  BILITOT 0.3  PROT 5.7*  ALBUMIN 1.8*   No results for input(s): LIPASE, AMYLASE in the last 168 hours. No results for input(s): AMMONIA in the last 168 hours. CBC:  Recent Labs Lab 05/22/15 0417 05/28/15 1038  WBC 8.1 7.7  NEUTROABS  --  6.3  HGB 8.9* 9.4*  HCT 26.4* 28.0*  MCV 86.3 86.2  PLT 469* 399   Cardiac Enzymes: No results for input(s): CKTOTAL, CKMB, CKMBINDEX, TROPONINI in the last 168 hours.  BNP (last 3 results)  Recent Labs  02/10/15 1706 05/19/15 2310 05/28/15 1038  BNP 848.5* 432.0* 792.0*    ProBNP (last 3 results)  Recent Labs  07/21/14 2348 08/16/14 1351 08/17/14 0609  PROBNP 3366.0* 6508.0* 9230.0*    CBG:  Recent Labs Lab 05/22/15 1703 05/22/15 2201 05/23/15 0742 05/23/15 0825 05/23/15 1147  GLUCAP 293* 346* 68 93 150*    Radiological Exams on Admission: US Scrotum  05/28/2015   CLINICAL DATA:  Bilateral testicular pain and swelling for 2 months. Rash.  EXAM: SCROTAL ULTRASOUND  DOPPLER ULTRASOUND OF THE TESTICLES  TECHNIQUE: Complete ultrasound examination of the testicles, epididymis, and other scrotal structures was performed. Color and spectral Doppler ultrasound were also utilized to evaluate blood flow to the testicles.  COMPARISON:  None.  FINDINGS: Right testicle  Measurements: 4.6 x 2.9 x 3.2 cm. No mass or microlithiasis visualized.  Left testicle  Measurements: 3.7 x 2.1 x 3.2 cm. No mass or microlithiasis visualized.  Pulsed Doppler interrogation of both testes demonstrates normal low resistance arterial and venous waveforms bilaterally.  Right epididymis: Heterogeneous echotexture without enlargement or increased vascularity.  Left epididymis: Heterogeneous echotexture without enlargement or increased vascularity.  Hydrocele:  Large bilateral hydroceles with  some debris.  Varicocele: None.  IMPRESSION: 1. No evidence of testicular torsion. 2. Large bilateral hydroceles. 3. Heterogeneous, nonenlarged epididymis bilaterally without evidence of acute epididymitis. No mass.   Electronically Signed   By: Logan Bores   On: 05/28/2015 13:28   [place] Art/ven Flow Abd Pelv Doppler Limited  05/28/2015   CLINICAL DATA:  Bilateral testicular pain and swelling for 2 months. Rash.  EXAM: SCROTAL ULTRASOUND  DOPPLER ULTRASOUND OF THE TESTICLES  TECHNIQUE: Complete ultrasound examination of the testicles, epididymis, and other scrotal structures was performed. Color and spectral Doppler ultrasound were also utilized to evaluate blood flow to the testicles.  COMPARISON:  None.  FINDINGS: Right testicle  Measurements: 4.6 x 2.9 x 3.2 cm. No mass or microlithiasis visualized.  Left testicle  Measurements: 3.7 x 2.1 x 3.2 cm. No mass or microlithiasis visualized.  Pulsed Doppler interrogation of both testes demonstrates normal low resistance arterial and venous waveforms bilaterally.  Right epididymis: Heterogeneous echotexture without enlargement or increased vascularity.  Left epididymis: Heterogeneous echotexture without enlargement or increased vascularity.  Hydrocele:  Large bilateral hydroceles with some debris.  Varicocele: None.  IMPRESSION: 1. No evidence of testicular torsion. 2. Large bilateral hydroceles. 3. Heterogeneous, nonenlarged epididymis bilaterally without evidence of acute epididymitis. No mass.   Electronically Signed   By: Logan Bores   On: 05/28/2015 13:28   Dg Chest Port 1 View  05/28/2015   CLINICAL DATA:  Shortness of breath.  Testicular pain and swelling.  EXAM: PORTABLE CHEST - 1 VIEW  COMPARISON:  Single view of the chest 05/19/2015.  FINDINGS: Bibasilar airspace disease seen on the comparison examination is markedly improved. There  is a small left pleural effusion. No right effusion is seen. Heart size is upper normal. The patient is status post CABG.   IMPRESSION: Improved bibasilar airspace disease. Small left effusion is noted. No new abnormality.   Electronically Signed   By: Inge Rise M.D.   On: 05/28/2015 11:36      Assessment/Plan   1. Scrotal cellulitis. He will be admitted for intravenous antibiotics. 2. Diabetes. Continue with home medications and sliding scale of insulin. 3. Hypertension. Controlled, stable. 4. Ischemic cardiomyopathy. Stable.   Further recommendations will depend on patient's hospital progress.  Code Status: Full code.   DVT Prophylaxis: Lovenox.   Family Communication: I discussed the plan with the patient at the bedside.    Disposition Plan: home when medically stable.   Time spent: 45 minutes.   Doree Albee [facility] Pager [phone].

## 2015-05-28 NOTE — ED Notes (Signed)
PT c/o testicular pain and swelling x1 week. Testicles edematous and erythema present. PT denies any difficulty with urination. PT does c/o some increased SOB on exertion.

## 2015-05-28 NOTE — Progress Notes (Signed)
ANTIBIOTIC CONSULT NOTE - INITIAL  Pharmacy Consult for Vancomycin & Zosyn Indication: cellulitis  Allergies  Allergen Reactions  . Lovaza [Omega-3-Acid Ethyl Esters] Itching  . Lyrica [Pregabalin] Hives  . Zithromax [Azithromycin]     rash  . Levaquin [Levofloxacin] Hives    Patient Measurements: Height:  (pt bilateral amputee says used to be 70in) Weight: 120 lb (54.432 kg)  Vital Signs:  Intake/Output from previous day:   Intake/Output from this shift:    Labs:  Recent Labs  05/28/15 1038  WBC 7.7  HGB 9.4*  PLT 399  CREATININE 0.67   Estimated Creatinine Clearance: 64.2 mL/min (by C-G formula based on Cr of 0.67). No results for input(s): VANCOTROUGH, VANCOPEAK, VANCORANDOM, GENTTROUGH, GENTPEAK, GENTRANDOM, TOBRATROUGH, TOBRAPEAK, TOBRARND, AMIKACINPEAK, AMIKACINTROU, AMIKACIN in the last 72 hours.   Microbiology: Recent Results (from the past 720 hour(s))  Culture, blood (routine x 2)     Status: None   Collection Time: 05/19/15 11:10 PM  Result Value Ref Range Status   Specimen Description BLOOD RIGHT ARM DRAWN BY RN  Final   Special Requests BOTTLES DRAWN AEROBIC AND ANAEROBIC [place]  Final   Culture NO GROWTH 5 DAYS  Final   Report Status 05/24/2015 FINAL  Final  Culture, blood (routine x 2)     Status: None   Collection Time: 05/19/15 11:33 PM  Result Value Ref Range Status   Specimen Description RIGHT ANTECUBITAL  Final   Special Requests BOTTLES DRAWN AEROBIC AND ANAEROBIC 6CC  Final   Culture NO GROWTH 5 DAYS  Final   Report Status 05/24/2015 FINAL  Final  MRSA PCR Screening     Status: None   Collection Time: 05/20/15  1:22 AM  Result Value Ref Range Status   MRSA by PCR NEGATIVE NEGATIVE Final    Comment:        The GeneXpert MRSA Assay (FDA approved for NASAL specimens only), is one component of a comprehensive MRSA colonization surveillance program. It is not intended to diagnose MRSA infection nor to guide or monitor treatment for MRSA  infections.   Culture, respiratory (NON-Expectorated)     Status: None   Collection Time: 05/21/15  3:30 AM  Result Value Ref Range Status   Specimen Description SPUTUM  Final   Special Requests NONE  Final   Gram Stain   Final    MODERATE WBC PRESENT,BOTH PMN AND MONONUCLEAR RARE SQUAMOUS EPITHELIAL CELLS PRESENT NO ORGANISMS SEEN Performed at [facility]    Culture   Final    RARE HAEMOPHILUS INFLUENZAE Note: BETA LACTAMASE NEGATIVE Performed at [facility]    Report Status 05/25/2015 FINAL  Final    Medical History: Past Medical History  Diagnosis Date  . Hypertension   . Pancreatitis   . Neuropathy   . Hyperlipemia   . Peripheral vascular disease     a. 10/2013: severely abnormal ABIs, for OP evaluation.  [name] GERD (gastroesophageal reflux disease)   . PUD (peptic ulcer disease)     diagnosed via EGD by Dr. Braulio Bosch at [place]  . Diverticulosis   . Hemorrhoids   . Coronary artery disease      a. BMS to LAD 1999. b. HSRA to LAD and stent to RCA 2007. c. NSTEMI/CABG x 3 in 04/2010. d. NSTEMI 10/2013 - secondary to demand ischemia in setting of COPD exacerbation with severe underlying CAD - cath with 3/3 patent grafts, for medical therapy.   [name] COPD (chronic obstructive pulmonary disease)  a. Ongoing tobacco abuse.  . Arthritis   . Neutropenia     a. Dx 10/2013 on labs - instructed to f/u PCP.  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   . Ischemic cardiomyopathy     a. previously EF 30% in 2011. b. 10/2013: EF 45-50% by echo.  . Alcoholism     a. Remote alcoholism  . 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  [name] Ulcer of right foot   . Chronic kidney disease   . Sinusitis   . Myocardial infarction   . Shortness of breath dyspnea   . History of hiatal hernia   . Pneumonia     "a couple times; years ago' (05/03/2015)  . Type II  diabetes mellitus   . CHF (congestive heart failure)     Medications:  Scheduled:  . aspirin  325 mg Oral Daily  . atorvastatin  40 mg Oral Daily  . enoxaparin (LOVENOX) injection  40 mg Subcutaneous Q24H  . [START ON 05/29/2015] fenofibrate  160 mg Oral Q breakfast  . furosemide  40 mg Oral Daily  . [START ON 05/29/2015] insulin aspart  0-15 Units Subcutaneous TID WC  . insulin aspart  0-5 Units Subcutaneous QHS  . insulin glargine  15 Units Subcutaneous Daily  . lipase/protease/amylase  48,000-72,000 Units Oral See admin instructions  . lisinopril  20 mg Oral Daily  . loratadine  10 mg Oral Daily  . [START ON 05/29/2015] metFORMIN  1,000 mg Oral BID WC  . metolazone  2.5 mg Oral Daily  . metoprolol tartrate  25 mg Oral BID  . tamsulosin  0.4 mg Oral QHS   Assessment: Scrotal cellulitis.  CrCl 64.2 ml/min  Goal of Therapy:  Vancomycin trough level 10-15 mcg/ml  Plan:  Vancomycin 1500 mg IV every 24 hours Zosyn 3.375 GM IV every 8 hours Vancomycin tough at steady state Labs per protocol Stephen [name], Stephen [name] 05/28/2015,7:57 PM

## 2015-05-28 NOTE — ED Notes (Signed)
Pt was here earlier today and was going to be admitted for scrotal pain but pt refused admission.  Pt went home and called ems.  Says he changed his mind because he is burning in his scrotum and was feeling SOB.  Pt says is on 3liters of oxygen prn and didn't have it on. Pt presently not on 02.  Room air 02 sat 93%, placed pt on 3 liters.

## 2015-05-28 NOTE — ED Provider Notes (Signed)
CSN: [id number]     Arrival date & time 05/28/15  1009 History   First MD Initiated Contact with Patient 05/28/15 1131     Chief Complaint  Patient presents with  . Groin Swelling  . Rash      HPI Pt was seen at 1130. Per pt, c/o gradual onset and worsening of persistent testicular "pain and swelling" for the past 1 week. Pt states he also feels SOB when he exerts himself for the past 1 week. Pt states he has been taking doxycycline for the past week "for something about my amputations" but completed the course "2 or 3 days ago." Denies dysuria/hematuria, no perineal pain, no abd pain, no back/flank pain, no CP.    Past Medical History  Diagnosis Date  . Hypertension   . Pancreatitis   . Neuropathy   . Hyperlipemia   . Peripheral vascular disease     a. 10/2013: severely abnormal ABIs, for OP evaluation.  [name] GERD (gastroesophageal reflux disease)   . PUD (peptic ulcer disease)     diagnosed via EGD by Dr. Braulio Bosch at [place]  . Diverticulosis   . Hemorrhoids   . Coronary artery disease      a. BMS to LAD 1999. b. HSRA to LAD and stent to RCA 2007. c. NSTEMI/CABG x 3 in 04/2010. d. NSTEMI 10/2013 - secondary to demand ischemia in setting of COPD exacerbation with severe underlying CAD - cath with 3/3 patent grafts, for medical therapy.   [name] COPD (chronic obstructive pulmonary disease)     a. Ongoing tobacco abuse.  . Arthritis   . Neutropenia     a. Dx 10/2013 on labs - instructed to f/u PCP.  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   . Ischemic cardiomyopathy     a. previously EF 30% in 2011. b. 10/2013: EF 45-50% by echo.  . Alcoholism     a. Remote alcoholism  . 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  [name] Ulcer of right foot   . Chronic kidney disease   . Sinusitis   . Myocardial infarction   . Shortness of breath dyspnea   . History of hiatal hernia   .  Pneumonia     "a couple times; years ago' (05/03/2015)  . Type II diabetes mellitus   . CHF (congestive heart failure)    Past Surgical History  Procedure Laterality Date  . Coronary artery bypass graft  June 2011    X3  . Inguinal hernia repair Right   . Cholecystectomy      3-4 years ago  . Appendectomy  ~ 1962  . Cataract extraction w/ intraocular lens implant Right   . Colonoscopy  05/12/2002    Dr. [name]- diverticulosis, anal papilla, internal hemorrhoids, rectal polyps  . [name] dilation  04/16/2012    Schatzki's ring-status post dilation and disruption/ as described above. Grade 1 esophageal varices. Small hiatal hernia.  [name] dilation  04/16/2012    Procedure: [name] DILATION;  Surgeon: Daneil Dolin, MD;  [facility];  Service: Endoscopy;  Laterality: N/A;  . Colonoscopy  08/10/2012    Procedure: COLONOSCOPY;  Surgeon: Daneil Dolin, MD;  Location: [facility];  Service: Endoscopy;  Laterality: N/A;  9:45 needs 30 mins extra /have Glucagon on hand  . Left heart catheterization with coronary/graft angiogram N/A 10/29/2013  Procedure: LEFT HEART CATHETERIZATION WITH [name];  Surgeon: Burnell Blanks, MD;  Location: [facility] CATH LAB;  Service: Cardiovascular;  Laterality: N/A;  . Abdominal aortagram N/A 09/09/2014    Procedure: ABDOMINAL Maxcine Ham;  Surgeon: Elam Dutch, MD;  Location: [facility] CATH LAB;  Service: Cardiovascular;  Laterality: N/A;  . Atherectomy Bilateral 09/23/2014    Procedure: ATHERECTOMY;  Surgeon: Elam Dutch, MD;  Location: [facility] CATH LAB;  Service: Cardiovascular;  Laterality: Bilateral;  iliacs  . Amputation Left 12/05/2014    Procedure: AMPUTATION ABOVE KNEE;  Surgeon: Elam Dutch, MD;  Location: [place];  Service: Vascular;  Laterality: Left;  [name] Tympanoplasty Right   . 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 Esophagogastroduodenoscopy endoscopy  2003    Dr. Braulio Bosch: erosive reflux esophagitis , PUD  .  Colonoscopy  05/2002    hyperplastic polyp, diverticulosis, anal papilla and internal hemorrhoids r  . Amputation Right 05/03/2015    Procedure: RIGHT ABOVE KNEE AMPUTATION;  Surgeon: Elam Dutch, MD;  Location: [facility] OR;  Service: Vascular;  Laterality: Right;   Family History  Problem Relation Age of Onset  . Coronary artery disease Mother   . Coronary artery disease Father   . Diabetes Sister   . Suicidality Brother   . Cirrhosis Brother   . Colon cancer Neg Hx    History  Substance Use Topics  . Smoking status: Current Every Day Smoker -- 1.00 packs/day for 65 years    Types: Cigarettes  . Smokeless tobacco: Former Systems developer    Types: Snuff     Comment: 05/03/2015 "dipped snuff years and years ago"  . Alcohol Use: No     Comment: Quit drinking in 1985    Review of Systems ROS: Statement: All systems negative except as marked or noted in the HPI; Constitutional: Negative for fever and chills. ; ; Eyes: Negative for eye pain, redness and discharge. ; ; ENMT: Negative for ear pain, hoarseness, nasal congestion, sinus pressure and sore throat. ; ; Cardiovascular: Negative for chest pain, palpitations, diaphoresis, dyspnea and peripheral edema. ; ; Respiratory: Negative for cough, wheezing and stridor. ; ; Gastrointestinal: Negative for nausea, vomiting, diarrhea, abdominal pain, blood in stool, hematemesis, jaundice and rectal bleeding. . ; ; Genitourinary: Negative for dysuria, flank pain and hematuria. ; ; Genital:  No penile drainage, +penile rash. No testicular pain. +scrotal rash and swelling. ;; Musculoskeletal: Negative for back pain and neck pain. Negative for swelling and trauma.; ; Skin: Negative for pruritus, rash, abrasions, blisters, bruising and skin lesion.; ; Neuro: Negative for headache, lightheadedness and neck stiffness. Negative for weakness, altered level of consciousness , altered mental status, extremity weakness, paresthesias, involuntary movement, seizure and syncope.      Allergies  Lovaza; Lyrica; Zithromax; and Levaquin  Home Medications   Prior to Admission medications   Medication Sig Start Date End Date Taking? Authorizing Provider  albuterol-ipratropium (COMBIVENT) 18-103 MCG/ACT inhaler Inhale 1-2 puffs into the lungs every 6 (six) hours as needed for wheezing or shortness of breath. 11/02/13  Yes Dayna N Dunn, PA-C  Amino Acids-Protein Hydrolys (FEEDING SUPPLEMENT, PRO-STAT SUGAR FREE 64,) LIQD Take 30 mLs by mouth 3 (three) times daily with meals. 12/09/14  Yes Albertine Patricia, MD  aspirin 325 MG tablet Take 325 mg by mouth daily.   Yes Historical Provider, MD  atorvastatin (LIPITOR) 40 MG tablet Take 40 mg by mouth daily.   Yes Historical Provider, MD  citalopram (CELEXA) 20 MG tablet Take  20 mg by mouth daily.   Yes Historical Provider, MD  doxycycline (VIBRAMYCIN) 100 MG capsule Take 1 capsule (100 mg total) by mouth 2 (two) times daily. 05/23/15  Yes Costin Karlyne Greenspan, MD  fenofibrate 160 MG tablet Take 160 mg by mouth daily with breakfast.    Yes Historical Provider, MD  furosemide (LASIX) 20 MG tablet Take 40 mg by mouth daily.   Yes Historical Provider, MD  HYDROcodone-acetaminophen (NORCO/VICODIN) 5-325 MG per tablet Take 1 tablet by mouth 4 (four) times daily as needed for moderate pain. 05/23/15  Yes Costin Karlyne Greenspan, MD  insulin aspart (NOVOLOG) 100 UNIT/ML injection Inject 0-15 Units into the skin 3 (three) times daily with meals. 12/09/14  Yes Albertine Patricia, MD  insulin glargine (LANTUS) 100 UNIT/ML injection Inject 0.15 mLs (15 Units total) into the skin daily. Patient taking differently: Inject 10-30 Units into the skin daily as needed (for elevated blood sugar levels).  12/09/14  Yes Albertine Patricia, MD  levalbuterol (XOPENEX) 1.25 MG/3ML nebulizer solution Take 0.63 mg by nebulization every 4 (four) hours as needed for wheezing or shortness of breath. 07/14/14  Yes Shanker Kristeen Mans, MD  lisinopril (PRINIVIL,ZESTRIL) 20 MG tablet  Take 20 mg by mouth daily.    Yes Historical Provider, MD  loratadine (CLARITIN) 10 MG tablet Take 10 mg by mouth daily.   Yes Historical Provider, MD  metFORMIN (GLUCOPHAGE) 1000 MG tablet Take 2,000 mg by mouth 2 (two) times daily with a meal.    Yes Historical Provider, MD  metolazone (ZAROXOLYN) 2.5 MG tablet TAKE ONE (1) TABLET EACH DAY 10/03/14  Yes Lendon Colonel, NP  metoprolol tartrate (LOPRESSOR) 25 MG tablet Take 25 mg by mouth 2 (two) times daily.    Yes Historical Provider, MD  nitroGLYCERIN (NITROSTAT) 0.4 MG SL tablet Place 0.4 mg under the tongue every 5 (five) minutes x 3 doses as needed for chest pain.    Yes Historical Provider, MD  Pancrelipase, Lip-Prot-Amyl, (CREON) 24000 UNITS CPEP Take 48,000-72,000 Units by mouth See admin instructions. Take 72000 units 3 times daily with all meals and take 48000 units with snacks   Yes Historical Provider, MD  SYMBICORT 160-4.5 MCG/ACT inhaler Inhale 1-2 puffs into the lungs 2 (two) times daily as needed (shorntess of breath).  07/04/12  Yes Historical Provider, MD  tamsulosin (FLOMAX) 0.4 MG CAPS capsule Take 0.4 mg by mouth at bedtime.   Yes Historical Provider, MD  traZODone (DESYREL) 50 MG tablet Take 50 mg by mouth at bedtime as needed for sleep.    Yes Historical Provider, MD  OXYGEN Inhale 3 L into the lungs daily as needed (shortess of breath).    Historical Provider, MD   BP 147/88 mmHg  Pulse 80  Temp(Src) 98.6 F (37 C) (Oral)  Resp 20  Ht 5\' 10"  (1.778 m)  Wt 120 lb (54.432 kg)  BMI 17.22 kg/m2  SpO2 95% Physical Exam  1135: Physical examination:  Nursing notes reviewed; Vital signs and O2 SAT reviewed;  Constitutional: Well developed, Well nourished, Well hydrated, In no acute distress; Head:  Normocephalic, atraumatic; Eyes: EOMI, PERRL, No scleral icterus; ENMT: Mouth and pharynx normal, Mucous membranes moist; Neck: Supple, Full range of motion, No lymphadenopathy; Cardiovascular: Regular rate and rhythm, No gallop;  Respiratory: Breath sounds coarse & equal bilaterally, No wheezes.  Speaking full sentences with ease, Normal respiratory effort/excursion; Chest: Nontender, Movement normal; Abdomen: Soft, Nontender, Nondistended, Normal bowel sounds; Genitourinary: No CVA tenderness. Genital exam performed  with pt permission and ED RN chaperone present during exam. No perineal erythema.  +penile erythema with small superficial abrasion to tip of penis. No penile lesions or drainage.  +diffuse scrotal erythema, induration, warmth, mild edema and generalized tenderness to palp. +fungal rash on bilat inner upper thighs. Normal testicular lie.  No testicular tenderness to palp.  +cremasteric reflexes bilat.  No inguinal LAN or palpable masses..; Extremities: Pulses normal, No tenderness, No edema. +bilat AKA..; Neuro: AA&Ox3, vague historian. Major CN grossly intact.  Speech clear. No gross focal motor or sensory deficits in extremities.; Skin: Color normal, Warm, Dry.   ED Course  Procedures      EKG Interpretation   Date/Time:  Sunday May 28 2015 11:56:58 EDT Ventricular Rate:  80 PR Interval:  141 QRS Duration: 143 QT Interval:  430 QTC Calculation: 496 R Axis:   -64 Text Interpretation:  Sinus rhythm Left bundle branch block When compared  with ECG of 05/19/2015 No significant change was found Confirmed by [facility]   MD, Nunzio Cory [phone]) on 05/28/2015 12:16:36 PM      MDM  MDM Reviewed: previous chart, nursing note and vitals Reviewed previous: labs and ECG Interpretation: labs, ECG, x-ray and ultrasound      Results for orders placed or performed during the hospital encounter of 05/28/15  CBC with Differential  Result Value Ref Range   WBC 7.7 4.0 - 10.5 K/uL   RBC 3.25 (L) 4.22 - 5.81 MIL/uL   Hemoglobin 9.4 (L) 13.0 - 17.0 g/dL   HCT 28.0 (L) 39.0 - 52.0 %   MCV 86.2 78.0 - 100.0 fL   MCH 28.9 26.0 - 34.0 pg   MCHC 33.6 30.0 - 36.0 g/dL   RDW 15.4 11.5 - 15.5 %   Platelets 399 150 - 400  K/uL   Neutrophils Relative % 83 (H) 43 - 77 %   Neutro Abs 6.3 1.7 - 7.7 K/uL   Lymphocytes Relative 10 (L) 12 - 46 %   Lymphs Abs 0.8 0.7 - 4.0 K/uL   Monocytes Relative 6 3 - 12 %   Monocytes Absolute 0.5 0.1 - 1.0 K/uL   Eosinophils Relative 1 0 - 5 %   Eosinophils Absolute 0.1 0.0 - 0.7 K/uL   Basophils Relative 0 0 - 1 %   Basophils Absolute 0.0 0.0 - 0.1 K/uL  Comprehensive metabolic panel  Result Value Ref Range   Sodium 130 (L) 135 - 145 mmol/L   Potassium 3.1 (L) 3.5 - 5.1 mmol/L   Chloride 92 (L) 101 - 111 mmol/L   CO2 31 22 - 32 mmol/L   Glucose, Bld 356 (H) 65 - 99 mg/dL   BUN 14 6 - 20 mg/dL   Creatinine, Ser 0.67 0.61 - 1.24 mg/dL   Calcium 7.7 (L) 8.9 - 10.3 mg/dL   Total Protein 5.7 (L) 6.5 - 8.1 g/dL   Albumin 1.8 (L) 3.5 - 5.0 g/dL   AST 16 15 - 41 U/L   ALT 8 (L) 17 - 63 U/L   Alkaline Phosphatase 70 38 - 126 U/L   Total Bilirubin 0.3 0.3 - 1.2 mg/dL   GFR calc non Af Amer >60 >60 mL/min   GFR calc Af Amer >60 >60 mL/min   Anion gap 7 5 - 15  Brain natriuretic peptide  Result Value Ref Range   B Natriuretic Peptide 792.0 (H) 0.0 - 100.0 pg/mL  I-Stat CG4 Lactic Acid, ED  Result Value Ref Range   Lactic Acid, Venous  0.49 (L) 0.5 - 2.0 mmol/L  I-stat troponin, ED  Result Value Ref Range   Troponin i, poc 0.03 0.00 - 0.08 ng/mL   Comment 3            [place] Art/ven Flow Abd Pelv Doppler Limited 05/28/2015   CLINICAL DATA:  Bilateral testicular pain and swelling for 2 months. Rash.  EXAM: SCROTAL ULTRASOUND  DOPPLER ULTRASOUND OF THE TESTICLES  TECHNIQUE: Complete ultrasound examination of the testicles, epididymis, and other scrotal structures was performed. Color and spectral Doppler ultrasound were also utilized to evaluate blood flow to the testicles.  COMPARISON:  None.  FINDINGS: Right testicle  Measurements: 4.6 x 2.9 x 3.2 cm. No mass or microlithiasis visualized.  Left testicle  Measurements: 3.7 x 2.1 x 3.2 cm. No mass or microlithiasis visualized.   Pulsed Doppler interrogation of both testes demonstrates normal low resistance arterial and venous waveforms bilaterally.  Right epididymis: Heterogeneous echotexture without enlargement or increased vascularity.  Left epididymis: Heterogeneous echotexture without enlargement or increased vascularity.  Hydrocele:  Large bilateral hydroceles with some debris.  Varicocele: None.  IMPRESSION: 1. No evidence of testicular torsion. 2. Large bilateral hydroceles. 3. Heterogeneous, nonenlarged epididymis bilaterally without evidence of acute epididymitis. No mass.   Electronically Signed   By: Logan Bores   On: 05/28/2015 13:28   Dg Chest Port 1 View 05/28/2015   CLINICAL DATA:  Shortness of breath.  Testicular pain and swelling.  EXAM: PORTABLE CHEST - 1 VIEW  COMPARISON:  Single view of the chest 05/19/2015.  FINDINGS: Bibasilar airspace disease seen on the comparison examination is markedly improved. There is a small left pleural effusion. No right effusion is seen. Heart size is upper normal. The patient is status post CABG.  IMPRESSION: Improved bibasilar airspace disease. Small left effusion is noted. No new abnormality.   Electronically Signed   By: Inge Rise M.D.   On: 05/28/2015 11:36    1355:  BNP elevated from previous but no overt pulmonary edema on CXR. IV clindamycin given for scrotal cellulitis. Dx and testing d/w pt.  Questions answered.  Verb understanding, agreeable to admit.  T/C to [facility] Dr. Caryn Section, case discussed, including:  HPI, pertinent PM/SHx, VS/PE, dx testing, ED course and treatment:  Agreeable to admit, requests to write temporary orders, obtain tele bed to team APAdmits.  1430:  [facility] MD at bedside to admit: pt now is telling family and [facility] MD that he does not want to be admitted and wants to "go home right now."  Pt and family informed re: dx testing results, including need for IV abx for scrotal cellulitis, and that I and the [facility] MD recommend at least observation  admission for further evaluation and treatment.  Pt refuses admission.  I and the [facility] MD encouraged pt to stay, continues to refuse.  Pt makes his own medical decisions.  Risks of AMA explained to pt and family, including, but not limited to:  Fournier gangrene, stroke, heart attack, cardiac arrythmia ("irregular heart rate/beat"), "passing out," temporary and/or permanent disability, death.  Pt and family verb understanding and continue to refuse admission, understanding the consequences of their decision.  I encouraged pt to follow up with his PMD tomorrow and return to the ED immediately if symptoms worsen, he changes his mind, or for any other concerns.  Pt and family verb understanding, agreeable.   Francine Graven, DO 05/30/15 1706

## 2015-05-29 ENCOUNTER — Encounter (HOSPITAL_COMMUNITY): Payer: Self-pay | Admitting: Internal Medicine

## 2015-05-29 DIAGNOSIS — I255 Ischemic cardiomyopathy: Secondary | ICD-10-CM

## 2015-05-29 DIAGNOSIS — N433 Hydrocele, unspecified: Secondary | ICD-10-CM | POA: Diagnosis present

## 2015-05-29 DIAGNOSIS — E876 Hypokalemia: Secondary | ICD-10-CM | POA: Diagnosis present

## 2015-05-29 DIAGNOSIS — N492 Inflammatory disorders of scrotum: Principal | ICD-10-CM

## 2015-05-29 DIAGNOSIS — E1169 Type 2 diabetes mellitus with other specified complication: Secondary | ICD-10-CM

## 2015-05-29 LAB — COMPREHENSIVE METABOLIC PANEL
ALBUMIN: 1.8 g/dL — AB (ref 3.5–5.0)
ALK PHOS: 68 U/L (ref 38–126)
ALT: 9 U/L — AB (ref 17–63)
AST: 15 U/L (ref 15–41)
Anion gap: 7 (ref 5–15)
BUN: 12 mg/dL (ref 6–20)
CALCIUM: 8 mg/dL — AB (ref 8.9–10.3)
CHLORIDE: 95 mmol/L — AB (ref 101–111)
CO2: 35 mmol/L — AB (ref 22–32)
Creatinine, Ser: 0.58 mg/dL — ABNORMAL LOW (ref 0.61–1.24)
Glucose, Bld: 52 mg/dL — ABNORMAL LOW (ref 65–99)
Potassium: 2.6 mmol/L — CL (ref 3.5–5.1)
SODIUM: 137 mmol/L (ref 135–145)
Total Bilirubin: 0.3 mg/dL (ref 0.3–1.2)
Total Protein: 5.7 g/dL — ABNORMAL LOW (ref 6.5–8.1)

## 2015-05-29 LAB — CBC
HCT: 30.8 % — ABNORMAL LOW (ref 39.0–52.0)
Hemoglobin: 10 g/dL — ABNORMAL LOW (ref 13.0–17.0)
MCH: 27.9 pg (ref 26.0–34.0)
MCHC: 32.5 g/dL (ref 30.0–36.0)
MCV: 86 fL (ref 78.0–100.0)
Platelets: 482 10*3/uL — ABNORMAL HIGH (ref 150–400)
RBC: 3.58 MIL/uL — ABNORMAL LOW (ref 4.22–5.81)
RDW: 15.6 % — AB (ref 11.5–15.5)
WBC: 7.5 10*3/uL (ref 4.0–10.5)

## 2015-05-29 LAB — GLUCOSE, CAPILLARY
GLUCOSE-CAPILLARY: 50 mg/dL — AB (ref 65–99)
GLUCOSE-CAPILLARY: 85 mg/dL (ref 65–99)
GLUCOSE-CAPILLARY: 136 mg/dL — AB (ref 65–99)
Glucose-Capillary: 146 mg/dL — ABNORMAL HIGH (ref 65–99)
Glucose-Capillary: 254 mg/dL — ABNORMAL HIGH (ref 65–99)

## 2015-05-29 LAB — MAGNESIUM: MAGNESIUM: 1.3 mg/dL — AB (ref 1.7–2.4)

## 2015-05-29 MED ORDER — POTASSIUM CHLORIDE CRYS ER 20 MEQ PO TBCR
30.0000 meq | EXTENDED_RELEASE_TABLET | Freq: Two times a day (BID) | ORAL | Status: DC
  Administered 2015-05-29 – 2015-05-30 (×3): 30 meq via ORAL
  Filled 2015-05-29 (×6): qty 1

## 2015-05-29 MED ORDER — MAGNESIUM SULFATE 2 GM/50ML IV SOLN
2.0000 g | Freq: Once | INTRAVENOUS | Status: AC
  Administered 2015-05-29: 2 g via INTRAVENOUS
  Filled 2015-05-29: qty 50

## 2015-05-29 MED ORDER — LEVALBUTEROL HCL 0.63 MG/3ML IN NEBU
0.6300 mg | INHALATION_SOLUTION | Freq: Four times a day (QID) | RESPIRATORY_TRACT | Status: DC
  Administered 2015-05-29 – 2015-05-30 (×3): 0.63 mg via RESPIRATORY_TRACT
  Filled 2015-05-29 (×4): qty 3

## 2015-05-29 MED ORDER — NYSTATIN 100000 UNIT/GM EX POWD
Freq: Two times a day (BID) | CUTANEOUS | Status: DC
  Administered 2015-05-29 – 2015-05-30 (×3): via TOPICAL
  Filled 2015-05-29: qty 15

## 2015-05-29 MED ORDER — MAGNESIUM SULFATE 50 % IJ SOLN: 2.0000 g | Freq: Once | INTRAVENOUS | Status: DC

## 2015-05-29 MED ORDER — METFORMIN HCL 500 MG PO TABS
500.0000 mg | ORAL_TABLET | Freq: Two times a day (BID) | ORAL | Status: DC
  Administered 2015-05-29 – 2015-05-30 (×2): 500 mg via ORAL
  Filled 2015-05-29 (×2): qty 1

## 2015-05-29 NOTE — Progress Notes (Signed)
Inpatient Diabetes Program Recommendations  AACE/ADA: New Consensus Statement on Inpatient Glycemic Control (2013)  Target Ranges:  Prepandial:   less than 140 mg/dL      Peak postprandial:   less than 180 mg/dL (1-2 hours)      Critically ill patients:  140 - 180 mg/dL   Results for MOUHAMED, GLASSCO (MRN [id number]) as of 05/29/2015 11:00  Ref. Range 05/28/2015 21:52 05/29/2015 07:16 05/29/2015 07:44  Glucose-Capillary Latest Ref Range: 65-99 mg/dL 362 (H) 50 (L) 85   Diabetes history: DM2 Outpatient Diabetes medications: Lantus 15 units daily (home med list states pt is taking 10-30 units daily as needed), Novolog 0-15 units TID, Metformin 1000 mg BID Current orders for Inpatient glycemic control: Lantus 15 units daily, Novolog 0-15 units TID with meals, Novolog 0-5 units HS, Metformin 1000 mg BID  Inpatient Diabetes Program Recommendations Insulin - Basal: Patient is ordered Lantus 15 units daily. In reviewing the chart, noted patient received Lantus 15 units last night at 22:40 and has already received Lantus 15 units today at 10:02 am. Fasting glucose was 50 mg/dl this morning. Concerned about recurrent hypoglycemia since patient received Lantus dosages within 12 hours apart instead of 24 hours apart.   Note: Discussed with Rodman Key, RN to make him aware of concern for hypoglycemia.  Thanks, Barnie Alderman, RN, MSN, CCRN, CDE Diabetes Coordinator Inpatient Diabetes Program [phone] (Team Pager from [place] to [place]) [phone] ([facility] office) [phone] [facility] office) [phone] [facility] office)

## 2015-05-29 NOTE — Consult Note (Addendum)
WOC consult requested for scrotum/perineum/penis edema. Consult performed via phone conversation with the bedside nurse.   According to the EMR results: US reveals large bilateral hydrocele. Perineum with erythema some swelling of penis.    There is currently no open wound according to the nurses' assessment; generalized edema related to cellulitis and maceration in patchy areas to posterior scrotum.  Topical treatment will be minimally effective for this medical problem. Apply barrier cream to repel moisture and protect skin, elevate on rolled linen to assist with reducing edema.  Please consult urology for further input if desired. Please re-consult if further assistance is needed.  Thank-you,  Julien Girt MSN, [place], [place], [place], [place]

## 2015-05-29 NOTE — Plan of Care (Signed)
Dr. Darlyn Chamber of 2.6 potassium critical value - 30mg  PO potassium added

## 2015-05-29 NOTE — Care Management Note (Signed)
Case Management Note  Patient Details  Name: Stephen [name] MRN: [id number] Date of Birth: 1941/09/01  Subjective/Objective:                  Pt admitted from home with scrotal cellulitis. Pt lives with his wife and will return home at discharge. Pt is active with [facility] RN and PT. Pt has hospital bed, home O2, neb machine, w/c, and walker for home use.   Action/Plan: Will continue to follow for discharge planning needs.   Expected Discharge Date:                  Expected Discharge Plan:  [place]  In-House Referral:  NA  Discharge planning Services  CM Consult  Post Acute Care Choice:  Resumption of Svcs/PTA Provider Choice offered to:  Adult Children  DME Arranged:    DME Agency:     HH Arranged:  RN, PT [place] Agency:  [place]  Status of Service:  In process, will continue to follow  Medicare Important Message Given:    Date Medicare IM Given:    Medicare IM give by:    Date Additional Medicare IM Given:    Additional Medicare Important Message give by:     If discussed at [place] of Stay Meetings, dates discussed:    Additional Comments:  Joylene Draft, RN 05/29/2015, 11:20 AM

## 2015-05-29 NOTE — Progress Notes (Signed)
[facility] PROGRESS NOTE  CROY DRUMWRIGHT AST:[id number] DOB: 18-Feb-1941 DOA: 05/28/2015 PCP: [name], PA-C  Assessment/Plan: 1. Scrotal cellulitis. Max temp 99.1. Hemodynamically stable and non-toxic appearing. [place] with no evidence of testicular torsion. US reveals large bilateral hydrocele.  Perineum with erythema some swelling of penis. Reports improving from yesterday. Continue vanc and zosyn day #2. Request wound care consult.  monitor 2. Diabetes. Recent HgA1c 9.6 indicating poor control. CBG range 85-362. Received Lantus last evening and again this am after fasting glucose of 50. Will discontinue lantus for now. Will encourage food and monitor closely.  3. Hypertension. Fair control. Home medications include lasix lisinopril metoprolol tartrate. Will continue these and monitor 4. Ischemic cardiomyopathy/PVD. S/P bilateral ALA. Followed Vascular Surgery  Stable. 5. Anemia: related to chronic disease in setting of recent right AKA. Hg stable. No s/sx bleeding. Monitor 6. Hypokalemia: likely related to decreased po intake. Will replete and recheck .obtain magnesium levedl   Code Status: full Family Communication: none present Disposition Plan: home when ready hopefully 24-48 hours   Consultants:  Wound care  Procedures:  none  Antibiotics:  Vancomycin 05/28/15>>  Zosyn 05/28/15>>>  HPI/Subjective: Sitting up in bed. Reports feeling better and would like to go home.   Objective: Filed Vitals:   05/29/15 0958  BP: 162/70  Pulse: 80  Temp:   Resp:     Intake/Output Summary (Last 24 hours) at 05/29/15 1250 Last data filed at 05/29/15 0946  Gross per 24 hour  Intake   1080 ml  Output    450 ml  Net    630 ml   Filed Weights   05/28/15 1800 05/28/15 2050  Weight: 54.432 kg (120 lb) 55.7 kg (122 lb 12.7 oz)    Exam:   General:  Well nourished appears chronically ill but comfortable  Cardiovascular: RRR no MGR bilateral AKA  Respiratory: normal  effort BS slightly coarse and distant no wheeze or crackles  Abdomen: non-distended +BS non-tender to palpation no guarding  Musculoskeletal: no clubbing or cyanosis   Data Reviewed: Basic Metabolic Panel:  Recent Labs Lab 05/28/15 1038 05/29/15 0625  NA 130* 137  K 3.1* 2.6*  CL 92* 95*  CO2 31 35*  GLUCOSE 356* 52*  BUN 14 12  CREATININE 0.67 0.58*  CALCIUM 7.7* 8.0*  MG  --  1.3*   Liver Function Tests:  Recent Labs Lab 05/28/15 1038 05/29/15 0625  AST 16 15  ALT 8* 9*  ALKPHOS 70 68  BILITOT 0.3 0.3  PROT 5.7* 5.7*  ALBUMIN 1.8* 1.8*   No results for input(s): LIPASE, AMYLASE in the last 168 hours. No results for input(s): AMMONIA in the last 168 hours. CBC:  Recent Labs Lab 05/28/15 1038 05/29/15 0625  WBC 7.7 7.5  NEUTROABS 6.3  --   HGB 9.4* 10.0*  HCT 28.0* 30.8*  MCV 86.2 86.0  PLT 399 482*   Cardiac Enzymes: No results for input(s): CKTOTAL, CKMB, CKMBINDEX, TROPONINI in the last 168 hours. BNP (last 3 results)  Recent Labs  02/10/15 1706 05/19/15 2310 05/28/15 1038  BNP 848.5* 432.0* 792.0*    ProBNP (last 3 results)  Recent Labs  07/21/14 2348 08/16/14 1351 08/17/14 0609  PROBNP 3366.0* 6508.0* 9230.0*    CBG:  Recent Labs Lab 05/23/15 1147 05/28/15 2152 05/29/15 0716 05/29/15 0744 05/29/15 1126  GLUCAP 150* 362* 50* 85 146*    Recent Results (from the past 240 hour(s))  Culture, blood (routine x 2)  Status: None   Collection Time: 05/19/15 11:10 PM  Result Value Ref Range Status   Specimen Description BLOOD RIGHT ARM DRAWN BY RN  Final   Special Requests BOTTLES DRAWN AEROBIC AND ANAEROBIC [place]  Final   Culture NO GROWTH 5 DAYS  Final   Report Status 05/24/2015 FINAL  Final  Culture, blood (routine x 2)     Status: None   Collection Time: 05/19/15 11:33 PM  Result Value Ref Range Status   Specimen Description RIGHT ANTECUBITAL  Final   Special Requests BOTTLES DRAWN AEROBIC AND ANAEROBIC 6CC  Final    Culture NO GROWTH 5 DAYS  Final   Report Status 05/24/2015 FINAL  Final  MRSA PCR Screening     Status: None   Collection Time: 05/20/15  1:22 AM  Result Value Ref Range Status   MRSA by PCR NEGATIVE NEGATIVE Final    Comment:        The GeneXpert MRSA Assay (FDA approved for NASAL specimens only), is one component of a comprehensive MRSA colonization surveillance program. It is not intended to diagnose MRSA infection nor to guide or monitor treatment for MRSA infections.   Culture, respiratory (NON-Expectorated)     Status: None   Collection Time: 05/21/15  3:30 AM  Result Value Ref Range Status   Specimen Description SPUTUM  Final   Special Requests NONE  Final   Gram Stain   Final    MODERATE WBC PRESENT,BOTH PMN AND MONONUCLEAR RARE SQUAMOUS EPITHELIAL CELLS PRESENT NO ORGANISMS SEEN Performed at [facility]    Culture   Final    RARE HAEMOPHILUS INFLUENZAE Note: BETA LACTAMASE NEGATIVE Performed at [facility]    Report Status 05/25/2015 FINAL  Final  MRSA PCR Screening     Status: None   Collection Time: 05/28/15  7:20 PM  Result Value Ref Range Status   MRSA by PCR NEGATIVE NEGATIVE Final    Comment:        The GeneXpert MRSA Assay (FDA approved for NASAL specimens only), is one component of a comprehensive MRSA colonization surveillance program. It is not intended to diagnose MRSA infection nor to guide or monitor treatment for MRSA infections.      Studies: US Scrotum  05/28/2015   CLINICAL DATA:  Bilateral testicular pain and swelling for 2 months. Rash.  EXAM: SCROTAL ULTRASOUND  DOPPLER ULTRASOUND OF THE TESTICLES  TECHNIQUE: Complete ultrasound examination of the testicles, epididymis, and other scrotal structures was performed. Color and spectral Doppler ultrasound were also utilized to evaluate blood flow to the testicles.  COMPARISON:  None.  FINDINGS: Right testicle  Measurements: 4.6 x 2.9 x 3.2 cm. No mass or microlithiasis  visualized.  Left testicle  Measurements: 3.7 x 2.1 x 3.2 cm. No mass or microlithiasis visualized.  Pulsed Doppler interrogation of both testes demonstrates normal low resistance arterial and venous waveforms bilaterally.  Right epididymis: Heterogeneous echotexture without enlargement or increased vascularity.  Left epididymis: Heterogeneous echotexture without enlargement or increased vascularity.  Hydrocele:  Large bilateral hydroceles with some debris.  Varicocele: None.  IMPRESSION: 1. No evidence of testicular torsion. 2. Large bilateral hydroceles. 3. Heterogeneous, nonenlarged epididymis bilaterally without evidence of acute epididymitis. No mass.   Electronically Signed   By: Logan Bores   On: 05/28/2015 13:28   [place] Art/ven Flow Abd Pelv Doppler Limited  05/28/2015   CLINICAL DATA:  Bilateral testicular pain and swelling for 2 months. Rash.  EXAM: SCROTAL ULTRASOUND  DOPPLER ULTRASOUND OF  THE TESTICLES  TECHNIQUE: Complete ultrasound examination of the testicles, epididymis, and other scrotal structures was performed. Color and spectral Doppler ultrasound were also utilized to evaluate blood flow to the testicles.  COMPARISON:  None.  FINDINGS: Right testicle  Measurements: 4.6 x 2.9 x 3.2 cm. No mass or microlithiasis visualized.  Left testicle  Measurements: 3.7 x 2.1 x 3.2 cm. No mass or microlithiasis visualized.  Pulsed Doppler interrogation of both testes demonstrates normal low resistance arterial and venous waveforms bilaterally.  Right epididymis: Heterogeneous echotexture without enlargement or increased vascularity.  Left epididymis: Heterogeneous echotexture without enlargement or increased vascularity.  Hydrocele:  Large bilateral hydroceles with some debris.  Varicocele: None.  IMPRESSION: 1. No evidence of testicular torsion. 2. Large bilateral hydroceles. 3. Heterogeneous, nonenlarged epididymis bilaterally without evidence of acute epididymitis. No mass.   Electronically Signed   By:  Logan Bores   On: 05/28/2015 13:28   Dg Chest Port 1 View  05/28/2015   CLINICAL DATA:  Shortness of breath.  Testicular pain and swelling.  EXAM: PORTABLE CHEST - 1 VIEW  COMPARISON:  Single view of the chest 05/19/2015.  FINDINGS: Bibasilar airspace disease seen on the comparison examination is markedly improved. There is a small left pleural effusion. No right effusion is seen. Heart size is upper normal. The patient is status post CABG.  IMPRESSION: Improved bibasilar airspace disease. Small left effusion is noted. No new abnormality.   Electronically Signed   By: Inge Rise M.D.   On: 05/28/2015 11:36    Scheduled Meds: . antiseptic oral rinse  7 mL Mouth Rinse q12n4p  . aspirin  325 mg Oral Daily  . atorvastatin  40 mg Oral Daily  . chlorhexidine  15 mL Mouth Rinse BID  . enoxaparin (LOVENOX) injection  40 mg Subcutaneous Q24H  . fenofibrate  160 mg Oral Q breakfast  . furosemide  40 mg Oral Daily  . insulin aspart  0-15 Units Subcutaneous TID WC  . insulin aspart  0-5 Units Subcutaneous QHS  . lipase/protease/amylase  72,000 Units Oral TID WC   And  . lipase/protease/amylase  48,000 Units Oral With snacks  . lisinopril  20 mg Oral Daily  . loratadine  10 mg Oral Daily  . metFORMIN  1,000 mg Oral BID WC  . metolazone  2.5 mg Oral Daily  . metoprolol tartrate  25 mg Oral BID  . nicotine  21 mg Transdermal Daily  . piperacillin-tazobactam (ZOSYN)  IV  3.375 g Intravenous Q8H  . potassium chloride  30 mEq Oral BID  . tamsulosin  0.4 mg Oral QHS  . vancomycin  1,500 mg Intravenous Q24H   Continuous Infusions:   Active Problems:   History of alcohol abuse   HTN (hypertension)   Chronic combined systolic and diastolic congestive heart failure   Cardiomyopathy, ischemic   Type 2 diabetes mellitus with other specified complication   Cellulitis, scrotum   Hypokalemia    Time spent: 30 minutes    [facility] Pager [phone]. If 7PM-7AM, please  contact night-coverage at [email or web address], password [facility] 05/29/2015, 12:50 PM  LOS: 1 day

## 2015-05-30 DIAGNOSIS — E876 Hypokalemia: Secondary | ICD-10-CM

## 2015-05-30 LAB — CBC
HCT: 29.2 % — ABNORMAL LOW (ref 39.0–52.0)
Hemoglobin: 9.6 g/dL — ABNORMAL LOW (ref 13.0–17.0)
MCH: 28.5 pg (ref 26.0–34.0)
MCHC: 32.9 g/dL (ref 30.0–36.0)
MCV: 86.6 fL (ref 78.0–100.0)
Platelets: 441 10*3/uL — ABNORMAL HIGH (ref 150–400)
RBC: 3.37 MIL/uL — ABNORMAL LOW (ref 4.22–5.81)
RDW: 15.8 % — ABNORMAL HIGH (ref 11.5–15.5)
WBC: 5.8 10*3/uL (ref 4.0–10.5)

## 2015-05-30 LAB — GLUCOSE, CAPILLARY
GLUCOSE-CAPILLARY: 193 mg/dL — AB (ref 65–99)
GLUCOSE-CAPILLARY: 330 mg/dL — AB (ref 65–99)
Glucose-Capillary: 173 mg/dL — ABNORMAL HIGH (ref 65–99)

## 2015-05-30 LAB — BASIC METABOLIC PANEL
Anion gap: 8 (ref 5–15)
BUN: 9 mg/dL (ref 6–20)
CALCIUM: 8.2 mg/dL — AB (ref 8.9–10.3)
CHLORIDE: 91 mmol/L — AB (ref 101–111)
CO2: 34 mmol/L — ABNORMAL HIGH (ref 22–32)
Creatinine, Ser: 0.62 mg/dL (ref 0.61–1.24)
GFR calc Af Amer: >60 mL/min (ref >60)
GFR calc non Af Amer: >60 mL/min (ref >60)
Glucose, Bld: 216 mg/dL — ABNORMAL HIGH (ref 65–99)
POTASSIUM: 3.2 mmol/L — AB (ref 3.5–5.1)
Sodium: 133 mmol/L — ABNORMAL LOW (ref 135–145)

## 2015-05-30 LAB — MAGNESIUM: MAGNESIUM: 1.6 mg/dL — AB (ref 1.7–2.4)

## 2015-05-30 MED ORDER — POTASSIUM CHLORIDE CRYS ER 20 MEQ PO TBCR
40.0000 meq | EXTENDED_RELEASE_TABLET | Freq: Once | ORAL | Status: AC
  Administered 2015-05-30: 40 meq via ORAL
  Filled 2015-05-30: qty 2

## 2015-05-30 MED ORDER — LEVALBUTEROL HCL 1.25 MG/3ML IN NEBU: 0.6300 mg | INHALATION_SOLUTION | RESPIRATORY_TRACT | Status: AC | PRN

## 2015-05-30 MED ORDER — POTASSIUM CHLORIDE ER 20 MEQ PO TBCR: EXTENDED_RELEASE_TABLET | ORAL | Status: AC

## 2015-05-30 MED ORDER — AMOXICILLIN-POT CLAVULANATE 500-125 MG PO TABS: 1.0000 | ORAL_TABLET | Freq: Two times a day (BID) | ORAL | Status: DC

## 2015-05-30 MED ORDER — FLUCONAZOLE 100MG IVPB
100.0000 mg | INTRAVENOUS | Status: DC
  Filled 2015-05-30: qty 50

## 2015-05-30 MED ORDER — AMOXICILLIN-POT CLAVULANATE 500-125 MG PO TABS: 1.0000 | ORAL_TABLET | Freq: Three times a day (TID) | ORAL | Status: DC

## 2015-05-30 MED ORDER — FLUCONAZOLE 100MG IVPB
100.0000 mg | INTRAVENOUS | Status: DC
  Administered 2015-05-30: 100 mg via INTRAVENOUS
  Filled 2015-05-30 (×2): qty 50

## 2015-05-30 MED ORDER — FLUCONAZOLE 100MG IVPB: 100.0000 mg | INTRAVENOUS | Status: DC

## 2015-05-30 MED ORDER — MAGNESIUM OXIDE 400 MG PO TABS: 400.0000 mg | ORAL_TABLET | Freq: Every day | ORAL | Status: AC

## 2015-05-30 MED ORDER — INSULIN GLARGINE 100 UNIT/ML ~~LOC~~ SOLN: 10.0000 [IU] | Freq: Every day | SUBCUTANEOUS | Status: DC | PRN

## 2015-05-30 MED ORDER — FLUCONAZOLE 50 MG PO TABS: 50.0000 mg | ORAL_TABLET | Freq: Every day | ORAL | Status: AC

## 2015-05-30 MED ORDER — MAGNESIUM OXIDE 400 MG PO TABS: 400.0000 mg | ORAL_TABLET | Freq: Every day | ORAL | Status: DC

## 2015-05-30 NOTE — Care Management Important Message (Signed)
Important Message  Patient Details  Name: AMALIO LOE MRN: [id number] Date of Birth: [birth year]   Medicare Important Message Given:  N/A - LOS <3 / Initial given by admissions    Joylene Draft, RN 05/30/2015, 3:29 PM

## 2015-05-30 NOTE — Progress Notes (Signed)
NURSING PROGRESS NOTE  MORT SMELSER [id number] Discharge Data: 05/30/2015 4:53 PM Attending Provider: No att. providers found [id number], Londell Moh, PA-C   Elenora Fender to be D/C'd Home per MD order.    All IV's discontinued and monitored for bleeding.  All belongings returned to patient for patient to take home.  AVS summary and prescriptions reviewed with patient. Patient verbalized understanding.  Patient left floor via wheelchair, escorted by NT.  Last Documented Vital Signs:  Blood pressure 154/61, pulse 80, temperature 98.1 F (36.7 C), temperature source Oral, resp. rate 18, weight 54.9 kg (121 lb 0.5 oz), SpO2 96 %.  Cecilie Kicks D

## 2015-05-30 NOTE — Progress Notes (Signed)
Inpatient Diabetes Program Recommendations  AACE/ADA: New Consensus Statement on Inpatient Glycemic Control (2013)  Target Ranges:  Prepandial:   less than 140 mg/dL      Peak postprandial:   less than 180 mg/dL (1-2 hours)      Critically ill patients:  140 - 180 mg/dL   Results for JESSI, JESSOP (MRN [id number]) as of 05/30/2015 13:29  Ref. Range 05/29/2015 07:16 05/29/2015 07:44 05/29/2015 11:26 05/29/2015 16:35 05/29/2015 22:54 05/30/2015 05:33 05/30/2015 07:32 05/30/2015 11:33  Glucose-Capillary Latest Ref Range: 65-99 mg/dL 50 (L) 85 146 (H) 136 (H) 254 (H) 193 (H) 173 (H) 330 (H)   Reason for Admission: Cellulitis  Diabetes history: DM 2 Outpatient Diabetes medications: Lantus 10-30 units Metformin 1,000 mg BID, Novolog 0-15 units TID Current orders for Inpatient glycemic control: Novolog Moderate + HS scale, Metformin 500 mg BID  Inpatient Diabetes Program Recommendations Insulin - Basal: Patient in the 300's at lunch time. Patient did not have hypoglycemia after the 50's yesterday morning. Please consider ordering a portion of patients basal insulin again. Consider starting at Lantus 10 units Daily. Please give dose today.   Thanks,  Tama Headings RN, MSN, [facility] Inpatient Diabetes Coordinator Team Pager [phone]

## 2015-05-30 NOTE — Care Management Note (Signed)
Case Management Note  Patient Details  Name: Stephen [name] MRN: [id number] Date of Birth: 07-Aug-1941  Subjective/Objective:                    Action/Plan:   Expected Discharge Date:                  Expected Discharge Plan:  [place]  In-House Referral:  NA  Discharge planning Services  CM Consult  Post Acute Care Choice:  Resumption of Svcs/PTA Provider Choice offered to:  Adult Children  DME Arranged:    DME Agency:     HH Arranged:  RN, PT, OT [place] Agency:  [facility]  Status of Service:  Completed, signed off  Medicare Important Message Given:    Date Medicare IM Given:    Medicare IM give by:    Date Additional Medicare IM Given:    Additional Medicare Important Message give by:     If discussed at [place] of Stay Meetings, dates discussed:    Additional Comments: Pt to be discharged home today with resumption of [facility] RN, Pt, Ot (per wife choice). Emma with [facility] is aware and will collect pts information from the chart. [place] services to resume within 48 hours of discharge. No new DME needs noted. Pts wife and pts nurse aware of discharge arrangements. Christinia Gully [place], RN 05/30/2015, 3:27 PM

## 2015-05-30 NOTE — Discharge Summary (Signed)
Physician Discharge Summary  Stephen [name] AYT:[id number] DOB: 1940-11-14 DOA: 05/28/2015  PCP: [name], PA-C  Admit date: 05/28/2015 Discharge date: 05/30/2015  Time spent: 40 minutes  Recommendations for Outpatient Follow-up:  1. PCP 1 week for evaluation of cellulitis and BMET to track potassium level, magnesium level monitor DM control. 2. HH PT resumed per case manager  Discharge Diagnoses:  Principal Problem:   Cellulitis, scrotum Active Problems:   History of alcohol abuse   Chronic combined systolic and diastolic congestive heart failure   Essential hypertension   Cardiomyopathy, ischemic   Type 2 diabetes mellitus with other specified complication   Hypokalemia   Bilateral hydrocele   Discharge Condition: stable  Diet recommendation: carb modified   Filed Weights   05/28/15 1800 05/28/15 2050 05/30/15 0519  Weight: 54.432 kg (120 lb) 55.7 kg (122 lb 12.7 oz) 54.9 kg (121 lb 0.5 oz)    History of present illness:  This is a 74 year old man, diabetic, presented to the hospital on 05/28/15 with a two-week history of swelling, redness and pain in the scrotum. He was seen earlier that day in the emergency room  and was diagnosed with scrotal cellulitis and was apparently due to be admitted to the hospital but left [place]. However, he came back and was willing to stay in the hospital. He denied any fever, chills. He did not recollect any trauma, bites to his scrotal area. Evaluation in the emergency room showed him to have scrotal cellulitis. Ultrasound of the scrotum did not show any epididymitis or orchitis underlying. It did show bilateral hydroceles.   Hospital Course:  1. Scrotal cellulitis. Admitted to medical bed and provided with vancomycin and zosyn. Concern for fungal component as well and Diflucan added. He improved steadily. He remained hemodynamically stable and non-toxic appearing. [place] with no evidence of testicular torsion. US reveals large  bilateral hydrocele. Hydroceles non-tender. Evaluated by wound RN who recommended barrier cream.At discharge perineum with much less erythema and minimal swelling of penis. Will discharge on augmentin and diflucan. Follow up with PCP to track potassium level.  2. Diabetes. Recent HgA1c 9.6 indicating poor control. CBG range 173-330. Resume home regimen. Follow up with PCP 1-2 weeks for evaluation of DM control  3. Hypertension. Fair control. Home medications include lasix lisinopril metoprolol tartrate.  4. Ischemic cardiomyopathy/PVD. S/P bilateral ALA. Followed Vascular Surgery Stable. 5. Anemia: related to chronic disease in setting of recent right AKA. Hg stable. No s/sx bleeding. Monitor 6. Hypokalemia: likely related to decreased po intake in setting of hypomagnesemia and diuretics. Magnesium and potassium repleted. Will start daily potassium supplement   Procedures:  none  Consultations:  none  Discharge Exam: Filed Vitals:   05/30/15 0519  BP: 157/59  Pulse: 73  Temp: 98 F (36.7 C)  Resp: 18    General: well nourished in wheelchair in hallway Cardiovascular: RRR no MGR Bilateral AKA right incision healing well Respiratory: normal effort BS somewhat coarse no crackles  Discharge Instructions    Current Discharge Medication List    START taking these medications   Details  amoxicillin-clavulanate (AUGMENTIN) 500-125 MG per tablet Take 1 tablet (500 mg total) by mouth 2 (two) times daily. Qty: 10 tablet, Refills: 0    fluconazole (DIFLUCAN) 50 MG tablet Take 1 tablet (50 mg total) by mouth daily. Qty: 5 tablet, Refills: 0    magnesium oxide (MAG-OX) 400 MG tablet Take 1 tablet (400 mg total) by mouth daily. Qty: 14 tablet, Refills: 0  potassium chloride 20 MEQ TBCR Take 1 tab twice daily for 14 days Qty: 28 tablet, Refills: 0      CONTINUE these medications which have CHANGED   Details  insulin glargine (LANTUS) 100 UNIT/ML injection Inject 0.1-0.3 mLs  (10-30 Units total) into the skin daily as needed (for elevated blood sugar levels).    levalbuterol (XOPENEX) 1.25 MG/3ML nebulizer solution Take 0.63 mg by nebulization every 4 (four) hours as needed for wheezing or shortness of breath.      CONTINUE these medications which have NOT CHANGED   Details  albuterol-ipratropium (COMBIVENT) 18-103 MCG/ACT inhaler Inhale 1-2 puffs into the lungs every 6 (six) hours as needed for wheezing or shortness of breath.    aspirin 325 MG tablet Take 325 mg by mouth daily.    atorvastatin (LIPITOR) 40 MG tablet Take 40 mg by mouth daily.    fenofibrate 160 MG tablet Take 160 mg by mouth daily with breakfast.     furosemide (LASIX) 20 MG tablet Take 40 mg by mouth daily.    HYDROcodone-acetaminophen (NORCO/VICODIN) 5-325 MG per tablet Take 1 tablet by mouth 4 (four) times daily as needed for moderate pain. Qty: 30 tablet, Refills: 0    insulin aspart (NOVOLOG) 100 UNIT/ML injection Inject 0-15 Units into the skin 3 (three) times daily with meals. Qty: 10 mL, Refills: 11    lisinopril (PRINIVIL,ZESTRIL) 20 MG tablet Take 20 mg by mouth daily.     loratadine (CLARITIN) 10 MG tablet Take 10 mg by mouth daily.    metFORMIN (GLUCOPHAGE) 1000 MG tablet Take 1,000 mg by mouth 2 (two) times daily with a meal.     metolazone (ZAROXOLYN) 2.5 MG tablet TAKE ONE (1) TABLET EACH DAY Qty: 30 tablet, Refills: 0    metoprolol tartrate (LOPRESSOR) 25 MG tablet Take 25 mg by mouth 2 (two) times daily.     OXYGEN Inhale 3 L into the lungs daily as needed (shortess of breath).    Pancrelipase, Lip-Prot-Amyl, (CREON) 24000 UNITS CPEP Take 48,000-72,000 Units by mouth See admin instructions. Take 72000 units 3 times daily with all meals and take 48000 units with snacks    tamsulosin (FLOMAX) 0.4 MG CAPS capsule Take 0.4 mg by mouth at bedtime.    traZODone (DESYREL) 50 MG tablet Take 50 mg by mouth at bedtime as needed for sleep.     nitroGLYCERIN (NITROSTAT)  0.4 MG SL tablet Place 0.4 mg under the tongue every 5 (five) minutes x 3 doses as needed for chest pain.     SYMBICORT 160-4.5 MCG/ACT inhaler Inhale 1-2 puffs into the lungs 2 (two) times daily as needed (shorntess of breath).       STOP taking these medications     doxycycline (VIBRAMYCIN) 100 MG capsule      Amino Acids-Protein Hydrolys (FEEDING SUPPLEMENT, PRO-STAT SUGAR FREE 64,) LIQD      clindamycin (CLEOCIN) 150 MG capsule        Allergies  Allergen Reactions  . Lovaza [Omega-3-Acid Ethyl Esters] Itching  . Lyrica [Pregabalin] Hives  . Zithromax [Azithromycin]     rash  . Levaquin [Levofloxacin] Hives   Follow-up Information    Follow up with [name], PA-C On 06/07/2015.   Specialty:  General Practice   Why:  will need BMET to track potassium level at 44:00 pm   Contact information:   [address] [place] [address] [phone]        The results of significant diagnostics from  this hospitalization (including imaging, microbiology, ancillary and laboratory) are listed below for reference.    Significant Diagnostic Studies: Ct Head Wo Contrast  05/20/2015   CLINICAL DATA:  Altered mental status, shortness of breath for 2 days, fever and call. History of hypertension, hyperlipidemia.  EXAM: CT HEAD WITHOUT CONTRAST  TECHNIQUE: Contiguous axial images were obtained from the base of the skull through the vertex without intravenous contrast.  COMPARISON:  CT head May 14, 2015  FINDINGS: The ventricles and sulci are normal for age. No intraparenchymal hemorrhage, mass effect nor midline shift. Patchy supratentorial white matter hypodensities are less than expected for patient's age and though non-specific suggest sequelae of chronic small vessel ischemic disease. No acute large vascular territory infarcts.  No abnormal extra-axial fluid collections. Basal cisterns are patent. Mild calcific atherosclerosis of the carotid siphons.  No skull fracture. Old mildly  depressed LEFT nasal bone fracture. Moderate ethmoid paranasal sinus mucosal thickening, frothy secretions RIGHT sphenoid sinus. Status post RIGHT ocular lens implant. The mastoid aircells and included paranasal sinuses are well-aerated. Patient is edentulous. Soft tissue within the external auditory canals likely represents cerumen.  IMPRESSION: No acute intracranial process on this mildly motion degraded examination; normal noncontrast CT head for age.  Acute moderate sphenoid ethmoidal sinusitis.   Electronically Signed   By: [name] M.D.   On: 05/20/2015 01:42   US Scrotum  05/28/2015   CLINICAL DATA:  Bilateral testicular pain and swelling for 2 months. Rash.  EXAM: SCROTAL ULTRASOUND  DOPPLER ULTRASOUND OF THE TESTICLES  TECHNIQUE: Complete ultrasound examination of the testicles, epididymis, and other scrotal structures was performed. Color and spectral Doppler ultrasound were also utilized to evaluate blood flow to the testicles.  COMPARISON:  None.  FINDINGS: Right testicle  Measurements: 4.6 x 2.9 x 3.2 cm. No mass or microlithiasis visualized.  Left testicle  Measurements: 3.7 x 2.1 x 3.2 cm. No mass or microlithiasis visualized.  Pulsed Doppler interrogation of both testes demonstrates normal low resistance arterial and venous waveforms bilaterally.  Right epididymis: Heterogeneous echotexture without enlargement or increased vascularity.  Left epididymis: Heterogeneous echotexture without enlargement or increased vascularity.  Hydrocele:  Large bilateral hydroceles with some debris.  Varicocele: None.  IMPRESSION: 1. No evidence of testicular torsion. 2. Large bilateral hydroceles. 3. Heterogeneous, nonenlarged epididymis bilaterally without evidence of acute epididymitis. No mass.   Electronically Signed   By: Logan Bores   On: 05/28/2015 13:28   [place] Art/ven Flow Abd Pelv Doppler Limited  05/28/2015   CLINICAL DATA:  Bilateral testicular pain and swelling for 2 months. Rash.  EXAM:  SCROTAL ULTRASOUND  DOPPLER ULTRASOUND OF THE TESTICLES  TECHNIQUE: Complete ultrasound examination of the testicles, epididymis, and other scrotal structures was performed. Color and spectral Doppler ultrasound were also utilized to evaluate blood flow to the testicles.  COMPARISON:  None.  FINDINGS: Right testicle  Measurements: 4.6 x 2.9 x 3.2 cm. No mass or microlithiasis visualized.  Left testicle  Measurements: 3.7 x 2.1 x 3.2 cm. No mass or microlithiasis visualized.  Pulsed Doppler interrogation of both testes demonstrates normal low resistance arterial and venous waveforms bilaterally.  Right epididymis: Heterogeneous echotexture without enlargement or increased vascularity.  Left epididymis: Heterogeneous echotexture without enlargement or increased vascularity.  Hydrocele:  Large bilateral hydroceles with some debris.  Varicocele: None.  IMPRESSION: 1. No evidence of testicular torsion. 2. Large bilateral hydroceles. 3. Heterogeneous, nonenlarged epididymis bilaterally without evidence of acute epididymitis. No mass.   Electronically Signed   By:  Logan Bores   On: 05/28/2015 13:28   Dg Chest Port 1 View  05/28/2015   CLINICAL DATA:  Shortness of breath.  Testicular pain and swelling.  EXAM: PORTABLE CHEST - 1 VIEW  COMPARISON:  Single view of the chest 05/19/2015.  FINDINGS: Bibasilar airspace disease seen on the comparison examination is markedly improved. There is a small left pleural effusion. No right effusion is seen. Heart size is upper normal. The patient is status post CABG.  IMPRESSION: Improved bibasilar airspace disease. Small left effusion is noted. No new abnormality.   Electronically Signed   By: Inge Rise M.D.   On: 05/28/2015 11:36   Dg Chest Portable 1 View  05/19/2015   CLINICAL DATA:  74 year old male with shortness of breath  EXAM: PORTABLE CHEST - 1 VIEW  COMPARISON:  Chest radiograph dated 05/14/2015  FINDINGS: Single-view of the chest demonstrate emphysematous changes  of the lungs. Bilateral lower lung field airspace opacities, new from prior studies and concerning for pneumonia. Clinical correlation and follow-up recommended. A small left pleural effusion may be present. Stable cardiomegaly. Median sternotomy wires. The osseous structures are grossly unremarkable.  IMPRESSION: Bilateral lower lung field airspace opacities concerning for pneumonia. Clinical correlation and follow-up resolution recommended.   Electronically Signed   By: [name] M.D.   On: 05/19/2015 23:07    Microbiology: Recent Results (from the past 240 hour(s))  Culture, respiratory (NON-Expectorated)     Status: None   Collection Time: 05/21/15  3:30 AM  Result Value Ref Range Status   Specimen Description SPUTUM  Final   Special Requests NONE  Final   Gram Stain   Final    MODERATE WBC PRESENT,BOTH PMN AND MONONUCLEAR RARE SQUAMOUS EPITHELIAL CELLS PRESENT NO ORGANISMS SEEN Performed at [facility]    Culture   Final    RARE HAEMOPHILUS INFLUENZAE Note: BETA LACTAMASE NEGATIVE Performed at [facility]    Report Status 05/25/2015 FINAL  Final  MRSA PCR Screening     Status: None   Collection Time: 05/28/15  7:20 PM  Result Value Ref Range Status   MRSA by PCR NEGATIVE NEGATIVE Final    Comment:        The GeneXpert MRSA Assay (FDA approved for NASAL specimens only), is one component of a comprehensive MRSA colonization surveillance program. It is not intended to diagnose MRSA infection nor to guide or monitor treatment for MRSA infections.      Labs: Basic Metabolic Panel:  Recent Labs Lab 05/28/15 1038 05/29/15 0625 05/30/15 0549  NA 130* 137 133*  K 3.1* 2.6* 3.2*  CL 92* 95* 91*  CO2 31 35* 34*  GLUCOSE 356* 52* 216*  BUN 14 12 9   CREATININE 0.67 0.58* 0.62  CALCIUM 7.7* 8.0* 8.2*  MG  --  1.3* 1.6*   Liver Function Tests:  Recent Labs Lab 05/28/15 1038 05/29/15 0625  AST 16 15  ALT 8* 9*  ALKPHOS 70 68  BILITOT 0.3  0.3  PROT 5.7* 5.7*  ALBUMIN 1.8* 1.8*   No results for input(s): LIPASE, AMYLASE in the last 168 hours. No results for input(s): AMMONIA in the last 168 hours. CBC:  Recent Labs Lab 05/28/15 1038 05/29/15 0625 05/30/15 0549  WBC 7.7 7.5 5.8  NEUTROABS 6.3  --   --   HGB 9.4* 10.0* 9.6*  HCT 28.0* 30.8* 29.2*  MCV 86.2 86.0 86.6  PLT 399 482* 441*   Cardiac Enzymes: No results for input(s): CKTOTAL,  CKMB, CKMBINDEX, TROPONINI in the last 168 hours. BNP: BNP (last 3 results)  Recent Labs  02/10/15 1706 05/19/15 2310 05/28/15 1038  BNP 848.5* 432.0* 792.0*    ProBNP (last 3 results)  Recent Labs  07/21/14 2348 08/16/14 1351 08/17/14 0609  PROBNP 3366.0* 6508.0* 9230.0*    CBG:  Recent Labs Lab 05/29/15 1635 05/29/15 2254 05/30/15 0533 05/30/15 0732 05/30/15 1133  GLUCAP 136* 254* 193* 173* 330*       Signed:  BLACK,KAREN M  [facility] 05/30/2015, 3:08 PM

## 2015-05-30 NOTE — Discharge Instructions (Signed)

## 2015-06-05 ENCOUNTER — Emergency Department (HOSPITAL_COMMUNITY)
Admission: EM | Admit: 2015-06-05 | Discharge: 2015-06-06 | Disposition: A | Discharge status: Home / Self Care | Payer: Medicare Other | Attending: Emergency Medicine | Admitting: Emergency Medicine

## 2015-06-05 ENCOUNTER — Encounter (HOSPITAL_COMMUNITY): Payer: Self-pay | Admitting: Emergency Medicine

## 2015-06-05 DIAGNOSIS — J449 Chronic obstructive pulmonary disease, unspecified: Secondary | ICD-10-CM | POA: Diagnosis not present

## 2015-06-05 DIAGNOSIS — R739 Hyperglycemia, unspecified: Secondary | ICD-10-CM

## 2015-06-05 DIAGNOSIS — I129 Hypertensive chronic kidney disease with stage 1 through stage 4 chronic kidney disease, or unspecified chronic kidney disease: Secondary | ICD-10-CM | POA: Insufficient documentation

## 2015-06-05 DIAGNOSIS — Z8701 Personal history of pneumonia (recurrent): Secondary | ICD-10-CM | POA: Diagnosis not present

## 2015-06-05 DIAGNOSIS — Z89611 Acquired absence of right leg above knee: Secondary | ICD-10-CM | POA: Insufficient documentation

## 2015-06-05 DIAGNOSIS — N189 Chronic kidney disease, unspecified: Secondary | ICD-10-CM | POA: Diagnosis not present

## 2015-06-05 DIAGNOSIS — Z872 Personal history of diseases of the skin and subcutaneous tissue: Secondary | ICD-10-CM | POA: Diagnosis not present

## 2015-06-05 DIAGNOSIS — R05 Cough: Secondary | ICD-10-CM | POA: Diagnosis not present

## 2015-06-05 DIAGNOSIS — Z9841 Cataract extraction status, right eye: Secondary | ICD-10-CM | POA: Diagnosis not present

## 2015-06-05 DIAGNOSIS — K219 Gastro-esophageal reflux disease without esophagitis: Secondary | ICD-10-CM | POA: Diagnosis not present

## 2015-06-05 DIAGNOSIS — Z951 Presence of aortocoronary bypass graft: Secondary | ICD-10-CM | POA: Insufficient documentation

## 2015-06-05 DIAGNOSIS — R21 Rash and other nonspecific skin eruption: Secondary | ICD-10-CM | POA: Diagnosis not present

## 2015-06-05 DIAGNOSIS — Z794 Long term (current) use of insulin: Secondary | ICD-10-CM | POA: Diagnosis not present

## 2015-06-05 DIAGNOSIS — E785 Hyperlipidemia, unspecified: Secondary | ICD-10-CM | POA: Insufficient documentation

## 2015-06-05 DIAGNOSIS — I2510 Atherosclerotic heart disease of native coronary artery without angina pectoris: Secondary | ICD-10-CM | POA: Diagnosis not present

## 2015-06-05 DIAGNOSIS — Z792 Long term (current) use of antibiotics: Secondary | ICD-10-CM | POA: Diagnosis not present

## 2015-06-05 DIAGNOSIS — I739 Peripheral vascular disease, unspecified: Secondary | ICD-10-CM | POA: Diagnosis not present

## 2015-06-05 DIAGNOSIS — M1990 Unspecified osteoarthritis, unspecified site: Secondary | ICD-10-CM | POA: Diagnosis not present

## 2015-06-05 DIAGNOSIS — Z79899 Other long term (current) drug therapy: Secondary | ICD-10-CM | POA: Insufficient documentation

## 2015-06-05 DIAGNOSIS — Z9114 Patient's other noncompliance with medication regimen: Secondary | ICD-10-CM

## 2015-06-05 DIAGNOSIS — Z720 Tobacco use: Secondary | ICD-10-CM | POA: Diagnosis not present

## 2015-06-05 DIAGNOSIS — I509 Heart failure, unspecified: Secondary | ICD-10-CM | POA: Insufficient documentation

## 2015-06-05 DIAGNOSIS — Z862 Personal history of diseases of the blood and blood-forming organs and certain disorders involving the immune mechanism: Secondary | ICD-10-CM | POA: Insufficient documentation

## 2015-06-05 DIAGNOSIS — Z8711 Personal history of peptic ulcer disease: Secondary | ICD-10-CM | POA: Diagnosis not present

## 2015-06-05 DIAGNOSIS — I252 Old myocardial infarction: Secondary | ICD-10-CM | POA: Insufficient documentation

## 2015-06-05 DIAGNOSIS — Z7982 Long term (current) use of aspirin: Secondary | ICD-10-CM | POA: Diagnosis not present

## 2015-06-05 DIAGNOSIS — Z9119 Patient's noncompliance with other medical treatment and regimen: Secondary | ICD-10-CM | POA: Insufficient documentation

## 2015-06-05 DIAGNOSIS — E1165 Type 2 diabetes mellitus with hyperglycemia: Secondary | ICD-10-CM | POA: Insufficient documentation

## 2015-06-05 LAB — URINALYSIS, ROUTINE W REFLEX MICROSCOPIC
Bilirubin Urine: NEGATIVE
Glucose, UA: >1000 mg/dL — AB
Ketones, ur: NEGATIVE mg/dL
Leukocytes, UA: NEGATIVE
NITRITE: NEGATIVE
PH: 6.5 (ref 5.0–8.0)
Protein, ur: 100 mg/dL — AB
SPECIFIC GRAVITY, URINE: 1.015 (ref 1.005–1.030)
Urobilinogen, UA: 0.2 mg/dL (ref 0.0–1.0)

## 2015-06-05 LAB — CBC WITH DIFFERENTIAL/PLATELET
BASOS PCT: 1 % (ref 0–1)
Basophils Absolute: 0 10*3/uL (ref 0.0–0.1)
Eosinophils Absolute: 0 10*3/uL (ref 0.0–0.7)
Eosinophils Relative: 0 % (ref 0–5)
HCT: 30.4 % — ABNORMAL LOW (ref 39.0–52.0)
HEMOGLOBIN: 9.7 g/dL — AB (ref 13.0–17.0)
LYMPHS ABS: 1 10*3/uL (ref 0.7–4.0)
Lymphocytes Relative: 15 % (ref 12–46)
MCH: 27.9 pg (ref 26.0–34.0)
MCHC: 31.9 g/dL (ref 30.0–36.0)
MCV: 87.4 fL (ref 78.0–100.0)
Monocytes Absolute: 0.4 10*3/uL (ref 0.1–1.0)
Monocytes Relative: 6 % (ref 3–12)
Neutro Abs: 5.2 10*3/uL (ref 1.7–7.7)
Neutrophils Relative %: 78 % — ABNORMAL HIGH (ref 43–77)
PLATELETS: 488 10*3/uL — AB (ref 150–400)
RBC: 3.48 MIL/uL — ABNORMAL LOW (ref 4.22–5.81)
RDW: 16.5 % — AB (ref 11.5–15.5)
WBC: 6.7 10*3/uL (ref 4.0–10.5)

## 2015-06-05 LAB — COMPREHENSIVE METABOLIC PANEL
ALK PHOS: 81 U/L (ref 38–126)
ALT: 8 U/L — ABNORMAL LOW (ref 17–63)
ANION GAP: 7 (ref 5–15)
AST: 13 U/L — AB (ref 15–41)
Albumin: 2.4 g/dL — ABNORMAL LOW (ref 3.5–5.0)
BUN: 12 mg/dL (ref 6–20)
CO2: 30 mmol/L (ref 22–32)
Calcium: 8.4 mg/dL — ABNORMAL LOW (ref 8.9–10.3)
Chloride: 90 mmol/L — ABNORMAL LOW (ref 101–111)
Creatinine, Ser: 0.86 mg/dL (ref 0.61–1.24)
GFR calc Af Amer: >60 mL/min (ref >60)
Glucose, Bld: 515 mg/dL — ABNORMAL HIGH (ref 65–99)
Potassium: 4.4 mmol/L (ref 3.5–5.1)
SODIUM: 127 mmol/L — AB (ref 135–145)
Total Bilirubin: 0.2 mg/dL — ABNORMAL LOW (ref 0.3–1.2)
Total Protein: 6.6 g/dL (ref 6.5–8.1)

## 2015-06-05 LAB — BLOOD GAS, VENOUS
Acid-Base Excess: 5.1 mmol/L — ABNORMAL HIGH (ref 0.0–2.0)
BICARBONATE: 29.9 meq/L — AB (ref 20.0–24.0)
O2 CONTENT: 3 L/min
O2 SAT: 68.4 %
Patient temperature: 37
TCO2: 27.9 mmol/L (ref 0–100)
pCO2, Ven: 50.6 mmHg — ABNORMAL HIGH (ref 45.0–50.0)
pH, Ven: 7.389 — ABNORMAL HIGH (ref 7.250–7.300)
pO2, Ven: 36.8 mmHg (ref 30.0–45.0)

## 2015-06-05 LAB — URINE MICROSCOPIC-ADD ON

## 2015-06-05 LAB — CBG MONITORING, ED: GLUCOSE-CAPILLARY: 486 mg/dL — AB (ref 65–99)

## 2015-06-05 MED ORDER — SODIUM CHLORIDE 0.9 % IV BOLUS (SEPSIS)
1000.0000 mL | Freq: Once | INTRAVENOUS | Status: AC
  Administered 2015-06-05: 1000 mL via INTRAVENOUS

## 2015-06-05 NOTE — ED Provider Notes (Signed)
CSN: [id number]   Arrival date & time 06/05/15 2114  History  This chart was scribed for  Stephen Bo, MD by Altamease Oiler, ED Scribe. This patient was seen in room APA09/APA09 and the patient's care was started at 10:42 PM.  Chief Complaint  Patient presents with  . Hyperglycemia    HPI The history is provided by the patient. No language interpreter was used.   Stephen [name] is a 74 y.o. male who presents to the Emergency Department complaining of hyperglycemia this evening. The pt had not checked his blood glucose level in 3 days but when he checked it tonight after eating ice cream and graham crackers it was too high to register on his home meter. He has used Metformin to treat his diabetes for 1 year. Pt saw his PCP (Dr. Particia [name]) last week for a check-up and had lab work at that time. Pt also complains of diffuse pruritic rash, cough productive of light brown sputum, difficulty at the start of urination, dysuria, and increased urinary frequency. No fever, chest pain, nausea, or vomiting. He lives with his wife and son. Tobacco+.  Past Medical History  Diagnosis Date  . Hypertension   . Pancreatitis   . Neuropathy   . Hyperlipemia   . Peripheral vascular disease     a. 10/2013: severely abnormal ABIs, for OP evaluation.  [name] GERD (gastroesophageal reflux disease)   . PUD (peptic ulcer disease)     diagnosed via EGD by Dr. Braulio [name] at [place]  . Diverticulosis   . Hemorrhoids   . Coronary artery disease      a. BMS to LAD 1999. b. HSRA to LAD and stent to RCA 2007. c. NSTEMI/CABG x 3 in 04/2010. d. NSTEMI 10/2013 - secondary to demand ischemia in setting of COPD exacerbation with severe underlying CAD - cath with 3/3 patent grafts, for medical therapy.   [name] COPD (chronic obstructive pulmonary disease)     a. Ongoing tobacco abuse.  . Arthritis   . Neutropenia     a. Dx 10/2013 on labs - instructed to f/u PCP.  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   . Ischemic cardiomyopathy     a. previously EF 30% in 2011. b. 10/2013: EF 45-50% by echo.  . Alcoholism     a. Remote alcoholism  . 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  [name] Ulcer of right foot   . Chronic kidney disease   . Sinusitis   . Myocardial infarction   . Shortness of breath dyspnea   . History of hiatal hernia   . Pneumonia     "a couple times; years ago' (05/03/2015)  . Type II diabetes mellitus   . CHF (congestive heart failure)   . Anemia     Past Surgical History  Procedure Laterality Date  . Coronary artery bypass graft  June 2011    X3  . Inguinal hernia repair Right   . Cholecystectomy      3-4 years ago  . Appendectomy  ~ 1962  . Cataract extraction w/ intraocular lens implant Right   . Colonoscopy  05/12/2002    Dr. Gala [name]- diverticulosis, anal papilla, internal hemorrhoids, rectal polyps  . [name] dilation  04/16/2012    Schatzki's ring-status post dilation and disruption/ as described above. Grade 1 esophageal varices. Small hiatal hernia.  Stephen [name]  dilation  04/16/2012    Procedure: [name] DILATION;  Surgeon: Stephen Dolin, MD;  [facility];  Service: Endoscopy;  Laterality: N/A;  . Colonoscopy  08/10/2012    Procedure: COLONOSCOPY;  Surgeon: Stephen Dolin, MD;  [facility]: [facility];  Service: Endoscopy;  Laterality: N/A;  9:45 needs 30 mins extra /have Glucagon on hand  . Left heart catheterization with coronary/graft angiogram N/A 10/29/2013    Procedure: LEFT HEART CATHETERIZATION WITH [name];  Surgeon: Burnell Blanks, MD;  Location: [facility] CATH LAB;  Service: Cardiovascular;  Laterality: N/A;  . Abdominal aortagram N/A 09/09/2014    Procedure: ABDOMINAL [name];  Surgeon: Stephen Dutch, MD;  Location: [facility] CATH LAB;  Service: Cardiovascular;  Laterality: N/A;  . Atherectomy Bilateral 09/23/2014    Procedure:  ATHERECTOMY;  Surgeon: Stephen Dutch, MD;  Location: [facility] CATH LAB;  Service: Cardiovascular;  Laterality: Bilateral;  iliacs  . Amputation Left 12/05/2014    Procedure: AMPUTATION ABOVE KNEE;  Surgeon: Stephen Dutch, MD;  Location: [place];  Service: Vascular;  Laterality: Left;  [name] Tympanoplasty Right   . 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 Esophagogastroduodenoscopy endoscopy  2003    Dr. Braulio [name]: erosive reflux esophagitis , PUD  . Colonoscopy  05/2002    hyperplastic polyp, diverticulosis, anal papilla and internal hemorrhoids r  . Amputation Right 05/03/2015    Procedure: RIGHT ABOVE KNEE AMPUTATION;  Surgeon: Stephen Dutch, MD;  Location: [name];  Service: Vascular;  Laterality: Right;    Family History  Problem Relation Age of Onset  . Coronary artery disease Mother   . Coronary artery disease Father   . Diabetes Sister   . Suicidality Brother   . Cirrhosis Brother   . Colon cancer Neg Hx     History  Substance Use Topics  . Smoking status: Current Every Day Smoker -- 1.00 packs/day for 65 years    Types: Cigarettes  . Smokeless tobacco: Former Systems developer    Types: Snuff     Comment: 05/03/2015 "dipped snuff years and years ago"  . Alcohol Use: No     Comment: Quit drinking in 1985     Review of Systems  Constitutional: Negative for fever.  Respiratory: Positive for cough. Negative for shortness of breath.   Cardiovascular: Negative for chest pain.  Gastrointestinal: Negative for nausea and vomiting.  Genitourinary: Positive for dysuria, frequency and difficulty urinating.  Skin: Positive for rash.  All other systems reviewed and are negative.    Home Medications   Prior to Admission medications   Medication Sig Start Date End Date Taking? Authorizing Provider  albuterol-ipratropium (COMBIVENT) 18-103 MCG/ACT inhaler Inhale 1-2 puffs into the lungs every 6 (six) hours as needed for wheezing or shortness of breath. 11/02/13   Dayna N Dunn, PA-C   amoxicillin-clavulanate (AUGMENTIN) 500-125 MG per tablet Take 1 tablet (500 mg total) by mouth 2 (two) times daily. 05/30/15   Radene Gunning, NP  aspirin 325 MG tablet Take 325 mg by mouth daily.    Historical Provider, MD  atorvastatin (LIPITOR) 40 MG tablet Take 40 mg by mouth daily.    Historical Provider, MD  fenofibrate 160 MG tablet Take 160 mg by mouth daily with breakfast.     Historical Provider, MD  fluconazole (DIFLUCAN) 50 MG tablet Take 1 tablet (50 mg total) by mouth daily. 05/30/15   Radene Gunning, NP  furosemide (LASIX) 20 MG tablet Take 40 mg by mouth daily.  Historical Provider, MD  HYDROcodone-acetaminophen (NORCO/VICODIN) 5-325 MG per tablet Take 1 tablet by mouth 4 (four) times daily as needed for moderate pain. 05/23/15   Costin Karlyne Greenspan, MD  insulin aspart (NOVOLOG) 100 UNIT/ML injection Inject 0-15 Units into the skin 3 (three) times daily with meals. 12/09/14   Silver Huguenin Elgergawy, MD  insulin glargine (LANTUS) 100 UNIT/ML injection Inject 0.1-0.3 mLs (10-30 Units total) into the skin daily as needed (for elevated blood sugar levels). 05/30/15   Radene Gunning, NP  levalbuterol Penne Lash) 1.25 MG/3ML nebulizer solution Take 0.63 mg by nebulization every 4 (four) hours as needed for wheezing or shortness of breath. 05/30/15   Radene Gunning, NP  lisinopril (PRINIVIL,ZESTRIL) 20 MG tablet Take 20 mg by mouth daily.     Historical Provider, MD  loratadine (CLARITIN) 10 MG tablet Take 10 mg by mouth daily.    Historical Provider, MD  magnesium oxide (MAG-OX) 400 MG tablet Take 1 tablet (400 mg total) by mouth daily. 05/30/15   Radene Gunning, NP  metFORMIN (GLUCOPHAGE) 1000 MG tablet Take 1,000 mg by mouth 2 (two) times daily with a meal.     Historical Provider, MD  metolazone (ZAROXOLYN) 2.5 MG tablet TAKE ONE (1) TABLET EACH DAY 10/03/14   Lendon Colonel, NP  metoprolol tartrate (LOPRESSOR) 25 MG tablet Take 25 mg by mouth 2 (two) times daily.     Historical Provider, MD   nitroGLYCERIN (NITROSTAT) 0.4 MG SL tablet Place 0.4 mg under the tongue every 5 (five) minutes x 3 doses as needed for chest pain.     Historical Provider, MD  OXYGEN Inhale 3 L into the lungs daily as needed (shortess of breath).    Historical Provider, MD  Pancrelipase, Lip-Prot-Amyl, (CREON) 24000 UNITS CPEP Take 48,000-72,000 Units by mouth See admin instructions. Take 72000 units 3 times daily with all meals and take 48000 units with snacks    Historical Provider, MD  potassium chloride 20 MEQ TBCR Take 1 tab twice daily for 14 days 05/30/15   Radene Gunning, NP  SYMBICORT 160-4.5 MCG/ACT inhaler Inhale 1-2 puffs into the lungs 2 (two) times daily as needed (shorntess of breath).  07/04/12   Historical Provider, MD  tamsulosin (FLOMAX) 0.4 MG CAPS capsule Take 0.4 mg by mouth at bedtime.    Historical Provider, MD  traZODone (DESYREL) 50 MG tablet Take 50 mg by mouth at bedtime as needed for sleep.     Historical Provider, MD    Allergies  Lovaza; Lyrica; Zithromax; and Levaquin  Triage Vitals: BP 153/68 mmHg  Pulse 78  Temp(Src) 98 F (36.7 C) (Oral)  Resp 20  Wt 151 lb (68.493 kg)  SpO2 96%  Physical Exam  Constitutional: He is oriented to person, place, and time. He appears well-developed and well-nourished.  HENT:  Head: Normocephalic and atraumatic.  Right Ear: External ear normal.  Left Ear: External ear normal.  Eyes: Conjunctivae and EOM are normal. Pupils are equal, round, and reactive to light.  Neck: Normal range of motion and phonation normal. Neck supple.  Cardiovascular: Normal rate, regular rhythm and normal heart sounds.   Pulmonary/Chest: Effort normal. He has rhonchi (scattered). He exhibits no bony tenderness.  Decreased air movement bilaterally  Abdominal: Soft. There is no tenderness.  Musculoskeletal: Normal range of motion.  Bilateral AKA Staples in right AKA stump with good approximation and no drainage  Neurological: He is alert and oriented to person,  place, and time. No cranial nerve  deficit or sensory deficit. He exhibits normal muscle tone. Coordination normal.  Skin: Skin is warm, dry and intact.  Scattered petechia and excoriations Superficial breakdown in the upper gluteal crease  Psychiatric: He has a normal mood and affect. His behavior is normal. Judgment and thought content normal.  Nursing note and vitals reviewed.   ED Course  Procedures   DIAGNOSTIC STUDIES: Oxygen Saturation is 96% on RA, normal by my interpretation.    COORDINATION OF CARE: 10:51 PM Discussed treatment plan which includes lab work and IVF with pt at bedside and pt agreed to plan. Medications  sodium chloride 0.9 % bolus 1,000 mL (not administered)   Patient Vitals for the past 24 hrs:  BP Temp Temp src Pulse Resp SpO2 Weight  06/05/15 2120 153/68 mmHg 98 F (36.7 C) Oral 78 20 96 % 151 lb (68.493 kg)    Labs Review-  Labs Reviewed  BLOOD GAS, VENOUS - Abnormal; Notable for the following:    pH, Ven 7.389 (*)    pCO2, Ven 50.6 (*)    Bicarbonate 29.9 (*)    Acid-Base Excess 5.1 (*)    All other components within normal limits  COMPREHENSIVE METABOLIC PANEL - Abnormal; Notable for the following:    Sodium 127 (*)    Chloride 90 (*)    Glucose, Bld 515 (*)    Calcium 8.4 (*)    Albumin 2.4 (*)    AST 13 (*)    ALT 8 (*)    Total Bilirubin 0.2 (*)    All other components within normal limits  CBC WITH DIFFERENTIAL/PLATELET - Abnormal; Notable for the following:    RBC 3.48 (*)    Hemoglobin 9.7 (*)    HCT 30.4 (*)    RDW 16.5 (*)    Platelets 488 (*)    Neutrophils Relative % 78 (*)    All other components within normal limits  URINALYSIS, ROUTINE W REFLEX MICROSCOPIC (NOT AT [facility]) - Abnormal; Notable for the following:    Color, Urine STRAW (*)    Glucose, UA >1000 (*)    Hgb urine dipstick SMALL (*)    Protein, ur 100 (*)    All other components within normal limits  CBG MONITORING, ED - Abnormal; Notable for the following:     Glucose-Capillary 486 (*)    All other components within normal limits  URINE MICROSCOPIC-ADD ON     MDM   Final diagnoses:  Hyperglycemia  Noncompliance with medication regimen    Hyperglycemia, likely related to medication noncompliance as well as dietary indiscretion. No evidence for ketosis, sepsis, metabolic instability or suggestion of impending vascular collapse. Patient was recently hospitalized and treated. At that time, he was shown to have medication noncompliance with elevated hemoglobin A1c. He has short-term follow-up scheduled with his PCP. He was stable for discharge today.  Nursing Notes Reviewed/ Care Coordinated Applicable Imaging Reviewed Interpretation of Laboratory Data incorporated into ED treatment  The patient appears reasonably screened and/or stabilized for discharge and I doubt any other medical condition or other [facility] requiring further screening, evaluation, or treatment in the ED at this time prior to discharge.  Plan: Home Medications- usual-Take Metformin BID; Home Treatments- rest, fluids; return here if the recommended treatment, does not improve the symptoms; Recommended follow up- PCP as scheduled  I personally performed the services described in this documentation, which was scribed in my presence. The recorded information has been reviewed and is accurate.       Vira Agar  Eulis Foster, MD 06/06/15 1655

## 2015-06-05 NOTE — ED Notes (Signed)
Pt states his blood sugar has been high since 1530. Per ems their meter read high.

## 2015-06-06 ENCOUNTER — Encounter: Payer: Self-pay | Admitting: Vascular Surgery

## 2015-06-06 LAB — CBG MONITORING, ED: GLUCOSE-CAPILLARY: 317 mg/dL — AB (ref 65–99)

## 2015-06-06 NOTE — Discharge Instructions (Signed)
Take your metformin twice a day, as directed Drink 1-2 quarts of water each day.  Hyperglycemia Hyperglycemia occurs when the glucose (sugar) in your blood is too high. Hyperglycemia can happen for many reasons, but it most often happens to people who do not know they have diabetes or are not managing their diabetes properly.  CAUSES  Whether you have diabetes or not, there are other causes of hyperglycemia. Hyperglycemia can occur when you have diabetes, but it can also occur in other situations that you might not be as aware of, such as: Diabetes  If you have diabetes and are having problems controlling your blood glucose, hyperglycemia could occur because of some of the following reasons:  Not following your meal plan.  Not taking your diabetes medications or not taking it properly.  Exercising less or doing less activity than you normally do.  Being sick. Pre-diabetes  This cannot be ignored. Before people develop Type 2 diabetes, they almost always have "pre-diabetes." This is when your blood glucose levels are higher than normal, but not yet high enough to be diagnosed as diabetes. Research has shown that some long-term damage to the body, especially the heart and circulatory system, may already be occurring during pre-diabetes. If you take action to manage your blood glucose when you have pre-diabetes, you may delay or prevent Type 2 diabetes from developing. Stress  If you have diabetes, you may be "diet" controlled or on oral medications or insulin to control your diabetes. However, you may find that your blood glucose is higher than usual in the hospital whether you have diabetes or not. This is often referred to as "stress hyperglycemia." Stress can elevate your blood glucose. This happens because of hormones put out by the body during times of stress. If stress has been the cause of your high blood glucose, it can be followed regularly by your caregiver. That way he/she can make  sure your hyperglycemia does not continue to get worse or progress to diabetes. Steroids  Steroids are medications that act on the infection fighting system (immune system) to block inflammation or infection. One side effect can be a rise in blood glucose. Most people can produce enough extra insulin to allow for this rise, but for those who cannot, steroids make blood glucose levels go even higher. It is not unusual for steroid treatments to "uncover" diabetes that is developing. It is not always possible to determine if the hyperglycemia will go away after the steroids are stopped. A special blood test called an A1c is sometimes done to determine if your blood glucose was elevated before the steroids were started. SYMPTOMS  Thirsty.  Frequent urination.  Dry mouth.  Blurred vision.  Tired or fatigue.  Weakness.  Sleepy.  Tingling in feet or leg. DIAGNOSIS  Diagnosis is made by monitoring blood glucose in one or all of the following ways:  A1c test. This is a chemical found in your blood.  Fingerstick blood glucose monitoring.  Laboratory results. TREATMENT  First, knowing the cause of the hyperglycemia is important before the hyperglycemia can be treated. Treatment may include, but is not be limited to:  Education.  Change or adjustment in medications.  Change or adjustment in meal plan.  Treatment for an illness, infection, etc.  More frequent blood glucose monitoring.  Change in exercise plan.  Decreasing or stopping steroids.  Lifestyle changes. HOME CARE INSTRUCTIONS   Test your blood glucose as directed.  Exercise regularly. Your caregiver will give you instructions about  exercise. Pre-diabetes or diabetes which comes on with stress is helped by exercising.  Eat wholesome, balanced meals. Eat often and at regular, fixed times. Your caregiver or nutritionist will give you a meal plan to guide your sugar intake.  Being at an ideal weight is important. If  needed, losing as little as 10 to 15 pounds may help improve blood glucose levels. SEEK MEDICAL CARE IF:   You have questions about medicine, activity, or diet.  You continue to have symptoms (problems such as increased thirst, urination, or weight gain). SEEK IMMEDIATE MEDICAL CARE IF:   You are vomiting or have diarrhea.  Your breath smells fruity.  You are breathing faster or slower.  You are very sleepy or incoherent.  You have numbness, tingling, or pain in your feet or hands.  You have chest pain.  Your symptoms get worse even though you have been following your caregiver's orders.  If you have any other questions or concerns. Document Released: 04/16/2001 Document Revised: 01/13/2012 Document Reviewed: 02/17/2012 [facility] Patient Information 2015 [place], [place]. This information is not intended to replace advice given to you by your health care provider. Make sure you discuss any questions you have with your health care provider.

## 2015-06-08 ENCOUNTER — Encounter: Payer: Medicare Other | Admitting: Vascular Surgery

## 2015-06-08 ENCOUNTER — Ambulatory Visit (INDEPENDENT_AMBULATORY_CARE_PROVIDER_SITE_OTHER): Payer: Self-pay | Admitting: Vascular Surgery

## 2015-06-08 ENCOUNTER — Encounter: Payer: Self-pay | Admitting: Vascular Surgery

## 2015-06-08 VITALS — BP 155/53 | HR 70 | Temp 97.7°F | Resp 20

## 2015-06-08 DIAGNOSIS — L98499 Non-pressure chronic ulcer of skin of other sites with unspecified severity: Secondary | ICD-10-CM

## 2015-06-08 DIAGNOSIS — I70209 Unspecified atherosclerosis of native arteries of extremities, unspecified extremity: Secondary | ICD-10-CM

## 2015-06-08 DIAGNOSIS — I739 Peripheral vascular disease, unspecified: Secondary | ICD-10-CM

## 2015-06-08 NOTE — Progress Notes (Signed)
Patient is a 74 year old male who returns for postoperative follow-up after recent right above-knee amputation. He has previously had a left above-knee amputation.  Physical exam:  Filed Vitals:   06/08/15 1055 06/08/15 1100  BP: 144/56 155/53  Pulse: 70   Temp: 97.7 F (36.5 C)   TempSrc: Oral   Resp: 20     Right leg above-knee amputation well-healed staples removed today left leg doing well as well  Assessment: Doing well status post bilateral above-knee amputations  Plan: Follow-up on as-needed basis.  Ruta Hinds, MD [facility] Office: [phone] Pager: [phone]

## 2015-06-08 NOTE — Progress Notes (Signed)
Filed Vitals:   06/08/15 1055 06/08/15 1100  BP: 144/56 155/53  Pulse: 70   Temp: 97.7 F (36.5 C)   TempSrc: Oral   Resp: 20

## 2015-06-29 ENCOUNTER — Observation Stay (HOSPITAL_COMMUNITY)
Admission: EM | Admit: 2015-06-29 | Discharge: 2015-06-30 | Disposition: A | Discharge status: Home / Self Care | Payer: Medicare Other | Attending: Cardiology | Admitting: Cardiology

## 2015-06-29 ENCOUNTER — Emergency Department (HOSPITAL_COMMUNITY): Payer: Medicare Other

## 2015-06-29 ENCOUNTER — Encounter (HOSPITAL_COMMUNITY): Payer: Self-pay | Admitting: Emergency Medicine

## 2015-06-29 DIAGNOSIS — R072 Precordial pain: Principal | ICD-10-CM | POA: Diagnosis present

## 2015-06-29 DIAGNOSIS — E1122 Type 2 diabetes mellitus with diabetic chronic kidney disease: Secondary | ICD-10-CM | POA: Insufficient documentation

## 2015-06-29 DIAGNOSIS — R748 Abnormal levels of other serum enzymes: Secondary | ICD-10-CM | POA: Insufficient documentation

## 2015-06-29 DIAGNOSIS — F17200 Nicotine dependence, unspecified, uncomplicated: Secondary | ICD-10-CM | POA: Diagnosis not present

## 2015-06-29 DIAGNOSIS — Z8249 Family history of ischemic heart disease and other diseases of the circulatory system: Secondary | ICD-10-CM | POA: Diagnosis not present

## 2015-06-29 DIAGNOSIS — Z7982 Long term (current) use of aspirin: Secondary | ICD-10-CM | POA: Diagnosis not present

## 2015-06-29 DIAGNOSIS — Z833 Family history of diabetes mellitus: Secondary | ICD-10-CM | POA: Insufficient documentation

## 2015-06-29 DIAGNOSIS — Z89611 Acquired absence of right leg above knee: Secondary | ICD-10-CM | POA: Insufficient documentation

## 2015-06-29 DIAGNOSIS — Z79899 Other long term (current) drug therapy: Secondary | ICD-10-CM | POA: Diagnosis not present

## 2015-06-29 DIAGNOSIS — I252 Old myocardial infarction: Secondary | ICD-10-CM | POA: Insufficient documentation

## 2015-06-29 DIAGNOSIS — L97519 Non-pressure chronic ulcer of other part of right foot with unspecified severity: Secondary | ICD-10-CM | POA: Diagnosis not present

## 2015-06-29 DIAGNOSIS — Z8379 Family history of other diseases of the digestive system: Secondary | ICD-10-CM | POA: Diagnosis not present

## 2015-06-29 DIAGNOSIS — Z951 Presence of aortocoronary bypass graft: Secondary | ICD-10-CM | POA: Insufficient documentation

## 2015-06-29 DIAGNOSIS — N189 Chronic kidney disease, unspecified: Secondary | ICD-10-CM | POA: Diagnosis not present

## 2015-06-29 DIAGNOSIS — F1020 Alcohol dependence, uncomplicated: Secondary | ICD-10-CM | POA: Insufficient documentation

## 2015-06-29 DIAGNOSIS — I255 Ischemic cardiomyopathy: Secondary | ICD-10-CM | POA: Diagnosis not present

## 2015-06-29 DIAGNOSIS — Z89612 Acquired absence of left leg above knee: Secondary | ICD-10-CM | POA: Insufficient documentation

## 2015-06-29 DIAGNOSIS — I130 Hypertensive heart and chronic kidney disease with heart failure and stage 1 through stage 4 chronic kidney disease, or unspecified chronic kidney disease: Secondary | ICD-10-CM | POA: Insufficient documentation

## 2015-06-29 DIAGNOSIS — Z8489 Family history of other specified conditions: Secondary | ICD-10-CM | POA: Insufficient documentation

## 2015-06-29 DIAGNOSIS — I739 Peripheral vascular disease, unspecified: Secondary | ICD-10-CM | POA: Insufficient documentation

## 2015-06-29 DIAGNOSIS — Q359 Cleft palate, unspecified: Secondary | ICD-10-CM | POA: Diagnosis not present

## 2015-06-29 DIAGNOSIS — R7989 Other specified abnormal findings of blood chemistry: Secondary | ICD-10-CM | POA: Diagnosis present

## 2015-06-29 DIAGNOSIS — R079 Chest pain, unspecified: Secondary | ICD-10-CM | POA: Diagnosis present

## 2015-06-29 DIAGNOSIS — E785 Hyperlipidemia, unspecified: Secondary | ICD-10-CM | POA: Diagnosis not present

## 2015-06-29 DIAGNOSIS — D62 Acute posthemorrhagic anemia: Secondary | ICD-10-CM | POA: Diagnosis not present

## 2015-06-29 DIAGNOSIS — Z881 Allergy status to other antibiotic agents status: Secondary | ICD-10-CM | POA: Diagnosis not present

## 2015-06-29 DIAGNOSIS — Z7951 Long term (current) use of inhaled steroids: Secondary | ICD-10-CM | POA: Insufficient documentation

## 2015-06-29 DIAGNOSIS — R0602 Shortness of breath: Secondary | ICD-10-CM | POA: Diagnosis not present

## 2015-06-29 DIAGNOSIS — K219 Gastro-esophageal reflux disease without esophagitis: Secondary | ICD-10-CM | POA: Diagnosis not present

## 2015-06-29 DIAGNOSIS — R778 Other specified abnormalities of plasma proteins: Secondary | ICD-10-CM

## 2015-06-29 DIAGNOSIS — I25119 Atherosclerotic heart disease of native coronary artery with unspecified angina pectoris: Secondary | ICD-10-CM | POA: Diagnosis not present

## 2015-06-29 DIAGNOSIS — Z888 Allergy status to other drugs, medicaments and biological substances status: Secondary | ICD-10-CM | POA: Insufficient documentation

## 2015-06-29 DIAGNOSIS — J90 Pleural effusion, not elsewhere classified: Secondary | ICD-10-CM | POA: Insufficient documentation

## 2015-06-29 DIAGNOSIS — I447 Left bundle-branch block, unspecified: Secondary | ICD-10-CM | POA: Diagnosis not present

## 2015-06-29 DIAGNOSIS — Z794 Long term (current) use of insulin: Secondary | ICD-10-CM | POA: Insufficient documentation

## 2015-06-29 DIAGNOSIS — J449 Chronic obstructive pulmonary disease, unspecified: Secondary | ICD-10-CM | POA: Diagnosis not present

## 2015-06-29 DIAGNOSIS — Z9049 Acquired absence of other specified parts of digestive tract: Secondary | ICD-10-CM | POA: Diagnosis not present

## 2015-06-29 DIAGNOSIS — Z9981 Dependence on supplemental oxygen: Secondary | ICD-10-CM | POA: Diagnosis not present

## 2015-06-29 DIAGNOSIS — E871 Hypo-osmolality and hyponatremia: Secondary | ICD-10-CM | POA: Insufficient documentation

## 2015-06-29 DIAGNOSIS — I5022 Chronic systolic (congestive) heart failure: Secondary | ICD-10-CM | POA: Diagnosis not present

## 2015-06-29 DIAGNOSIS — I25118 Atherosclerotic heart disease of native coronary artery with other forms of angina pectoris: Secondary | ICD-10-CM | POA: Insufficient documentation

## 2015-06-29 LAB — COMPREHENSIVE METABOLIC PANEL
ALT: 8 U/L — AB (ref 17–63)
AST: 16 U/L (ref 15–41)
Albumin: 1.8 g/dL — ABNORMAL LOW (ref 3.5–5.0)
Alkaline Phosphatase: 52 U/L (ref 38–126)
Anion gap: 5 (ref 5–15)
BUN: 10 mg/dL (ref 6–20)
CHLORIDE: 98 mmol/L — AB (ref 101–111)
CO2: 30 mmol/L (ref 22–32)
Calcium: 7.8 mg/dL — ABNORMAL LOW (ref 8.9–10.3)
Creatinine, Ser: 0.64 mg/dL (ref 0.61–1.24)
GFR calc Af Amer: >60 mL/min (ref >60)
GFR calc non Af Amer: >60 mL/min (ref >60)
Glucose, Bld: 453 mg/dL — ABNORMAL HIGH (ref 65–99)
POTASSIUM: 2.9 mmol/L — AB (ref 3.5–5.1)
SODIUM: 133 mmol/L — AB (ref 135–145)
Total Bilirubin: 0.1 mg/dL — ABNORMAL LOW (ref 0.3–1.2)
Total Protein: 5.1 g/dL — ABNORMAL LOW (ref 6.5–8.1)

## 2015-06-29 LAB — CBC
HCT: 24.5 % — ABNORMAL LOW (ref 39.0–52.0)
Hemoglobin: 7.9 g/dL — ABNORMAL LOW (ref 13.0–17.0)
MCH: 27.7 pg (ref 26.0–34.0)
MCHC: 32.2 g/dL (ref 30.0–36.0)
MCV: 86 fL (ref 78.0–100.0)
PLATELETS: 339 10*3/uL (ref 150–400)
RBC: 2.85 MIL/uL — AB (ref 4.22–5.81)
RDW: 16.7 % — ABNORMAL HIGH (ref 11.5–15.5)
WBC: 8.2 10*3/uL (ref 4.0–10.5)

## 2015-06-29 LAB — I-STAT TROPONIN, ED: Troponin i, poc: 0.15 ng/mL (ref 0.00–0.08)

## 2015-06-29 LAB — POC OCCULT BLOOD, ED: FECAL OCCULT BLD: NEGATIVE

## 2015-06-29 MED ORDER — SODIUM CHLORIDE 0.9 % IV BOLUS (SEPSIS)
500.0000 mL | Freq: Once | INTRAVENOUS | Status: AC
Start: 2015-06-29 — End: 2015-06-30
  Administered 2015-06-29: 500 mL via INTRAVENOUS

## 2015-06-29 MED ORDER — HEPARIN (PORCINE) IN NACL 100-0.45 UNIT/ML-% IJ SOLN
800.0000 [IU]/h | INTRAMUSCULAR | Status: DC
  Administered 2015-06-29: 800 [IU]/h via INTRAVENOUS
  Filled 2015-06-29: qty 250

## 2015-06-29 MED ORDER — INSULIN ASPART 100 UNIT/ML ~~LOC~~ SOLN
12.0000 [IU] | Freq: Once | SUBCUTANEOUS | Status: AC
  Administered 2015-06-29: 12 [IU] via SUBCUTANEOUS
  Filled 2015-06-29: qty 1

## 2015-06-29 MED ORDER — SODIUM CHLORIDE 0.9 % IV SOLN
INTRAVENOUS | Status: DC
  Administered 2015-06-30: 20 mL/h via INTRAVENOUS

## 2015-06-29 MED ORDER — POTASSIUM CHLORIDE CRYS ER 20 MEQ PO TBCR
40.0000 meq | EXTENDED_RELEASE_TABLET | Freq: Once | ORAL | Status: AC
  Administered 2015-06-29: 40 meq via ORAL
  Filled 2015-06-29: qty 2

## 2015-06-29 MED ORDER — HEPARIN BOLUS VIA INFUSION
3000.0000 [IU] | Freq: Once | INTRAVENOUS | Status: AC
  Administered 2015-06-29: 3000 [IU] via INTRAVENOUS
  Filled 2015-06-29: qty 3000

## 2015-06-29 NOTE — H&P (Signed)
CARDIOLOGY ADMISSION NOTE  Patient ID: CORT DRAGOO MRN: [id number] DOB/AGE: Sep 09, 1941 74 y.o.  Admit date: 06/29/2015 Primary Physician   [name], PA-C Primary Cardiologist   Dr. Bronson Ing Chief Complaint    Chest pain  HPI: He reports that this started at 6:30 this evening.  It was in his chest mid and he might have had some back pain with this.  He does not report that it was like previous angina.  However, it did improve after NTG at home.  However, it returned.  He did not have associated symptoms such as nausea or vomiting. He has chronic dyspnea.  The patient denies any new symptoms such as PND or orthopnea. There have been no reported palpitations, presyncope or syncope.  In the ED he is currently pain free.   POC troponin was slightly elevated.   EKG shows chronic LBBB. The patient reports that when he was having the pain it was "like a spike" and 9/10.  He has otherwise been in his usual state of health.  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  He wears O2 prn and is still smoking.   The patient was recently admitted for a treatment of a scrotal cellulitis.  He has CAD and multiple other medical issues.  Last cath was 2014 as below with patent bypass grafts.  EF on echo in Feb of this year was 30 - 35%.  He has mild to moderate MR.   During the admission in Feb he similarly had an elevated troponin that was thought to be demand ischemia.  He was managed medically.    Past Medical History  Diagnosis Date  . Hypertension   . Pancreatitis   . Neuropathy   . Hyperlipemia   . Peripheral vascular disease     a. 10/2013: severely abnormal ABIs, for OP evaluation.  [name] GERD (gastroesophageal reflux disease)   . PUD (peptic ulcer disease)     diagnosed via EGD by Dr. Braulio Bosch at [place]  . Diverticulosis   . Hemorrhoids   . Coronary artery disease      a. BMS to LAD 1999. b. HSRA to LAD and stent to RCA 2007. c. NSTEMI/CABG x 3 in 04/2010. d. NSTEMI 10/2013 -  secondary to demand ischemia in setting of COPD exacerbation with severe underlying CAD - cath with 3/3 patent grafts, for medical therapy.   [name] COPD (chronic obstructive pulmonary disease)     a. Ongoing tobacco abuse.  . Arthritis   . Neutropenia     a. Dx 10/2013 on labs - instructed to f/u PCP.  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   . Ischemic cardiomyopathy     a. previously EF 30% in 2011. b. 10/2013: EF 45-50% by echo.  . Alcoholism     a. Remote alcoholism  . 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  [name] Ulcer of right foot   . Chronic kidney disease   . Sinusitis   . Myocardial infarction   . Shortness of breath dyspnea   . History of hiatal hernia   . Pneumonia     "a couple times; years ago' (05/03/2015)  . Type II diabetes mellitus   . CHF (congestive heart failure)   . Anemia     Past Surgical History  Procedure Laterality Date  . Coronary artery bypass  graft  June 2011    X3  . Inguinal hernia repair Right   . Cholecystectomy      3-4 years ago  . Appendectomy  ~ 1962  . Cataract extraction w/ intraocular lens implant Right   . Colonoscopy  05/12/2002    Dr. [name]- diverticulosis, anal papilla, internal hemorrhoids, rectal polyps  . [name] dilation  04/16/2012    Schatzki's ring-status post dilation and disruption/ as described above. Grade 1 esophageal varices. Small hiatal hernia.  [name] dilation  04/16/2012    Procedure: [name] DILATION;  Surgeon: Daneil Dolin, MD;  Location: [facility];  Service: Endoscopy;  Laterality: N/A;  . Colonoscopy  08/10/2012    Procedure: COLONOSCOPY;  Surgeon: Daneil Dolin, MD;  Location: [facility];  Service: Endoscopy;  Laterality: N/A;  9:45 needs 30 mins extra /have Glucagon on hand  . Left heart catheterization with coronary/graft angiogram N/A 10/29/2013    Procedure: LEFT HEART CATHETERIZATION WITH  [name];  Surgeon: Burnell Blanks, MD;  Location: [facility] CATH LAB;  Service: Cardiovascular;  Laterality: N/A;  . Abdominal aortagram N/A 09/09/2014    Procedure: ABDOMINAL Maxcine Ham;  Surgeon: Elam Dutch, MD;  Location: [facility] CATH LAB;  Service: Cardiovascular;  Laterality: N/A;  . Atherectomy Bilateral 09/23/2014    Procedure: ATHERECTOMY;  Surgeon: Elam Dutch, MD;  Location: [facility] CATH LAB;  Service: Cardiovascular;  Laterality: Bilateral;  iliacs  . Amputation Left 12/05/2014    Procedure: AMPUTATION ABOVE KNEE;  Surgeon: Elam Dutch, MD;  Location: [facility];  Service: Vascular;  Laterality: Left;  [name] Tympanoplasty Right   . 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 Esophagogastroduodenoscopy endoscopy  2003    Dr. Braulio Bosch: erosive reflux esophagitis , PUD  . Colonoscopy  05/2002    hyperplastic polyp, diverticulosis, anal papilla and internal hemorrhoids r  . Amputation Right 05/03/2015    Procedure: RIGHT ABOVE KNEE AMPUTATION;  Surgeon: Elam Dutch, MD;  Location: [facility];  Service: Vascular;  Laterality: Right;    Allergies  Allergen Reactions  . Lovaza [Omega-3-Acid Ethyl Esters] Itching  . Lyrica [Pregabalin] Hives  . Zithromax [Azithromycin]     rash  . Levaquin [Levofloxacin] Hives   No current facility-administered medications on file prior to encounter.   Current Outpatient Prescriptions on File Prior to Encounter  Medication Sig Dispense Refill  . albuterol-ipratropium (COMBIVENT) 18-103 MCG/ACT inhaler Inhale 1-2 puffs into the lungs every 6 (six) hours as needed for wheezing or shortness of breath.    [name] amoxicillin-clavulanate (AUGMENTIN) 500-125 MG per tablet Take 1 tablet (500 mg total) by mouth 2 (two) times daily. 10 tablet 0  . aspirin 325 MG tablet Take 325 mg by mouth daily.    [name] atorvastatin (LIPITOR) 40 MG tablet Take 40 mg by mouth daily.    . fenofibrate 160 MG tablet Take 160 mg by mouth daily with breakfast.     .  fluconazole (DIFLUCAN) 50 MG tablet Take 1 tablet (50 mg total) by mouth daily. 5 tablet 0  . furosemide (LASIX) 20 MG tablet Take 40 mg by mouth daily.    [name] HYDROcodone-acetaminophen (NORCO/VICODIN) 5-325 MG per tablet Take 1 tablet by mouth 4 (four) times daily as needed for moderate pain. 30 tablet 0  . insulin aspart (NOVOLOG) 100 UNIT/ML injection Inject 0-15 Units into the skin 3 (three) times daily with meals. 10 mL 11  . insulin glargine (LANTUS) 100 UNIT/ML injection Inject 0.1-0.3 mLs (10-30 Units  total) into the skin daily as needed (for elevated blood sugar levels).    [name] levalbuterol (XOPENEX) 1.25 MG/3ML nebulizer solution Take 0.63 mg by nebulization every 4 (four) hours as needed for wheezing or shortness of breath.    . lisinopril (PRINIVIL,ZESTRIL) 20 MG tablet Take 20 mg by mouth daily.     [name] loratadine (CLARITIN) 10 MG tablet Take 10 mg by mouth daily.    . magnesium oxide (MAG-OX) 400 MG tablet Take 1 tablet (400 mg total) by mouth daily. 14 tablet 0  . metFORMIN (GLUCOPHAGE) 1000 MG tablet Take 1,000 mg by mouth 2 (two) times daily with a meal.     . metolazone (ZAROXOLYN) 2.5 MG tablet TAKE ONE (1) TABLET EACH DAY 30 tablet 0  . metoprolol tartrate (LOPRESSOR) 25 MG tablet Take 25 mg by mouth 2 (two) times daily.     . nitroGLYCERIN (NITROSTAT) 0.4 MG SL tablet Place 0.4 mg under the tongue every 5 (five) minutes x 3 doses as needed for chest pain.     . Pancrelipase, Lip-Prot-Amyl, (CREON) 24000 UNITS CPEP Take 48,000-72,000 Units by mouth See admin instructions. Take 72000 units 3 times daily with all meals and take 48000 units with snacks    . potassium chloride 20 MEQ TBCR Take 1 tab twice daily for 14 days (Patient taking differently: Take 20 mEq by mouth 2 (two) times daily. ) 28 tablet 0  . SYMBICORT 160-4.5 MCG/ACT inhaler Inhale 1-2 puffs into the lungs 2 (two) times daily as needed (shorntess of breath).     . tamsulosin (FLOMAX) 0.4 MG CAPS capsule Take 0.4 mg by  mouth at bedtime.    . traZODone (DESYREL) 50 MG tablet Take 50 mg by mouth at bedtime as needed for sleep.     . OXYGEN Inhale 3 L into the lungs daily as needed (shortess of breath).     Social History   Social History  . Marital Status: Married    Spouse Name: N/A  . Number of Children: N/A  . Years of Education: N/A   Occupational History  . Disabled    Social History Main Topics  . Smoking status: Current Every Day Smoker -- 1.00 packs/day for 65 years    Types: Cigarettes  . Smokeless tobacco: Former Systems developer    Types: Snuff     Comment: 05/03/2015 "dipped snuff years and years ago"  . Alcohol Use: No     Comment: Quit drinking in 1985  . Drug Use: No  . Sexual Activity: No   Other Topics Concern  . Not on file   Social History Narrative    Family History  Problem Relation Age of Onset  . Coronary artery disease Mother   . Coronary artery disease Father   . Diabetes Sister   . Suicidality Brother   . Cirrhosis Brother   . Colon cancer Neg Hx      ROS:  Feels hot, headache, constipation.  Otherwise as stated in the HPI and negative for all other systems.  Physical Exam: Blood pressure 153/66, pulse 89, temperature 98.1 F (36.7 C), temperature source Oral, resp. rate 16, SpO2 98 %.  GENERAL:  Chronically ill appearing HEENT:  Pupils equal round and reactive, fundi not visualized, oral mucosa unremarkable, edentulous NECK:  No jugular venous distention, waveform within normal limits, carotid upstroke brisk and symmetric, right bruit, no thyromegaly LYMPHATICS:  No cervical, inguinal adenopathy LUNGS:  Clear to auscultation bilaterally BACK:  No CVA tenderness CHEST:  Well  healed sternotomy scar.  (Popping in chest from surgical non union) HEART:  PMI not displaced or sustained,S1 and S2 within normal limits, no S3, no S4, no clicks, no rubs, no murmurs ABD:  Flat, positive bowel sounds normal in frequency in pitch, no bruits, no rebound, no guarding, no midline  pulsatile mass, no hepatomegaly, no splenomegaly EXT:  2 plus pulses upper, bilateral AKA.   SKIN:  No rashes no nodules, bruising and multiple small wounds on hands NEURO:  Cranial nerves II through XII grossly intact, motor grossly intact throughout PSYCH:  Cognitively intact, oriented to person place and time, sleepy    Labs: Lab Results  Component Value Date   BUN 12 06/05/2015   Lab Results  Component Value Date   CREATININE 0.86 06/05/2015   Lab Results  Component Value Date   NA 127* 06/05/2015   K 4.4 06/05/2015   CL 90* 06/05/2015   CO2 30 06/05/2015   Lab Results  Component Value Date   TROPONINI <0.03 05/20/2015   Lab Results  Component Value Date   WBC 8.2 06/29/2015   HGB 7.9* 06/29/2015   HCT 24.5* 06/29/2015   MCV 86.0 06/29/2015   PLT 339 06/29/2015    Lab Results  Component Value Date   ALT 8* 06/05/2015   AST 13* 06/05/2015   ALKPHOS 81 06/05/2015   BILITOT 0.2* 06/05/2015      Radiology:  CXR:  Worsening bibasilar aeration with increasing left pleural effusion a retrocardiac opacity and progressive right basilar atelectasis. Mild vascular congestion without overt edema. Findings may reflect fluid overload/CHF. Aspiration or pneumonia could have a similar appearance.  EKG:  NSR, LBBB, rate 89, no change from previous.  06/29/2015  ASSESSMENT AND PLAN:    CHEST PAIN:  He has some typical and atypical features.  However, he is currently pain free.  Given the fact that he has had multiple visits for chest pain I would suggest screening with a Lexiscan Myoview if he has no further pain and his enzymes do not trend significantly upward.    CARDIOMYOPATHY:  He seems to be somewhat euvolemic.  He will continue the meds as listed.  HYPONATREMIA:  I will order a free water restriction and we can follow this.    ANEMIA:  This is lower than his chronic.  He denies any active bleeding.  Follow CBC and check a stool guaiac.  TOBACCO:  He understands  the need to stop smoking.  DM:  Continue previous meds.   Last A1C was 9.6.  He needs continued outpatient med titration.  RISK REDUCTION: I don't see a recent lipid profile.  I will order one.    SignedMinus Breeding 06/29/2015, 10:41 PM

## 2015-06-29 NOTE — ED Notes (Signed)
Per EMS pt began having chest pain around 6:30pm.  He ranks the left sided chest pain a 10/10, took a total of 2 nitro w/ little relief (6/10).  He also had 324 of ASP inroute.  Hx of open heart surgery 7 years ago.  Bilateral above the knee amputee, diabetic, hypertension.  He is continually on 3L of O2 at home.

## 2015-06-29 NOTE — ED Provider Notes (Addendum)
CSN: [id number]     Arrival date & time 06/29/15  2128 History   First MD Initiated Contact with Patient 06/29/15 2146     Chief Complaint  Patient presents with  . Chest Pain     (Consider location/radiation/quality/duration/timing/severity/associated sxs/prior Treatment) Patient is a 74 y.o. male presenting with chest pain. The history is provided by the patient and the EMS personnel.  Chest Pain Associated symptoms: no abdominal pain, no back pain, no fever, no headache, no nausea, no palpitations, no shortness of breath and not vomiting   Patient w hx cad, s/p cabg approx 5 yrs ago, presents c/o acute onset mid sternal chest pain at 630 tonight, at rest. Pt indicates symptoms not like his prior discomfort/cardiac cp. Pain was dull, moderate, non radiating. Improved shortly after taking a ntg sl at home but then recurred several minutes later. Pain not pleuritic, and intermittent. occ non prod cough, no other uri c/o. No fever or chills. +sob. No nv or diaphoresis. At baseline, hx copd, on home o2 3 liters continuously. +smoker. No leg pain or swelling. Compliant w normal meds, takes an asa a day.  Cp currently resolved.      Past Medical History  Diagnosis Date  . Hypertension   . Pancreatitis   . Neuropathy   . Hyperlipemia   . Peripheral vascular disease     a. 10/2013: severely abnormal ABIs, for OP evaluation.  [name] GERD (gastroesophageal reflux disease)   . PUD (peptic ulcer disease)     diagnosed via EGD by Dr. Braulio Bosch at [place]  . Diverticulosis   . Hemorrhoids   . Coronary artery disease      a. BMS to LAD 1999. b. HSRA to LAD and stent to RCA 2007. c. NSTEMI/CABG x 3 in 04/2010. d. NSTEMI 10/2013 - secondary to demand ischemia in setting of COPD exacerbation with severe underlying CAD - cath with 3/3 patent grafts, for medical therapy.   [name] COPD (chronic obstructive pulmonary disease)     a. Ongoing tobacco abuse.  . Arthritis   . Neutropenia     a. Dx 10/2013 on  labs - instructed to f/u PCP.  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   . Ischemic cardiomyopathy     a. previously EF 30% in 2011. b. 10/2013: EF 45-50% by echo.  . Alcoholism     a. Remote alcoholism  . 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  [name] Ulcer of right foot   . Chronic kidney disease   . Sinusitis   . Myocardial infarction   . Shortness of breath dyspnea   . History of hiatal hernia   . Pneumonia     "a couple times; years ago' (05/03/2015)  . Type II diabetes mellitus   . CHF (congestive heart failure)   . Anemia    Past Surgical History  Procedure Laterality Date  . Coronary artery bypass graft  June 2011    X3  . Inguinal hernia repair Right   . Cholecystectomy      3-4 years ago  . Appendectomy  ~ 1962  . Cataract extraction w/ intraocular lens implant Right   . Colonoscopy  05/12/2002    Dr. [name]- diverticulosis, anal papilla, internal hemorrhoids, rectal polyps  . [name] dilation  04/16/2012    Schatzki's ring-status post dilation and disruption/ as described above. Grade 1  esophageal varices. Small hiatal hernia.  [name] dilation  04/16/2012    Procedure: [name] DILATION;  Surgeon: Daneil Dolin, MD;  [facility];  Service: Endoscopy;  Laterality: N/A;  . Colonoscopy  08/10/2012    Procedure: COLONOSCOPY;  Surgeon: Daneil Dolin, MD;  Location: [facility];  Service: Endoscopy;  Laterality: N/A;  9:45 needs 30 mins extra /have Glucagon on hand  . Left heart catheterization with coronary/graft angiogram N/A 10/29/2013    Procedure: LEFT HEART CATHETERIZATION WITH [name];  Surgeon: Burnell Blanks, MD;  Location: [facility] CATH LAB;  Service: Cardiovascular;  Laterality: N/A;  . Abdominal aortagram N/A 09/09/2014    Procedure: ABDOMINAL Maxcine Ham;  Surgeon: Elam Dutch, MD;  Location: [facility] CATH LAB;  Service:  Cardiovascular;  Laterality: N/A;  . Atherectomy Bilateral 09/23/2014    Procedure: ATHERECTOMY;  Surgeon: Elam Dutch, MD;  Location: [facility] CATH LAB;  Service: Cardiovascular;  Laterality: Bilateral;  iliacs  . Amputation Left 12/05/2014    Procedure: AMPUTATION ABOVE KNEE;  Surgeon: Elam Dutch, MD;  Location: [place];  Service: Vascular;  Laterality: Left;  [name] Tympanoplasty Right   . 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 Esophagogastroduodenoscopy endoscopy  2003    Dr. Braulio Bosch: erosive reflux esophagitis , PUD  . Colonoscopy  05/2002    hyperplastic polyp, diverticulosis, anal papilla and internal hemorrhoids r  . Amputation Right 05/03/2015    Procedure: RIGHT ABOVE KNEE AMPUTATION;  Surgeon: Elam Dutch, MD;  Location: [facility] OR;  Service: Vascular;  Laterality: Right;   Family History  Problem Relation Age of Onset  . Coronary artery disease Mother   . Coronary artery disease Father   . Diabetes Sister   . Suicidality Brother   . Cirrhosis Brother   . Colon cancer Neg Hx    Social History  Substance Use Topics  . Smoking status: Current Every Day Smoker -- 1.00 packs/day for 65 years    Types: Cigarettes  . Smokeless tobacco: Former Systems developer    Types: Snuff     Comment: 05/03/2015 "dipped snuff years and years ago"  . Alcohol Use: No     Comment: Quit drinking in 1985    Review of Systems  Constitutional: Negative for fever.  HENT: Negative for sore throat.   Eyes: Negative for redness.  Respiratory: Negative for shortness of breath.   Cardiovascular: Positive for chest pain. Negative for palpitations and leg swelling.  Gastrointestinal: Negative for nausea, vomiting and abdominal pain.  Genitourinary: Negative for flank pain.  Musculoskeletal: Negative for back pain and neck pain.  Skin: Negative for rash.  Neurological: Negative for headaches.  Hematological: Does not bruise/bleed easily.  Psychiatric/Behavioral: Negative for confusion.       Allergies  Lovaza; Lyrica; Zithromax; and Levaquin  Home Medications   Prior to Admission medications   Medication Sig Start Date End Date Taking? Authorizing Provider  albuterol-ipratropium (COMBIVENT) 18-103 MCG/ACT inhaler Inhale 1-2 puffs into the lungs every 6 (six) hours as needed for wheezing or shortness of breath. 11/02/13   Dayna N Dunn, PA-C  amoxicillin-clavulanate (AUGMENTIN) 500-125 MG per tablet Take 1 tablet (500 mg total) by mouth 2 (two) times daily. 05/30/15   Radene Gunning, NP  aspirin 325 MG tablet Take 325 mg by mouth daily.    Historical Provider, MD  atorvastatin (LIPITOR) 40 MG tablet Take 40 mg by mouth daily.    Historical Provider, MD  fenofibrate 160 MG tablet Take 160  mg by mouth daily with breakfast.     Historical Provider, MD  fluconazole (DIFLUCAN) 50 MG tablet Take 1 tablet (50 mg total) by mouth daily. 05/30/15   Radene Gunning, NP  furosemide (LASIX) 20 MG tablet Take 40 mg by mouth daily.    Historical Provider, MD  HYDROcodone-acetaminophen (NORCO/VICODIN) 5-325 MG per tablet Take 1 tablet by mouth 4 (four) times daily as needed for moderate pain. 05/23/15   Costin Karlyne Greenspan, MD  insulin aspart (NOVOLOG) 100 UNIT/ML injection Inject 0-15 Units into the skin 3 (three) times daily with meals. 12/09/14   Silver Huguenin Elgergawy, MD  insulin glargine (LANTUS) 100 UNIT/ML injection Inject 0.1-0.3 mLs (10-30 Units total) into the skin daily as needed (for elevated blood sugar levels). 05/30/15   Radene Gunning, NP  levalbuterol Penne Lash) 1.25 MG/3ML nebulizer solution Take 0.63 mg by nebulization every 4 (four) hours as needed for wheezing or shortness of breath. 05/30/15   Radene Gunning, NP  lisinopril (PRINIVIL,ZESTRIL) 20 MG tablet Take 20 mg by mouth daily.     Historical Provider, MD  loratadine (CLARITIN) 10 MG tablet Take 10 mg by mouth daily.    Historical Provider, MD  magnesium oxide (MAG-OX) 400 MG tablet Take 1 tablet (400 mg total) by mouth daily. 05/30/15    Radene Gunning, NP  metFORMIN (GLUCOPHAGE) 1000 MG tablet Take 1,000 mg by mouth 2 (two) times daily with a meal.     Historical Provider, MD  metolazone (ZAROXOLYN) 2.5 MG tablet TAKE ONE (1) TABLET EACH DAY 10/03/14   Lendon Colonel, NP  metoprolol tartrate (LOPRESSOR) 25 MG tablet Take 25 mg by mouth 2 (two) times daily.     Historical Provider, MD  nitroGLYCERIN (NITROSTAT) 0.4 MG SL tablet Place 0.4 mg under the tongue every 5 (five) minutes x 3 doses as needed for chest pain.     Historical Provider, MD  OXYGEN Inhale 3 L into the lungs daily as needed (shortess of breath).    Historical Provider, MD  Pancrelipase, Lip-Prot-Amyl, (CREON) 24000 UNITS CPEP Take 48,000-72,000 Units by mouth See admin instructions. Take 72000 units 3 times daily with all meals and take 48000 units with snacks    Historical Provider, MD  potassium chloride 20 MEQ TBCR Take 1 tab twice daily for 14 days Patient taking differently: Take 20 mEq by mouth 2 (two) times daily.  05/30/15   Radene Gunning, NP  SYMBICORT 160-4.5 MCG/ACT inhaler Inhale 1-2 puffs into the lungs 2 (two) times daily as needed (shorntess of breath).  07/04/12   Historical Provider, MD  tamsulosin (FLOMAX) 0.4 MG CAPS capsule Take 0.4 mg by mouth at bedtime.    Historical Provider, MD  traZODone (DESYREL) 50 MG tablet Take 50 mg by mouth at bedtime as needed for sleep.     Historical Provider, MD   BP 153/66 mmHg  Pulse 89  Temp(Src) 98.1 F (36.7 C) (Oral)  Resp 16  SpO2 98% Physical Exam  Constitutional: He is oriented to person, place, and time. He appears well-developed and well-nourished. No distress.  HENT:  Head: Atraumatic.  Eyes: Conjunctivae are normal. No scleral icterus.  Neck: Neck supple. No tracheal deviation present.  Cardiovascular: Normal rate, regular rhythm, normal heart sounds and intact distal pulses.  Exam reveals no gallop and no friction rub.   No murmur heard. Pulmonary/Chest: Effort normal and breath sounds  normal. No accessory muscle usage. No respiratory distress. He exhibits no tenderness.  Abdominal: Soft.  Bowel sounds are normal. He exhibits no distension. There is no tenderness.  Genitourinary:  Light brown stool, hemoccult negative.   Musculoskeletal: Normal range of motion. He exhibits no edema or tenderness.  bil AKA  Neurological: He is alert and oriented to person, place, and time.  Skin: Skin is warm and dry.  Psychiatric: He has a normal mood and affect.  Nursing note and vitals reviewed.   ED Course  Procedures (including critical care time) Labs Review  Results for orders placed or performed during the hospital encounter of 06/29/15  CBC  Result Value Ref Range   WBC 8.2 4.0 - 10.5 K/uL   RBC 2.85 (L) 4.22 - 5.81 MIL/uL   Hemoglobin 7.9 (L) 13.0 - 17.0 g/dL   HCT 24.5 (L) 39.0 - 52.0 %   MCV 86.0 78.0 - 100.0 fL   MCH 27.7 26.0 - 34.0 pg   MCHC 32.2 30.0 - 36.0 g/dL   RDW 16.7 (H) 11.5 - 15.5 %   Platelets 339 150 - 400 K/uL  I-stat troponin, ED  Result Value Ref Range   Troponin i, poc 0.15 (HH) 0.00 - 0.08 ng/mL   Comment NOTIFIED PHYSICIAN    Comment 3           Dg Chest Port 1 View  06/29/2015   CLINICAL DATA:  Chest pain and shortness of breath today.  EXAM: PORTABLE CHEST - 1 VIEW  COMPARISON:  05/28/2015  FINDINGS: Patient is post median sternotomy, lower most sternal wire remains broken, unchanged. Cardiomediastinal contours are unchanged, heart at the upper limits normal in size. Increasing left pleural effusion with retrocardiac opacity. Worsening right basilar atelectasis. Mild vascular congestion without overt edema. No pneumothorax.  IMPRESSION: Worsening bibasilar aeration with increasing left pleural effusion a retrocardiac opacity and progressive right basilar atelectasis. Mild vascular congestion without overt edema. Findings may reflect fluid overload/CHF. Aspiration or pneumonia could have a similar appearance.   Electronically Signed   By: Jeb Levering M.D.   On: 06/29/2015 22:10     I have personally reviewed and evaluated these images and lab results as part of my medical decision-making.   EKG Interpretation   Date/Time:  Thursday June 29 2015 21:41:33 EDT Ventricular Rate:  88 PR Interval:  191 QRS Duration: 149 QT Interval:  441 QTC Calculation: 534 R Axis:   93 Text Interpretation:  Sinus rhythm Left bundle branch block `no acute  change compared to ecg 05/19/15 Confirmed by [facility]  MD, Lennette Bihari ([id number]) on  06/29/2015 9:50:34 PM      MDM   Iv ns. Continuous pulse ox and monitor. Pt had asa by ems. No chest pain currently.   Labs. Ecg. Cxr.  Reviewed nursing notes and prior charts for additional history.   Cardiology consulted as trop elev.  Cp free.   Given recurrent cp, elevated trop, will start on heparin per pharmacy (stool heme neg).   Discussed pt and elev trop with Dr Percival Spanish - he will see in ED.  Recheck pt, cp free.     Lajean Saver, MD 06/29/15 [phone]

## 2015-06-29 NOTE — Progress Notes (Addendum)
ANTICOAGULATION CONSULT NOTE - Initial Consult  Pharmacy Consult for heparin Indication: chest pain/ACS  Allergies  Allergen Reactions  . Lovaza [Omega-3-Acid Ethyl Esters] Itching  . Lyrica [Pregabalin] Hives  . Zithromax [Azithromycin]     rash  . Levaquin [Levofloxacin] Hives    Patient Measurements: Height: 5\' 10"  (177.8 cm) Weight: 151 lb 0.2 oz (68.5 kg) IBW/kg (Calculated) : 73  Heparin dosing wt: 68.5 kg  Vital Signs: Temp: 98.1 F (36.7 C) (08/25 2147) Temp Source: Oral (08/25 2147) BP: 153/66 mmHg (08/25 2147) Pulse Rate: 89 (08/25 2147)  Labs:  Recent Labs  06/29/15 2200  HGB 7.9*  HCT 24.5*  PLT 339     Medical History: Past Medical History  Diagnosis Date  . Hypertension   . Pancreatitis   . Neuropathy   . Hyperlipemia   . Peripheral vascular disease     a. 10/2013: severely abnormal ABIs, for OP evaluation.  [name] GERD (gastroesophageal reflux disease)   . PUD (peptic ulcer disease)     diagnosed via EGD by Dr. Braulio Bosch at [place]  . Diverticulosis   . Hemorrhoids   . Coronary artery disease      a. BMS to LAD 1999. b. HSRA to LAD and stent to RCA 2007. c. NSTEMI/CABG x 3 in 04/2010. d. NSTEMI 10/2013 - secondary to demand ischemia in setting of COPD exacerbation with severe underlying CAD - cath with 3/3 patent grafts, for medical therapy.   [name] COPD (chronic obstructive pulmonary disease)     a. Ongoing tobacco abuse.  . Arthritis   . Neutropenia     a. Dx 10/2013 on labs - instructed to f/u PCP.  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   . Ischemic cardiomyopathy     a. previously EF 30% in 2011. b. 10/2013: EF 45-50% by echo.  . Alcoholism     a. Remote alcoholism  . 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  [name] Ulcer of right foot   . Chronic kidney disease   . Sinusitis   . History of hiatal hernia   . Pneumonia     "a  couple times; years ago' (05/03/2015)  . Type II diabetes mellitus   . Anemia       Assessment: 74yo male c/o CP since 1830, some relief w/ NTG x2, i-stat troponin elevated, to begin IV heparin.  Goal of Therapy:  Heparin level 0.3-0.7 units/ml Monitor platelets by anticoagulation protocol: Yes   Plan:  Will give heparin 3000 units IV bolus x1 followed by gtt at 800 units/hr and monitor heparin levels and CBC.  Wynona Neat, PharmD, BCPS  06/29/2015,10:48 PM  _____________________________________________________ Jones Skene: 1st heparin level is undetectable at < 0.1 units/mL.  CBC stable, PLT wnl.  Per RN, heparin infusing without issue, no bleeding or other complications reported.  She also reports that pt is currently off the floor and new heparin orders may be delayed >> will time labs when heparin adjusted.  Goal of Therapy:  Heparin level 0.3-0.7 units/ml Monitor platelets by anticoagulation protocol: Yes   Plan:  -Heparin IV bolus 2000 units -Increase heparin drip to 1000 units/hr -Check 8hr HL -Monitor daily HL, CBC, s/sx of bleeding while on heparin drip  Drucie Opitz, PharmD Clinical Pharmacist Pager: [phone] 06/30/2015 8:51 AM

## 2015-06-30 ENCOUNTER — Observation Stay (HOSPITAL_COMMUNITY): Payer: Medicare Other

## 2015-06-30 DIAGNOSIS — D62 Acute posthemorrhagic anemia: Secondary | ICD-10-CM | POA: Diagnosis not present

## 2015-06-30 DIAGNOSIS — I25118 Atherosclerotic heart disease of native coronary artery with other forms of angina pectoris: Secondary | ICD-10-CM | POA: Diagnosis not present

## 2015-06-30 DIAGNOSIS — R7989 Other specified abnormal findings of blood chemistry: Secondary | ICD-10-CM | POA: Diagnosis not present

## 2015-06-30 DIAGNOSIS — R072 Precordial pain: Secondary | ICD-10-CM | POA: Diagnosis not present

## 2015-06-30 LAB — LIPID PANEL
CHOL/HDL RATIO: 1.9 ratio
CHOLESTEROL: 112 mg/dL (ref 0–200)
HDL: 60 mg/dL (ref >40)
LDL Cholesterol: 43 mg/dL (ref 0–99)
TRIGLYCERIDES: 44 mg/dL (ref <150)
VLDL: 9 mg/dL (ref 0–40)

## 2015-06-30 LAB — BASIC METABOLIC PANEL
Anion gap: 5 (ref 5–15)
BUN: 11 mg/dL (ref 6–20)
CO2: 30 mmol/L (ref 22–32)
Calcium: 7.9 mg/dL — ABNORMAL LOW (ref 8.9–10.3)
Chloride: 100 mmol/L — ABNORMAL LOW (ref 101–111)
Creatinine, Ser: 0.55 mg/dL — ABNORMAL LOW (ref 0.61–1.24)
Glucose, Bld: 140 mg/dL — ABNORMAL HIGH (ref 65–99)
POTASSIUM: 3.4 mmol/L — AB (ref 3.5–5.1)
SODIUM: 135 mmol/L (ref 135–145)

## 2015-06-30 LAB — NM MYOCAR MULTI W/SPECT W/WALL MOTION / EF
CHL CUP MPHR: 148 {beats}/min
CHL CUP NUCLEAR SDS: 10
CSEPEW: 1 METS
CSEPHR: 61 %
Exercise duration (min): 0 min
Exercise duration (sec): 0 s
LHR: 0.3
LV sys vol: 152 mL
LVDIAVOL: 217 mL
NUC STRESS TID: 0.8
Peak HR: 91 {beats}/min
Rest HR: 68 {beats}/min
SRS: 3
SSS: 13

## 2015-06-30 LAB — CBC
HEMATOCRIT: 25.2 % — AB (ref 39.0–52.0)
HEMOGLOBIN: 8.2 g/dL — AB (ref 13.0–17.0)
MCH: 28.5 pg (ref 26.0–34.0)
MCHC: 32.5 g/dL (ref 30.0–36.0)
MCV: 87.5 fL (ref 78.0–100.0)
Platelets: 304 10*3/uL (ref 150–400)
RBC: 2.88 MIL/uL — AB (ref 4.22–5.81)
RDW: 16.7 % — ABNORMAL HIGH (ref 11.5–15.5)
WBC: 7.8 10*3/uL (ref 4.0–10.5)

## 2015-06-30 LAB — GLUCOSE, CAPILLARY
Glucose-Capillary: 138 mg/dL — ABNORMAL HIGH (ref 65–99)
Glucose-Capillary: 222 mg/dL — ABNORMAL HIGH (ref 65–99)

## 2015-06-30 LAB — MRSA PCR SCREENING: MRSA by PCR: NEGATIVE

## 2015-06-30 LAB — HEPARIN LEVEL (UNFRACTIONATED)

## 2015-06-30 MED ORDER — HYDROCODONE-ACETAMINOPHEN 5-325 MG PO TABS: 1.0000 | ORAL_TABLET | Freq: Four times a day (QID) | ORAL | Status: DC | PRN

## 2015-06-30 MED ORDER — HEPARIN BOLUS VIA INFUSION
2000.0000 [IU] | Freq: Once | INTRAVENOUS | Status: AC
  Administered 2015-06-30: 2000 [IU] via INTRAVENOUS
  Filled 2015-06-30: qty 2000

## 2015-06-30 MED ORDER — FLUCONAZOLE 50 MG PO TABS
50.0000 mg | ORAL_TABLET | Freq: Every day | ORAL | Status: DC
  Administered 2015-06-30: 50 mg via ORAL
  Filled 2015-06-30: qty 1

## 2015-06-30 MED ORDER — ALBUTEROL SULFATE (2.5 MG/3ML) 0.083% IN NEBU
INHALATION_SOLUTION | RESPIRATORY_TRACT | Status: AC
  Filled 2015-06-30: qty 3

## 2015-06-30 MED ORDER — METOLAZONE 2.5 MG PO TABS
2.5000 mg | ORAL_TABLET | Freq: Every day | ORAL | Status: DC
  Administered 2015-06-30: 2.5 mg via ORAL
  Filled 2015-06-30: qty 1

## 2015-06-30 MED ORDER — ASPIRIN 325 MG PO TABS
325.0000 mg | ORAL_TABLET | Freq: Every day | ORAL | Status: DC
  Administered 2015-06-30: 325 mg via ORAL
  Filled 2015-06-30: qty 1

## 2015-06-30 MED ORDER — ACETAMINOPHEN 325 MG PO TABS: 650.0000 mg | ORAL_TABLET | ORAL | Status: DC | PRN

## 2015-06-30 MED ORDER — ALBUTEROL SULFATE (5 MG/ML) 0.5% IN NEBU: 2.5000 mg | INHALATION_SOLUTION | Freq: Four times a day (QID) | RESPIRATORY_TRACT | Status: DC

## 2015-06-30 MED ORDER — TRAZODONE HCL 50 MG PO TABS: 50.0000 mg | ORAL_TABLET | Freq: Every evening | ORAL | Status: DC | PRN

## 2015-06-30 MED ORDER — MAGNESIUM OXIDE 400 (241.3 MG) MG PO TABS
400.0000 mg | ORAL_TABLET | Freq: Every day | ORAL | Status: DC
  Filled 2015-06-30: qty 1

## 2015-06-30 MED ORDER — IPRATROPIUM-ALBUTEROL 0.5-2.5 (3) MG/3ML IN SOLN
RESPIRATORY_TRACT | Status: AC
  Administered 2015-06-30: 3 mL via RESPIRATORY_TRACT
  Filled 2015-06-30: qty 3

## 2015-06-30 MED ORDER — TECHNETIUM TC 99M SESTAMIBI - CARDIOLITE
30.0000 | Freq: Once | INTRAVENOUS | Status: AC | PRN
  Administered 2015-06-30: 12:00:00 30 via INTRAVENOUS

## 2015-06-30 MED ORDER — INSULIN ASPART 100 UNIT/ML ~~LOC~~ SOLN
0.0000 [IU] | Freq: Three times a day (TID) | SUBCUTANEOUS | Status: DC
Start: 2015-06-30 — End: 2015-06-30

## 2015-06-30 MED ORDER — FENOFIBRATE 160 MG PO TABS: 160.0000 mg | ORAL_TABLET | Freq: Every day | ORAL | Status: DC

## 2015-06-30 MED ORDER — BUDESONIDE-FORMOTEROL FUMARATE 160-4.5 MCG/ACT IN AERO: 1.0000 | INHALATION_SPRAY | Freq: Two times a day (BID) | RESPIRATORY_TRACT | Status: DC | PRN

## 2015-06-30 MED ORDER — INSULIN ASPART 100 UNIT/ML ~~LOC~~ SOLN: 0.0000 [IU] | Freq: Three times a day (TID) | SUBCUTANEOUS | Status: DC

## 2015-06-30 MED ORDER — MAGNESIUM OXIDE 400 MG PO TABS: 400.0000 mg | ORAL_TABLET | Freq: Every day | ORAL | Status: DC

## 2015-06-30 MED ORDER — PANCRELIPASE (LIP-PROT-AMYL) 12000-38000 UNITS PO CPEP: 48000.0000 [IU] | ORAL_CAPSULE | ORAL | Status: DC | PRN

## 2015-06-30 MED ORDER — PANCRELIPASE (LIP-PROT-AMYL) 12000-38000 UNITS PO CPEP: 48000.0000 [IU] | ORAL_CAPSULE | ORAL | Status: DC

## 2015-06-30 MED ORDER — ATORVASTATIN CALCIUM 40 MG PO TABS: 40.0000 mg | ORAL_TABLET | Freq: Every day | ORAL | Status: DC

## 2015-06-30 MED ORDER — METFORMIN HCL 500 MG PO TABS: 1000.0000 mg | ORAL_TABLET | Freq: Two times a day (BID) | ORAL | Status: DC

## 2015-06-30 MED ORDER — ONDANSETRON HCL 4 MG/2ML IJ SOLN: 4.0000 mg | Freq: Four times a day (QID) | INTRAMUSCULAR | Status: DC | PRN

## 2015-06-30 MED ORDER — INSULIN GLARGINE 100 UNIT/ML ~~LOC~~ SOLN: 10.0000 [IU] | Freq: Every day | SUBCUTANEOUS | Status: DC | PRN

## 2015-06-30 MED ORDER — IPRATROPIUM-ALBUTEROL 18-103 MCG/ACT IN AERO
1.0000 | INHALATION_SPRAY | Freq: Four times a day (QID) | RESPIRATORY_TRACT | Status: DC | PRN
Start: 2015-06-30 — End: 2015-06-30

## 2015-06-30 MED ORDER — LISINOPRIL 20 MG PO TABS
20.0000 mg | ORAL_TABLET | Freq: Every day | ORAL | Status: DC
  Administered 2015-06-30: 20 mg via ORAL
  Filled 2015-06-30: qty 1

## 2015-06-30 MED ORDER — TECHNETIUM TC 99M SESTAMIBI GENERIC - CARDIOLITE
10.0000 | Freq: Once | INTRAVENOUS | Status: AC | PRN
  Administered 2015-06-30: 10 via INTRAVENOUS

## 2015-06-30 MED ORDER — IPRATROPIUM-ALBUTEROL 0.5-2.5 (3) MG/3ML IN SOLN
3.0000 mL | Freq: Four times a day (QID) | RESPIRATORY_TRACT | Status: DC | PRN
  Administered 2015-06-30 (×2): 3 mL via RESPIRATORY_TRACT
  Filled 2015-06-30: qty 3

## 2015-06-30 MED ORDER — REGADENOSON 0.4 MG/5ML IV SOLN
INTRAVENOUS | Status: AC
  Administered 2015-06-30: 0.4 mg via INTRAVENOUS
  Filled 2015-06-30: qty 5

## 2015-06-30 MED ORDER — REGADENOSON 0.4 MG/5ML IV SOLN
0.4000 mg | Freq: Once | INTRAVENOUS | Status: AC
  Administered 2015-06-30: 0.4 mg via INTRAVENOUS
  Filled 2015-06-30: qty 5

## 2015-06-30 MED ORDER — FUROSEMIDE 40 MG PO TABS
40.0000 mg | ORAL_TABLET | Freq: Every day | ORAL | Status: DC
  Administered 2015-06-30: 40 mg via ORAL
  Filled 2015-06-30: qty 1

## 2015-06-30 MED ORDER — PANCRELIPASE (LIP-PROT-AMYL) 12000-38000 UNITS PO CPEP: 72000.0000 [IU] | ORAL_CAPSULE | Freq: Three times a day (TID) | ORAL | Status: DC

## 2015-06-30 MED ORDER — AMOXICILLIN-POT CLAVULANATE 500-125 MG PO TABS: 1.0000 | ORAL_TABLET | Freq: Two times a day (BID) | ORAL | Status: DC

## 2015-06-30 MED ORDER — TAMSULOSIN HCL 0.4 MG PO CAPS
0.4000 mg | ORAL_CAPSULE | Freq: Every day | ORAL | Status: DC
  Administered 2015-06-30: 0.4 mg via ORAL
  Filled 2015-06-30: qty 1

## 2015-06-30 MED ORDER — POTASSIUM CHLORIDE ER 10 MEQ PO TBCR
20.0000 meq | EXTENDED_RELEASE_TABLET | Freq: Two times a day (BID) | ORAL | Status: DC
  Administered 2015-06-30 (×2): 20 meq via ORAL
  Filled 2015-06-30 (×5): qty 2

## 2015-06-30 MED ORDER — HEPARIN (PORCINE) IN NACL 100-0.45 UNIT/ML-% IJ SOLN
1000.0000 [IU]/h | INTRAMUSCULAR | Status: DC
  Administered 2015-06-30: 1000 [IU]/h via INTRAVENOUS

## 2015-06-30 MED ORDER — METOPROLOL TARTRATE 25 MG PO TABS
25.0000 mg | ORAL_TABLET | Freq: Two times a day (BID) | ORAL | Status: DC
  Administered 2015-06-30 (×2): 25 mg via ORAL
  Filled 2015-06-30 (×2): qty 1

## 2015-06-30 MED ORDER — INSULIN GLARGINE 100 UNIT/ML ~~LOC~~ SOLN
10.0000 [IU] | Freq: Every morning | SUBCUTANEOUS | Status: DC
  Administered 2015-06-30: 10 [IU] via SUBCUTANEOUS
  Filled 2015-06-30: qty 0.1

## 2015-06-30 MED ORDER — LORATADINE 10 MG PO TABS
10.0000 mg | ORAL_TABLET | Freq: Every day | ORAL | Status: DC
  Filled 2015-06-30: qty 1

## 2015-06-30 MED ORDER — ALBUTEROL SULFATE (2.5 MG/3ML) 0.083% IN NEBU
2.5000 mg | INHALATION_SOLUTION | Freq: Four times a day (QID) | RESPIRATORY_TRACT | Status: DC
  Administered 2015-06-30 (×3): 2.5 mg via RESPIRATORY_TRACT
  Filled 2015-06-30 (×2): qty 3

## 2015-06-30 NOTE — Discharge Summary (Signed)
Discharge Summary   Patient ID: Stephen [name],  MRN: [id number], DOB/AGE: [birth year] 74 y.o.  Admit date: 06/29/2015 Discharge date: 06/30/2015  Primary Care Provider: [name] Primary Cardiologist: Dr. Bronson Ing  Discharge Diagnoses Principal Problem:   Precordial chest pain Active Problems:   COPD II/III still smoking    Coronary artery disease involving native coronary artery of native heart with other form of angina pectoris   Acute posthemorrhagic anemia   Allergies Allergies  Allergen Reactions  . Lovaza [Omega-3-Acid Ethyl Esters] Itching  . Lyrica [Pregabalin] Hives  . Zithromax [Azithromycin]     rash  . Levaquin [Levofloxacin] Hives    Procedures  Leane Call 06/30/2015  Study Result      There was no ST segment deviation noted during stress.  Findings consistent with prior myocardial infarction.  This is a high risk study.  Nuclear stress EF: 30%.  The left ventricular ejection fraction is moderately decreased (30-44%).  Large inferior wall infarct from apex to base no ischemia. EF 30% with diffuse hypokinesis      Hospital Course  The patient is a 74 year old male with past medical history of chronic left bundle branch block, bilateral AKA, hypertension, hyperlipidemia, PVD, COPD with ongoing tobacco abuse, and CAD s/p multiple PCI to LAD, RCA, ICM with baseline EF 30-35% and subsequent CABG 3 in June 2011. He presented to [facility] on 06/29/2015 with substernal chest pain started around 6:30 PM. He also has some back pain with this as well. It did improve after nitroglycerin at home, however the chest discomfort returned prompting the patient to seek medical attention. When he was seen in the ED, he was chest pain-free. He does not usually get around as he has bilateral AKA. He wears home O2 as needed, and still smokes. Given his multiple comorbidities, he was admitted to cardiology service. Initial troponin was mildly elevated  at 0.15, however this is in the setting of an anemia with hemoglobin 7.9. Patient denies any obvious bleeding. He does have chronic anemia with baseline 9.6-10.0. Although initial chest x-ray was concerning for heart failure, however he is euvolemic based on physical exam.  Given his past medical history, patient underwent Myoview in the morning of 06/30/2015. Myoview showed EF 30%, findings consistent with prior myocardial infarction with large inferior wall infarct from apex to base with no ischemia. He is considered stable for discharge from cardiology perspective. Repeat hemoglobin stable at 8.2. I have discussed with the Dr. Acie Fredrickson who recommended close outpatient follow-up with his PCP regarding anemia and likely need outpatient GI consult. Per patient, he has a scheduled follow-up and labs next week with his PCP. I have arranged cardiology follow-up within 2-4 weeks.   Discharge Vitals Blood pressure 134/54, pulse 67, temperature 98.3 F (36.8 C), temperature source Oral, resp. rate 17, height 5\' 10"  (1.778 m), weight 141 lb 1.5 oz (64 kg), SpO2 100 %.  Filed Weights   06/29/15 2200 06/30/15 0143  Weight: 151 lb 0.2 oz (68.5 kg) 141 lb 1.5 oz (64 kg)    Labs  CBC  Recent Labs  06/29/15 2200 06/30/15 0724  WBC 8.2 7.8  HGB 7.9* 8.2*  HCT 24.5* 25.2*  MCV 86.0 87.5  PLT 339 242   Basic Metabolic Panel  Recent Labs  06/29/15 2200 06/30/15 0724  NA 133* 135  K 2.9* 3.4*  CL 98* 100*  CO2 30 30  GLUCOSE 453* 140*  BUN 10 11  CREATININE 0.64 0.55*  CALCIUM 7.8*  7.9*   Liver Function Tests  Recent Labs  06/29/15 2200  AST 16  ALT 8*  ALKPHOS 52  BILITOT 0.1*  PROT 5.1*  ALBUMIN 1.8*   Fasting Lipid Panel  Recent Labs  06/30/15 0724  CHOL 112  HDL 60  LDLCALC 43  TRIG 44  CHOLHDL 1.9    Disposition  Pt is being discharged home today in good condition.  Follow-up Plans & Appointments      Follow-up Information    Follow up with [name],  PA-C.   Specialty:  General Practice   Why:  Please followup with your primary care physician as soon as possible for anemia and potentially obtain outpatient gastroenterology referral   Contact information:   [address] [place] [address] [phone]       Follow up with Jory Sims, NP On 07/17/2015.   Specialties:  Nurse Practitioner, Radiology, Cardiology   Why:  2:10pm   Contact information:   [place] [address] [phone]       Follow up with Ruta Hinds, MD On 07/13/2015.   Specialties:  Vascular Surgery, Cardiology   Why:  9:30am   Contact information:   [address] [place]  [address] [phone]       Discharge Medications    Medication List    STOP taking these medications        amoxicillin-clavulanate 500-125 MG per tablet  Commonly known as:  AUGMENTIN      TAKE these medications        albuterol-ipratropium 18-103 MCG/ACT inhaler  Commonly known as:  COMBIVENT  Inhale 1-2 puffs into the lungs every 6 (six) hours as needed for wheezing or shortness of breath.     aspirin 325 MG tablet  Take 325 mg by mouth daily.     atorvastatin 40 MG tablet  Commonly known as:  LIPITOR  Take 40 mg by mouth daily.     CREON 24000 UNITS Cpep  Generic drug:  Pancrelipase (Lip-Prot-Amyl)  Take 50,932-67,124 Units by mouth See admin instructions. Take 72000 units 3 times daily with all meals and take 48000 units with snacks     fenofibrate 160 MG tablet  Take 160 mg by mouth daily with breakfast.     fluconazole 50 MG tablet  Commonly known as:  DIFLUCAN  Take 1 tablet (50 mg total) by mouth daily.     furosemide 20 MG tablet  Commonly known as:  LASIX  Take 40 mg by mouth daily.     HYDROcodone-acetaminophen 5-325 MG per tablet  Commonly known as:  NORCO/VICODIN  Take 1 tablet by mouth 4 (four) times daily as needed for moderate pain.     insulin glargine 100 UNIT/ML injection  Commonly known as:  LANTUS  Inject 10  Units into the skin every morning.     levalbuterol 1.25 MG/3ML nebulizer solution  Commonly known as:  XOPENEX  Take 0.63 mg by nebulization every 4 (four) hours as needed for wheezing or shortness of breath.     lisinopril 20 MG tablet  Commonly known as:  PRINIVIL,ZESTRIL  Take 20 mg by mouth daily.     loratadine 10 MG tablet  Commonly known as:  CLARITIN  Take 10 mg by mouth daily.     magnesium oxide 400 MG tablet  Commonly known as:  MAG-OX  Take 1 tablet (400 mg total) by mouth daily.     metFORMIN 1000 MG tablet  Commonly known  as:  GLUCOPHAGE  Take 1,000 mg by mouth 2 (two) times daily with a meal.     metolazone 2.5 MG tablet  Commonly known as:  ZAROXOLYN  TAKE ONE (1) TABLET EACH DAY     metoprolol tartrate 25 MG tablet  Commonly known as:  LOPRESSOR  Take 25 mg by mouth 2 (two) times daily.     nitroGLYCERIN 0.4 MG SL tablet  Commonly known as:  NITROSTAT  Place 0.4 mg under the tongue every 5 (five) minutes x 3 doses as needed for chest pain.     OXYGEN  Inhale 3 L into the lungs daily as needed (shortess of breath).     Potassium Chloride ER 20 MEQ Tbcr  Take 1 tab twice daily for 14 days     SYMBICORT 160-4.5 MCG/ACT inhaler  Generic drug:  budesonide-formoterol  Inhale 1-2 puffs into the lungs 2 (two) times daily as needed (shorntess of breath).     tamsulosin 0.4 MG Caps capsule  Commonly known as:  FLOMAX  Take 0.4 mg by mouth at bedtime.     traZODone 50 MG tablet  Commonly known as:  DESYREL  Take 50 mg by mouth at bedtime as needed for sleep.        Duration of Discharge Encounter   Greater than 30 minutes including physician time.  Hilbert Corrigan PA-C Pager: [phone] 06/30/2015, 4:02 PM  Attending Note:   The patient was seen and examined.  Agree with assessment and plan as noted above.  Changes made to the above note as needed.  myoview study shows no ischemia. He has had an old MI.   I think his minimal troponin  elevation is due to stress from his anemia. He's currently not having any chest pain . He still smokes quite a bit.  I've advised him to stop since smoking will likely lead to further CAD .   Has had some GI issues and likely has a slow GI bleed.   This anemia needs further work up as OP .    Thayer Headings, Brooke Bonito., MD, [facility] 06/30/2015, 5:56 PM [address]  [place] Pager [phone]

## 2015-06-30 NOTE — Discharge Instructions (Signed)
You have anemia (low red blood cell count), please followup with your primary care physician to obtain outpatient GI referral and workup

## 2015-06-30 NOTE — Progress Notes (Signed)
1 day lexiscan stress test completed without significant complication. Pending final result by [place] reader  Signed, Almyra Deforest PA Pager: [phone]

## 2015-06-30 NOTE — Progress Notes (Signed)
Patient Name: Stephen [name] Date of Encounter: 06/30/2015  Primary Cardiologist Dr. Bronson Ing   Active Problems:   Precordial chest pain    SUBJECTIVE  Denies any CP, mild SOB with cough  CURRENT MEDS . albuterol  2.5 mg Nebulization Q6H  . albuterol      . aspirin  325 mg Oral Daily  . atorvastatin  40 mg Oral q1800  . fenofibrate  160 mg Oral Q breakfast  . fluconazole  50 mg Oral Daily  . furosemide  40 mg Oral Daily  . heparin  2,000 Units Intravenous Once  . insulin aspart  0-15 Units Subcutaneous TID WC  . lipase/protease/amylase  72,000 Units Oral TID WC  . lisinopril  20 mg Oral Daily  . loratadine  10 mg Oral Daily  . magnesium oxide  400 mg Oral Daily  . metFORMIN  1,000 mg Oral BID WC  . metolazone  2.5 mg Oral Daily  . metoprolol tartrate  25 mg Oral BID  . potassium chloride  20 mEq Oral BID  . tamsulosin  0.4 mg Oral QHS    OBJECTIVE  Filed Vitals:   06/30/15 0439 06/30/15 0608 06/30/15 0800 06/30/15 0838  BP:  116/51 122/51   Pulse:  63 72   Temp:  97.9 F (36.6 C) 98.2 F (36.8 C)   TempSrc:  Oral Oral   Resp:  18 17   Height:      Weight:      SpO2: 100% 100% 95% 95%    Intake/Output Summary (Last 24 hours) at 06/30/15 1010 Last data filed at 06/30/15 0843  Gross per 24 hour  Intake 101.66 ml  Output    695 ml  Net -593.34 ml   Filed Weights   06/29/15 2200 06/30/15 0143  Weight: 68.5 kg (151 lb 0.2 oz) 64 kg (141 lb 1.5 oz)    PHYSICAL EXAM  General: Pleasant, NAD. Neuro: Alert and oriented X 3. Moves all extremities spontaneously. Psych: Normal affect. HEENT:  Normal  Neck: Supple without bruits or JVD. Lungs:  Resp regular and unlabored. Bilateral rhonchi Heart: RRR no s3, s4, or murmurs. Abdomen: Soft, non-tender, non-distended, BS + x 4.  Extremities: No clubbing, cyanosis or edema. Bilateral AKA  Accessory Clinical Findings  CBC  Recent Labs  06/29/15 2200 06/30/15 0724  WBC 8.2 7.8  HGB 7.9* 8.2*  HCT  24.5* 25.2*  MCV 86.0 87.5  PLT 339 237   Basic Metabolic Panel  Recent Labs  06/29/15 2200 06/30/15 0724  NA 133* 135  K 2.9* 3.4*  CL 98* 100*  CO2 30 30  GLUCOSE 453* 140*  BUN 10 11  CREATININE 0.64 0.55*  CALCIUM 7.8* 7.9*   Liver Function Tests  Recent Labs  06/29/15 2200  AST 16  ALT 8*  ALKPHOS 52  BILITOT 0.1*  PROT 5.1*  ALBUMIN 1.8*   Fasting Lipid Panel  Recent Labs  06/30/15 0724  CHOL 112  HDL 60  LDLCALC 43  TRIG 44  CHOLHDL 1.9    TELE  NSR without significant ventricular ectopy  Radiology/Studies  Dg Chest Port 1 View  06/29/2015   CLINICAL DATA:  Chest pain and shortness of breath today.  EXAM: PORTABLE CHEST - 1 VIEW  COMPARISON:  05/28/2015  FINDINGS: Patient is post median sternotomy, lower most sternal wire remains broken, unchanged. Cardiomediastinal contours are unchanged, heart at the upper limits normal in size. Increasing left pleural effusion with retrocardiac opacity. Worsening right basilar atelectasis.  Mild vascular congestion without overt edema. No pneumothorax.  IMPRESSION: Worsening bibasilar aeration with increasing left pleural effusion a retrocardiac opacity and progressive right basilar atelectasis. Mild vascular congestion without overt edema. Findings may reflect fluid overload/CHF. Aspiration or pneumonia could have a similar appearance.   Electronically Signed   By: Jeb Levering M.D.   On: 06/29/2015 22:10    ASSESSMENT AND PLAN  Stephen [name] is a 74 y.o. male with a history of CAD s/p CABG, chronic systolic CHF (EF 18-84%), mod MR, chronic LBBB presented with chest pain.   1. Chest pain: some typical and atypical features.   - trop 0.15 in the setting significant anemia, no second trop  - some bilateral rhonchi this morning, chronic smoker, discussed with Dr. Acie Fredrickson will go ahead with [facility] today. Likely need breathing treatment afterward  2. Chronic systolic HF: euvolemic  3. Hyponatremia: free water  restriction   4. Anemia: hgb around 8  5. Tobacco abuse: some rhonchi bilaterally today  6. HLD  7. DM   Signed, Almyra Deforest PA Pager: [phone]  Attending Note:   The patient was seen and examined.  Agree with assessment and plan as noted above.  Changes made to the above note as needed.  Pt feels better - not having any pain at present   1. CP - myoview study perfomred Ive advised him to stop smoking .  2. Anemia.  The nursing student said that the patient had a bowel movement while in nuc med and it had some reddish streaks.  He may be having a slow GI bleed  Will need to guiac stools    Thayer Headings, Brooke Bonito., MD, [facility] 06/30/2015, 2:04 PM [address]  [place] Pager [phone]

## 2015-06-30 NOTE — Progress Notes (Signed)
Inpatient Diabetes Program Recommendations  AACE/ADA: New Consensus Statement on Inpatient Glycemic Control (2013)  Target Ranges:  Prepandial:   less than 140 mg/dL      Peak postprandial:   less than 180 mg/dL (1-2 hours)      Critically ill patients:  140 - 180 mg/dL   Results for Stephen [name], Stephen [name] (MRN [id number]) as of 06/30/2015 09:17  Ref. Range 06/29/2015 22:00 06/30/2015 07:24  Glucose Latest Ref Range: 65-99 mg/dL 453 (H) 140 (H)    Outpatient Diabetes medications: Lantus 10-30 units daily as needed, Novolog 0-15 units TID with meals, Metformin 1000 mg BID Current orders for Inpatient glycemic control: Lantus 10-30 units daily PRN, Novolog 0-15 units TID with meals, Metformin 1000 mg BID  Inpatient Diabetes Program Recommendations Insulin - Basal: Currently there is an order for Lantus 10-30 units daily PRN with no specific parameters for dosing. Please discontinue Lantus PRN order and consider ordering Lantus 10 units daily starting now. Correction (SSI): Please consider adding Novolog bedtime correction scale. Insulin - Meal Coverage: Once diet is resumed, patient will likely need Novolog meal coverage (in addition to Novolog correction).  Thanks, Barnie Alderman, RN, MSN, CCRN, CDE Diabetes Coordinator Inpatient Diabetes Program [phone] (Team Pager from [place] to [place]) [phone] ([facility] office) [phone] [facility] office) [phone] [facility] office)

## 2015-07-03 ENCOUNTER — Telehealth: Payer: Self-pay | Admitting: Cardiology

## 2015-07-03 NOTE — Telephone Encounter (Signed)
D/C phone call .[name] Apt on 07/17/15 at: 2:10pm w/ Jory Sims PA at the [facility] office   Thanks

## 2015-07-03 NOTE — Telephone Encounter (Signed)
Patient contacted regarding discharge from [facility] on 06/30/15.  Patient understands to follow up with provider - was denoted below - patient asks that this appointment be cancelled. He reports he cannot make this appmt. He has no ride. Asked if I could reschedule and he declined. Asked if he would call back to reschedule and he did not seem like this would be doable r/t transportation issues.   Patient understands discharge instructions? YES  Patient understands medications and regiment? YES   Patient understands to bring all medications to this visit? n/a

## 2015-07-11 ENCOUNTER — Encounter: Payer: Self-pay | Admitting: Vascular Surgery

## 2015-07-12 ENCOUNTER — Ambulatory Visit: Payer: Medicare Other | Admitting: Vascular Surgery

## 2015-07-12 ENCOUNTER — Encounter (HOSPITAL_COMMUNITY): Payer: Medicare Other

## 2015-07-13 ENCOUNTER — Encounter (HOSPITAL_COMMUNITY): Payer: Medicare Other

## 2015-07-13 ENCOUNTER — Ambulatory Visit: Payer: Medicare Other | Admitting: Vascular Surgery

## 2015-07-13 ENCOUNTER — Ambulatory Visit (HOSPITAL_COMMUNITY)
Admission: RE | Admit: 2015-07-13 | Discharge: 2015-07-13 | Disposition: A | Discharge status: Home / Self Care | Payer: Medicare Other | Source: Ambulatory Visit | Attending: Vascular Surgery | Admitting: Vascular Surgery

## 2015-07-13 DIAGNOSIS — I739 Peripheral vascular disease, unspecified: Secondary | ICD-10-CM

## 2015-07-17 ENCOUNTER — Encounter: Payer: Medicare Other | Admitting: Adult Health

## 2015-07-25 ENCOUNTER — Encounter: Payer: Self-pay | Admitting: Internal Medicine

## 2015-08-21 DIAGNOSIS — J449 Chronic obstructive pulmonary disease, unspecified: Secondary | ICD-10-CM | POA: Insufficient documentation

## 2015-08-21 DIAGNOSIS — Z720 Tobacco use: Secondary | ICD-10-CM | POA: Insufficient documentation

## 2015-08-21 DIAGNOSIS — I5022 Chronic systolic (congestive) heart failure: Secondary | ICD-10-CM | POA: Insufficient documentation

## 2015-08-21 DIAGNOSIS — Z951 Presence of aortocoronary bypass graft: Secondary | ICD-10-CM | POA: Insufficient documentation

## 2015-08-21 DIAGNOSIS — Z89611 Acquired absence of right leg above knee: Secondary | ICD-10-CM | POA: Insufficient documentation

## 2015-08-22 ENCOUNTER — Telehealth: Payer: Self-pay | Admitting: Internal Medicine

## 2015-08-22 NOTE — Telephone Encounter (Signed)
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 I did not see where he had been scheduled, but he was mailed a recall letter in Sept to follow up. I told him that I would let the nurse/RMR be aware.

## 2015-08-22 NOTE — Telephone Encounter (Signed)
Pt is due for an office visit. Please schedule him.

## 2015-08-22 NOTE — Telephone Encounter (Signed)
Pt is refusing OV

## 2015-10-06 ENCOUNTER — Other Ambulatory Visit: Payer: Self-pay | Admitting: Physician Assistant

## 2015-10-06 DIAGNOSIS — J90 Pleural effusion, not elsewhere classified: Secondary | ICD-10-CM

## 2015-10-06 IMAGING — CR DG CHEST 1V PORT
1 series · 1 of 1 positions shown · non-contrast
Comparison: 05/28/2015

CLINICAL DATA: Chest pain and shortness of breath today.

EXAM:
PORTABLE CHEST - 1 VIEW

[AP]
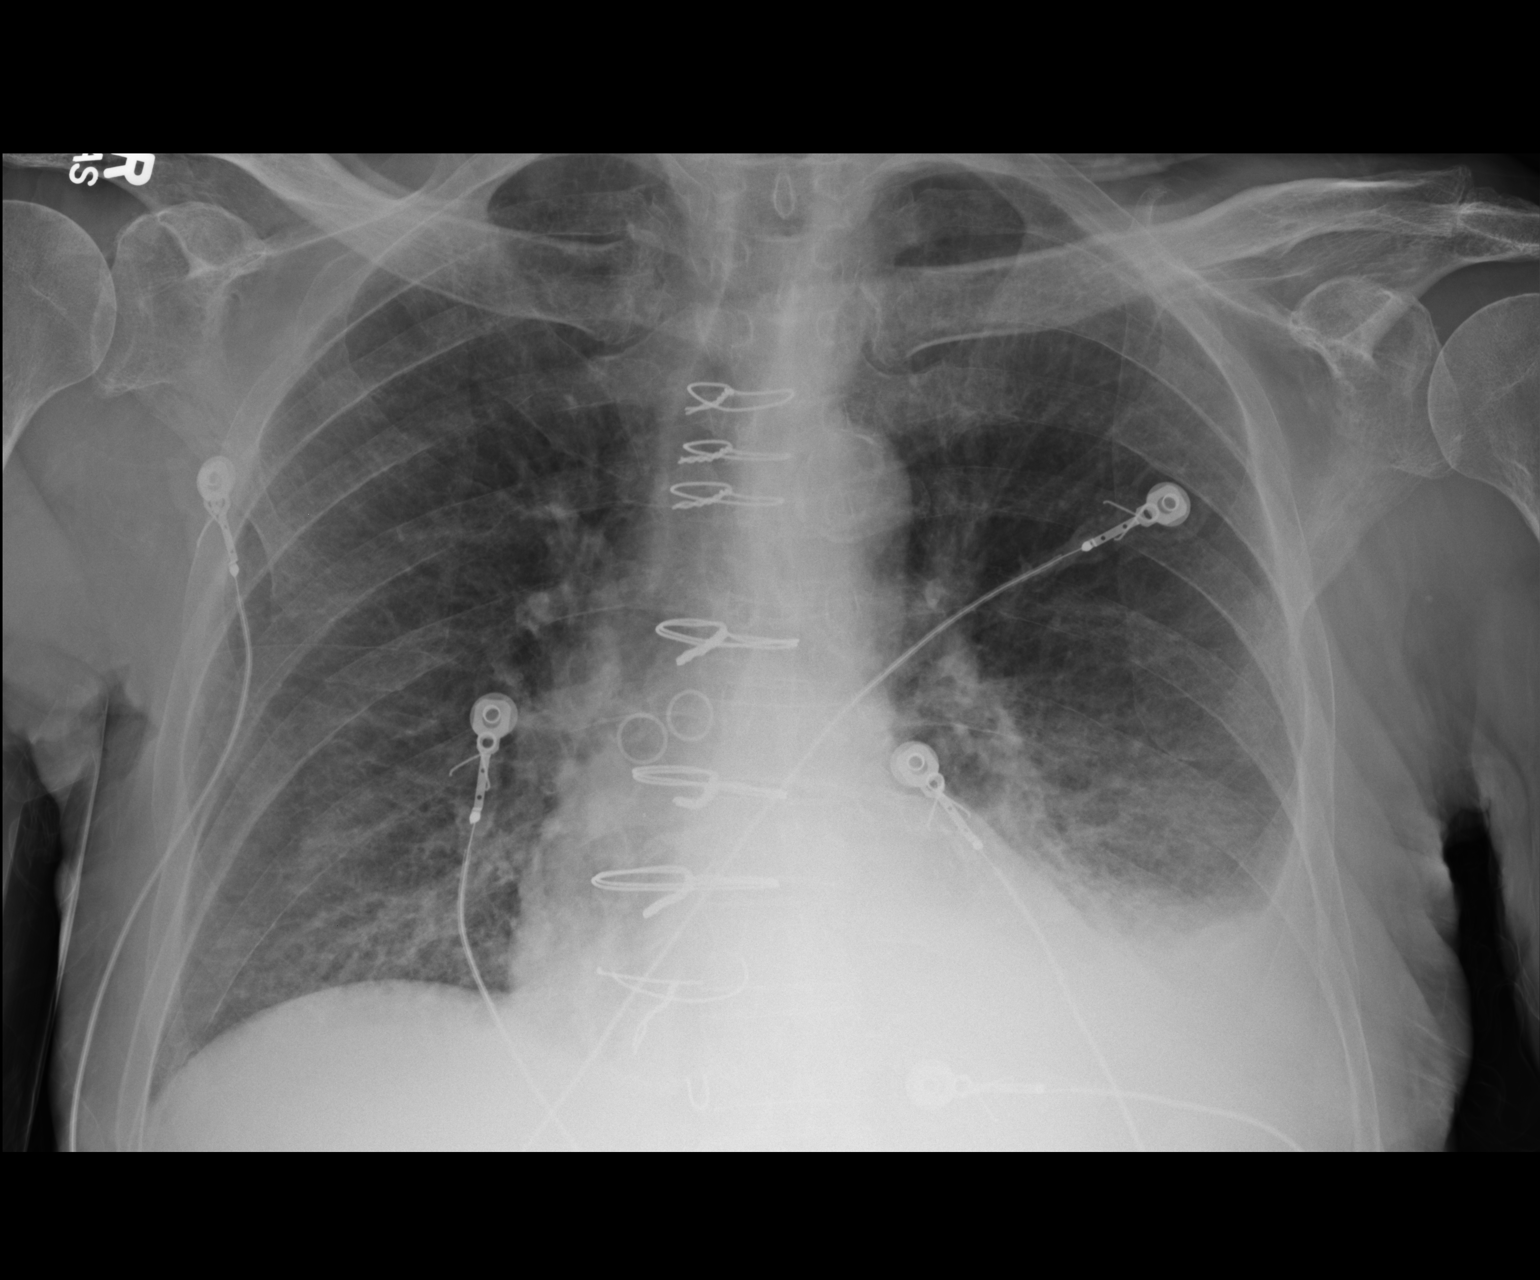

[1 of 1 positions shown; findings below may reference images not displayed]

FINDINGS: Patient is post median sternotomy, lower most sternal wire remains
broken, unchanged. Cardiomediastinal contours are unchanged, heart
at the upper limits normal in size. Increasing left pleural effusion
with retrocardiac opacity. Worsening right basilar atelectasis. Mild
vascular congestion without overt edema. No pneumothorax.
IMPRESSION: Worsening bibasilar aeration with increasing left pleural effusion a
retrocardiac opacity and progressive right basilar atelectasis. Mild
vascular congestion without overt edema. Findings may reflect fluid
overload/CHF. Aspiration or pneumonia could have a similar
appearance.

## 2015-10-12 ENCOUNTER — Ambulatory Visit
Admission: RE | Admit: 2015-10-12 | Discharge: 2015-10-12 | Disposition: A | Discharge status: Home / Self Care | Payer: Medicare Other | Source: Ambulatory Visit | Attending: Physician Assistant | Admitting: Physician Assistant

## 2015-10-12 DIAGNOSIS — J90 Pleural effusion, not elsewhere classified: Secondary | ICD-10-CM | POA: Insufficient documentation

## 2015-10-12 NOTE — Consult Note (Signed)
Chief Complaint: Patient was seen in consultation today for percutaneous management of recurrence and symptomatic pleural effusion at the request of Jones,Angel S  Referring Physician(s): Jones,Angel S  History of Present Illness: Stephen [name] is a 74 y.o. male with multiple medical problems including severe peripheral vascular disease (post bilateral above-knee amputation), hypertension, hyperlipidemia, chronic pancreatitis, CAD (post CABG), history of alcoholism and current smoker who presents to the interventional radiology clinic for potential percutaneous management options regarding recurrent symptomatic left sided pleural effusion. The patient is accompanied by his son though serves as his own historian. Note, neither the patient nor the patient's well son appear well educated/informed regarding the patient's multiple medical issues.  The patient has undergone 2 ultrasound-guided thoracenteses (both of which were performed at [facility]) with initial thoracentesis performed 07/26/2015 yielding 1.4 L and most recent thoracentesis performed 09/14/2015 yielding 1.9 L.  The patient reports subjective improvement in his baseline shortness of breath lasting approximately 2-3 weeks following each thoracentesis. Note, chest radiograph have also demonstrated small to moderate-sized right-sided pleural effusions though both of these effusions appear to be new since approximately July of this year.  The patient reports no additional change in his baseline state of poor health. He has chronic shortness of breath for which he is on 3 L of oxygen via nasal cannula continuously. He is confined to a wheelchair. He is unable to transfer from the wheelchair independently. Patient denies chest pain, fever or chills.  Thepatient Past Medical History  Diagnosis Date  . Hypertension   . Pancreatitis   . Neuropathy ([place])   . Hyperlipemia   . Peripheral vascular disease ([place])     a. 10/2013:  severely abnormal ABIs, for OP evaluation.  [name] GERD (gastroesophageal reflux disease)   . PUD (peptic ulcer disease)     diagnosed via EGD by Dr. Braulio Bosch at [facility]  . Diverticulosis   . Hemorrhoids   . Coronary artery disease      a. BMS to LAD 1999. b. HSRA to LAD and stent to RCA 2007. c. NSTEMI/CABG x 3 in 04/2010. d. NSTEMI 10/2013 - secondary to demand ischemia in setting of COPD exacerbation with severe underlying CAD - cath with 3/3 patent grafts, for medical therapy.   [name] COPD (chronic obstructive pulmonary disease) ([place])     a. Ongoing tobacco abuse.  . Arthritis   . Neutropenia ([place])     a. Dx 10/2013 on labs - instructed to f/u PCP.  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   . Ischemic cardiomyopathy     a. previously EF 30% in 2011. b. 10/2013: EF 45-50% by echo.  . Alcoholism ([place])     a. Remote alcoholism  . 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  [name] Ulcer of right foot ([place])   . Chronic kidney disease   . Sinusitis   . History of hiatal hernia   . Pneumonia     "a couple times; years ago' (05/03/2015)  . Type II diabetes mellitus ([place])   . Anemia     Past Surgical History  Procedure Laterality Date  . Coronary artery bypass graft  June 2011    X3  . Inguinal hernia repair Right   . Cholecystectomy      3-4 years ago  . Appendectomy  ~ 1962  . Cataract  extraction w/ intraocular lens implant Right   . Colonoscopy  05/12/2002    Dr. [name]- diverticulosis, anal papilla, internal hemorrhoids, rectal polyps  . [name] dilation  04/16/2012    Schatzki's ring-status post dilation and disruption/ as described above. Grade 1 esophageal varices. Small hiatal hernia.  [name] dilation  04/16/2012    Procedure: [name] DILATION;  Surgeon: Daneil Dolin, MD;  Location: [facility] END[facility];  Service: Endoscopy;  Laterality: N/A;  . Colonoscopy  08/10/2012     Procedure: COLONOSCOPY;  Surgeon: Daneil Dolin, MD;  Location: [facility];  Service: Endoscopy;  Laterality: N/A;  9:45 needs 30 mins extra /have Glucagon on hand  . Left heart catheterization with coronary/graft angiogram N/A 10/29/2013    Procedure: LEFT HEART CATHETERIZATION WITH [name];  Surgeon: Burnell Blanks, MD;  Location: [facility] CATH LAB;  Service: Cardiovascular;  Laterality: N/A;  . Abdominal aortagram N/A 09/09/2014    Procedure: ABDOMINAL Maxcine Ham;  Surgeon: Elam Dutch, MD;  Location: [facility] CATH LAB;  Service: Cardiovascular;  Laterality: N/A;  . Atherectomy Bilateral 09/23/2014    Procedure: ATHERECTOMY;  Surgeon: Elam Dutch, MD;  Location[facility] CATH LAB;  Service: Cardiovascular;  Laterality: Bilateral;  iliacs  . Amputation Left 12/05/2014    Procedure: AMPUTATION ABOVE KNEE;  Surgeon: Elam Dutch, MD;  Location: [place];  Service: Vascular;  Laterality: Left;  [name] Tympanoplasty Right   . 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 Esophagogastroduodenoscopy endoscopy  2003    Dr. Braulio Bosch: erosive reflux esophagitis , PUD  . Colonoscopy  05/2002    hyperplastic polyp, diverticulosis, anal papilla and internal hemorrhoids r  . Amputation Right 05/03/2015    Procedure: RIGHT ABOVE KNEE AMPUTATION;  Surgeon: Elam Dutch, MD;  Location: [place];  Service: Vascular;  Laterality: Right;    Allergies: Levaquin; Lovaza; Lyrica; and Zithromax  Medications: Prior to Admission medications   Medication Sig Start Date End Date Taking? Authorizing Provider  albuterol-ipratropium (COMBIVENT) 18-103 MCG/ACT inhaler Inhale 1-2 puffs into the lungs every 6 (six) hours as needed for wheezing or shortness of breath. 11/02/13  Yes Dayna N Dunn, PA-C  aspirin 325 MG tablet Take 325 mg by mouth daily.   Yes Historical Provider, MD  atorvastatin (LIPITOR) 40 MG tablet Take 40 mg by mouth daily.   Yes Historical Provider, MD  fenofibrate 160 MG tablet Take  160 mg by mouth daily with breakfast.    Yes Historical Provider, MD  fluconazole (DIFLUCAN) 50 MG tablet Take 1 tablet (50 mg total) by mouth daily. 05/30/15  Yes Lezlie Octave Black, NP  furosemide (LASIX) 20 MG tablet Take 40 mg by mouth daily.   Yes Historical Provider, MD  HYDROcodone-acetaminophen (NORCO/VICODIN) 5-325 MG per tablet Take 1 tablet by mouth 4 (four) times daily as needed for moderate pain. 05/23/15  Yes Costin Karlyne Greenspan, MD  insulin glargine (LANTUS) 100 UNIT/ML injection Inject 10 Units into the skin every morning.   Yes Historical Provider, MD  levalbuterol Penne Lash) 1.25 MG/3ML nebulizer solution Take 0.63 mg by nebulization every 4 (four) hours as needed for wheezing or shortness of breath. 05/30/15  Yes Lezlie Octave Black, NP  lisinopril (PRINIVIL,ZESTRIL) 20 MG tablet Take 20 mg by mouth daily.    Yes Historical Provider, MD  loratadine (CLARITIN) 10 MG tablet Take 10 mg by mouth daily.   Yes Historical Provider, MD  magnesium oxide (MAG-OX) 400 MG tablet Take 1 tablet (400 mg total) by mouth daily.  05/30/15  Yes Lezlie Octave Black, NP  metFORMIN (GLUCOPHAGE) 1000 MG tablet Take 1,000 mg by mouth 2 (two) times daily with a meal.    Yes Historical Provider, MD  metolazone (ZAROXOLYN) 2.5 MG tablet TAKE ONE (1) TABLET EACH DAY 10/03/14  Yes Lendon Colonel, NP  metoprolol tartrate (LOPRESSOR) 25 MG tablet Take 25 mg by mouth 2 (two) times daily.    Yes Historical Provider, MD  nitroGLYCERIN (NITROSTAT) 0.4 MG SL tablet Place 0.4 mg under the tongue every 5 (five) minutes x 3 doses as needed for chest pain.    Yes Historical Provider, MD  OXYGEN Inhale 3 L into the lungs daily as needed (shortess of breath).   Yes Historical Provider, MD  Pancrelipase, Lip-Prot-Amyl, (CREON) 24000 UNITS CPEP Take 48,000-72,000 Units by mouth See admin instructions. Take 72000 units 3 times daily with all meals and take 48000 units with snacks   Yes Historical Provider, MD  potassium chloride 20 MEQ TBCR Take 1  tab twice daily for 14 days Patient taking differently: Take 20 mEq by mouth 2 (two) times daily.  05/30/15  Yes Radene Gunning, NP  SYMBICORT 160-4.5 MCG/ACT inhaler Inhale 1-2 puffs into the lungs 2 (two) times daily as needed (shorntess of breath).  07/04/12  Yes Historical Provider, MD  traZODone (DESYREL) 50 MG tablet Take 50 mg by mouth at bedtime as needed for sleep.    Yes Historical Provider, MD  citalopram (CELEXA) 20 MG tablet Take 20 mg by mouth.    Historical Provider, MD  gabapentin (NEURONTIN) 300 MG capsule Take 300 mg by mouth.    Historical Provider, MD  tamsulosin (FLOMAX) 0.4 MG CAPS capsule Take 0.4 mg by mouth at bedtime.    Historical Provider, MD     Family History  Problem Relation Age of Onset  . Coronary artery disease Mother   . Coronary artery disease Father   . Diabetes Sister   . Suicidality Brother   . Cirrhosis Brother   . Colon cancer Neg Hx     Social History   Social History  . Marital Status: Married    Spouse Name: N/A  . Number of Children: N/A  . Years of Education: N/A   Occupational History  . Disabled    Social History Main Topics  . Smoking status: Current Every Day Smoker -- 1.00 packs/day for 65 years    Types: Cigarettes  . Smokeless tobacco: Former Systems developer    Types: Snuff     Comment: 05/03/2015 "dipped snuff years and years ago"  . Alcohol Use: No     Comment: Quit drinking in 1985  . Drug Use: No  . Sexual Activity: No   Other Topics Concern  . None   Social History Narrative   Lives at home with wife and son.      ECOG Status: 2 - Symptomatic, <50% confined to bed  Review of Systems: A 12 point ROS discussed and pertinent positives are indicated in the HPI above.  All other systems are negative.  Review of Systems  Constitutional: Negative for fever and fatigue.  Respiratory: Positive for cough, shortness of breath, wheezing and stridor.   Cardiovascular: Negative for chest pain and palpitations.  Gastrointestinal:  Negative for constipation.    Vital Signs: BP 152/75 mmHg  Pulse 84  Temp(Src) 97.7 F (36.5 C)  Resp 20  SpO2 99%  Physical Exam  Constitutional: No distress.  Cachectic appearing male who appears older than his stated age.  Cardiovascular: Normal rate and regular rhythm.   Pulmonary/Chest: He is in respiratory distress. He has wheezes. He has rales.  Musculoskeletal:  The patient is a bilateral above-knee amputee.  Skin: Skin is warm and dry.  Multiple bruises are noted overlying the patient's bilateral upper extremities.  Psychiatric: He has a normal mood and affect. His behavior is normal.    Imaging:  Chest radiograph - 10/05/2015; 10/03/2015; 09/14/2015; 09/13/2015; 07/26/2015; ultrasound guided thoracentesis - 09/14/2015; 07/26/2015; CT abdomen and pelvis - 01/14/2015  Labs:  CBC:  Recent Labs  05/30/15 0549 06/05/15 2130 06/29/15 2200 06/30/15 0724  WBC 5.8 6.7 8.2 7.8  HGB 9.6* 9.7* 7.9* 8.2*  HCT 29.2* 30.4* 24.5* 25.2*  PLT 441* 488* 339 304    COAGS:  Recent Labs  12/03/14 1710 12/08/14 0248  INR 0.97 1.08  APTT  --  49*    BMP:  Recent Labs  05/30/15 0549 06/05/15 2130 06/29/15 2200 06/30/15 0724  NA 133* 127* 133* 135  K 3.2* 4.4 2.9* 3.4*  CL 91* 90* 98* 100*  CO2 34* 30 30 30   GLUCOSE 216* 515* 453* 140*  BUN 9 12 10 11   CALCIUM 8.2* 8.4* 7.8* 7.9*  CREATININE 0.62 0.86 0.64 0.55*  GFRNONAA >60 >60 >60 >60  GFRAA >60 >60 >60 >60    LIVER FUNCTION TESTS:  Recent Labs  05/28/15 1038 05/29/15 0625 06/05/15 2130 06/29/15 2200  BILITOT 0.3 0.3 0.2* 0.1*  AST 16 15 13* 16  ALT 8* 9* 8* 8*  ALKPHOS 70 68 81 52  PROT 5.7* 5.7* 6.6 5.1*  ALBUMIN 1.8* 1.8* 2.4* 1.8*    Assessment and Plan:  Stephen [name] is a 74 y.o. male with multiple medical problems including severe peripheral vascular disease (post bilateral above-knee amputation), hypertension, hyperlipidemia, chronic pancreatitis, CAD (post CABG), history of alcoholism  and current smoker who presents to the interventional radiology clinic for potential percutaneous management options regarding recurrent symptomatic left sided pleural effusion.  The patient has only undergone 2 ultrasound-guided thoracenteses (both of which were performed at [facility]) with initial thoracentesis performed 07/26/2015 yielding 1.4 L and most recent thoracentesis performed 09/14/2015 yielding 1.9 L.    Unfortunately, given the patient's underlying severe emphysema, he is not a candidate for talc pleurodesis, as I am concerned this would result in worsening lung function.  Given the patient's overall extremely poor state of health, he MAY be a candidate for a palliative tunneled pleural drainage catheter, however prior to proceeding with this palliative procedure, I would recommend the patient is optimized from a medical standpoint in regard to optimizing his inotropic and diuretic therapies as per cardiology's recommendation.   Note placement of a tunneled pleural drainage catheter is typically only considered in the setting of palliative purposes, commonly when a patient is considering hospice care.  As such, I will discuss with the patient's medical management and ultimately goals of care with Dr. Ronnald Ramp prior to proceeding with placement of a palliative tunneled pleural drainage catheter.    In the meantime, patient may have ultrasound-guided thoracentesis performed based on symptomology as quickly indicated.  Thank you for this interesting consult.  I greatly enjoyed meeting Stephen [name] and look forward to participating in his care.  A copy of this report was sent to the requesting provider on this date.  SignedSandi Mariscal 10/12/2015, 11:27 AM   I spent a total of 30 Minutes in face to face in clinical consultation, greater than 50% of which was  counseling/coordinating care for percutaneous management of recurrent symptomatic pleural effusion.

## 2015-12-06 DEATH — deceased
